# Patient Record
Sex: Female | Born: 1960
Health system: Southern US, Community
[De-identification: ages and names within clinical notes are randomized; demographics above are authoritative.]

## PROBLEM LIST (undated history)

## (undated) ENCOUNTER — Emergency Department (HOSPITAL_COMMUNITY): Source: Home / Self Care

## (undated) DIAGNOSIS — T7840XA Allergy, unspecified, initial encounter: Secondary | ICD-10-CM

## (undated) DIAGNOSIS — O2230 Deep phlebothrombosis in pregnancy, unspecified trimester: Secondary | ICD-10-CM

## (undated) DIAGNOSIS — F419 Anxiety disorder, unspecified: Secondary | ICD-10-CM

## (undated) DIAGNOSIS — G43909 Migraine, unspecified, not intractable, without status migrainosus: Secondary | ICD-10-CM

## (undated) DIAGNOSIS — R251 Tremor, unspecified: Secondary | ICD-10-CM

## (undated) DIAGNOSIS — F319 Bipolar disorder, unspecified: Secondary | ICD-10-CM

## (undated) DIAGNOSIS — I4891 Unspecified atrial fibrillation: Secondary | ICD-10-CM

## (undated) DIAGNOSIS — K219 Gastro-esophageal reflux disease without esophagitis: Secondary | ICD-10-CM

## (undated) DIAGNOSIS — R58 Hemorrhage, not elsewhere classified: Secondary | ICD-10-CM

## (undated) DIAGNOSIS — E785 Hyperlipidemia, unspecified: Secondary | ICD-10-CM

## (undated) DIAGNOSIS — I82409 Acute embolism and thrombosis of unspecified deep veins of unspecified lower extremity: Secondary | ICD-10-CM

## (undated) DIAGNOSIS — D571 Sickle-cell disease without crisis: Secondary | ICD-10-CM

## (undated) DIAGNOSIS — F32A Depression, unspecified: Secondary | ICD-10-CM

## (undated) HISTORY — DX: Unspecified atrial fibrillation: I48.91

## (undated) HISTORY — PX: OVARIAN CYST REMOVAL: SHX89

## (undated) HISTORY — DX: Bipolar disorder, unspecified: F31.9

## (undated) HISTORY — DX: Sickle-cell disease without crisis: D57.1

## (undated) HISTORY — DX: Hyperlipidemia, unspecified: E78.5

## (undated) HISTORY — DX: Tremor, unspecified: R25.1

## (undated) HISTORY — DX: Anxiety disorder, unspecified: F41.9

## (undated) HISTORY — PX: TONSILLECTOMY: SUR1361

## (undated) HISTORY — DX: Hemorrhage, not elsewhere classified: R58

## (undated) HISTORY — DX: Acute embolism and thrombosis of unspecified deep veins of unspecified lower extremity: I82.409

## (undated) HISTORY — DX: Gastro-esophageal reflux disease without esophagitis: K21.9

## (undated) HISTORY — DX: Allergy, unspecified, initial encounter: T78.40XA

## (undated) HISTORY — PX: CHOLECYSTECTOMY: SHX55

---

## 1999-03-13 DIAGNOSIS — I2699 Other pulmonary embolism without acute cor pulmonale: Secondary | ICD-10-CM

## 1999-03-13 HISTORY — DX: Other pulmonary embolism without acute cor pulmonale: I26.99

## 2003-12-14 DIAGNOSIS — M2240 Chondromalacia patellae, unspecified knee: Secondary | ICD-10-CM

## 2003-12-14 HISTORY — DX: Chondromalacia patellae, unspecified knee: M22.40

## 2006-05-01 DIAGNOSIS — D573 Sickle-cell trait: Secondary | ICD-10-CM | POA: Insufficient documentation

## 2008-02-03 DIAGNOSIS — Z7901 Long term (current) use of anticoagulants: Secondary | ICD-10-CM | POA: Insufficient documentation

## 2008-12-28 DIAGNOSIS — R45851 Suicidal ideations: Secondary | ICD-10-CM | POA: Insufficient documentation

## 2008-12-28 HISTORY — DX: Suicidal ideations: R45.851

## 2012-03-12 HISTORY — PX: CHOLECYSTECTOMY: SHX55

## 2012-09-11 DIAGNOSIS — Z9049 Acquired absence of other specified parts of digestive tract: Secondary | ICD-10-CM | POA: Insufficient documentation

## 2012-09-11 HISTORY — DX: Acquired absence of other specified parts of digestive tract: Z90.49

## 2013-03-12 LAB — HM HEPATITIS C SCREENING LAB: HM Hepatitis Screen: NEGATIVE

## 2014-08-18 DIAGNOSIS — G43719 Chronic migraine without aura, intractable, without status migrainosus: Secondary | ICD-10-CM | POA: Insufficient documentation

## 2016-06-06 DIAGNOSIS — K209 Esophagitis, unspecified without bleeding: Secondary | ICD-10-CM | POA: Insufficient documentation

## 2016-06-06 HISTORY — DX: Esophagitis, unspecified without bleeding: K20.90

## 2016-06-29 LAB — HM COLONOSCOPY

## 2019-01-13 ENCOUNTER — Other Ambulatory Visit: Payer: Self-pay | Admitting: Internal Medicine

## 2019-01-13 DIAGNOSIS — Z1231 Encounter for screening mammogram for malignant neoplasm of breast: Secondary | ICD-10-CM

## 2019-03-17 ENCOUNTER — Other Ambulatory Visit: Payer: Self-pay

## 2019-03-17 ENCOUNTER — Ambulatory Visit
Admission: RE | Admit: 2019-03-17 | Discharge: 2019-03-17 | Disposition: A | Discharge status: Home / Self Care | Payer: Medicare HMO | Source: Ambulatory Visit | Attending: Internal Medicine | Admitting: Internal Medicine

## 2019-03-17 DIAGNOSIS — Z1231 Encounter for screening mammogram for malignant neoplasm of breast: Secondary | ICD-10-CM

## 2019-03-18 DIAGNOSIS — G43919 Migraine, unspecified, intractable, without status migrainosus: Secondary | ICD-10-CM | POA: Diagnosis not present

## 2019-03-19 ENCOUNTER — Ambulatory Visit: Payer: Medicare HMO | Admitting: Neurology

## 2019-03-19 ENCOUNTER — Ambulatory Visit: Payer: Medicare PPO | Admitting: Neurology

## 2019-03-19 DIAGNOSIS — F4312 Post-traumatic stress disorder, chronic: Secondary | ICD-10-CM | POA: Diagnosis not present

## 2019-03-19 DIAGNOSIS — F3181 Bipolar II disorder: Secondary | ICD-10-CM | POA: Diagnosis not present

## 2019-04-06 DIAGNOSIS — L814 Other melanin hyperpigmentation: Secondary | ICD-10-CM | POA: Diagnosis not present

## 2019-04-06 DIAGNOSIS — L648 Other androgenic alopecia: Secondary | ICD-10-CM | POA: Diagnosis not present

## 2019-04-16 DIAGNOSIS — G43109 Migraine with aura, not intractable, without status migrainosus: Secondary | ICD-10-CM | POA: Diagnosis not present

## 2019-04-16 DIAGNOSIS — F3181 Bipolar II disorder: Secondary | ICD-10-CM | POA: Diagnosis not present

## 2019-04-16 DIAGNOSIS — F4312 Post-traumatic stress disorder, chronic: Secondary | ICD-10-CM | POA: Diagnosis not present

## 2019-04-27 ENCOUNTER — Ambulatory Visit: Payer: Medicare HMO | Admitting: Neurology

## 2019-05-07 DIAGNOSIS — F3181 Bipolar II disorder: Secondary | ICD-10-CM | POA: Diagnosis not present

## 2019-05-07 DIAGNOSIS — F4312 Post-traumatic stress disorder, chronic: Secondary | ICD-10-CM | POA: Diagnosis not present

## 2019-05-12 DIAGNOSIS — Z20828 Contact with and (suspected) exposure to other viral communicable diseases: Secondary | ICD-10-CM | POA: Diagnosis not present

## 2019-05-20 ENCOUNTER — Ambulatory Visit: Payer: Medicare HMO | Admitting: Neurology

## 2019-05-21 DIAGNOSIS — G43109 Migraine with aura, not intractable, without status migrainosus: Secondary | ICD-10-CM | POA: Diagnosis not present

## 2019-05-21 DIAGNOSIS — F3181 Bipolar II disorder: Secondary | ICD-10-CM | POA: Diagnosis not present

## 2019-05-21 DIAGNOSIS — F4312 Post-traumatic stress disorder, chronic: Secondary | ICD-10-CM | POA: Diagnosis not present

## 2019-06-18 DIAGNOSIS — Z86711 Personal history of pulmonary embolism: Secondary | ICD-10-CM | POA: Diagnosis not present

## 2019-06-18 DIAGNOSIS — D573 Sickle-cell trait: Secondary | ICD-10-CM | POA: Diagnosis not present

## 2019-06-18 DIAGNOSIS — H43399 Other vitreous opacities, unspecified eye: Secondary | ICD-10-CM | POA: Diagnosis not present

## 2019-06-18 DIAGNOSIS — Z1231 Encounter for screening mammogram for malignant neoplasm of breast: Secondary | ICD-10-CM | POA: Diagnosis not present

## 2019-06-18 DIAGNOSIS — I480 Paroxysmal atrial fibrillation: Secondary | ICD-10-CM | POA: Diagnosis not present

## 2019-06-18 DIAGNOSIS — Z1389 Encounter for screening for other disorder: Secondary | ICD-10-CM | POA: Diagnosis not present

## 2019-06-18 DIAGNOSIS — G43119 Migraine with aura, intractable, without status migrainosus: Secondary | ICD-10-CM | POA: Diagnosis not present

## 2019-06-18 DIAGNOSIS — Z7901 Long term (current) use of anticoagulants: Secondary | ICD-10-CM | POA: Diagnosis not present

## 2019-06-18 DIAGNOSIS — F319 Bipolar disorder, unspecified: Secondary | ICD-10-CM | POA: Diagnosis not present

## 2019-06-18 DIAGNOSIS — Z136 Encounter for screening for cardiovascular disorders: Secondary | ICD-10-CM | POA: Diagnosis not present

## 2019-06-18 DIAGNOSIS — Z0000 Encounter for general adult medical examination without abnormal findings: Secondary | ICD-10-CM | POA: Diagnosis not present

## 2019-06-22 ENCOUNTER — Ambulatory Visit: Payer: Medicare HMO | Admitting: Neurology

## 2019-06-22 ENCOUNTER — Encounter: Payer: Self-pay | Admitting: Neurology

## 2019-06-25 DIAGNOSIS — F3181 Bipolar II disorder: Secondary | ICD-10-CM | POA: Diagnosis not present

## 2019-06-25 DIAGNOSIS — F4312 Post-traumatic stress disorder, chronic: Secondary | ICD-10-CM | POA: Diagnosis not present

## 2019-07-24 DIAGNOSIS — B351 Tinea unguium: Secondary | ICD-10-CM | POA: Diagnosis not present

## 2019-08-07 ENCOUNTER — Ambulatory Visit: Payer: Medicare HMO | Admitting: Cardiology

## 2019-08-11 DIAGNOSIS — G43109 Migraine with aura, not intractable, without status migrainosus: Secondary | ICD-10-CM | POA: Diagnosis not present

## 2019-08-11 DIAGNOSIS — F4312 Post-traumatic stress disorder, chronic: Secondary | ICD-10-CM | POA: Diagnosis not present

## 2019-08-11 DIAGNOSIS — F3181 Bipolar II disorder: Secondary | ICD-10-CM | POA: Diagnosis not present

## 2019-08-20 DIAGNOSIS — Z86018 Personal history of other benign neoplasm: Secondary | ICD-10-CM | POA: Diagnosis not present

## 2019-08-20 DIAGNOSIS — Z01419 Encounter for gynecological examination (general) (routine) without abnormal findings: Secondary | ICD-10-CM | POA: Diagnosis not present

## 2019-09-02 ENCOUNTER — Ambulatory Visit: Payer: Medicare HMO | Admitting: Cardiology

## 2019-09-10 ENCOUNTER — Ambulatory Visit: Payer: Medicare HMO | Admitting: Cardiology

## 2019-09-10 ENCOUNTER — Other Ambulatory Visit: Payer: Self-pay

## 2019-09-10 ENCOUNTER — Encounter: Payer: Self-pay | Admitting: Cardiology

## 2019-09-10 VITALS — BP 125/77 | HR 48 | Ht 63.0 in | Wt 155.0 lb

## 2019-09-10 DIAGNOSIS — R0781 Pleurodynia: Secondary | ICD-10-CM | POA: Diagnosis not present

## 2019-09-10 DIAGNOSIS — I82409 Acute embolism and thrombosis of unspecified deep veins of unspecified lower extremity: Secondary | ICD-10-CM | POA: Insufficient documentation

## 2019-09-10 DIAGNOSIS — Z8249 Family history of ischemic heart disease and other diseases of the circulatory system: Secondary | ICD-10-CM

## 2019-09-10 DIAGNOSIS — I480 Paroxysmal atrial fibrillation: Secondary | ICD-10-CM | POA: Diagnosis not present

## 2019-09-10 DIAGNOSIS — R001 Bradycardia, unspecified: Secondary | ICD-10-CM

## 2019-09-10 DIAGNOSIS — Z7901 Long term (current) use of anticoagulants: Secondary | ICD-10-CM | POA: Diagnosis not present

## 2019-09-10 DIAGNOSIS — R5383 Other fatigue: Secondary | ICD-10-CM | POA: Diagnosis not present

## 2019-09-10 DIAGNOSIS — Z86711 Personal history of pulmonary embolism: Secondary | ICD-10-CM | POA: Diagnosis not present

## 2019-09-10 DIAGNOSIS — Z86718 Personal history of other venous thrombosis and embolism: Secondary | ICD-10-CM | POA: Diagnosis not present

## 2019-09-10 MED ORDER — METOPROLOL SUCCINATE ER 25 MG PO TB24: 25.0000 mg | ORAL_TABLET | Freq: Every day | ORAL | 0 refills | Status: DC

## 2019-09-10 NOTE — Patient Instructions (Signed)
Hold Beta-Blockers for atleast three days prior to the treadmill stress test.

## 2019-09-10 NOTE — Progress Notes (Signed)
Date:  09/10/2019   ID:  Virgie Dad, DOB 1960/07/19, MRN [id number]  PCP:  Leeroy Cha, MD  Cardiologist:  [name], DO, [facility] (established care 09/10/2019) Former Cardiology Providers: [facility]   REASON FOR CONSULT: Atrial fibrillation  REQUESTING PHYSICIAN:  Leeroy Cha, MD [address] [place],  [place] [address]  Chief Complaint  Patient presents with  . Atrial Fibrillation  . New Patient (Initial Visit)  . Coronary Artery Disease    HPI  Emily Rice is a 59 y.o. female who presents to the office with a chief complaint of " atrial fibrillation." She is referred to the office at the request of Leeroy Cha,*. Patient's past medical history and cardiovascular risk factors include: History of pulmonary embolism (2001), history of DVT x2 (2001-2002), sickle cell trait, bipolar disorder, depression, postmenopausal female.  Patient is referred to the office at the request of her primary care provider for evaluation and management of atrial fibrillation.  Patient states that she was diagnosed with atrial fibrillation back in 2014 while she was hospitalized at [facility] after undergoing cholecystectomy.  She used to follow-up with cardiology back up in [place] [facility] but does not recall the physician's name.  Patient presents today with a concern of low heart rate.  Patient states that she is currently on metoprolol and her heart rate usually runs between 45-50 bpm and at times she has had isolated readings in the 30 bpm range as well.  She feels tired, fatigued, decreased energy.  Review of systems were also positive for chest pain.  Patient states that the last episode was 1 week ago, the discomfort is located over the left breast, last for few seconds, intensity 5 out of 10, intermittent, sharp-like sensation pleuritic in nature, nonradiating and usually self-limited.  Premature coronary artery disease in the  family, per patient brother has had PCIs in his mid 30s.   No history of sudden cardiac death in the family.   Denies prior history of coronary artery disease, myocardial infarction, congestive heart failure, stroke, transient ischemic attack.  FUNCTIONAL STATUS: Walks about an hour for atleast 4-5x / week.    ALLERGIES: Allergies  Allergen Reactions  . Hydromorphone Other (See Comments) and Hives    Stroke like symptoms stroke symptoms. treated with Narcan.  tolerated Percocet in the past. stroke symptoms. treated with Narcan.  tolerated Percocet in the past. Stroke like symptoms   . Iodinated Diagnostic Agents   . Prochlorperazine Swelling    compazine-Tongue swelling compazine-Tongue swelling   . Prochlorperazine Edisylate Swelling    MEDICATION LIST PRIOR TO VISIT: Current Meds  Medication Sig  . ALPRAZolam (XANAX) 1 MG tablet Take 1 mg by mouth 3 (three) times daily as needed.  Marland Kitchen apixaban (ELIQUIS) 5 MG TABS tablet Take 1 tablet by mouth in the morning and at bedtime.  Marland Kitchen buPROPion (WELLBUTRIN XL) 300 MG 24 hr tablet Take 300 mg by mouth every morning.  . cetirizine (ZYRTEC) 10 MG tablet Take 10 mg by mouth daily.  . Cholecalciferol 75 MCG (3000 UT) TABS Take 1 tablet by mouth daily.  . clonazePAM (KLONOPIN) 0.5 MG tablet Take 1 tablet by mouth as needed.  . diphenhydrAMINE (BENADRYL) 25 mg capsule Take 25 mg by mouth every 6 (six) hours as needed.  . fluticasone (FLONASE) 50 MCG/ACT nasal spray Place 1 spray into both nostrils daily.  . INGREZZA 40 MG CAPS Take 2 capsules by mouth daily.  Marland Kitchen lamoTRIgine (LAMICTAL) 100 MG tablet  Take 100 mg by mouth in the morning, at noon, and at bedtime.  . Multiple Vitamin (MULTI-VITAMIN) tablet Take 1 tablet by mouth daily.  . naratriptan (AMERGE) 2.5 MG tablet Take 1 tablet by mouth as needed.  . ondansetron (ZOFRAN) 4 MG tablet Take 1 tablet by mouth every 8 (eight) hours as needed.  . traZODone (DESYREL) 100 MG tablet Take 100  mg by mouth as needed.  . [DISCONTINUED] metoprolol tartrate (LOPRESSOR) 25 MG tablet Take 25 mg by mouth in the morning and at bedtime.     PAST MEDICAL HISTORY: Past Medical History:  Diagnosis Date  . A-fib ([place])   . Anxiety   . Bipolar disorder ([place])   . DVT (deep venous thrombosis) ([place]) 2001 and 2002  . Pulmonary embolism ([place]) 2001    PAST SURGICAL HISTORY: Past Surgical History:  Procedure Laterality Date  . CHOLECYSTECTOMY  2014  . OVARIAN CYST REMOVAL    . TONSILLECTOMY      FAMILY HISTORY: The patient family history includes Atrial fibrillation in her father; CAD in her brother; Diabetes in her sister; Hypertension in her father, maternal uncle, and mother; Stroke in her father.  SOCIAL HISTORY:  The patient  reports that she has never smoked. She has never used smokeless tobacco. She reports previous alcohol use.  REVIEW OF SYSTEMS: Review of Systems  Constitutional: Positive for malaise/fatigue. Negative for chills and fever.  HENT: Negative for hoarse voice and nosebleeds.   Eyes: Negative for discharge, double vision and pain.  Cardiovascular: Positive for chest pain and dyspnea on exertion. Negative for claudication, leg swelling, near-syncope, orthopnea, palpitations, paroxysmal nocturnal dyspnea and syncope.  Respiratory: Negative for hemoptysis and shortness of breath.   Musculoskeletal: Negative for muscle cramps and myalgias.  Gastrointestinal: Negative for abdominal pain, constipation, diarrhea, hematemesis, hematochezia, melena, nausea and vomiting.  Neurological: Negative for dizziness and light-headedness.    PHYSICAL EXAM: Vitals with BMI 09/10/2019  Height 5\' 3"   Weight 155 lbs  BMI 65.03  Systolic 546  Diastolic 77  Pulse 48   CONSTITUTIONAL: Well-developed and well-nourished. No acute distress.  SKIN: Skin is warm and dry. No rash noted. No cyanosis. No pallor. No jaundice HEAD: Normocephalic and atraumatic.  EYES: No scleral  icterus MOUTH/THROAT: Moist oral membranes.  NECK: No JVD present. No thyromegaly noted. No carotid bruits  LYMPHATIC: No visible cervical adenopathy.  CHEST Normal respiratory effort. No intercostal retractions  LUNGS: Clear to auscultation bilaterally.  No stridor. No wheezes. No rales.  CARDIOVASCULAR: Regular, bradycardic, positive S1-S2, no murmurs rubs or gallops appreciated. ABDOMINAL: Nonobese, soft, nontender, nondistended, positive bowel sounds in all 4 quadrants, no apparent ascites.  EXTREMITIES: No peripheral edema  HEMATOLOGIC: No significant bruising NEUROLOGIC: Oriented to person, place, and time. Nonfocal. Normal muscle tone.  PSYCHIATRIC: Normal mood and affect. Normal behavior. Cooperative  CARDIAC DATABASE: EKG: 09/10/2019: Marked sinus  Bradycardia, 45bpm, normal axis, PRWP, TWI anteroseptal leads consider ischemia. No prior ECGs for comparison.   Echocardiogram: NA  Stress Testing: NA  Heart Catheterization: Per patient she had LHC at [facility] atleast 5 years ago.   LABORATORY DATA: No flowsheet data found.  No flowsheet data found.  Lipid Panel  No results found for: CHOL, TRIG, HDL, CHOLHDL, VLDL, LDLCALC, LDLDIRECT, LABVLDL  No components found for: NTPROBNP No results for input(s): PROBNP in the last 8760 hours. No results for input(s): TSH in the last 8760 hours.  BMP No results for input(s): NA, K, CL, CO2, GLUCOSE, BUN, CREATININE, CALCIUM,  GFRNONAA, GFRAA in the last 8760 hours.  HEMOGLOBIN A1C No results found for: HGBA1C, MPG  IMPRESSION:    ICD-10-CM   1. Paroxysmal atrial fibrillation (HCC)  I48.0 EKG 12-Lead    metoprolol succinate (TOPROL XL) 25 MG 24 hr tablet    PCV CARDIAC STRESS TEST    PCV ECHOCARDIOGRAM COMPLETE  2. Long term (current) use of anticoagulants  Z79.01   3. Pleuritic chest pain  R07.81   4. Feeling tired  R53.83   5. Bradycardia  R00.1   6. Family history of premature coronary artery disease  Z82.49    7. History of DVT (deep vein thrombosis)  Z86.718   8. History of pulmonary embolism  Z86.711      RECOMMENDATIONS: Emily Rice is a 59 y.o. female whose past medical history and cardiac risk factors include: History of pulmonary embolism (2001), history of DVT x2 (2001-2002), sickle cell trait, bipolar disorder, depression, postmenopausal female.  Paroxysmal atrial fibrillation: Per patient, diagnosed in 2014 at [facility].  Currently treated with rate control medications such as Lopressor.  Patient is bradycardic and appears to be symptomatic.  Will decrease beta-blockers to Toprol-XL 25 mg p.o. daily.  CHA2DS2-VASc SCORE is 1 which correlates to 1.3% risk of stroke per year.  She is already on oral anticoagulation given her history of PE and 2 isolated episodes of DVT. Echocardiogram will be ordered to evaluate for structural heart disease and left ventricular systolic function. Plan exercise stress test to evaluate for exercise-induced ischemia and chronotropic competence.  Patient is asked to hold beta-blocker therapy 3 days prior to her GXT.  Long-term oral anticoagulation: Indication history of DVT and PE.  Does not endorse any evidence of bleeding.  Precordial chest pain: Patient symptoms of chest discomfort appears to be pleuritic in nature.  However, given her risk factors and EKG findings recommended exercise treadmill stress test.  Echocardiogram will be ordered to evaluate for structural heart disease and left ventricular systolic function.  Bradycardia: Symptomatic. Most likely medication induced.  Decrease beta-blocker therapy to Toprol-XL 25 mg p.o. daily.  Currently no symptoms of near syncope or syncope.  Continue to monitor.  FINAL MEDICATION LIST END OF ENCOUNTER: Meds ordered this encounter  Medications  . metoprolol succinate (TOPROL XL) 25 MG 24 hr tablet    Sig: Take 1 tablet (25 mg total) by mouth daily. Hold if systolic blood pressure (top blood  pressure number) less than 100 mmHg or heart rate less than 60 bpm (pulse).    Dispense:  30 tablet    Refill:  0    Medications Discontinued During This Encounter  Medication Reason  . metoprolol tartrate (LOPRESSOR) 25 MG tablet Change in therapy     Current Outpatient Medications:  .  ALPRAZolam (XANAX) 1 MG tablet, Take 1 mg by mouth 3 (three) times daily as needed., Disp: , Rfl:  .  apixaban (ELIQUIS) 5 MG TABS tablet, Take 1 tablet by mouth in the morning and at bedtime., Disp: , Rfl:  .  buPROPion (WELLBUTRIN XL) 300 MG 24 hr tablet, Take 300 mg by mouth every morning., Disp: , Rfl:  .  cetirizine (ZYRTEC) 10 MG tablet, Take 10 mg by mouth daily., Disp: , Rfl:  .  Cholecalciferol 75 MCG (3000 UT) TABS, Take 1 tablet by mouth daily., Disp: , Rfl:  .  clonazePAM (KLONOPIN) 0.5 MG tablet, Take 1 tablet by mouth as needed., Disp: , Rfl:  .  diphenhydrAMINE (BENADRYL) 25 mg capsule, Take 25  mg by mouth every 6 (six) hours as needed., Disp: , Rfl:  .  fluticasone (FLONASE) 50 MCG/ACT nasal spray, Place 1 spray into both nostrils daily., Disp: , Rfl:  .  INGREZZA 40 MG CAPS, Take 2 capsules by mouth daily., Disp: , Rfl:  .  lamoTRIgine (LAMICTAL) 100 MG tablet, Take 100 mg by mouth in the morning, at noon, and at bedtime., Disp: , Rfl:  .  Multiple Vitamin (MULTI-VITAMIN) tablet, Take 1 tablet by mouth daily., Disp: , Rfl:  .  naratriptan (AMERGE) 2.5 MG tablet, Take 1 tablet by mouth as needed., Disp: , Rfl:  .  ondansetron (ZOFRAN) 4 MG tablet, Take 1 tablet by mouth every 8 (eight) hours as needed., Disp: , Rfl:  .  traZODone (DESYREL) 100 MG tablet, Take 100 mg by mouth as needed., Disp: , Rfl:  .  metoprolol succinate (TOPROL XL) 25 MG 24 hr tablet, Take 1 tablet (25 mg total) by mouth daily. Hold if systolic blood pressure (top blood pressure number) less than 100 mmHg or heart rate less than 60 bpm (pulse)., Disp: 30 tablet, Rfl: 0  Orders Placed This Encounter  Procedures  . PCV  CARDIAC STRESS TEST  . EKG 12-Lead  . PCV ECHOCARDIOGRAM COMPLETE    Patient Instructions  Hold Beta-Blockers for atleast three days prior to the treadmill stress test.    --Continue cardiac medications as reconciled in final medication list. --Return in about 6 weeks (around 10/22/2019) for afib follow up.. Or sooner if needed. --Continue follow-up with your primary care physician regarding the management of your other chronic comorbid conditions.  Patient's questions and concerns were addressed to her satisfaction. She voices understanding of the instructions provided during this encounter.   This note was created using a voice recognition software as a result there may be grammatical errors inadvertently enclosed that do not reflect the nature of this encounter. Every attempt is made to correct such errors.  [name], [place], [facility]  Pager: [phone] Office: [phone]

## 2019-09-28 ENCOUNTER — Ambulatory Visit: Payer: Medicare HMO

## 2019-09-28 ENCOUNTER — Other Ambulatory Visit: Payer: Self-pay

## 2019-09-29 ENCOUNTER — Other Ambulatory Visit: Payer: Medicare HMO

## 2019-10-02 ENCOUNTER — Other Ambulatory Visit: Payer: Self-pay | Admitting: Cardiology

## 2019-10-02 DIAGNOSIS — I480 Paroxysmal atrial fibrillation: Secondary | ICD-10-CM

## 2019-10-05 ENCOUNTER — Other Ambulatory Visit: Payer: Self-pay

## 2019-10-05 ENCOUNTER — Ambulatory Visit: Payer: Medicare HMO

## 2019-10-05 DIAGNOSIS — I480 Paroxysmal atrial fibrillation: Secondary | ICD-10-CM | POA: Diagnosis not present

## 2019-10-06 ENCOUNTER — Other Ambulatory Visit: Payer: Self-pay

## 2019-10-06 DIAGNOSIS — I480 Paroxysmal atrial fibrillation: Secondary | ICD-10-CM

## 2019-10-06 MED ORDER — METOPROLOL SUCCINATE ER 25 MG PO TB24: 25.0000 mg | ORAL_TABLET | Freq: Every day | ORAL | 3 refills | Status: DC

## 2019-10-22 ENCOUNTER — Ambulatory Visit: Payer: Medicare HMO | Admitting: Cardiology

## 2019-10-22 ENCOUNTER — Encounter: Payer: Self-pay | Admitting: Cardiology

## 2019-10-22 ENCOUNTER — Other Ambulatory Visit: Payer: Self-pay

## 2019-10-22 VITALS — BP 138/81 | HR 46 | Resp 17 | Ht 63.0 in | Wt 156.0 lb

## 2019-10-22 DIAGNOSIS — I480 Paroxysmal atrial fibrillation: Secondary | ICD-10-CM

## 2019-10-22 DIAGNOSIS — Z7901 Long term (current) use of anticoagulants: Secondary | ICD-10-CM

## 2019-10-22 DIAGNOSIS — Z712 Person consulting for explanation of examination or test findings: Secondary | ICD-10-CM

## 2019-10-22 DIAGNOSIS — Z86711 Personal history of pulmonary embolism: Secondary | ICD-10-CM

## 2019-10-22 DIAGNOSIS — Z86718 Personal history of other venous thrombosis and embolism: Secondary | ICD-10-CM

## 2019-10-22 DIAGNOSIS — Z8249 Family history of ischemic heart disease and other diseases of the circulatory system: Secondary | ICD-10-CM | POA: Diagnosis not present

## 2019-10-22 DIAGNOSIS — R001 Bradycardia, unspecified: Secondary | ICD-10-CM | POA: Diagnosis not present

## 2019-10-22 MED ORDER — METOPROLOL SUCCINATE ER 25 MG PO TB24: 12.5000 mg | ORAL_TABLET | Freq: Every morning | ORAL | 0 refills | Status: DC

## 2019-10-22 NOTE — Progress Notes (Signed)
Date:  10/22/2019   ID:  Emily Rice, DOB [birth year], MRN [id number]  PCP:  Leeroy Cha, MD  Cardiologist:  [name], DO, [facility] (established care 09/10/2019) Former Cardiology Providers: [facility] clinic   Date: 10/22/19 Last Office Visit: 09/10/2019  Chief Complaint  Patient presents with  . Atrial Fibrillation  . Follow-up    6 week  . Results    HPI  Emily Rice is a 59 y.o. female who presents to the office with a chief complaint of " atrial fibrillation on review test results."  Patient's past medical history and cardiovascular risk factors include: History of pulmonary embolism (2001), history of DVT x2 (2001-2002), sickle cell trait, bipolar disorder, depression, paroxysmal atrial fibrillation, postmenopausal female.  Patient is referred to the office at the request of her primary care provider for evaluation and management of atrial fibrillation.  Patient states that she was diagnosed with atrial fibrillation back in 2014 while she was hospitalized at [facility] after undergoing cholecystectomy.  She used to follow-up with cardiology back up in [place] clinic but does not recall the physician's name.    At the last office visit patient was concerned of a low heart rate. In the past she is noted her heart rate to be as low as 30 bpm but usually averages between 45-50 bpm. She was also feeling tired, fatigue, and decreased energy. As a result we decided to undergo exercise stress test to evaluate for underlying exercise-induced ischemia and chronotropic competence. With exercise patient did achieve 94% of maximum predicted heart rate and stress ECG was negative for ischemia. Her LVEF is also preserved.   At the last office visit we transitioned her from Lopressor to Toprol-XL. Patient states that her symptoms continue but her pulse has ranged between 45-55 bpm. She is no longer having readings in 30 bpm.   Premature coronary artery disease in the  family, per patient brother has had PCIs in his mid 2s.   No history of sudden cardiac death in the family.   Denies prior history of coronary artery disease, myocardial infarction, congestive heart failure, stroke, transient ischemic attack.  FUNCTIONAL STATUS: Walks about an hour for atleast 4-5x / week.    ALLERGIES: Allergies  Allergen Reactions  . Hydromorphone Other (See Comments) and Hives    Stroke like symptoms stroke symptoms. treated with Narcan.  tolerated Percocet in the past. stroke symptoms. treated with Narcan.  tolerated Percocet in the past. Stroke like symptoms   . Iodinated Diagnostic Agents   . Prochlorperazine Swelling    compazine-Tongue swelling compazine-Tongue swelling   . Prochlorperazine Edisylate Swelling    MEDICATION LIST PRIOR TO VISIT: Current Meds  Medication Sig  . ALPRAZolam (XANAX) 1 MG tablet Take 1 mg by mouth 3 (three) times daily as needed.  Marland Kitchen apixaban (ELIQUIS) 5 MG TABS tablet Take 1 tablet by mouth in the morning and at bedtime.  Marland Kitchen buPROPion (WELLBUTRIN XL) 300 MG 24 hr tablet Take 300 mg by mouth every morning.  . cetirizine (ZYRTEC) 10 MG tablet Take 10 mg by mouth daily.  . Cholecalciferol 75 MCG (3000 UT) TABS Take 1 tablet by mouth daily.  . diphenhydrAMINE (BENADRYL) 25 mg capsule Take 25 mg by mouth every 6 (six) hours as needed.  . fluticasone (FLONASE) 50 MCG/ACT nasal spray Place 1 spray into both nostrils daily.  . INGREZZA 40 MG CAPS Take 2 capsules by mouth daily.  Marland Kitchen lamoTRIgine (LAMICTAL) 100 MG tablet Take 100 mg by mouth in  the morning, at noon, and at bedtime.  . Multiple Vitamin (MULTI-VITAMIN) tablet Take 1 tablet by mouth daily.  . naratriptan (AMERGE) 2.5 MG tablet Take 1 tablet by mouth as needed.  . traZODone (DESYREL) 100 MG tablet Take 100 mg by mouth as needed.  . [DISCONTINUED] metoprolol succinate (TOPROL-XL) 25 MG 24 hr tablet Take 1 tablet (25 mg total) by mouth daily. Hold if systolic blood  pressure (top blood pressure number) less than 100 mmHg or heart rate less than 60 bpm (pulse).     PAST MEDICAL HISTORY: Past Medical History:  Diagnosis Date  . A-fib ([place])   . Anxiety   . Bipolar disorder ([place])   . DVT (deep venous thrombosis) ([place]) 2001 and 2002  . Pulmonary embolism ([place]) 2001    PAST SURGICAL HISTORY: Past Surgical History:  Procedure Laterality Date  . CHOLECYSTECTOMY  2014  . OVARIAN CYST REMOVAL    . TONSILLECTOMY      FAMILY HISTORY: The patient family history includes Atrial fibrillation in her father; CAD in her brother; Diabetes in her sister; Hypertension in her father, maternal uncle, and mother; Stroke in her father.  SOCIAL HISTORY:  The patient  reports that she has never smoked. She has never used smokeless tobacco. She reports previous alcohol use.  REVIEW OF SYSTEMS: Review of Systems  Constitutional: Positive for malaise/fatigue. Negative for chills and fever.  HENT: Negative for hoarse voice and nosebleeds.   Eyes: Negative for discharge, double vision and pain.  Cardiovascular: Negative for claudication, dyspnea on exertion, leg swelling, near-syncope, orthopnea, palpitations, paroxysmal nocturnal dyspnea and syncope.  Respiratory: Negative for hemoptysis and shortness of breath.   Musculoskeletal: Negative for muscle cramps and myalgias.  Gastrointestinal: Negative for abdominal pain, constipation, diarrhea, hematemesis, hematochezia, melena, nausea and vomiting.  Neurological: Negative for dizziness and light-headedness.    PHYSICAL EXAM: Vitals with BMI 10/22/2019 09/10/2019  Height 5\' 3"  5\' 3"   Weight 156 lbs 155 lbs  BMI 06.23 76.28  Systolic 315 176  Diastolic 81 77  Pulse 46 48   CONSTITUTIONAL: Well-developed and well-nourished. No acute distress.  SKIN: Skin is warm and dry. No rash noted. No cyanosis. No pallor. No jaundice HEAD: Normocephalic and atraumatic.  EYES: No scleral icterus MOUTH/THROAT: Moist oral  membranes.  NECK: No JVD present. No thyromegaly noted. No carotid bruits  LYMPHATIC: No visible cervical adenopathy.  CHEST Normal respiratory effort. No intercostal retractions  LUNGS: Clear to auscultation bilaterally.  No stridor. No wheezes. No rales.  CARDIOVASCULAR: Regular, bradycardic, positive S1-S2, no murmurs rubs or gallops appreciated. ABDOMINAL: Nonobese, soft, nontender, nondistended, positive bowel sounds in all 4 quadrants, no apparent ascites.  EXTREMITIES: No peripheral edema  HEMATOLOGIC: No significant bruising NEUROLOGIC: Oriented to person, place, and time. Nonfocal. Normal muscle tone.  PSYCHIATRIC: Normal mood and affect. Normal behavior. Cooperative  CARDIAC DATABASE: EKG: 09/10/2019: Marked sinus  Bradycardia, 45bpm, normal axis, PRWP, TWI anteroseptal leads consider ischemia. No prior ECGs for comparison.   Echocardiogram: 10/05/2019:  Normal LV systolic function with visual EF 60-65%. Left ventricle cavity is normal in size. Normal global wall motion. Unable to evaluate diastolic function. Calculated EF 66%.  Left atrial cavity is mildly dilated.  Mild (Grade I) mitral regurgitation.  Mild tricuspid regurgitation.  Mild pulmonic regurgitation.  IVC is dilated with a respiratory response of >50%.  No prior study for comparison.  Stress Testing: Exercise treadmill stress test 10/05/2019:  Exercise treadmill stress test performed using Bruce protocol. Patient reached 10.4 METS, and  94% of age predicted maximum heart rate. Exercise capacity was excellent. No chest pain reported. Normal heart rate and hemodynamic response. Stress EKG revealed no ischemic changes.  Low risk study.   Heart Catheterization: Per patient she had LHC at [facility] atleast 5 years ago.   LABORATORY DATA: No flowsheet data found.  No flowsheet data found.  Lipid Panel  No results found for: CHOL, TRIG, HDL, CHOLHDL, VLDL, LDLCALC, LDLDIRECT, LABVLDL  No components  found for: NTPROBNP No results for input(s): PROBNP in the last 8760 hours. No results for input(s): TSH in the last 8760 hours.  BMP No results for input(s): NA, K, CL, CO2, GLUCOSE, BUN, CREATININE, CALCIUM, GFRNONAA, GFRAA in the last 8760 hours.  HEMOGLOBIN A1C No results found for: HGBA1C, MPG  IMPRESSION:    ICD-10-CM   1. Bradycardia  R00.1 metoprolol succinate (TOPROL XL) 25 MG 24 hr tablet    LONG TERM MONITOR (3-14 DAYS)  2. Paroxysmal atrial fibrillation (HCC)  I48.0   3. Long term (current) use of anticoagulants  Z79.01   4. Family history of premature coronary artery disease  Z82.49   5. History of DVT (deep vein thrombosis)  Z86.718   6. History of pulmonary embolism  Z86.711   7. Encounter to discuss test results  Z71.2      RECOMMENDATIONS: Emily Rice is a 59 y.o. female whose past medical history and cardiac risk factors include: History of pulmonary embolism (2001), history of DVT x2 (2001-2002), sickle cell trait, bipolar disorder, depression, postmenopausal female.  Paroxysmal atrial fibrillation:  Per patient, diagnosed in 2014 at [facility].   At last office visit she was transitioned to Toprol-XL.  CHA2DS2-VASc SCORE is 1 which correlates to 1.3% risk of stroke per year.    She is already on oral anticoagulation given her history of PE and 2 isolated episodes of DVT.  Long-term oral anticoagulation:  Indication history of DVT and PE.  Does not endorse any evidence of bleeding.  Precordial chest pain:  Since last office visit her chest discomfort has resolved.  Echocardiogram notes preserved LVEF without any significant valvular heart disease.  Patient also underwent a GXT which did not show exercise-induced ischemia and had good chronotropic competence.  Patient was reassured of her test results.  Given her history of family premature CAD we did discuss undergoing coronary artery calcification score for further risk  ratification. Patient states that she would like to think about this and discuss it also with her PCP prior to having the screening test. We will discuss it at the next office visit.   Bradycardia: Symptomatic.  Most likely medication induced.    Will decrease beta-blocker therapy to Toprol-XL 12.5 mg p.o. daily.    Currently no symptoms of near syncope or syncope.    We will check a 7-day extended Holter monitor to evaluate for underlying pause and or blocks.  FINAL MEDICATION LIST END OF ENCOUNTER: Meds ordered this encounter  Medications  . metoprolol succinate (TOPROL XL) 25 MG 24 hr tablet    Sig: Take 0.5 tablets (12.5 mg total) by mouth in the morning.    Dispense:  45 tablet    Refill:  0      Current Outpatient Medications:  .  ALPRAZolam (XANAX) 1 MG tablet, Take 1 mg by mouth 3 (three) times daily as needed., Disp: , Rfl:  .  apixaban (ELIQUIS) 5 MG TABS tablet, Take 1 tablet by mouth in the morning and at bedtime., Disp: ,  Rfl:  .  buPROPion (WELLBUTRIN XL) 300 MG 24 hr tablet, Take 300 mg by mouth every morning., Disp: , Rfl:  .  cetirizine (ZYRTEC) 10 MG tablet, Take 10 mg by mouth daily., Disp: , Rfl:  .  Cholecalciferol 75 MCG (3000 UT) TABS, Take 1 tablet by mouth daily., Disp: , Rfl:  .  diphenhydrAMINE (BENADRYL) 25 mg capsule, Take 25 mg by mouth every 6 (six) hours as needed., Disp: , Rfl:  .  fluticasone (FLONASE) 50 MCG/ACT nasal spray, Place 1 spray into both nostrils daily., Disp: , Rfl:  .  INGREZZA 40 MG CAPS, Take 2 capsules by mouth daily., Disp: , Rfl:  .  lamoTRIgine (LAMICTAL) 100 MG tablet, Take 100 mg by mouth in the morning, at noon, and at bedtime., Disp: , Rfl:  .  Multiple Vitamin (MULTI-VITAMIN) tablet, Take 1 tablet by mouth daily., Disp: , Rfl:  .  naratriptan (AMERGE) 2.5 MG tablet, Take 1 tablet by mouth as needed., Disp: , Rfl:  .  traZODone (DESYREL) 100 MG tablet, Take 100 mg by mouth as needed., Disp: , Rfl:  .  metoprolol succinate  (TOPROL XL) 25 MG 24 hr tablet, Take 0.5 tablets (12.5 mg total) by mouth in the morning., Disp: 45 tablet, Rfl: 0  Orders Placed This Encounter  Procedures  . LONG TERM MONITOR (3-14 DAYS)    There are no Patient Instructions on file for this visit.  --Continue cardiac medications as reconciled in final medication list. --Return in about 3 months (around 01/11/2020) for re-evaluation of bradycardia and review holter results. . Or sooner if needed. --Continue follow-up with your primary care physician regarding the management of your other chronic comorbid conditions.  Patient's questions and concerns were addressed to her satisfaction. She voices understanding of the instructions provided during this encounter.   This note was created using a voice recognition software as a result there may be grammatical errors inadvertently enclosed that do not reflect the nature of this encounter. Every attempt is made to correct such errors.  [name], [place], [facility]  Pager: [phone] Office: [phone]

## 2019-10-27 ENCOUNTER — Ambulatory Visit: Payer: Medicare HMO

## 2019-10-27 ENCOUNTER — Other Ambulatory Visit: Payer: Self-pay

## 2019-10-27 DIAGNOSIS — R001 Bradycardia, unspecified: Secondary | ICD-10-CM | POA: Diagnosis not present

## 2019-11-13 DIAGNOSIS — R001 Bradycardia, unspecified: Secondary | ICD-10-CM | POA: Diagnosis not present

## 2019-11-17 DIAGNOSIS — R001 Bradycardia, unspecified: Secondary | ICD-10-CM | POA: Diagnosis not present

## 2019-11-19 DIAGNOSIS — F3181 Bipolar II disorder: Secondary | ICD-10-CM | POA: Diagnosis not present

## 2019-11-19 DIAGNOSIS — G2401 Drug induced subacute dyskinesia: Secondary | ICD-10-CM | POA: Diagnosis not present

## 2019-11-19 DIAGNOSIS — F4312 Post-traumatic stress disorder, chronic: Secondary | ICD-10-CM | POA: Diagnosis not present

## 2019-11-19 DIAGNOSIS — G43109 Migraine with aura, not intractable, without status migrainosus: Secondary | ICD-10-CM | POA: Diagnosis not present

## 2019-11-20 ENCOUNTER — Other Ambulatory Visit: Payer: Self-pay

## 2019-11-20 DIAGNOSIS — R001 Bradycardia, unspecified: Secondary | ICD-10-CM

## 2019-11-20 MED ORDER — METOPROLOL SUCCINATE ER 25 MG PO TB24: 12.5000 mg | ORAL_TABLET | Freq: Every morning | ORAL | 6 refills | Status: DC

## 2019-12-03 ENCOUNTER — Other Ambulatory Visit: Payer: Self-pay

## 2019-12-03 DIAGNOSIS — R001 Bradycardia, unspecified: Secondary | ICD-10-CM

## 2019-12-03 MED ORDER — METOPROLOL SUCCINATE ER 25 MG PO TB24: 12.5000 mg | ORAL_TABLET | Freq: Every morning | ORAL | 6 refills | Status: DC

## 2019-12-09 DIAGNOSIS — G43119 Migraine with aura, intractable, without status migrainosus: Secondary | ICD-10-CM | POA: Diagnosis not present

## 2019-12-09 DIAGNOSIS — I480 Paroxysmal atrial fibrillation: Secondary | ICD-10-CM | POA: Diagnosis not present

## 2019-12-09 DIAGNOSIS — Z86711 Personal history of pulmonary embolism: Secondary | ICD-10-CM | POA: Diagnosis not present

## 2019-12-09 DIAGNOSIS — G252 Other specified forms of tremor: Secondary | ICD-10-CM | POA: Diagnosis not present

## 2019-12-10 ENCOUNTER — Emergency Department (HOSPITAL_COMMUNITY): Payer: Medicare HMO

## 2019-12-10 ENCOUNTER — Other Ambulatory Visit: Payer: Self-pay

## 2019-12-10 ENCOUNTER — Encounter (HOSPITAL_COMMUNITY): Payer: Self-pay | Admitting: *Deleted

## 2019-12-10 ENCOUNTER — Emergency Department (HOSPITAL_COMMUNITY)
Admission: EM | Admit: 2019-12-10 | Discharge: 2019-12-10 | Disposition: A | Discharge status: Home / Self Care | Payer: Medicare HMO | Attending: Emergency Medicine | Admitting: Emergency Medicine

## 2019-12-10 DIAGNOSIS — H538 Other visual disturbances: Secondary | ICD-10-CM | POA: Diagnosis not present

## 2019-12-10 DIAGNOSIS — H532 Diplopia: Secondary | ICD-10-CM | POA: Diagnosis not present

## 2019-12-10 DIAGNOSIS — G259 Extrapyramidal and movement disorder, unspecified: Secondary | ICD-10-CM | POA: Diagnosis not present

## 2019-12-10 DIAGNOSIS — K148 Other diseases of tongue: Secondary | ICD-10-CM | POA: Insufficient documentation

## 2019-12-10 DIAGNOSIS — Z7901 Long term (current) use of anticoagulants: Secondary | ICD-10-CM | POA: Diagnosis not present

## 2019-12-10 DIAGNOSIS — G2401 Drug induced subacute dyskinesia: Secondary | ICD-10-CM | POA: Diagnosis not present

## 2019-12-10 DIAGNOSIS — I4891 Unspecified atrial fibrillation: Secondary | ICD-10-CM | POA: Diagnosis not present

## 2019-12-10 DIAGNOSIS — I10 Essential (primary) hypertension: Secondary | ICD-10-CM | POA: Diagnosis not present

## 2019-12-10 DIAGNOSIS — G2119 Other drug induced secondary parkinsonism: Secondary | ICD-10-CM

## 2019-12-10 DIAGNOSIS — I6782 Cerebral ischemia: Secondary | ICD-10-CM | POA: Diagnosis not present

## 2019-12-10 DIAGNOSIS — G252 Other specified forms of tremor: Secondary | ICD-10-CM | POA: Diagnosis not present

## 2019-12-10 DIAGNOSIS — I82409 Acute embolism and thrombosis of unspecified deep veins of unspecified lower extremity: Secondary | ICD-10-CM | POA: Insufficient documentation

## 2019-12-10 DIAGNOSIS — R1310 Dysphagia, unspecified: Secondary | ICD-10-CM | POA: Diagnosis present

## 2019-12-10 LAB — COMPREHENSIVE METABOLIC PANEL
ALT: 6 U/L (ref 0–44)
AST: 24 U/L (ref 15–41)
Albumin: 4.4 g/dL (ref 3.5–5.0)
Alkaline Phosphatase: 47 U/L (ref 38–126)
Anion gap: 10 (ref 5–15)
BUN: 9 mg/dL (ref 6–20)
CO2: 26 mmol/L (ref 22–32)
Calcium: 9.5 mg/dL (ref 8.9–10.3)
Chloride: 104 mmol/L (ref 98–111)
Creatinine, Ser: 0.98 mg/dL (ref 0.44–1.00)
GFR calc Af Amer: >60 mL/min (ref >60)
GFR calc non Af Amer: >60 mL/min (ref >60)
Glucose, Bld: 93 mg/dL (ref 70–99)
Potassium: 3.9 mmol/L (ref 3.5–5.1)
Sodium: 140 mmol/L (ref 135–145)
Total Bilirubin: 1 mg/dL (ref 0.3–1.2)
Total Protein: 7.2 g/dL (ref 6.5–8.1)

## 2019-12-10 LAB — CBC WITH DIFFERENTIAL/PLATELET
Abs Immature Granulocytes: 0.01 10*3/uL (ref 0.00–0.07)
Basophils Absolute: 0.1 10*3/uL (ref 0.0–0.1)
Basophils Relative: 1 %
Eosinophils Absolute: 0.1 10*3/uL (ref 0.0–0.5)
Eosinophils Relative: 2 %
HCT: 43.7 % (ref 36.0–46.0)
Hemoglobin: 14 g/dL (ref 12.0–15.0)
Immature Granulocytes: 0 %
Lymphocytes Relative: 34 %
Lymphs Abs: 2 10*3/uL (ref 0.7–4.0)
MCH: 26.1 pg (ref 26.0–34.0)
MCHC: 32 g/dL (ref 30.0–36.0)
MCV: 81.5 fL (ref 80.0–100.0)
Monocytes Absolute: 0.5 10*3/uL (ref 0.1–1.0)
Monocytes Relative: 8 %
Neutro Abs: 3.2 10*3/uL (ref 1.7–7.7)
Neutrophils Relative %: 55 %
Platelets: 166 10*3/uL (ref 150–400)
RBC: 5.36 MIL/uL — ABNORMAL HIGH (ref 3.87–5.11)
RDW: 14.4 % (ref 11.5–15.5)
WBC: 5.8 10*3/uL (ref 4.0–10.5)
nRBC: 0 % (ref 0.0–0.2)

## 2019-12-10 MED ORDER — GADOBUTROL 1 MMOL/ML IV SOLN
7.0000 mL | Freq: Once | INTRAVENOUS | Status: AC | PRN
  Administered 2019-12-10: 7 mL via INTRAVENOUS

## 2019-12-10 MED ORDER — DIPHENHYDRAMINE HCL 50 MG/ML IJ SOLN
50.0000 mg | Freq: Once | INTRAMUSCULAR | Status: AC
  Administered 2019-12-10: 50 mg via INTRAVENOUS

## 2019-12-10 MED ORDER — LORAZEPAM 2 MG/ML IJ SOLN
1.0000 mg | Freq: Once | INTRAMUSCULAR | Status: AC
  Administered 2019-12-10: 1 mg via INTRAVENOUS
  Filled 2019-12-10: qty 1

## 2019-12-10 MED ORDER — LORAZEPAM 2 MG/ML IJ SOLN
2.0000 mg | Freq: Once | INTRAMUSCULAR | Status: AC | PRN
  Administered 2019-12-10: 2 mg via INTRAVENOUS
  Filled 2019-12-10: qty 1

## 2019-12-10 MED ORDER — BENZTROPINE MESYLATE 1 MG/ML IJ SOLN
1.0000 mg | Freq: Once | INTRAMUSCULAR | Status: AC
  Administered 2019-12-10: 1 mg via INTRAVENOUS
  Filled 2019-12-10: qty 1

## 2019-12-10 MED ORDER — LORAZEPAM 2 MG/ML IJ SOLN
1.0000 mg | Freq: Once | INTRAMUSCULAR | Status: AC | PRN
  Administered 2019-12-10: 1 mg via INTRAVENOUS
  Filled 2019-12-10: qty 1

## 2019-12-10 MED ORDER — CARBIDOPA-LEVODOPA 25-100 MG PO TABS: 1.0000 | ORAL_TABLET | Freq: Three times a day (TID) | ORAL | 0 refills | Status: DC

## 2019-12-10 MED ORDER — DIPHENHYDRAMINE HCL 25 MG PO CAPS
50.0000 mg | ORAL_CAPSULE | Freq: Once | ORAL | Status: AC
  Administered 2019-12-10: 50 mg via ORAL
  Filled 2019-12-10: qty 2

## 2019-12-10 MED ORDER — CARBIDOPA-LEVODOPA 25-100 MG PO TABS
1.0000 | ORAL_TABLET | Freq: Once | ORAL | Status: AC
  Administered 2019-12-10: 1 via ORAL
  Filled 2019-12-10: qty 1

## 2019-12-10 NOTE — ED Notes (Signed)
Patient verbalizes understanding of discharge instructions. Opportunity for questioning and answers were provided. Armband removed by staff, pt discharged from ED ambulatory to home.  

## 2019-12-10 NOTE — ED Provider Notes (Signed)
[place] EMERGENCY DEPARTMENT Provider Note   CSN: [id number] Arrival date & time: 12/10/19  0547     History Chief Complaint  Patient presents with  . difficulty swallowing    Emily Rice is a 59 y.o. female.  Patient with history of tardive dyskinesia, thought due to a previous medication for bipolar disorder that has now been discontinued, treated with Ingrezza starting around June, amantadine as well in interim , h/o afib/DVT/PE on Eliquis -- presents with involuntary movements in her left arm and leg, intermittent episodes of blurred vision, teeth chattering, difficulty swallowing, sensation to tongue swelling.  The mouth movements and teeth chattering is not new.  Previous to 4 days ago, patient did have jerking motions in her arms and legs which would be intermittent.  4 days ago she began with constant rhythmic motions in her left arm and leg.  She also has intermittent blurry vision without weakness, slurred speech, difficulty walking.  She denies loss of vision or black spots in her vision.  Last episode of this was while in triage, currently vision is normal.  She endorses occasional double vision.  No recent symptoms of illness including fever, nausea, vomiting, diarrhea.  The onset of this condition was acute. The course is constant. Aggravating factors: none. Alleviating factors: none.          Past Medical History:  Diagnosis Date  . A-fib ([place])   . Anxiety   . Bipolar disorder ([place])   . DVT (deep venous thrombosis) ([place]) 2001 and 2002  . Pulmonary embolism [facility]) 2001    Patient Active Problem List   Diagnosis Date Noted  . DVT (deep venous thrombosis) ([place])     Past Surgical History:  Procedure Laterality Date  . CHOLECYSTECTOMY  2014  . OVARIAN CYST REMOVAL    . TONSILLECTOMY       OB History   No obstetric history on file.     Family History  Problem Relation Age of Onset  . Hypertension Mother   . Stroke Father   .  Hypertension Father   . Atrial fibrillation Father   . Diabetes Sister   . CAD Brother   . Hypertension Maternal Uncle     Social History   Tobacco Use  . Smoking status: Never Smoker  . Smokeless tobacco: Never Used  Vaping Use  . Vaping Use: Never used  Substance Use Topics  . Alcohol use: Not Currently  . Drug use: Not on file    Home Medications Prior to Admission medications   Medication Sig Start Date End Date Taking? Authorizing Provider  ALPRAZolam Duanne Moron) 1 MG tablet Take 1 mg by mouth 3 (three) times daily as needed. 08/11/19   [provider]  apixaban (ELIQUIS) 5 MG TABS tablet Take 1 tablet by mouth in the morning and at bedtime. 07/23/18   [provider]  buPROPion (WELLBUTRIN XL) 300 MG 24 hr tablet Take 300 mg by mouth every morning. 07/08/19   [provider]  cetirizine (ZYRTEC) 10 MG tablet Take 10 mg by mouth daily.    [provider]  Cholecalciferol 75 MCG (3000 UT) TABS Take 1 tablet by mouth daily.    [provider]  diphenhydrAMINE (BENADRYL) 25 mg capsule Take 25 mg by mouth every 6 (six) hours as needed.    [provider]  fluticasone (FLONASE) 50 MCG/ACT nasal spray Place 1 spray into both nostrils daily.    [provider]  INGREZZA 40 MG  CAPS Take 2 capsules by mouth daily. 08/12/19   [provider]  lamoTRIgine (LAMICTAL) 100 MG tablet Take 100 mg by mouth in the morning, at noon, and at bedtime. 07/23/18   [provider]  metoprolol succinate (TOPROL XL) 25 MG 24 hr tablet Take 0.5 tablets (12.5 mg total) by mouth in the morning. 12/03/19   Tolia, Sunit, DO  Multiple Vitamin (MULTI-VITAMIN) tablet Take 1 tablet by mouth daily.    [provider]  naratriptan (AMERGE) 2.5 MG tablet Take 1 tablet by mouth as needed. 12/10/18   [provider]  traZODone (DESYREL) 100 MG tablet Take 100 mg by mouth as needed. 06/03/18   [provider]    Allergies     Hydromorphone, Iodinated diagnostic agents, Prochlorperazine, and Prochlorperazine edisylate  Review of Systems   Review of Systems  Constitutional: Negative for fever.  HENT: Positive for trouble swallowing. Negative for rhinorrhea and sore throat.   Eyes: Positive for visual disturbance. Negative for redness.  Respiratory: Negative for cough.   Cardiovascular: Negative for chest pain.  Gastrointestinal: Negative for abdominal pain, diarrhea, nausea and vomiting.  Genitourinary: Negative for dysuria, frequency, hematuria and urgency.  Musculoskeletal: Negative for myalgias.  Skin: Negative for rash.  Neurological: Positive for tremors. Negative for numbness and headaches.    Physical Exam Updated Vital Signs BP 137/79 (BP Location: Right Arm)   Pulse 60   Temp 98.3 F (36.8 C)   Resp 16   Ht 5\' 3"  (1.6 m)   Wt 70.8 kg   SpO2 99%   BMI 27.65 kg/m   Physical Exam Vitals and nursing note reviewed.  Constitutional:      General: She is not in acute distress.    Appearance: She is well-developed.  HENT:     Head: Normocephalic and atraumatic.     Right Ear: External ear normal.     Left Ear: External ear normal.     Nose: Nose normal.  Eyes:     Conjunctiva/sclera: Conjunctivae normal.  Cardiovascular:     Rate and Rhythm: Normal rate and regular rhythm.     Heart sounds: No murmur heard.   Pulmonary:     Effort: No respiratory distress.     Breath sounds: No wheezing, rhonchi or rales.  Abdominal:     Palpations: Abdomen is soft.     Tenderness: There is no abdominal tenderness. There is no guarding or rebound.  Musculoskeletal:     Cervical back: Normal range of motion and neck supple.     Right lower leg: No edema.     Left lower leg: No edema.  Skin:    General: Skin is warm and dry.     Findings: No rash.  Neurological:     General: No focal deficit present.     Mental Status: She is alert. Mental status is at baseline.     Cranial Nerves: No facial  asymmetry.     Motor: Tremor present. No weakness.     Comments: Rhythmic shaking on left arm and left leg. She can control it with volition, otherwise constant. Also rhythmic motion of face and lips bilaterally.   Psychiatric:        Mood and Affect: Mood normal.     ED Results / Procedures / Treatments   Labs (all labs ordered are listed, but only abnormal results are displayed) Labs Reviewed  CBC WITH DIFFERENTIAL/PLATELET  COMPREHENSIVE METABOLIC PANEL    EKG None  Radiology No results  found.  Procedures Procedures (including critical care time)  Medications Ordered in ED Medications  LORazepam (ATIVAN) injection 2 mg (has no administration in time range)  diphenhydrAMINE (BENADRYL) capsule 50 mg (50 mg Oral Given by Other 12/10/19 0943)  benztropine mesylate (COGENTIN) injection 1 mg (1 mg Intravenous Given 12/10/19 1151)  LORazepam (ATIVAN) injection 1 mg (1 mg Intravenous Given 12/10/19 1243)  LORazepam (ATIVAN) injection 1 mg (1 mg Intravenous Given 12/10/19 1416)  carbidopa-levodopa (SINEMET IR) 25-100 MG per tablet immediate release 1 tablet (1 tablet Oral Given 12/10/19 1450)    ED Course  I have reviewed the triage vital signs and the nursing notes.  Pertinent labs & imaging results that were available during my care of the patient were reviewed by me and considered in my medical decision making (see chart for details).  Patient seen and examined. Work-up initiated. Medications ordered.   Vital signs reviewed and are as follows: BP 137/79 (BP Location: Right Arm)   Pulse 60   Temp 98.3 F (36.8 C)   Resp 16   Ht 5\' 3"  (1.6 m)   Wt 70.8 kg   SpO2 99%   BMI 27.65 kg/m   I discussed the patient with Dr. Theda Sers of neurology who will see patient.  Dr. Theda Sers has consulted on patient.  Patient be started on Sinemet.  Given reported weakness episode and movement problem, will attempt to obtain MRI in the ED prior to discharge.  They request MRI of the brain,  MRA of the head and neck.  After benadryl -- tremor is improved.   3:40 PM patient has been given a total of 2 mg of Ativan for anxiety prior to MRI.  Unfortunately she still feels very anxious and does not feel like she can tolerate the MRI at this time.  I again discussed the case with Dr. Theda Sers.  Patient would not necessarily need admitted for MRI.  This could be arranged as an outpatient if needed.  I went back and discussed with the patient.  Will make 1 more attempt at getting MRI by giving 2 mg of Ativan IV.  Patient discussed with and seen by Dr. Roslynn Amble.  I discussed plan with Dr. Billy Fischer who will follow up on MRI results.  I have sent in prescription for Sinemet and placed an ambulatory referral to [facility] neurology.    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  Patient improved with Benadryl.  She will continue this.  Prescribed Sinemet per neurology recommendations.  Outpatient referral made.  Hopefully we will be able to get an MRI today to complete work-up.  If not, patient to follow-up closely with neuro as outpatient.    Final Clinical Impression(s) / ED Diagnoses Final diagnoses:  Extrapyramidal reaction    Rx / DC Orders ED Discharge Orders         Ordered    carbidopa-levodopa (SINEMET) 25-100 MG tablet  3 times daily        12/10/19 1526    Ambulatory referral to Neurology       Comments: An appointment is requested in approximately: 1 week for movement disorder   12/10/19 1526           Carlisle Cater, [place] 12/10/19 1544    Lucrezia Starch, MD 12/14/19 [phone]

## 2019-12-10 NOTE — ED Notes (Signed)
Per EDP pt was already given Benadryl, so another dose was not given by this RN.

## 2019-12-10 NOTE — Progress Notes (Signed)
Brought patient to MRI. Patient immediately started complaining that she "wasn't feeling anything from the meds" and "the nurse did not give her enough medicine to help." I called the RN and told her what patient was saying and requesting. RN stated that the "patient had to try as she has already had all the medicine that she can have." I relayed this message to the patient. The patient then refused scan without being completely sedated. She stated "I will not go in there without being completely and totally asleep and out of it." Patient asked me if we could go ahead and make her a general anesthesia case. I told her that she would have to discuss that with her doctor. Patient send back to the ED.

## 2019-12-10 NOTE — ED Triage Notes (Signed)
The pt is c/o a shaking in her lt arm since yesterday    Difficulty swallowing since last night during her sleep period  No previous history

## 2019-12-10 NOTE — ED Triage Notes (Signed)
Emergency Medicine Provider Triage Evaluation Note  Emily Rice , a 59 y.o. female with a history of A. fib on Eliquis, bipolar disorder, and anxiety. She was evaluated in triage by myself and Dr. Dayna Barker, attending physician.  Pt complains of tongue swelling.  The patient reports that 5 days ago she developed shaking in her bilateral arms and legs that began suddenly.  She has also been having shaking and tremors in her lower face and mouth.  Symptoms have persisted since onset.  However, last night she started to feel as if she was having difficulty swallowing because her tongue was swelling.  She states that she is not sure that it is actually swelling but due to the tremors her tongue keeps hitting the top of her mouth and it makes her feel as if she cannot swallow.  She also feels as if her vision is blurred.  She denies shortness of breath, chest pain, drooling, choking, numbness, weakness, slurred speech, facial droop, headache, vomiting, cough.  Reports she was started on Ingrezza in June.  Several weeks ago, she developed tremors and was started on amantadine to try and help with her symptoms.  No other recent medication changes.  She is on Xanax 3 times per day as needed.  Reports that she typically will take 2 doses daily, but has not had a dose since yesterday.  She attempted to take 25 mg of Benadryl last night for her symptoms without improvement.   Review of Systems  Positive: Tongue swelling, tremors, blurred vision, dysphagia Negative: Shortness of breath, chest pain, weakness, numbness, facial droop, slurred speech, fever, chills  Physical Exam  BP (!) 150/80   Pulse 77   Temp 98.3 F (36.8 C)   Resp 20   Ht 5\' 3"  (1.6 m)   Wt 70.8 kg   SpO2 100%   BMI 27.65 kg/m  Gen:   Awake, no distress, anxious appearing HEENT:  Atraumatic, rhythmic jerking noted to the jaw, lower face, and tongue.  Tongue is not edematous.  Uvula is midline.  Posterior oropharynx is patent.  No  drooling or tripoding. Resp:  Normal effort.  Speaks in complete, fluent sentences without increased work of breathing.  Lungs are clear to auscultation bilaterally. Cardiac:  Normal rate  Abd:   Nondistended, nontender  MSK:   Moves extremities without difficulty Neuro:  Speech clear.  Rhythmic jerking noted to the bilateral arms and legs.  More prevalent in the left arm and right leg initially.  However, there may be a volitional component as prevalence appears to wax and wane and dissipate with changes in extremity movement.  Good strength against resistance of the bilateral upper and lower extremities.  No hyperreflexia.  Medical Decision Making  Medically screening exam initiated at 7:14 AM.  Appropriate orders placed.  Emily Rice was informed that the remainder of the evaluation will be completed by another provider, this initial triage assessment does not replace that evaluation, and the importance of remaining in the ED until their evaluation is complete.  Clinical Impression   59 year old female with history of A. fib on Eliquis, anxiety, and bipolar disorder presenting with rhythmic jerking in the bilateral upper and lower extremities, more prevalent in the left arm and right leg for the last 5 days.  She is also having some rhythmic jerking in the mouth.  There are components of her exam that appear volitional.  However, given that she is on multiple antipsychotics, including Ingrezza, there is also concern for tardive  dyskinesia.  We will give 50 mg of Benadryl.   Her primary concern for presenting today is regarding her airway as she feels as if her tongue is swelling and there is difficulty swallowing.  No edema of the tongue.  She is tolerating secretions without difficulty.  She is not hypoxic.  Airway is patent.  The patient was also seen and independently evaluated by Dr. Dayna Barker, attending physician.  At this time, patient is safe to return to waiting room until the remainder of  her work-up and evaluation can be completed.     Emily Rice A, PA-C 12/10/19 [phone]

## 2019-12-10 NOTE — ED Notes (Signed)
Pt transported to MRI 

## 2019-12-10 NOTE — Discharge Instructions (Addendum)
-   Trial of carbidopa/levodopa 25/100mg  three times daily. If mild/no improvement in motor symptoms, may increase gradually as tolerated to 75/300mg  three times daily. - Continue apixaban. - Stop tetrabenazine for now. - Follow up outpatient neurology.   Please read and follow all provided instructions.  Your diagnoses today include:  1. Extrapyramidal reaction     Tests performed today include: Vital signs. See below for your results today.   Medications prescribed:  Sinemet - medication to help with tremors  Take any prescribed medications only as directed.  Home care instructions:  Follow any educational materials contained in this packet.  You may take Benadryl over-the-counter, 25-50mg  every 6 hours.   Follow-up instructions: Please follow-up with your neurologist when able. I have placed an Emergency Department referral for this.   Return instructions:  Please return to the Emergency Department if you experience worsening symptoms.  Return if you have weakness in your arms or legs, slurred speech, trouble walking or talking, confusion, or trouble with your balance.  Please return if you have any other emergent concerns.  Additional Information:  Your vital signs today were: BP 130/82   Pulse (!) 56   Temp 98.3 F (36.8 C)   Resp 16   Ht 5\' 3"  (1.6 m)   Wt 70.8 kg   SpO2 99%   BMI 27.65 kg/m  If your blood pressure (BP) was elevated above 135/85 this visit, please have this repeated by your doctor within one month. --------------

## 2019-12-10 NOTE — ED Notes (Signed)
Pt returning from MRI upset states " I am not able to do this procedure without medication." pt oriented that she got medication prior to go to MRI pt states "that didn't work and I need you to talk to the consulting doctor to get the medication that I need."

## 2019-12-10 NOTE — Consult Note (Signed)
Consult H&P  CC: 4 days ago developed constant rhythmic movements of extremities L>R  History is obtained from: paitent  HPI: Emily Rice is a 59 y.o. female bipolar disorder (lamictal) and chronic tardive dyskinesia (previously trihexyphenidyl now amantadine) was started on tetrabenazine and per her account slowly developed a mild tremor however it was tolerable. But 4 days ago (Sunday) she noticed constant rhythmic movements in her arms (L>R) and legs (L>R) constant rhythmic motions in her left arm and leg. She had associated intermittent episodes of blurred vision and a sensation of tongue swelling causing difficulty swallowing which prompted her to seek care.  She suspected it was caused by tetrabenazine and she did not take her dose last night.  She also c/o sharp headache with right arm weakness with tearing and runny nose which lasted 2 days. prompting her ophthalmologist to recommend MRI brain.   She is on apixaban for afib/DVT/PE   No recent fevers/chills/nausea/vomiting/diarrhea.    ROS: A complete ROS was performed and is negative except as noted in the HPI.   Past Medical History:  Diagnosis Date  . A-fib ([place])   . Anxiety   . Bipolar disorder ([place])   . DVT (deep venous thrombosis) ([place]) 2001 and 2002  . Pulmonary embolism ([place]) 2001   Family History  Problem Relation Age of Onset  . Hypertension Mother   . Stroke Father   . Hypertension Father   . Atrial fibrillation Father   . Diabetes Sister   . CAD Brother   . Hypertension Maternal Uncle    Social History:  reports that she has never smoked. She has never used smokeless tobacco. She reports previous alcohol use. No history on file for drug use.   Prior to Admission medications   Medication Sig Start Date End Date Taking? Authorizing Provider  ALPRAZolam Duanne Moron) 1 MG tablet Take 1 mg by mouth 3 (three) times daily as needed for anxiety.  08/11/19  Yes [provider]  amantadine (SYMMETREL) 100 MG  capsule Take 100 mg by mouth at bedtime. 11/20/19  Yes [provider]  apixaban (ELIQUIS) 5 MG TABS tablet Take 5 mg by mouth 2 (two) times daily.  07/23/18  Yes [provider]  cetirizine (ZYRTEC) 10 MG tablet Take 10 mg by mouth daily as needed for allergies.    Yes [provider]  Cholecalciferol 75 MCG (3000 UT) TABS Take 3,000 Units by mouth daily.    Yes [provider]  diphenhydrAMINE (BENADRYL) 25 mg capsule Take 25 mg by mouth every 6 (six) hours as needed for itching or allergies.    Yes [provider]  fluticasone (FLONASE) 50 MCG/ACT nasal spray Place 1 spray into both nostrils daily as needed for allergies.    Yes [provider]  lamoTRIgine (LAMICTAL) 100 MG tablet Take 100-200 mg by mouth See admin instructions. Take 1 tablet in the morning and take 2 tablets at night 07/23/18  Yes [provider]  metoprolol succinate (TOPROL XL) 25 MG 24 hr tablet Take 0.5 tablets (12.5 mg total) by mouth in the morning. Patient taking differently: Take 12.5 mg by mouth daily.  12/03/19  Yes Tolia, Sunit, DO  Multiple Vitamin (MULTI-VITAMIN) tablet Take 1 tablet by mouth daily.   Yes [provider]  naratriptan (AMERGE) 2.5 MG tablet Take 2.5 mg by mouth as needed for migraine.  12/10/18  Yes [provider]  TRINTELLIX 10 MG TABS tablet Take 10 mg by mouth daily. 11/19/19  Yes  [provider]  Valbenazine Tosylate (INGREZZA) 80 MG CAPS Take 80 mg by mouth at bedtime.   Yes [provider]   Exam: Current vital signs: BP 137/79 (BP Location: Right Arm)   Pulse 64   Temp 98.3 F (36.8 C)   Resp 16   Ht 5\' 3"  (1.6 m)   Wt 70.8 kg   SpO2 99%   BMI 27.65 kg/m   Physical Exam  Constitutional: Appears well-developed and well-nourished.  Psych: Affect appropriate to situation Eyes: No scleral injection HENT: No OP obstrucion Head: Normocephalic.  Cardiovascular: Normal rate and regular rhythm.   Respiratory: Effort normal and breath sounds normal to anterior ascultation GI: Soft.  No distension. There is no tenderness.  Skin: WDI  Neuro: Mental Status: Patient is awake, alert, oriented to person, place, month, year, and situation. Patient is able to give a clear and coherent history. No signs of aphasia or neglect Cranial Nerves: II: Visual Fields are full. Pupils are equal, round, and reactive to light.   III,IV, VI: EOMI without ptosis or diploplia.  V: Facial sensation is symmetric to temperature VII: Facial movement is symmetric.  VIII: hearing is intact to voice X: Uvula elevates symmetrically XI: Shoulder shrug is symmetric. XII: tongue is midline without atrophy or fasciculations.  Motor: Tone is normal. Bulk is normal. 5/5 strength was present in all four extremities.  Left more affected than right. Low frequency, low amplitude resting tremor which is suppressible. No cogwheel rigidity. Movements are not painful but are extremely bothersome. Sensory: Sensation is symmetric to light touch and temperature in the arms and legs. Deep Tendon Reflexes: 2+ and symmetric in the biceps and patellae.  Plantars: Toes are downgoing bilaterally.  Cerebellar: FNF and HKS are intact bilaterally.  Impression: 59 year old woman bipolar, tardive dyskinesia (amantadiine), atrial fibrillation (apixaban) with subacute onset of low frequency, low amplitude resting tremor which is suppressible. Movements are not painful but are extremely bothersome. Recent headache with right arm and leg weakness lasting 2 days.  The patient has underlying tardive dyskinesia which is a confounder, however the bilateral presentation of parkinsonian movement in setting of tetrabenazine which alterats dopamine vesicular storage/DA depletion suggests drug induced parkinsonism. However, the patient has a-fib and with recent right sided weakness she would benefit from MRI brain and MRA head and  neck.  Plan: - MRI brain MRA head and neck. - She felt the diphenhydramine provided some benefit and consider redosing - Trial of carbidopa/levodopa 25/100mg  three times daily. If mild/no improvement in motor symptoms, may increase gradually as tolerated to 75/300mg  three times daily. - Continue apixaban. - Stop tetrabenazine for now. - Follow up outpatient neurology.   Electronically signed by: Dr. Lynnae Sandhoff Pager: [phone] 12/10/2019, 11:05 AM

## 2019-12-11 ENCOUNTER — Telehealth: Payer: Self-pay | Admitting: *Deleted

## 2019-12-11 NOTE — Telephone Encounter (Signed)
Pt called regarding referral placed to [facility] Neurology. Pt states upon contacting [facility], they informed her that they "do not treat patients with her condition" and suggested her ask ED to send referral to [facility] faxed ED visit to [facility] Neurologist as requested.

## 2020-01-08 ENCOUNTER — Ambulatory Visit: Payer: Medicare HMO | Admitting: Neurology

## 2020-01-11 ENCOUNTER — Ambulatory Visit: Payer: Medicare HMO | Admitting: Cardiology

## 2020-01-12 ENCOUNTER — Ambulatory Visit: Payer: Medicare HMO | Admitting: Neurology

## 2020-01-12 ENCOUNTER — Telehealth: Payer: Self-pay | Admitting: *Deleted

## 2020-01-12 ENCOUNTER — Encounter: Payer: Self-pay | Admitting: Neurology

## 2020-01-12 ENCOUNTER — Telehealth: Payer: Self-pay | Admitting: Neurology

## 2020-01-12 ENCOUNTER — Other Ambulatory Visit: Payer: Self-pay

## 2020-01-12 VITALS — BP 130/76 | HR 50 | Ht 63.0 in | Wt 159.0 lb

## 2020-01-12 DIAGNOSIS — G43709 Chronic migraine without aura, not intractable, without status migrainosus: Secondary | ICD-10-CM

## 2020-01-12 DIAGNOSIS — F3160 Bipolar disorder, current episode mixed, unspecified: Secondary | ICD-10-CM | POA: Insufficient documentation

## 2020-01-12 DIAGNOSIS — R259 Unspecified abnormal involuntary movements: Secondary | ICD-10-CM | POA: Diagnosis not present

## 2020-01-12 MED ORDER — ZOLMITRIPTAN 5 MG PO TBDP: 5.0000 mg | ORAL_TABLET | ORAL | 6 refills | Status: DC | PRN

## 2020-01-12 MED ORDER — AIMOVIG 70 MG/ML ~~LOC~~ SOAJ: 70.0000 mg | SUBCUTANEOUS | 11 refills | Status: DC

## 2020-01-12 MED ORDER — CARBIDOPA-LEVODOPA 25-100 MG PO TABS
1.0000 | ORAL_TABLET | Freq: Three times a day (TID) | ORAL | 6 refills | Status: DC
Start: 2020-01-12 — End: 2020-04-05

## 2020-01-12 MED ORDER — SUMATRIPTAN SUCCINATE 100 MG PO TABS: 100.0000 mg | ORAL_TABLET | Freq: Once | ORAL | 11 refills | Status: DC | PRN

## 2020-01-12 MED ORDER — ZOLMITRIPTAN 5 MG PO TABS: ORAL_TABLET | ORAL | 11 refills | Status: DC

## 2020-01-12 NOTE — Progress Notes (Signed)
Chief Complaint  Patient presents with  . New Patient (Initial Visit)    Referred for evaluation of drug induced parkinsonism. Reports having worsening body tremors over the last few months. Both Ingrezza and Trintellix has been stopped now. She has been placed on Sinemet 25-100mg , one tablet four times daily, which has helped.   Marland Kitchen Psychiatric Provider    Noemi Chapel, NP - referred  . PCP    Glenis Smoker, MD    HISTORICAL  Natania Finigan is a 59 year old female seen in request by her psychiatrist nurse practitioner Noemi Chapel and her primary care physician Dr. Glenis Smoker, for evaluation of abnormal movement, initial evaluation was on January 12, 2020  I reviewed and summarized the referring note. PMHx. A fib on chronic anticoagulation Bipolar disorder, Lamotrigine 200mg  bid. HTN DVT,  Bilateral lower extremity. Father had history of DVT PE.  She moved from [place] to [place] area in early 2020, has been under the care of current psychiatrist since then, she complains of suboptimal control of her bipolar disorder, depression anxiety, over the past few visit, there was frequent medication changes, reviewing the list, since June 2021, she was given the trial of Klonopin, trazodone, Wellbutrin, BuSpar, Vraylar, lamotrigine, Ingrezza, Trintellix,  Around March 2021, she began to develop abnormal movement involving her face, intermittent twitching movement of her arms and legs, she was given the diagnosis of tardive dyskinesia, initially treated with Cogentin, did not help, then started on Ingrezza 40 mg in June 2021 at bedtime, did not help either.  Amantadine 100 mg every night did not help,  For abnormal movement gradually getting worse, in early September 2021, she also tried Benadryl at home up to 50 mg every night without helping her symptoms,  Eventually presented to emergency room December 10, 2019, was seen by neuro hospitalist Dr. Ernestine Mcmurray, described  constant rhythmic movement in her arms, and legs, with associated intermittent episode of blurry vision, sense of tongue swelling, cause difficulty swallowing  Personally reviewed MRI of the brain on December 10, 2019, no acute intracranial abnormality  MRA of the brain and neck showed no acute abnormality  She was diagnosed with possible tardive dyskinesia, also drug-induced parkinsonism, was started on Sinemet 25/100 mg 3 times a day, she self titrated up the dosage to 4 times a day, which does help her abnormal movement  There was also a major medication change for her suboptimal control of depression anxiety, she is now on higher dose of lamotrigine 200 mg twice a day, Xanax 1 mg 3 times a day  She has a long history of chronic migraine, previously was referred to our clinic,  She was treated with Aimovig as preventive medication at [facility] clinic, which was helpful, she now complains couple times severe prolonged migraine headaches, previously tried abortive treatment failed Imitrex, Maxalt, nazatriptan   REVIEW OF SYSTEMS: Full 14 system review of systems performed and notable only for as above All other review of systems were negative.  ALLERGIES: Allergies  Allergen Reactions  . Hydromorphone Other (See Comments) and Hives    Stroke like symptoms stroke symptoms. treated with Narcan.  tolerated Percocet in the past. stroke symptoms. treated with Narcan.  tolerated Percocet in the past. Stroke like symptoms   . Iodinated Diagnostic Agents   . Prochlorperazine Swelling    compazine-Tongue swelling compazine-Tongue swelling   . Prochlorperazine Edisylate Swelling    HOME MEDICATIONS: Current Outpatient Medications  Medication Sig Dispense Refill  . ALPRAZolam Duanne Moron) 1  MG tablet Take 1 mg by mouth 3 (three) times daily as needed for anxiety.     Marland Kitchen amantadine (SYMMETREL) 100 MG capsule Take 100 mg by mouth at bedtime.    Marland Kitchen apixaban (ELIQUIS) 5 MG TABS tablet Take 5  mg by mouth 2 (two) times daily.     . carbidopa-levodopa (SINEMET) 25-100 MG tablet Take 1 tablet by mouth 3 (three) times daily. (Patient taking differently: Take 1 tablet by mouth 4 (four) times daily. ) 90 tablet 0  . cetirizine (ZYRTEC) 10 MG tablet Take 10 mg by mouth daily as needed for allergies.     . Cholecalciferol 75 MCG (3000 UT) TABS Take 3,000 Units by mouth daily.     . diphenhydrAMINE (BENADRYL) 25 mg capsule Take 25 mg by mouth every 6 (six) hours as needed for itching or allergies.     . fluticasone (FLONASE) 50 MCG/ACT nasal spray Place 1 spray into both nostrils daily as needed for allergies.     Marland Kitchen lamoTRIgine (LAMICTAL) 100 MG tablet Take 100-200 mg by mouth See admin instructions. Take 1 tablet in the morning and take 2 tablets at night    . metoprolol succinate (TOPROL XL) 25 MG 24 hr tablet Take 12.5 mg by mouth daily.    . Multiple Vitamin (MULTI-VITAMIN) tablet Take 1 tablet by mouth daily.     No current facility-administered medications for this visit.    PAST MEDICAL HISTORY: Past Medical History:  Diagnosis Date  . A-fib ([place])   . Anxiety   . Bipolar disorder ([place])   . DVT (deep venous thrombosis) ([place]) 2001 and 2002  . Pulmonary embolism ([place]) 2001  . Tremor     PAST SURGICAL HISTORY: Past Surgical History:  Procedure Laterality Date  . CHOLECYSTECTOMY  2014  . OVARIAN CYST REMOVAL    . TONSILLECTOMY      FAMILY HISTORY: Family History  Problem Relation Age of Onset  . Hypertension Mother   . Stroke Father   . Hypertension Father   . Atrial fibrillation Father   . Diabetes Sister   . CAD Brother   . Hypertension Maternal Uncle     SOCIAL HISTORY: Social History   Socioeconomic History  . Marital status: Married    Spouse name: Not on file  . Number of children: 2  . Years of education: college  . Highest education level: Not on file  Occupational History  . 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 Smoking status: Never  Smoker  . Smokeless tobacco: Never Used  Vaping Use  . Vaping Use: Never used  Substance and Sexual Activity  . Alcohol use: Not Currently  . Drug use: Never  . Sexual activity: Not on file  Other Topics Concern  . Not on file  Social History Narrative   Lives with husband.   Right-handed.   No daily use of caffeine.      Social Determinants of Health   Financial Resource Strain:   . Difficulty of Paying Living Expenses: Not on file  Food Insecurity:   . Worried About Charity fundraiser in the Last Year: Not on file  . Ran Out of Food in the Last Year: Not on file  Transportation Needs:   . Lack of Transportation (Medical): Not on file  . Lack of Transportation (Non-Medical): Not on file  Physical Activity:   . Days of Exercise per Week: Not on file  . Minutes of Exercise per  Session: Not on file  Stress:   . Feeling of Stress : Not on file  Social Connections:   . Frequency of Communication with Friends and Family: Not on file  . Frequency of Social Gatherings with Friends and Family: Not on file  . Attends Religious Services: Not on file  . Active Member of Clubs or Organizations: Not on file  . Attends Archivist Meetings: Not on file  . Marital Status: Not on file  Intimate Partner Violence:   . Fear of Current or Ex-Partner: Not on file  . Emotionally Abused: Not on file  . Physically Abused: Not on file  . Sexually Abused: Not on file     PHYSICAL EXAM   Vitals:   01/12/20 1022  BP: 130/76  Pulse: (!) 50  Weight: 159 lb (72.1 kg)  Height: 5\' 3"  (1.6 m)   Not recorded     Body mass index is 28.17 kg/m.  PHYSICAL EXAMNIATION:  Gen: NAD, conversant, well nourised, well groomed                     Cardiovascular: Regular rate rhythm, no peripheral edema, warm, nontender. Eyes: Conjunctivae clear without exudates or hemorrhage Neck: Supple, no carotid bruits. Pulmonary: Clear to auscultation bilaterally   NEUROLOGICAL EXAM:  MENTAL  STATUS: Speech:    Speech is normal; fluent and spontaneous with normal comprehension.  Cognition:     Orientation to time, place and person     Normal recent and remote memory     Normal Attention span and concentration     Normal Language, naming, repeating,spontaneous speech     Fund of knowledge   CRANIAL NERVES: CN II: Visual fields are full to confrontation. Pupils are round equal and briskly reactive to light. CN III, IV, VI: extraocular movement are normal. No ptosis. CN V: Facial sensation is intact to light touch CN VII: Face is symmetric with normal eye closure  CN VIII: Hearing is normal to causal conversation. CN IX, X: Phonation is normal. CN XI: Head turning and shoulder shrug are intact  MOTOR: There is no pronator drift of out-stretched arms. Muscle bulk and tone are normal. Muscle strength is normal.  REFLEXES: Reflexes are 2+ and symmetric at the biceps, triceps, knees, and ankles. Plantar responses are flexor.  SENSORY: Intact to light touch, pinprick and vibratory sensation are intact in fingers and toes.  COORDINATION: There is no trunk or limb dysmetria noted.  GAIT/STANCE: Posture is normal. Gait is steady with normal steps, base, arm swing, and turning. Heel and toe walking are normal. Tandem gait is normal.  Romberg is absent.   DIAGNOSTIC DATA (LABS, IMAGING, TESTING) - I reviewed patient records, labs, notes, testing and imaging myself where available.   ASSESSMENT AND PLAN  Mohini Mertz is a 59 y.o. female   Abnormal movement  Large amplitude symmetric bilateral upper and lower extremity involvement,  No parkinsonian features on today's examination,  Described abnormal movement happened in the setting of suboptimal control of her depression anxiety, mood disorder likely play a role,  She reported significant improvement with Sinemet, will continue at 25/100 mg 3 times daily  If she continued to do well with better control of her mood  disorder, may consider trial of tapering off levodopa Chronic migraine headaches  Restart Aimovig 70 mg every month as preventive medication  Zomig as needed for abortive treatment   Marcial Pacas, M.D. Ph.D.  [facility] [address]  [place], [place] [address] Ph: [phone] Fax: [phone]  CC:  Noemi Chapel, [address] [place],  [place] [address]

## 2020-01-12 NOTE — Telephone Encounter (Signed)
Meds ordered this encounter  Medications  . zolmitriptan (ZOMIG ZMT) 5 MG disintegrating tablet    Sig: Take 1 tablet (5 mg total) by mouth as needed for migraine. Do not refill in less than 30 days    Dispense:  12 tablet    Refill:  6    I have called in zomig 5mg  prn

## 2020-01-12 NOTE — Telephone Encounter (Addendum)
PA started on covermymeds for Aimovig 70mg  (key: [id number]). Pt has pharmacy coverage through [facility] [phone]). Decision pending.  She realized after leaving her appt today that she has failed sumatriptan, rizatriptan and naratriptan. She would like a new prescription sent to the pharmacy for a rescue medication.

## 2020-01-12 NOTE — Addendum Note (Signed)
Addended by: Marcial Pacas on: 01/12/2020 03:00 PM   Modules accepted: Orders

## 2020-01-12 NOTE — Addendum Note (Signed)
Addended by: Noberto Retort C on: 01/12/2020 03:10 PM   Modules accepted: Orders

## 2020-01-12 NOTE — Telephone Encounter (Signed)
At check out for 11/02 appointment, pt stated she did not wish to make 6 month f/u at this time and would call back to schedule.

## 2020-01-12 NOTE — Telephone Encounter (Signed)
I returned the call to the patient. She is agreeable to try zolmitriptan. She is also aware that a prior authorization is pending for Aimovig.  I called [facility] and left a message asking them to void any other triptan prescriptions on file.

## 2020-01-13 NOTE — Telephone Encounter (Signed)
PA for Aimovig approved by [facility]. Valid through 04/11/2020. Call [phone] for any questions.

## 2020-01-25 ENCOUNTER — Ambulatory Visit: Payer: Medicare HMO | Admitting: Cardiology

## 2020-01-28 DIAGNOSIS — R350 Frequency of micturition: Secondary | ICD-10-CM | POA: Diagnosis not present

## 2020-01-28 DIAGNOSIS — R102 Pelvic and perineal pain: Secondary | ICD-10-CM | POA: Diagnosis not present

## 2020-01-28 DIAGNOSIS — N898 Other specified noninflammatory disorders of vagina: Secondary | ICD-10-CM | POA: Diagnosis not present

## 2020-01-29 ENCOUNTER — Emergency Department (HOSPITAL_COMMUNITY): Payer: Medicare HMO

## 2020-01-29 ENCOUNTER — Emergency Department (HOSPITAL_COMMUNITY)
Admission: EM | Admit: 2020-01-29 | Discharge: 2020-01-29 | Disposition: A | Discharge status: Home / Self Care | Payer: Medicare HMO | Attending: Emergency Medicine | Admitting: Emergency Medicine

## 2020-01-29 ENCOUNTER — Other Ambulatory Visit: Payer: Self-pay

## 2020-01-29 DIAGNOSIS — R1030 Lower abdominal pain, unspecified: Secondary | ICD-10-CM | POA: Insufficient documentation

## 2020-01-29 DIAGNOSIS — R1084 Generalized abdominal pain: Secondary | ICD-10-CM | POA: Diagnosis not present

## 2020-01-29 DIAGNOSIS — R102 Pelvic and perineal pain: Secondary | ICD-10-CM | POA: Insufficient documentation

## 2020-01-29 DIAGNOSIS — R109 Unspecified abdominal pain: Secondary | ICD-10-CM

## 2020-01-29 DIAGNOSIS — D259 Leiomyoma of uterus, unspecified: Secondary | ICD-10-CM | POA: Diagnosis not present

## 2020-01-29 DIAGNOSIS — R52 Pain, unspecified: Secondary | ICD-10-CM

## 2020-01-29 LAB — URINALYSIS, ROUTINE W REFLEX MICROSCOPIC
Bilirubin Urine: NEGATIVE
Glucose, UA: NEGATIVE mg/dL
Hgb urine dipstick: NEGATIVE
Ketones, ur: NEGATIVE mg/dL
Leukocytes,Ua: NEGATIVE
Nitrite: NEGATIVE
Protein, ur: NEGATIVE mg/dL
Specific Gravity, Urine: 1.006 (ref 1.005–1.030)
pH: 6 (ref 5.0–8.0)

## 2020-01-29 LAB — COMPREHENSIVE METABOLIC PANEL
ALT: 6 U/L (ref 0–44)
AST: 27 U/L (ref 15–41)
Albumin: 4.1 g/dL (ref 3.5–5.0)
Alkaline Phosphatase: 51 U/L (ref 38–126)
Anion gap: 11 (ref 5–15)
BUN: 15 mg/dL (ref 6–20)
CO2: 23 mmol/L (ref 22–32)
Calcium: 9.1 mg/dL (ref 8.9–10.3)
Chloride: 108 mmol/L (ref 98–111)
Creatinine, Ser: 1.05 mg/dL — ABNORMAL HIGH (ref 0.44–1.00)
GFR, Estimated: >60 mL/min (ref >60)
Glucose, Bld: 84 mg/dL (ref 70–99)
Potassium: 3.9 mmol/L (ref 3.5–5.1)
Sodium: 142 mmol/L (ref 135–145)
Total Bilirubin: 0.7 mg/dL (ref 0.3–1.2)
Total Protein: 7.1 g/dL (ref 6.5–8.1)

## 2020-01-29 LAB — LIPASE, BLOOD: Lipase: 33 U/L (ref 11–51)

## 2020-01-29 LAB — CBC
HCT: 39.7 % (ref 36.0–46.0)
Hemoglobin: 12.7 g/dL (ref 12.0–15.0)
MCH: 27 pg (ref 26.0–34.0)
MCHC: 32 g/dL (ref 30.0–36.0)
MCV: 84.3 fL (ref 80.0–100.0)
Platelets: 171 10*3/uL (ref 150–400)
RBC: 4.71 MIL/uL (ref 3.87–5.11)
RDW: 14.5 % (ref 11.5–15.5)
WBC: 6.4 10*3/uL (ref 4.0–10.5)
nRBC: 0 % (ref 0.0–0.2)

## 2020-01-29 MED ORDER — MORPHINE SULFATE (PF) 4 MG/ML IV SOLN
4.0000 mg | Freq: Once | INTRAVENOUS | Status: AC
  Administered 2020-01-29: 4 mg via INTRAVENOUS
  Filled 2020-01-29: qty 1

## 2020-01-29 MED ORDER — HYDROCODONE-ACETAMINOPHEN 5-325 MG PO TABS
1.0000 | ORAL_TABLET | Freq: Four times a day (QID) | ORAL | 0 refills | Status: AC | PRN
Start: 2020-01-29 — End: 2020-02-01

## 2020-01-29 MED ORDER — SODIUM CHLORIDE 0.9 % IV BOLUS
500.0000 mL | Freq: Once | INTRAVENOUS | Status: AC
  Administered 2020-01-29: 500 mL via INTRAVENOUS

## 2020-01-29 NOTE — ED Notes (Addendum)
Pt to ED today for pelvic pain that radiates to lower back and down both legs.   Pt reports her OBGYN recommended hysterectomy for fibroids.   Noted bradycardia on monitor-- pt asymptomatic takes metoprolol.

## 2020-01-29 NOTE — ED Provider Notes (Signed)
7:04 PM I discussed the patient's ultrasound results with her.  She has a scheduled follow-up with her gynecologist in 3 days.  She requests medication to last the weekend.   Carmin Muskrat, MD 01/29/20 1904

## 2020-01-29 NOTE — ED Provider Notes (Signed)
[facility] EMERGENCY DEPARTMENT Provider Note   CSN: [id number] Arrival date & time: 01/29/20  1144     History Chief Complaint  Patient presents with  . Abdominal Pain  . Pelvic Pain  . Back Pain    Emily Rice is a 59 y.o. female.  59 year old female with prior medical history detailed below presents for evaluation of low abdominal and pelvic pain.  Patient reports onset of pain over the last week.  Patient was seen by her GYN yesterday for same complaint.  Her GYN performed a pelvic exam and schedule her for an ultrasound on Monday of this next week.  Patient reports significant prior history of fibroids.  She has been told that she should probably get a hysterectomy.  She did not opt in for this procedure.  Patient reports prior history of fibroid related pain.  Patient's reported pain today is worse than her normal pain related to fibroids.  She denies associated fever.  She denies nausea or vomiting.  The history is provided by the patient and medical records.  Abdominal Pain Pain location:  Suprapubic Pain quality: aching and cramping   Pain radiates to:  Does not radiate Pain severity:  Moderate Onset quality:  Gradual Duration:  5 days Timing:  Constant Progression:  Worsening Chronicity:  New Relieved by:  Nothing Worsened by:  Nothing Pelvic Pain Associated symptoms include abdominal pain.  Back Pain Associated symptoms: abdominal pain and pelvic pain        Past Medical History:  Diagnosis Date  . A-fib ([place])   . Anxiety   . Bipolar disorder ([place])   . DVT (deep venous thrombosis) ([place]) 2001 and 2002  . Pulmonary embolism ([place]) 2001  . Tremor     Patient Active Problem List   Diagnosis Date Noted  . Chronic migraine w/o aura w/o status migrainosus, not intractable 01/12/2020  . Bipolar affective disorder, current episode mixed ([place]) 01/12/2020  . Abnormal movement 01/12/2020  . DVT (deep venous thrombosis) ([place])     Past  Surgical History:  Procedure Laterality Date  . CHOLECYSTECTOMY  2014  . OVARIAN CYST REMOVAL    . TONSILLECTOMY       OB History   No obstetric history on file.     Family History  Problem Relation Age of Onset  . Hypertension Mother   . Stroke Father   . Hypertension Father   . Atrial fibrillation Father   . Diabetes Sister   . CAD Brother   . Hypertension Maternal Uncle     Social History   Tobacco Use  . Smoking status: Never Smoker  . Smokeless tobacco: Never Used  Vaping Use  . Vaping Use: Never used  Substance Use Topics  . Alcohol use: Not Currently  . Drug use: Never    Home Medications Prior to Admission medications   Medication Sig Start Date End Date Taking? Authorizing Provider  ALPRAZolam Duanne Moron) 1 MG tablet Take 1 mg by mouth 3 (three) times daily as needed for anxiety.  08/11/19  Yes [provider]  amantadine (SYMMETREL) 100 MG capsule Take 100 mg by mouth at bedtime. 11/20/19  Yes [provider]  apixaban (ELIQUIS) 5 MG TABS tablet Take 5 mg by mouth 2 (two) times daily.  07/23/18  Yes [provider]  carbidopa-levodopa (SINEMET) 25-100 MG tablet Take 1 tablet by mouth 3 (three) times daily. 01/12/20  Yes Marcial Pacas, MD  Fexofenadine HCl (ALLEGRA PO) Take 1 tablet by mouth  daily as needed (Allergies).   Yes [provider]  Cholecalciferol 75 MCG (3000 UT) TABS Take 3,000 Units by mouth daily.     [provider]  diphenhydrAMINE (BENADRYL) 25 mg capsule Take 25 mg by mouth every 6 (six) hours as needed for itching or allergies.     [provider]  Erenumab-aooe (AIMOVIG) 70 MG/ML SOAJ Inject 70 mg into the skin every 30 (thirty) days. 01/12/20   Marcial Pacas, MD  fluticasone (FLONASE) 50 MCG/ACT nasal spray Place 1 spray into both nostrils daily as needed for allergies.     [provider]  lamoTRIgine (LAMICTAL) 100 MG tablet Take 100-200 mg by mouth See admin instructions. Take 1 tablet in  the morning and take 2 tablets at night 07/23/18   [provider]  metoprolol succinate (TOPROL XL) 25 MG 24 hr tablet Take 12.5 mg by mouth daily.    [provider]  Multiple Vitamin (MULTI-VITAMIN) tablet Take 1 tablet by mouth daily.    [provider]  zolmitriptan (ZOMIG) 5 MG tablet Take 1 tab at onset of migraine.  May repeat in 2 hrs, if needed.  Max dose: 2 tabs/day. This is a 30 day prescription. 01/12/20   Marcial Pacas, MD    Allergies    Hydromorphone, Iodinated diagnostic agents, Prochlorperazine, and Prochlorperazine edisylate  Review of Systems   Review of Systems  Gastrointestinal: Positive for abdominal pain.  Genitourinary: Positive for pelvic pain.  Musculoskeletal: Positive for back pain.  All other systems reviewed and are negative.   Physical Exam Updated Vital Signs BP 131/74   Pulse (!) 44   Temp 98.9 F (37.2 C) (Oral)   Resp 17   Ht 5\' 3"  (1.6 m)   Wt 71.7 kg   SpO2 100%   BMI 27.99 kg/m   Physical Exam Vitals and nursing note reviewed.  Constitutional:      General: She is not in acute distress.    Appearance: She is well-developed.  HENT:     Head: Normocephalic and atraumatic.  Eyes:     Conjunctiva/sclera: Conjunctivae normal.     Pupils: Pupils are equal, round, and reactive to light.  Cardiovascular:     Rate and Rhythm: Normal rate and regular rhythm.     Heart sounds: Normal heart sounds.  Pulmonary:     Effort: Pulmonary effort is normal. No respiratory distress.     Breath sounds: Normal breath sounds.  Abdominal:     General: Abdomen is flat. There is no distension.     Palpations: Abdomen is soft.     Tenderness: There is no abdominal tenderness.  Genitourinary:    Comments: Declines repeat pelvic exam  Musculoskeletal:        General: No deformity. Normal range of motion.     Cervical back: Normal range of motion and neck supple.  Skin:    General: Skin is warm and dry.  Neurological:     Mental  Status: She is alert and oriented to person, place, and time.     ED Results / Procedures / Treatments   Labs (all labs ordered are listed, but only abnormal results are displayed) Labs Reviewed  COMPREHENSIVE METABOLIC PANEL - Abnormal; Notable for the following components:      Result Value   Creatinine, Ser 1.05 (*)    All other components within normal limits  URINALYSIS, ROUTINE W REFLEX MICROSCOPIC - Abnormal; Notable for the following components:   Color, Urine STRAW (*)  All other components within normal limits  LIPASE, BLOOD  CBC    EKG None  Radiology CT Abdomen Pelvis Wo Contrast  Result Date: 01/29/2020 CLINICAL DATA:  Abdominal pain in the lower abdomen radiating to back and legs. EXAM: CT ABDOMEN AND PELVIS WITHOUT CONTRAST TECHNIQUE: Multidetector CT imaging of the abdomen and pelvis was performed following the standard protocol without IV contrast. COMPARISON:  None. FINDINGS: Lower chest: Lung bases are clear. Hepatobiliary: Liver, gallbladder and biliary tree are normal. Pancreas: Normal. Spleen: Normal. Adrenals/Urinary Tract: Adrenal glands are normal. Kidneys normal in size without hydronephrosis or nephrolithiasis. Ureters and bladder are normal. Stomach/Bowel: Stomach and small bowel are normal. Appendix is normal. Colon is normal. Vascular/Lymphatic: Abdominal aorta is normal caliber. No adenopathy. Reproductive: Mild nodular contour to the uterus suggesting fibroids. Adnexal regions unremarkable. Other: No free fluid or focal inflammatory change. Musculoskeletal: No focal abnormality. IMPRESSION: 1. No acute findings in the abdomen/pelvis. 2. Mild nodular contour to the uterus suggesting fibroids. Electronically Signed   By: Marin Olp M.D.   On: 01/29/2020 14:41    Procedures Procedures (including critical care time)  Medications Ordered in ED Medications  morphine 4 MG/ML injection 4 mg (4 mg Intravenous Given 01/29/20 1418)  sodium chloride 0.9 %  bolus 500 mL (500 mLs Intravenous New Bag/Given 01/29/20 1418)    ED Course  I have reviewed the triage vital signs and the nursing notes.  Pertinent labs & imaging results that were available during my care of the patient were reviewed by me and considered in my medical decision making (see chart for details).    MDM Rules/Calculators/A&P                          MDM  Screen complete  Rainn Zupko was evaluated in Emergency Department on 01/29/2020 for the symptoms described in the history of present illness. She was evaluated in the context of the global COVID-19 pandemic, which necessitated consideration that the patient might be at risk for infection with the SARS-CoV-2 virus that causes COVID-19. Institutional protocols and algorithms that pertain to the evaluation of patients at risk for COVID-19 are in a state of rapid change based on information released by regulatory bodies including the CDC and federal and state organizations. These policies and algorithms were followed during the patient's care in the ED.  Patient presents with complaint of low abdominal pain.   Patient with history of fibroids. Pain today is consistent with pain likely related to fibroids.   Labs are without significant abnormality. CT AP does not show acute pathology.   Patient requests Pelvic US.   Dr. Vanita Panda aware of pending [place] and disposition.     Final Clinical Impression(s) / ED Diagnoses Final diagnoses:  None    Rx / DC Orders ED Discharge Orders    None       Valarie Merino, MD 01/29/20 [phone]

## 2020-01-29 NOTE — ED Notes (Addendum)
Pt unhooked self from monitor and got dressed. Pt states she hit the call light 45 minutes ago stating she was ready to leave.

## 2020-01-29 NOTE — Discharge Instructions (Addendum)
Please return for any problem.  °

## 2020-01-29 NOTE — ED Triage Notes (Signed)
C/o low abd pain radiating to back and down legs, saw private MD yesterday- had pelvic exam and U/A. Feels bloated, LBM 2 days ago, looser than normal -- normal in color

## 2020-02-08 ENCOUNTER — Ambulatory Visit: Payer: Medicare HMO | Admitting: Cardiology

## 2020-02-11 ENCOUNTER — Encounter (HOSPITAL_COMMUNITY): Payer: Self-pay | Admitting: Emergency Medicine

## 2020-02-11 ENCOUNTER — Emergency Department (HOSPITAL_COMMUNITY): Payer: Medicare HMO

## 2020-02-11 ENCOUNTER — Emergency Department (HOSPITAL_COMMUNITY)
Admission: EM | Admit: 2020-02-11 | Discharge: 2020-02-11 | Discharge status: Against Medical Advice | Payer: Medicare HMO | Attending: Emergency Medicine | Admitting: Emergency Medicine

## 2020-02-11 DIAGNOSIS — R42 Dizziness and giddiness: Secondary | ICD-10-CM | POA: Diagnosis not present

## 2020-02-11 DIAGNOSIS — R1032 Left lower quadrant pain: Secondary | ICD-10-CM | POA: Insufficient documentation

## 2020-02-11 DIAGNOSIS — R519 Headache, unspecified: Secondary | ICD-10-CM | POA: Diagnosis not present

## 2020-02-11 DIAGNOSIS — R112 Nausea with vomiting, unspecified: Secondary | ICD-10-CM | POA: Diagnosis not present

## 2020-02-11 DIAGNOSIS — Z9049 Acquired absence of other specified parts of digestive tract: Secondary | ICD-10-CM | POA: Diagnosis not present

## 2020-02-11 DIAGNOSIS — D72829 Elevated white blood cell count, unspecified: Secondary | ICD-10-CM | POA: Diagnosis not present

## 2020-02-11 DIAGNOSIS — Z7901 Long term (current) use of anticoagulants: Secondary | ICD-10-CM | POA: Insufficient documentation

## 2020-02-11 DIAGNOSIS — R531 Weakness: Secondary | ICD-10-CM | POA: Diagnosis not present

## 2020-02-11 DIAGNOSIS — R109 Unspecified abdominal pain: Secondary | ICD-10-CM | POA: Diagnosis not present

## 2020-02-11 DIAGNOSIS — R102 Pelvic and perineal pain: Secondary | ICD-10-CM | POA: Diagnosis not present

## 2020-02-11 DIAGNOSIS — R197 Diarrhea, unspecified: Secondary | ICD-10-CM | POA: Diagnosis not present

## 2020-02-11 LAB — URINALYSIS, ROUTINE W REFLEX MICROSCOPIC
Bilirubin Urine: NEGATIVE
Glucose, UA: NEGATIVE mg/dL
Hgb urine dipstick: NEGATIVE
Ketones, ur: 20 mg/dL — AB
Leukocytes,Ua: NEGATIVE
Nitrite: NEGATIVE
Protein, ur: NEGATIVE mg/dL
Specific Gravity, Urine: 1.015 (ref 1.005–1.030)
pH: 6 (ref 5.0–8.0)

## 2020-02-11 LAB — COMPREHENSIVE METABOLIC PANEL
ALT: 11 U/L (ref 0–44)
AST: 32 U/L (ref 15–41)
Albumin: 4.2 g/dL (ref 3.5–5.0)
Alkaline Phosphatase: 52 U/L (ref 38–126)
Anion gap: 15 (ref 5–15)
BUN: 12 mg/dL (ref 6–20)
CO2: 24 mmol/L (ref 22–32)
Calcium: 9.4 mg/dL (ref 8.9–10.3)
Chloride: 103 mmol/L (ref 98–111)
Creatinine, Ser: 1.11 mg/dL — ABNORMAL HIGH (ref 0.44–1.00)
GFR, Estimated: 58 mL/min — ABNORMAL LOW (ref >60)
Glucose, Bld: 148 mg/dL — ABNORMAL HIGH (ref 70–99)
Potassium: 3.5 mmol/L (ref 3.5–5.1)
Sodium: 142 mmol/L (ref 135–145)
Total Bilirubin: 0.8 mg/dL (ref 0.3–1.2)
Total Protein: 7.5 g/dL (ref 6.5–8.1)

## 2020-02-11 LAB — LIPASE, BLOOD: Lipase: 24 U/L (ref 11–51)

## 2020-02-11 LAB — CBC
HCT: 45 % (ref 36.0–46.0)
Hemoglobin: 14.3 g/dL (ref 12.0–15.0)
MCH: 26.4 pg (ref 26.0–34.0)
MCHC: 31.8 g/dL (ref 30.0–36.0)
MCV: 83.2 fL (ref 80.0–100.0)
Platelets: 202 10*3/uL (ref 150–400)
RBC: 5.41 MIL/uL — ABNORMAL HIGH (ref 3.87–5.11)
RDW: 14.4 % (ref 11.5–15.5)
WBC: 11.8 10*3/uL — ABNORMAL HIGH (ref 4.0–10.5)
nRBC: 0 % (ref 0.0–0.2)

## 2020-02-11 LAB — I-STAT BETA HCG BLOOD, ED (MC, WL, AP ONLY): I-stat hCG, quantitative: <5 m[IU]/mL (ref <5)

## 2020-02-11 MED ORDER — ONDANSETRON HCL 4 MG/2ML IJ SOLN
4.0000 mg | Freq: Once | INTRAMUSCULAR | Status: AC
  Administered 2020-02-11: 4 mg via INTRAVENOUS
  Filled 2020-02-11: qty 2

## 2020-02-11 MED ORDER — METOCLOPRAMIDE HCL 5 MG/ML IJ SOLN
10.0000 mg | Freq: Once | INTRAMUSCULAR | Status: AC
  Administered 2020-02-11: 10 mg via INTRAVENOUS
  Filled 2020-02-11: qty 2

## 2020-02-11 MED ORDER — SODIUM CHLORIDE 0.9 % IV BOLUS
1000.0000 mL | Freq: Once | INTRAVENOUS | Status: AC
  Administered 2020-02-11: 1000 mL via INTRAVENOUS

## 2020-02-11 MED ORDER — MECLIZINE HCL 25 MG PO TABS
25.0000 mg | ORAL_TABLET | Freq: Once | ORAL | Status: AC
  Administered 2020-02-11: 25 mg via ORAL
  Filled 2020-02-11: qty 1

## 2020-02-11 NOTE — ED Notes (Signed)
Patient requesting to walk. Patient reports no relief with medicine but is instant on ambulating to bathroom. 2 staff members needed to assist patient out of bed and just a few steps. Patient assisted to w/c and wheeled to bathroom where 2 staff members needs to assist her to toilet and back to room via w/c. Patient continues to refuse MRI. When this rn questioned patient regarding plan of care, patient stating "I just want to go home." Discussed this with PA and patient can either sign out ama or have mri. Patient now stating she would like mri. Call placed to MRI regarding patient. Patient then stating "im just going to leave ama." Risks and benefits explained to patient and family regarding such. Patient alert and oriented. IV lock removed.

## 2020-02-11 NOTE — ED Notes (Signed)
Patient wheeled to the waiting room as husband said he would be stepping out for a minute but is aware of patient's decision to leave AMA. Husband not in waiting. Patient alert and oriented.

## 2020-02-11 NOTE — ED Provider Notes (Cosign Needed)
[facility] EMERGENCY DEPARTMENT Provider Note   CSN: [id number] Arrival date & time: 02/11/20  0759     History Chief Complaint  Patient presents with  . Emesis    Emily Rice is a 59 y.o. female.  59 year old female with past medical history of A. fib, on Eliquis, DVT, PE, presents with complaint of abdominal pain with nausea, vomiting, dizziness and headache.  Patient states symptoms started with vomiting yesterday, has developed generalized abdominal pain and feels dizzy when she stands.  Denies fevers, chills, sick contacts, changes in bowel or bladder habits.  No other complaints or concerns although patient participates minimally in her care/history.        Past Medical History:  Diagnosis Date  . A-fib ([place])   . Anxiety   . Bipolar disorder ([place])   . DVT (deep venous thrombosis) ([place]) 2001 and 2002  . Pulmonary embolism ([place]) 2001  . Tremor     Patient Active Problem List   Diagnosis Date Noted  . Chronic migraine w/o aura w/o status migrainosus, not intractable 01/12/2020  . Bipolar affective disorder, current episode mixed ([place]) 01/12/2020  . Abnormal movement 01/12/2020  . DVT (deep venous thrombosis) ([place])     Past Surgical History:  Procedure Laterality Date  . CHOLECYSTECTOMY  2014  . OVARIAN CYST REMOVAL    . TONSILLECTOMY       OB History   No obstetric history on file.     Family History  Problem Relation Age of Onset  . Hypertension Mother   . Stroke Father   . Hypertension Father   . Atrial fibrillation Father   . Diabetes Sister   . CAD Brother   . Hypertension Maternal Uncle     Social History   Tobacco Use  . Smoking status: Never Smoker  . Smokeless tobacco: Never Used  Vaping Use  . Vaping Use: Never used  Substance Use Topics  . Alcohol use: Not Currently  . Drug use: Never    Home Medications Prior to Admission medications   Medication Sig Start Date End Date Taking? Authorizing Provider   ALPRAZolam Duanne Moron) 1 MG tablet Take 1 mg by mouth 3 (three) times daily as needed for anxiety.  08/11/19   [provider]  amantadine (SYMMETREL) 100 MG capsule Take 100 mg by mouth at bedtime. 11/20/19   [provider]  apixaban (ELIQUIS) 5 MG TABS tablet Take 5 mg by mouth 2 (two) times daily.  07/23/18   [provider]  carbidopa-levodopa (SINEMET) 25-100 MG tablet Take 1 tablet by mouth 3 (three) times daily. 01/12/20   Marcial Pacas, MD  Cholecalciferol 75 MCG (3000 UT) TABS Take 3,000 Units by mouth daily.     [provider]  diphenhydrAMINE (BENADRYL) 25 mg capsule Take 25 mg by mouth every 6 (six) hours as needed for itching or allergies.     [provider]  Erenumab-aooe (AIMOVIG) 70 MG/ML SOAJ Inject 70 mg into the skin every 30 (thirty) days. 01/12/20   Marcial Pacas, MD  Fexofenadine HCl (ALLEGRA PO) Take 1 tablet by mouth daily as needed (Allergies).    [provider]  fluticasone (FLONASE) 50 MCG/ACT nasal spray Place 1 spray into both nostrils daily as needed for allergies.     [provider]  lamoTRIgine (LAMICTAL) 100 MG tablet Take 100-200 mg by mouth See admin instructions. Take 1 tablet in the morning and take 2 tablets at night 07/23/18   [provider]  metoprolol succinate (TOPROL XL) 25 MG 24 hr tablet Take 12.5 mg by mouth daily.    [provider]  Multiple Vitamin (MULTI-VITAMIN) tablet Take 1 tablet by mouth daily.    [provider]  zolmitriptan (ZOMIG) 5 MG tablet Take 1 tab at onset of migraine.  May repeat in 2 hrs, if needed.  Max dose: 2 tabs/day. This is a 30 day prescription. 01/12/20   Marcial Pacas, MD    Allergies    Hydromorphone, Iodinated diagnostic agents, Prochlorperazine, and Prochlorperazine edisylate  Review of Systems   Review of Systems  Constitutional: Negative for chills and fever.  Eyes: Negative for visual disturbance.  Respiratory: Negative for shortness of  breath.   Cardiovascular: Negative for chest pain.  Gastrointestinal: Positive for abdominal pain, nausea and vomiting. Negative for constipation and diarrhea.  Genitourinary: Negative for dysuria and frequency.  Musculoskeletal: Negative for arthralgias and myalgias.  Skin: Negative for rash and wound.  Allergic/Immunologic: Negative for immunocompromised state.  Neurological: Positive for dizziness and headaches. Negative for speech difficulty and weakness.  All other systems reviewed and are negative.   Physical Exam Updated Vital Signs BP (!) 117/52 (BP Location: Right Arm)   Pulse 71   Temp 97.9 F (36.6 C) (Oral)   Resp (!) 24   Ht 5\' 3"  (1.6 m)   Wt 72.1 kg   SpO2 99%   BMI 28.17 kg/m   Physical Exam Vitals and nursing note reviewed.  Constitutional:      General: She is not in acute distress.    Appearance: She is well-developed. She is not diaphoretic.  HENT:     Head: Normocephalic and atraumatic.  Cardiovascular:     Rate and Rhythm: Normal rate and regular rhythm.     Pulses: Normal pulses.     Heart sounds: Normal heart sounds.  Pulmonary:     Effort: Pulmonary effort is normal.     Breath sounds: Normal breath sounds.  Abdominal:     Palpations: Abdomen is soft.     Tenderness: There is abdominal tenderness in the left lower quadrant.     Comments: Mild generalized abdominal pain, worse in LLQ.  Neurological:     Mental Status: She is alert and oriented to person, place, and time.  Psychiatric:        Behavior: Behavior is uncooperative.     ED Results / Procedures / Treatments   Labs (all labs ordered are listed, but only abnormal results are displayed) Labs Reviewed  COMPREHENSIVE METABOLIC PANEL - Abnormal; Notable for the following components:      Result Value   Glucose, Bld 148 (*)    Creatinine, Ser 1.11 (*)    GFR, Estimated 58 (*)    All other components within normal limits  CBC - Abnormal; Notable for the following components:   WBC  11.8 (*)    RBC 5.41 (*)    All other components within normal limits  LIPASE, BLOOD  URINALYSIS, ROUTINE W REFLEX MICROSCOPIC  I-STAT BETA HCG BLOOD, ED ([facility], [facility], [facility] ONLY)    EKG None  Radiology No results found.  Procedures Procedures (including critical care time)  Medications Ordered in ED Medications  sodium chloride 0.9 % bolus 1,000 mL (0 mLs Intravenous Stopped 02/11/20 1336)  ondansetron (ZOFRAN) injection 4 mg (4 mg Intravenous Given 02/11/20 1210)    ED Course  I have reviewed the triage vital signs and the nursing notes.  Pertinent labs & imaging results that were available  during my care of the patient were reviewed by me and considered in my medical decision making (see chart for details).  Clinical Course as of Feb 10 1500  Thu Dec 02, 23  3093 59 year old female with complaint of nausea, vomiting, dizziness and headache. History and physical complicated by patient intermittently cooperative, prefers to keep her eyes closed, won't open eyes and mumbles however has clear speech when yelling at staff demanding to be placed in a room.    [LM]  0102 On exam, found to have tenderness in the LLQ. Plan is for CT abdomen and pelvis to evaluate for diverticulitis or other source of her symptoms. Due to headache and dizziness on Eliquis, CT head added after discussion with Dr. Billy Fischer, ER attending.  Labs with mild leukocytosis at 11.8, CMP without significant changes, hcg negative. UA pending. Vitals unremarkable.    [LM]  1501 Care signed out pending imaging and reassessment.    [LM]    Clinical Course User Index [LM] Roque Lias   MDM Rules/Calculators/A&P                          Final Clinical Impression(s) / ED Diagnoses Final diagnoses:  None    Rx / DC Orders ED Discharge Orders    None       Tacy Learn, PA-C 02/11/20 1502

## 2020-02-11 NOTE — ED Triage Notes (Signed)
Pt arrives with c/o vomiting since yesterday, she also continues to state that her "eyes are crossed" while in triage. Pt spitting into her mask despite staff providing emesis bag, poor historian.

## 2020-02-11 NOTE — ED Provider Notes (Signed)
Care assumed from Emily Broad, PA-C, at shift change, please see their notes for full documentation of patient's complaint/HPI. Briefly, Emily Rice here with complaint of nausea, NBNB emesis, dizziness, and headache. Results so far show negative CT scan, mild leukocytosis of 11.8. Awaiting CT A/P given some LLQ abdominal TTP. Plan is to reevaluate after CT scan; Emily Rice needs to ambulate for safety prior to discharge.   Physical Exam  BP (!) 117/52 (BP Location: Right Arm)   Pulse 71   Temp 97.9 F (36.6 C) (Oral)   Resp (!) 24   Ht 5\' 3"  (1.6 m)   Wt 72.1 kg   SpO2 99%   BMI 28.17 kg/m   Physical Exam Vitals and nursing note reviewed.  Constitutional:      Appearance: She is not ill-appearing.     Comments: Uncooperative for exam  HENT:     Head: Normocephalic and atraumatic.  Eyes:     Conjunctiva/sclera: Conjunctivae normal.  Cardiovascular:     Rate and Rhythm: Normal rate and regular rhythm.  Pulmonary:     Effort: Pulmonary effort is normal.     Breath sounds: Normal breath sounds.  Skin:    General: Skin is warm and dry.     Coloration: Skin is not jaundiced.  Neurological:     Mental Status: She is alert.     ED Course/Procedures   Clinical Course as of Feb 10 1646  Thu Feb 11, 7867  [id number] 59 year old female with complaint of nausea, vomiting, dizziness and headache. History and physical complicated by patient intermittently cooperative, prefers to keep her eyes closed, won't open eyes and mumbles however has clear speech when yelling at staff demanding to be placed in a room.    [LM]  4268 On exam, found to have tenderness in the LLQ. Plan is for CT abdomen and pelvis to evaluate for diverticulitis or other source of her symptoms. Due to headache and dizziness on Eliquis, CT head added after discussion with Dr. Billy Fischer, ER attending.  Labs with mild leukocytosis at 11.8, CMP without significant changes, hcg negative. UA pending. Vitals unremarkable.    [LM]  1501 Care  signed out pending imaging and reassessment.    [LM]    Clinical Course User Index [LM] Tacy Learn, PA-C    Procedures  Results for orders placed or performed during the hospital encounter of 02/11/20  Lipase, blood  Result Value Ref Range   Lipase 24 11 - 51 U/L  Comprehensive metabolic panel  Result Value Ref Range   Sodium 142 135 - 145 mmol/L   Potassium 3.5 3.5 - 5.1 mmol/L   Chloride 103 98 - 111 mmol/L   CO2 24 22 - 32 mmol/L   Glucose, Bld 148 (H) 70 - 99 mg/dL   BUN 12 6 - 20 mg/dL   Creatinine, Ser 1.11 (H) 0.44 - 1.00 mg/dL   Calcium 9.4 8.9 - 10.3 mg/dL   Total Protein 7.5 6.5 - 8.1 g/dL   Albumin 4.2 3.5 - 5.0 g/dL   AST 32 15 - 41 U/L   ALT 11 0 - 44 U/L   Alkaline Phosphatase 52 38 - 126 U/L   Total Bilirubin 0.8 0.3 - 1.2 mg/dL   GFR, Estimated 58 (L) >60 mL/min   Anion gap 15 5 - 15  CBC  Result Value Ref Range   WBC 11.8 (H) 4.0 - 10.5 K/uL   RBC 5.41 (H) 3.87 - 5.11 MIL/uL   Hemoglobin 14.3 12.0 -  15.0 g/dL   HCT 45.0 36 - 46 %   MCV 83.2 80.0 - 100.0 fL   MCH 26.4 26.0 - 34.0 pg   MCHC 31.8 30.0 - 36.0 g/dL   RDW 14.4 11.5 - 15.5 %   Platelets 202 150 - 400 K/uL   nRBC 0.0 0.0 - 0.2 %  Urinalysis, Routine w reflex microscopic Urine, Clean Catch  Result Value Ref Range   Color, Urine YELLOW YELLOW   APPearance HAZY (A) CLEAR   Specific Gravity, Urine 1.015 1.005 - 1.030   pH 6.0 5.0 - 8.0   Glucose, UA NEGATIVE NEGATIVE mg/dL   Hgb urine dipstick NEGATIVE NEGATIVE   Bilirubin Urine NEGATIVE NEGATIVE   Ketones, ur 20 (A) NEGATIVE mg/dL   Protein, ur NEGATIVE NEGATIVE mg/dL   Nitrite NEGATIVE NEGATIVE   Leukocytes,Ua NEGATIVE NEGATIVE  I-Stat beta hCG blood, ED  Result Value Ref Range   I-stat hCG, quantitative <5.0 <5 mIU/mL   Comment 3           CT Abdomen Pelvis Wo Contrast  Result Date: 02/11/2020 CLINICAL DATA:  Abdominal and pelvic pain radiating into the low back and down both legs. EXAM: CT ABDOMEN AND PELVIS WITHOUT  CONTRAST TECHNIQUE: Multidetector CT imaging of the abdomen and pelvis was performed following the standard protocol without IV contrast. COMPARISON:  None. FINDINGS: Lower chest: Mild dependent atelectasis. No pleural or pericardial effusion. Hepatobiliary: No focal liver abnormality is seen. Status post cholecystectomy. No biliary dilatation. Pancreas: Unremarkable. No pancreatic ductal dilatation or surrounding inflammatory changes. Spleen: Normal in size without focal abnormality. Adrenals/Urinary Tract: Adrenal glands are unremarkable. Kidneys are normal, without renal calculi, focal lesion, or hydronephrosis. Bladder is unremarkable. The urinary bladder is moderately distended but otherwise unremarkable. Stomach/Bowel: Stomach is within normal limits. Appendix appears normal. No evidence of bowel wall thickening, distention, or inflammatory changes. Vascular/Lymphatic: No significant vascular findings are present. No enlarged abdominal or pelvic lymph nodes. Reproductive: Uterus and bilateral adnexa are unremarkable. Other: None. Musculoskeletal: No acute or focal abnormality. IMPRESSION: Moderate distention of the urinary bladder is likely incidental. The examination is otherwise unremarkable. No finding to explain the patient's symptoms. Electronically Signed   By: Inge Rise M.D.   On: 02/11/2020 15:00   CT Head Wo Contrast  Result Date: 02/11/2020 CLINICAL DATA:  Headache and vomiting EXAM: CT HEAD WITHOUT CONTRAST TECHNIQUE: Contiguous axial images were obtained from the base of the skull through the vertex without intravenous contrast. COMPARISON:  MRI head 12/10/2019 FINDINGS: Brain: No evidence of acute infarction, hemorrhage, hydrocephalus, extra-axial collection or mass lesion/mass effect. Vascular: Negative for hyperdense vessel Skull: Negative Sinuses/Orbits: Mild mucosal edema paranasal sinuses.  Normal orbit Other: None IMPRESSION: Negative CT head Electronically Signed   By: Franchot Gallo M.D.   On: 02/11/2020 14:56    MDM  CT A/P without acute findings besides moderate distention of the urinary bladder; U/A without infection.   Nursing staff informed that Emily Rice requesting something more for nausea however has not vomited. Attempted to fluid challenge patient however she is unwilling to drink more than 1 sip of water despite multiple attempts to motivate patient. We also attempted to ambulate patient however she is again unwilling to try as she states "I can't walk because I am so nauseated." Will provide reglan at this time as Emily Rice is allergic to phenergan and will MRI brain to assess for posterior stroke.   When patient was called from MRI she reported that she now wanted to  try to ambulate and if she could then she did not want the MRI. Nursing staff and myself attempted to ambulate patient however Emily Rice very unsteady on her feet. She reported feeling like the room was spinning. Will provide meclizine at this time with plan for MRI.   Emily Rice proceeded to continue changing her mind about wanting the MRI vs going home. She was ultimately told that if she wanted to leave she would need to sign out [place]. She understood the risks involved of not getting the imaging and the potential for worsening symptoms if she did have a stroke and decided to leave AMA anyways.     Eustaquio Maize, PA-C 02/12/20 0006    Davonna Belling, MD 02/12/20 [phone]

## 2020-02-19 DIAGNOSIS — G43109 Migraine with aura, not intractable, without status migrainosus: Secondary | ICD-10-CM | POA: Diagnosis not present

## 2020-02-19 DIAGNOSIS — G2401 Drug induced subacute dyskinesia: Secondary | ICD-10-CM | POA: Diagnosis not present

## 2020-02-19 DIAGNOSIS — F4312 Post-traumatic stress disorder, chronic: Secondary | ICD-10-CM | POA: Diagnosis not present

## 2020-02-19 DIAGNOSIS — F3181 Bipolar II disorder: Secondary | ICD-10-CM | POA: Diagnosis not present

## 2020-03-02 ENCOUNTER — Emergency Department (HOSPITAL_COMMUNITY): Payer: Medicare HMO

## 2020-03-02 ENCOUNTER — Inpatient Hospital Stay (HOSPITAL_COMMUNITY)
Admission: EM | Admit: 2020-03-02 | Discharge: 2020-03-08 | DRG: 958 | Disposition: A | Discharge status: Rehabilitation Facility | Payer: Medicare HMO | Attending: Surgery | Admitting: Surgery

## 2020-03-02 ENCOUNTER — Encounter (HOSPITAL_COMMUNITY): Payer: Self-pay

## 2020-03-02 DIAGNOSIS — M21331 Wrist drop, right wrist: Secondary | ICD-10-CM | POA: Diagnosis present

## 2020-03-02 DIAGNOSIS — S066X1A Traumatic subarachnoid hemorrhage with loss of consciousness of 30 minutes or less, initial encounter: Secondary | ICD-10-CM | POA: Diagnosis not present

## 2020-03-02 DIAGNOSIS — I493 Ventricular premature depolarization: Secondary | ICD-10-CM | POA: Diagnosis not present

## 2020-03-02 DIAGNOSIS — M791 Myalgia, unspecified site: Secondary | ICD-10-CM | POA: Diagnosis not present

## 2020-03-02 DIAGNOSIS — Z86718 Personal history of other venous thrombosis and embolism: Secondary | ICD-10-CM | POA: Diagnosis not present

## 2020-03-02 DIAGNOSIS — R008 Other abnormalities of heart beat: Secondary | ICD-10-CM | POA: Diagnosis not present

## 2020-03-02 DIAGNOSIS — T1490XA Injury, unspecified, initial encounter: Secondary | ICD-10-CM

## 2020-03-02 DIAGNOSIS — R404 Transient alteration of awareness: Secondary | ICD-10-CM | POA: Diagnosis not present

## 2020-03-02 DIAGNOSIS — F319 Bipolar disorder, unspecified: Secondary | ICD-10-CM | POA: Diagnosis present

## 2020-03-02 DIAGNOSIS — S42201D Unspecified fracture of upper end of right humerus, subsequent encounter for fracture with routine healing: Secondary | ICD-10-CM | POA: Diagnosis not present

## 2020-03-02 DIAGNOSIS — Z7901 Long term (current) use of anticoagulants: Secondary | ICD-10-CM

## 2020-03-02 DIAGNOSIS — F419 Anxiety disorder, unspecified: Secondary | ICD-10-CM | POA: Diagnosis not present

## 2020-03-02 DIAGNOSIS — S199XXA Unspecified injury of neck, initial encounter: Secondary | ICD-10-CM | POA: Diagnosis not present

## 2020-03-02 DIAGNOSIS — S42351A Displaced comminuted fracture of shaft of humerus, right arm, initial encounter for closed fracture: Secondary | ICD-10-CM | POA: Diagnosis not present

## 2020-03-02 DIAGNOSIS — Z79899 Other long term (current) drug therapy: Secondary | ICD-10-CM | POA: Diagnosis not present

## 2020-03-02 DIAGNOSIS — S069X0D Unspecified intracranial injury without loss of consciousness, subsequent encounter: Secondary | ICD-10-CM | POA: Diagnosis not present

## 2020-03-02 DIAGNOSIS — J939 Pneumothorax, unspecified: Secondary | ICD-10-CM

## 2020-03-02 DIAGNOSIS — S0081XA Abrasion of other part of head, initial encounter: Secondary | ICD-10-CM | POA: Diagnosis present

## 2020-03-02 DIAGNOSIS — S81011A Laceration without foreign body, right knee, initial encounter: Secondary | ICD-10-CM | POA: Diagnosis not present

## 2020-03-02 DIAGNOSIS — S42301A Unspecified fracture of shaft of humerus, right arm, initial encounter for closed fracture: Secondary | ICD-10-CM | POA: Diagnosis not present

## 2020-03-02 DIAGNOSIS — Z20822 Contact with and (suspected) exposure to covid-19: Secondary | ICD-10-CM | POA: Diagnosis not present

## 2020-03-02 DIAGNOSIS — R109 Unspecified abdominal pain: Secondary | ICD-10-CM | POA: Diagnosis not present

## 2020-03-02 DIAGNOSIS — I609 Nontraumatic subarachnoid hemorrhage, unspecified: Secondary | ICD-10-CM

## 2020-03-02 DIAGNOSIS — D62 Acute posthemorrhagic anemia: Secondary | ICD-10-CM | POA: Diagnosis not present

## 2020-03-02 DIAGNOSIS — S299XXA Unspecified injury of thorax, initial encounter: Secondary | ICD-10-CM | POA: Diagnosis not present

## 2020-03-02 DIAGNOSIS — S2241XA Multiple fractures of ribs, right side, initial encounter for closed fracture: Secondary | ICD-10-CM | POA: Diagnosis not present

## 2020-03-02 DIAGNOSIS — S42309A Unspecified fracture of shaft of humerus, unspecified arm, initial encounter for closed fracture: Secondary | ICD-10-CM

## 2020-03-02 DIAGNOSIS — Z86711 Personal history of pulmonary embolism: Secondary | ICD-10-CM

## 2020-03-02 DIAGNOSIS — Y92410 Unspecified street and highway as the place of occurrence of the external cause: Secondary | ICD-10-CM | POA: Diagnosis not present

## 2020-03-02 DIAGNOSIS — Z888 Allergy status to other drugs, medicaments and biological substances status: Secondary | ICD-10-CM | POA: Diagnosis not present

## 2020-03-02 DIAGNOSIS — G43709 Chronic migraine without aura, not intractable, without status migrainosus: Secondary | ICD-10-CM | POA: Diagnosis not present

## 2020-03-02 DIAGNOSIS — S022XXA Fracture of nasal bones, initial encounter for closed fracture: Secondary | ICD-10-CM | POA: Diagnosis not present

## 2020-03-02 DIAGNOSIS — R000 Tachycardia, unspecified: Secondary | ICD-10-CM | POA: Diagnosis not present

## 2020-03-02 DIAGNOSIS — I4891 Unspecified atrial fibrillation: Secondary | ICD-10-CM | POA: Diagnosis not present

## 2020-03-02 DIAGNOSIS — S42361A Displaced segmental fracture of shaft of humerus, right arm, initial encounter for closed fracture: Secondary | ICD-10-CM | POA: Diagnosis not present

## 2020-03-02 DIAGNOSIS — S066X9A Traumatic subarachnoid hemorrhage with loss of consciousness of unspecified duration, initial encounter: Secondary | ICD-10-CM | POA: Diagnosis not present

## 2020-03-02 DIAGNOSIS — F4312 Post-traumatic stress disorder, chronic: Secondary | ICD-10-CM | POA: Diagnosis not present

## 2020-03-02 DIAGNOSIS — S42291A Other displaced fracture of upper end of right humerus, initial encounter for closed fracture: Secondary | ICD-10-CM | POA: Diagnosis not present

## 2020-03-02 DIAGNOSIS — S022XXD Fracture of nasal bones, subsequent encounter for fracture with routine healing: Secondary | ICD-10-CM | POA: Diagnosis not present

## 2020-03-02 DIAGNOSIS — Z9889 Other specified postprocedural states: Secondary | ICD-10-CM | POA: Diagnosis not present

## 2020-03-02 DIAGNOSIS — R7401 Elevation of levels of liver transaminase levels: Secondary | ICD-10-CM | POA: Diagnosis not present

## 2020-03-02 DIAGNOSIS — R410 Disorientation, unspecified: Secondary | ICD-10-CM | POA: Diagnosis not present

## 2020-03-02 DIAGNOSIS — I480 Paroxysmal atrial fibrillation: Secondary | ICD-10-CM | POA: Diagnosis not present

## 2020-03-02 DIAGNOSIS — S066X9D Traumatic subarachnoid hemorrhage with loss of consciousness of unspecified duration, subsequent encounter: Secondary | ICD-10-CM | POA: Diagnosis not present

## 2020-03-02 DIAGNOSIS — S270XXA Traumatic pneumothorax, initial encounter: Secondary | ICD-10-CM | POA: Diagnosis present

## 2020-03-02 DIAGNOSIS — R456 Violent behavior: Secondary | ICD-10-CM | POA: Diagnosis not present

## 2020-03-02 DIAGNOSIS — S6421XA Injury of radial nerve at wrist and hand level of right arm, initial encounter: Secondary | ICD-10-CM | POA: Diagnosis present

## 2020-03-02 DIAGNOSIS — S42201A Unspecified fracture of upper end of right humerus, initial encounter for closed fracture: Secondary | ICD-10-CM | POA: Diagnosis present

## 2020-03-02 DIAGNOSIS — Z23 Encounter for immunization: Secondary | ICD-10-CM

## 2020-03-02 DIAGNOSIS — Z781 Physical restraint status: Secondary | ICD-10-CM | POA: Diagnosis not present

## 2020-03-02 DIAGNOSIS — Y9301 Activity, walking, marching and hiking: Secondary | ICD-10-CM | POA: Diagnosis present

## 2020-03-02 DIAGNOSIS — R0689 Other abnormalities of breathing: Secondary | ICD-10-CM | POA: Diagnosis not present

## 2020-03-02 DIAGNOSIS — Z885 Allergy status to narcotic agent status: Secondary | ICD-10-CM | POA: Diagnosis not present

## 2020-03-02 DIAGNOSIS — G2401 Drug induced subacute dyskinesia: Secondary | ICD-10-CM | POA: Diagnosis not present

## 2020-03-02 DIAGNOSIS — S8991XA Unspecified injury of right lower leg, initial encounter: Secondary | ICD-10-CM | POA: Diagnosis not present

## 2020-03-02 DIAGNOSIS — S42334S Nondisplaced oblique fracture of shaft of humerus, right arm, sequela: Secondary | ICD-10-CM | POA: Diagnosis not present

## 2020-03-02 DIAGNOSIS — G6289 Other specified polyneuropathies: Secondary | ICD-10-CM | POA: Diagnosis present

## 2020-03-02 DIAGNOSIS — I959 Hypotension, unspecified: Secondary | ICD-10-CM | POA: Diagnosis not present

## 2020-03-02 DIAGNOSIS — S066X0A Traumatic subarachnoid hemorrhage without loss of consciousness, initial encounter: Secondary | ICD-10-CM | POA: Diagnosis not present

## 2020-03-02 DIAGNOSIS — S42294S Other nondisplaced fracture of upper end of right humerus, sequela: Secondary | ICD-10-CM | POA: Diagnosis not present

## 2020-03-02 DIAGNOSIS — S42301D Unspecified fracture of shaft of humerus, right arm, subsequent encounter for fracture with routine healing: Secondary | ICD-10-CM | POA: Diagnosis not present

## 2020-03-02 DIAGNOSIS — F3181 Bipolar II disorder: Secondary | ICD-10-CM | POA: Diagnosis not present

## 2020-03-02 DIAGNOSIS — S3993XA Unspecified injury of pelvis, initial encounter: Secondary | ICD-10-CM | POA: Diagnosis not present

## 2020-03-02 DIAGNOSIS — S81011D Laceration without foreign body, right knee, subsequent encounter: Secondary | ICD-10-CM | POA: Diagnosis not present

## 2020-03-02 DIAGNOSIS — R52 Pain, unspecified: Secondary | ICD-10-CM | POA: Diagnosis not present

## 2020-03-02 DIAGNOSIS — F3160 Bipolar disorder, current episode mixed, unspecified: Secondary | ICD-10-CM | POA: Diagnosis not present

## 2020-03-02 HISTORY — DX: Migraine, unspecified, not intractable, without status migrainosus: G43.909

## 2020-03-02 HISTORY — DX: Depression, unspecified: F32.A

## 2020-03-02 LAB — I-STAT CHEM 8, ED
BUN: 10 mg/dL (ref 6–20)
Calcium, Ion: 1.09 mmol/L — ABNORMAL LOW (ref 1.15–1.40)
Chloride: 101 mmol/L (ref 98–111)
Creatinine, Ser: 1.1 mg/dL — ABNORMAL HIGH (ref 0.44–1.00)
Glucose, Bld: 144 mg/dL — ABNORMAL HIGH (ref 70–99)
HCT: 43 % (ref 36.0–46.0)
Hemoglobin: 14.6 g/dL (ref 12.0–15.0)
Potassium: 4 mmol/L (ref 3.5–5.1)
Sodium: 140 mmol/L (ref 135–145)
TCO2: 25 mmol/L (ref 22–32)

## 2020-03-02 LAB — CBC
HCT: 41.9 % (ref 36.0–46.0)
Hemoglobin: 13.3 g/dL (ref 12.0–15.0)
MCH: 26.4 pg (ref 26.0–34.0)
MCHC: 31.7 g/dL (ref 30.0–36.0)
MCV: 83.1 fL (ref 80.0–100.0)
Platelets: 199 10*3/uL (ref 150–400)
RBC: 5.04 MIL/uL (ref 3.87–5.11)
RDW: 14.5 % (ref 11.5–15.5)
WBC: 9.6 10*3/uL (ref 4.0–10.5)
nRBC: 0 % (ref 0.0–0.2)

## 2020-03-02 LAB — COMPREHENSIVE METABOLIC PANEL
ALT: 16 U/L (ref 0–44)
AST: 113 U/L — ABNORMAL HIGH (ref 15–41)
Albumin: 3.6 g/dL (ref 3.5–5.0)
Alkaline Phosphatase: 52 U/L (ref 38–126)
Anion gap: 13 (ref 5–15)
BUN: 10 mg/dL (ref 6–20)
CO2: 24 mmol/L (ref 22–32)
Calcium: 8.6 mg/dL — ABNORMAL LOW (ref 8.9–10.3)
Chloride: 101 mmol/L (ref 98–111)
Creatinine, Ser: 1.23 mg/dL — ABNORMAL HIGH (ref 0.44–1.00)
GFR, Estimated: 51 mL/min — ABNORMAL LOW (ref >60)
Glucose, Bld: 150 mg/dL — ABNORMAL HIGH (ref 70–99)
Potassium: 4 mmol/L (ref 3.5–5.1)
Sodium: 138 mmol/L (ref 135–145)
Total Bilirubin: 0.7 mg/dL (ref 0.3–1.2)
Total Protein: 6.5 g/dL (ref 6.5–8.1)

## 2020-03-02 LAB — SAMPLE TO BLOOD BANK

## 2020-03-02 LAB — ETHANOL: Alcohol, Ethyl (B): <10 mg/dL (ref <10)

## 2020-03-02 LAB — RESP PANEL BY RT-PCR (FLU A&B, COVID) ARPGX2
Influenza A by PCR: NEGATIVE
Influenza B by PCR: NEGATIVE
SARS Coronavirus 2 by RT PCR: NEGATIVE

## 2020-03-02 LAB — PROTIME-INR
INR: 1.1 (ref 0.8–1.2)
Prothrombin Time: 13.8 seconds (ref 11.4–15.2)

## 2020-03-02 LAB — I-STAT BETA HCG BLOOD, ED (MC, WL, AP ONLY): I-stat hCG, quantitative: <5 m[IU]/mL (ref <5)

## 2020-03-02 LAB — LACTIC ACID, PLASMA: Lactic Acid, Venous: 3.8 mmol/L (ref 0.5–1.9)

## 2020-03-02 MED ORDER — HYDROMORPHONE HCL 1 MG/ML IJ SOLN
1.0000 mg | Freq: Once | INTRAMUSCULAR | Status: DC
  Filled 2020-03-02: qty 1

## 2020-03-02 MED ORDER — PANTOPRAZOLE SODIUM 40 MG PO TBEC: 40.0000 mg | DELAYED_RELEASE_TABLET | Freq: Every day | ORAL | Status: DC

## 2020-03-02 MED ORDER — ONDANSETRON HCL 4 MG/2ML IJ SOLN
4.0000 mg | Freq: Four times a day (QID) | INTRAMUSCULAR | Status: DC | PRN
  Administered 2020-03-04 (×2): 4 mg via INTRAVENOUS
  Filled 2020-03-02 (×4): qty 2

## 2020-03-02 MED ORDER — ONDANSETRON 4 MG PO TBDP: 4.0000 mg | ORAL_TABLET | Freq: Four times a day (QID) | ORAL | Status: DC | PRN

## 2020-03-02 MED ORDER — FENTANYL CITRATE (PF) 100 MCG/2ML IJ SOLN
INTRAMUSCULAR | Status: AC | PRN
  Administered 2020-03-02: 100 ug via INTRAVENOUS

## 2020-03-02 MED ORDER — FENTANYL CITRATE (PF) 100 MCG/2ML IJ SOLN
100.0000 ug | Freq: Once | INTRAMUSCULAR | Status: AC
  Administered 2020-03-02: 100 ug via INTRAVENOUS
  Filled 2020-03-02: qty 2

## 2020-03-02 MED ORDER — DEXTROSE-NACL 5-0.9 % IV SOLN: INTRAVENOUS | Status: DC

## 2020-03-02 MED ORDER — FENTANYL CITRATE (PF) 100 MCG/2ML IJ SOLN: 75.0000 ug | Freq: Once | INTRAMUSCULAR | Status: DC

## 2020-03-02 MED ORDER — DEXAMETHASONE SODIUM PHOSPHATE 10 MG/ML IJ SOLN
10.0000 mg | Freq: Once | INTRAMUSCULAR | Status: AC
  Administered 2020-03-02: 10 mg via INTRAVENOUS
  Filled 2020-03-02: qty 1

## 2020-03-02 MED ORDER — TETANUS-DIPHTH-ACELL PERTUSSIS 5-2.5-18.5 LF-MCG/0.5 IM SUSY
0.5000 mL | PREFILLED_SYRINGE | Freq: Once | INTRAMUSCULAR | Status: AC
  Administered 2020-03-02: 0.5 mL via INTRAMUSCULAR
  Filled 2020-03-02: qty 0.5

## 2020-03-02 MED ORDER — IOHEXOL 300 MG/ML  SOLN
100.0000 mL | Freq: Once | INTRAMUSCULAR | Status: AC | PRN
  Administered 2020-03-02: 100 mL via INTRAVENOUS

## 2020-03-02 MED ORDER — ONDANSETRON HCL 4 MG/2ML IJ SOLN
INTRAMUSCULAR | Status: AC
  Filled 2020-03-02: qty 2

## 2020-03-02 MED ORDER — MORPHINE SULFATE (PF) 2 MG/ML IV SOLN
2.0000 mg | INTRAVENOUS | Status: DC | PRN
Start: 2020-03-02 — End: 2020-03-02

## 2020-03-02 MED ORDER — LEVETIRACETAM IN NACL 1000 MG/100ML IV SOLN: 1000.0000 mg | Freq: Once | INTRAVENOUS | Status: DC

## 2020-03-02 MED ORDER — PANTOPRAZOLE SODIUM 40 MG IV SOLR
40.0000 mg | Freq: Every day | INTRAVENOUS | Status: DC
  Administered 2020-03-02 – 2020-03-03 (×2): 40 mg via INTRAVENOUS
  Filled 2020-03-02 (×2): qty 40

## 2020-03-02 NOTE — Progress Notes (Signed)
Ortho Trauma Note  Received consult from Dr. Rosendo Gros. Patient with segmental humerus fracture will tentatively plan ORIF tomorrow pending neurosurgical clearance. NPO after midnight.  Shona Needles, MD [facility] [phone] (office) [email or web address]
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
## 2020-03-02 NOTE — Progress Notes (Signed)
   03/02/20 1818  Clinical Encounter Type  Visit Type Trauma  Referral From  (Page)  Consult/Referral To Chaplain  Chaplain responded to Level 1, Pt was stable and Med Team working diligently on the patient. No family was present and Chaplain was not needed at this time.   Chaplain Vincella Morgan-Simpson [phone]
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
## 2020-03-02 NOTE — Progress Notes (Signed)
Orthopedic Tech Progress Note Patient Details:  Emily Rice 1960-03-29 [id number]  Ortho Devices Type of Ortho Device: Coapt Ortho Device/Splint Location: Right Arm Ortho Device/Splint Interventions: Application,Adjustment   Post Interventions Patient Tolerated: Well   Linus Salmons Magdelyn Roebuck 03/02/2020, 8:36 PM

## 2020-03-03 ENCOUNTER — Inpatient Hospital Stay (HOSPITAL_COMMUNITY): Payer: Medicare HMO

## 2020-03-03 ENCOUNTER — Inpatient Hospital Stay (HOSPITAL_COMMUNITY): Payer: Medicare HMO | Admitting: Certified Registered"

## 2020-03-03 ENCOUNTER — Encounter (HOSPITAL_COMMUNITY): Admission: EM | Disposition: A | Discharge status: Rehabilitation Facility | Payer: Self-pay | Source: Home / Self Care

## 2020-03-03 ENCOUNTER — Encounter (HOSPITAL_COMMUNITY): Payer: Self-pay

## 2020-03-03 DIAGNOSIS — S42201A Unspecified fracture of upper end of right humerus, initial encounter for closed fracture: Secondary | ICD-10-CM

## 2020-03-03 DIAGNOSIS — S42301A Unspecified fracture of shaft of humerus, right arm, initial encounter for closed fracture: Secondary | ICD-10-CM

## 2020-03-03 DIAGNOSIS — S81011A Laceration without foreign body, right knee, initial encounter: Secondary | ICD-10-CM

## 2020-03-03 DIAGNOSIS — I609 Nontraumatic subarachnoid hemorrhage, unspecified: Secondary | ICD-10-CM

## 2020-03-03 HISTORY — PX: HUMERUS IM NAIL: SHX1769

## 2020-03-03 LAB — URINALYSIS, ROUTINE W REFLEX MICROSCOPIC
Bacteria, UA: NONE SEEN
Bilirubin Urine: NEGATIVE
Glucose, UA: NEGATIVE mg/dL
Ketones, ur: NEGATIVE mg/dL
Leukocytes,Ua: NEGATIVE
Nitrite: NEGATIVE
Protein, ur: 30 mg/dL — AB
Specific Gravity, Urine: 1.042 — ABNORMAL HIGH (ref 1.005–1.030)
pH: 6 (ref 5.0–8.0)

## 2020-03-03 LAB — CBC
HCT: 36.6 % (ref 36.0–46.0)
Hemoglobin: 11.9 g/dL — ABNORMAL LOW (ref 12.0–15.0)
MCH: 26.9 pg (ref 26.0–34.0)
MCHC: 32.5 g/dL (ref 30.0–36.0)
MCV: 82.8 fL (ref 80.0–100.0)
Platelets: 170 10*3/uL (ref 150–400)
RBC: 4.42 MIL/uL (ref 3.87–5.11)
RDW: 14.5 % (ref 11.5–15.5)
WBC: 15.6 10*3/uL — ABNORMAL HIGH (ref 4.0–10.5)
nRBC: 0 % (ref 0.0–0.2)

## 2020-03-03 LAB — BASIC METABOLIC PANEL
Anion gap: 14 (ref 5–15)
BUN: 12 mg/dL (ref 6–20)
CO2: 22 mmol/L (ref 22–32)
Calcium: 8.5 mg/dL — ABNORMAL LOW (ref 8.9–10.3)
Chloride: 106 mmol/L (ref 98–111)
Creatinine, Ser: 1.14 mg/dL — ABNORMAL HIGH (ref 0.44–1.00)
GFR, Estimated: 56 mL/min — ABNORMAL LOW (ref >60)
Glucose, Bld: 153 mg/dL — ABNORMAL HIGH (ref 70–99)
Potassium: 4.1 mmol/L (ref 3.5–5.1)
Sodium: 142 mmol/L (ref 135–145)

## 2020-03-03 LAB — HIV ANTIBODY (ROUTINE TESTING W REFLEX): HIV Screen 4th Generation wRfx: NONREACTIVE

## 2020-03-03 LAB — MRSA PCR SCREENING: MRSA by PCR: NEGATIVE

## 2020-03-03 SURGERY — INSERTION, INTRAMEDULLARY ROD, HUMERUS
Anesthesia: General | Site: Arm Upper | Laterality: Right

## 2020-03-03 MED ORDER — TRAMADOL HCL 50 MG PO TABS: 50.0000 mg | ORAL_TABLET | Freq: Four times a day (QID) | ORAL | Status: DC | PRN

## 2020-03-03 MED ORDER — DEXAMETHASONE SODIUM PHOSPHATE 10 MG/ML IJ SOLN
INTRAMUSCULAR | Status: DC | PRN
  Administered 2020-03-03: 10 mg via INTRAVENOUS

## 2020-03-03 MED ORDER — FENTANYL CITRATE (PF) 250 MCG/5ML IJ SOLN
INTRAMUSCULAR | Status: AC
  Filled 2020-03-03: qty 5

## 2020-03-03 MED ORDER — CEFAZOLIN SODIUM-DEXTROSE 2-3 GM-%(50ML) IV SOLR
INTRAVENOUS | Status: DC | PRN
  Administered 2020-03-03: 2 g via INTRAVENOUS

## 2020-03-03 MED ORDER — CHLORHEXIDINE GLUCONATE CLOTH 2 % EX PADS
6.0000 | MEDICATED_PAD | Freq: Every day | CUTANEOUS | Status: DC
  Administered 2020-03-03 – 2020-03-08 (×5): 6 via TOPICAL

## 2020-03-03 MED ORDER — LIDOCAINE 2% (20 MG/ML) 5 ML SYRINGE
INTRAMUSCULAR | Status: DC | PRN
  Administered 2020-03-03: 60 mg via INTRAVENOUS

## 2020-03-03 MED ORDER — ALBUMIN HUMAN 5 % IV SOLN: INTRAVENOUS | Status: DC | PRN

## 2020-03-03 MED ORDER — ROCURONIUM BROMIDE 10 MG/ML (PF) SYRINGE
PREFILLED_SYRINGE | INTRAVENOUS | Status: DC | PRN
  Administered 2020-03-03: 70 mg via INTRAVENOUS

## 2020-03-03 MED ORDER — ONDANSETRON HCL 4 MG/2ML IJ SOLN
INTRAMUSCULAR | Status: DC | PRN
  Administered 2020-03-03: 4 mg via INTRAVENOUS

## 2020-03-03 MED ORDER — METHOCARBAMOL 500 MG PO TABS: 500.0000 mg | ORAL_TABLET | Freq: Four times a day (QID) | ORAL | Status: DC | PRN

## 2020-03-03 MED ORDER — PROPOFOL 10 MG/ML IV BOLUS
INTRAVENOUS | Status: DC | PRN
  Administered 2020-03-03: 100 mg via INTRAVENOUS

## 2020-03-03 MED ORDER — EPHEDRINE SULFATE-NACL 50-0.9 MG/10ML-% IV SOSY
PREFILLED_SYRINGE | INTRAVENOUS | Status: DC | PRN
  Administered 2020-03-03: 10 mg via INTRAVENOUS

## 2020-03-03 MED ORDER — ACETAMINOPHEN 500 MG PO TABS
1000.0000 mg | ORAL_TABLET | Freq: Three times a day (TID) | ORAL | Status: DC
  Administered 2020-03-03 – 2020-03-04 (×2): 1000 mg via ORAL
  Filled 2020-03-03 (×2): qty 2

## 2020-03-03 MED ORDER — BACITRACIN ZINC 500 UNIT/GM EX OINT
TOPICAL_OINTMENT | Freq: Two times a day (BID) | CUTANEOUS | Status: DC
  Administered 2020-03-03 – 2020-03-06 (×3): 1 via TOPICAL
  Filled 2020-03-03 (×2): qty 28.4

## 2020-03-03 MED ORDER — ORAL CARE MOUTH RINSE: 15.0000 mL | Freq: Once | OROMUCOSAL | Status: DC

## 2020-03-03 MED ORDER — LACTATED RINGERS IV SOLN: INTRAVENOUS | Status: DC

## 2020-03-03 MED ORDER — OXYCODONE HCL 5 MG PO TABS
5.0000 mg | ORAL_TABLET | ORAL | Status: DC | PRN
  Administered 2020-03-03: 5 mg via ORAL
  Filled 2020-03-03: qty 1

## 2020-03-03 MED ORDER — MORPHINE SULFATE (PF) 2 MG/ML IV SOLN
2.0000 mg | INTRAVENOUS | Status: DC | PRN
  Administered 2020-03-03 (×3): 4 mg via INTRAVENOUS
  Administered 2020-03-03: 2 mg via INTRAVENOUS
  Filled 2020-03-03 (×3): qty 2
  Filled 2020-03-03: qty 1

## 2020-03-03 MED ORDER — METOPROLOL TARTRATE 5 MG/5ML IV SOLN
INTRAVENOUS | Status: DC | PRN
  Administered 2020-03-03: 1 mg via INTRAVENOUS

## 2020-03-03 MED ORDER — PROPOFOL 10 MG/ML IV BOLUS
INTRAVENOUS | Status: AC
  Filled 2020-03-03: qty 20

## 2020-03-03 MED ORDER — 0.9 % SODIUM CHLORIDE (POUR BTL) OPTIME
TOPICAL | Status: DC | PRN
  Administered 2020-03-03: 1000 mL

## 2020-03-03 MED ORDER — FENTANYL CITRATE (PF) 250 MCG/5ML IJ SOLN
INTRAMUSCULAR | Status: DC | PRN
  Administered 2020-03-03 (×3): 50 ug via INTRAVENOUS
  Administered 2020-03-03: 100 ug via INTRAVENOUS

## 2020-03-03 MED ORDER — KETOROLAC TROMETHAMINE 15 MG/ML IJ SOLN
15.0000 mg | Freq: Four times a day (QID) | INTRAMUSCULAR | Status: DC
  Administered 2020-03-03 – 2020-03-04 (×3): 15 mg via INTRAVENOUS
  Filled 2020-03-03 (×3): qty 1

## 2020-03-03 MED ORDER — PHENYLEPHRINE 40 MCG/ML (10ML) SYRINGE FOR IV PUSH (FOR BLOOD PRESSURE SUPPORT)
PREFILLED_SYRINGE | INTRAVENOUS | Status: DC | PRN
  Administered 2020-03-03 (×2): 120 ug via INTRAVENOUS
  Administered 2020-03-03 (×2): 80 ug via INTRAVENOUS
  Administered 2020-03-03 (×3): 120 ug via INTRAVENOUS

## 2020-03-03 MED ORDER — SUGAMMADEX SODIUM 200 MG/2ML IV SOLN
INTRAVENOUS | Status: DC | PRN
  Administered 2020-03-03: 200 mg via INTRAVENOUS

## 2020-03-03 MED ORDER — MORPHINE SULFATE (PF) 2 MG/ML IV SOLN: 1.0000 mg | INTRAVENOUS | Status: DC | PRN

## 2020-03-03 MED ORDER — METOPROLOL TARTRATE 5 MG/5ML IV SOLN
INTRAVENOUS | Status: AC
  Filled 2020-03-03: qty 5

## 2020-03-03 MED ORDER — CHLORHEXIDINE GLUCONATE 0.12 % MT SOLN: 15.0000 mL | Freq: Once | OROMUCOSAL | Status: DC

## 2020-03-03 MED ORDER — CEFAZOLIN SODIUM-DEXTROSE 2-4 GM/100ML-% IV SOLN
2.0000 g | Freq: Three times a day (TID) | INTRAVENOUS | Status: AC
  Administered 2020-03-03 – 2020-03-04 (×3): 2 g via INTRAVENOUS
  Filled 2020-03-03 (×4): qty 100

## 2020-03-03 MED ORDER — ESMOLOL HCL 100 MG/10ML IV SOLN
INTRAVENOUS | Status: DC | PRN
  Administered 2020-03-03: 30 mg via INTRAVENOUS

## 2020-03-03 SURGICAL SUPPLY — 68 items
APL PRP STRL LF DISP 70% ISPRP (MISCELLANEOUS) ×1
BIT DRILL 10 HOLLOW (BIT) ×1 IMPLANT
BIT DRILL 3.8X270 (BIT) ×1 IMPLANT
BIT DRILL 6.5 CONICAL (BIT) ×1 IMPLANT
BIT DRILL SHORT 3.2MM (DRILL) IMPLANT
BNDG ELASTIC 3X5.8 VLCR STR LF (GAUZE/BANDAGES/DRESSINGS) ×1 IMPLANT
BNDG ELASTIC 4X5.8 VLCR STR LF (GAUZE/BANDAGES/DRESSINGS) ×1 IMPLANT
BRUSH SCRUB EZ PLAIN DRY (MISCELLANEOUS) ×4 IMPLANT
CHLORAPREP W/TINT 26 (MISCELLANEOUS) ×2 IMPLANT
COVER SURGICAL LIGHT HANDLE (MISCELLANEOUS) ×3 IMPLANT
COVER WAND RF STERILE (DRAPES) ×2 IMPLANT
DRAPE C-ARM 42X72 X-RAY (DRAPES) ×2 IMPLANT
DRAPE C-ARMOR (DRAPES) ×2 IMPLANT
DRAPE IMP U-DRAPE 54X76 (DRAPES) ×2 IMPLANT
DRAPE INCISE IOBAN 66X45 STRL (DRAPES) ×2 IMPLANT
DRAPE U-SHAPE 47X51 STRL (DRAPES) ×2 IMPLANT
DRESSING MEPILEX FLEX 4X4 (GAUZE/BANDAGES/DRESSINGS) IMPLANT
DRILL SHORT 3.2MM (DRILL) ×4
DRSG ADAPTIC 3X8 NADH LF (GAUZE/BANDAGES/DRESSINGS) ×2 IMPLANT
DRSG EMULSION OIL 3X3 NADH (GAUZE/BANDAGES/DRESSINGS) ×1 IMPLANT
DRSG MEPILEX BORDER 4X4 (GAUZE/BANDAGES/DRESSINGS) ×1 IMPLANT
DRSG MEPILEX FLEX 4X4 (GAUZE/BANDAGES/DRESSINGS) ×2
DRSG PAD ABDOMINAL 8X10 ST (GAUZE/BANDAGES/DRESSINGS) ×6 IMPLANT
ELECT REM PT RETURN 9FT ADLT (ELECTROSURGICAL)
ELECTRODE REM PT RTRN 9FT ADLT (ELECTROSURGICAL) IMPLANT
GAUZE SPONGE 4X4 12PLY STRL (GAUZE/BANDAGES/DRESSINGS) ×2 IMPLANT
GLOVE BIO SURGEON STRL SZ 6.5 (GLOVE) ×6 IMPLANT
GLOVE BIO SURGEON STRL SZ7.5 (GLOVE) ×8 IMPLANT
GLOVE BIOGEL PI IND STRL 6.5 (GLOVE) ×1 IMPLANT
GLOVE BIOGEL PI IND STRL 7.5 (GLOVE) ×1 IMPLANT
GLOVE BIOGEL PI INDICATOR 6.5 (GLOVE) ×1
GLOVE BIOGEL PI INDICATOR 7.5 (GLOVE) ×1
GOWN STRL REUS W/ TWL LRG LVL3 (GOWN DISPOSABLE) ×2 IMPLANT
GOWN STRL REUS W/ TWL XL LVL3 (GOWN DISPOSABLE) ×1 IMPLANT
GOWN STRL REUS W/TWL LRG LVL3 (GOWN DISPOSABLE) ×4
GOWN STRL REUS W/TWL XL LVL3 (GOWN DISPOSABLE) ×2
GUIDEROD SS 2.5MMX280MM (ROD) ×1 IMPLANT
KIT BASIN OR (CUSTOM PROCEDURE TRAY) ×2 IMPLANT
KIT TURNOVER KIT B (KITS) ×2 IMPLANT
MANIFOLD NEPTUNE II (INSTRUMENTS) ×2 IMPLANT
NAIL CANN HUM IM 7X255 (Nail) ×1 IMPLANT
NS IRRIG 1000ML POUR BTL (IV SOLUTION) ×2 IMPLANT
PACK TOTAL JOINT (CUSTOM PROCEDURE TRAY) ×2 IMPLANT
PAD ARMBOARD 7.5X6 YLW CONV (MISCELLANEOUS) ×4 IMPLANT
PAD CAST 3X4 CTTN HI CHSV (CAST SUPPLIES) IMPLANT
PAD CAST 4YDX4 CTTN HI CHSV (CAST SUPPLIES) IMPLANT
PADDING CAST COTTON 3X4 STRL (CAST SUPPLIES) ×2
PADDING CAST COTTON 4X4 STRL (CAST SUPPLIES) ×2
ROD REAMING 2.5 (INSTRUMENTS) ×1 IMPLANT
SCREW LOCK 4.5X38 TI FT (Screw) ×2 IMPLANT
SCREW LOCK 4X22 TI (Screw) ×1 IMPLANT
SCREW LOCK 4X26 TI (Screw) ×1 IMPLANT
SCREW LOCK THRD TI ML 4.5X34 (Screw) ×1 IMPLANT
STAPLER VISISTAT 35W (STAPLE) ×2 IMPLANT
SUCTION FRAZIER HANDLE 10FR (MISCELLANEOUS) ×1
SUCTION TUBE FRAZIER 10FR DISP (MISCELLANEOUS) ×1 IMPLANT
SUT FIBERWIRE 2-0 18 17.9 3/8 (SUTURE)
SUT MNCRL AB 3-0 PS2 18 (SUTURE) ×2 IMPLANT
SUT MON AB 2-0 CT1 36 (SUTURE) ×2 IMPLANT
SUT VIC AB 0 CT1 27 (SUTURE) ×6
SUT VIC AB 0 CT1 27XBRD ANBCTR (SUTURE) ×2 IMPLANT
SUT VIC AB 2-0 CT1 27 (SUTURE) ×8
SUT VIC AB 2-0 CT1 27XBRD (SUTURE) ×2 IMPLANT
SUT VIC AB 2-0 CT1 TAPERPNT 27 (SUTURE) IMPLANT
SUT VIC AB 2-0 CT3 27 (SUTURE) IMPLANT
SUTURE FIBERWR 2-0 18 17.9 3/8 (SUTURE) IMPLANT
WATER STERILE IRR 1000ML POUR (IV SOLUTION) ×2 IMPLANT
YANKAUER SUCT BULB TIP NO VENT (SUCTIONS) ×2 IMPLANT
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
## 2020-03-03 NOTE — Progress Notes (Signed)
Pt managed to get splint off rt arm. Was going to call orth tech to re-apply, but now OR ready for pt. Report given to [employer]
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
## 2020-03-03 NOTE — Progress Notes (Signed)
Received call from pt's husband Christyana Corwin at [phone]. He states that pt needs a psychiatric evaluation.

## 2020-03-03 NOTE — Progress Notes (Signed)
Orthopedic Tech Progress Note Patient Details:  Emily Rice [birth year] [id number] Reapplication of coap splint. Sling placed in order to help keep the splint on.  Ortho Devices Type of Ortho Device: Arm sling Ortho Device/Splint Location: Right Arm Ortho Device/Splint Interventions: Application   Post Interventions Patient Tolerated: Well   Emily Rice 03/03/2020, 12:36 AM
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
## 2020-03-03 NOTE — Anesthesia Procedure Notes (Signed)
Procedure Name: Intubation Date/Time: 03/03/2020 4:00 PM Performed by: Griffin Dakin, CRNA Pre-anesthesia Checklist: Patient identified, Emergency Drugs available, Suction available and Patient being monitored Patient Re-evaluated:Patient Re-evaluated prior to induction Oxygen Delivery Method: Circle system utilized Preoxygenation: Pre-oxygenation with 100% oxygen Induction Type: IV induction Ventilation: Mask ventilation without difficulty Laryngoscope Size: Glidescope and 3 Grade View: Grade I Tube type: Oral Tube size: 7.0 mm Number of attempts: 1 Airway Equipment and Method: Stylet and Oral airway Placement Confirmation: ETT inserted through vocal cords under direct vision,  positive ETCO2 and breath sounds checked- equal and bilateral Secured at: 24 cm Tube secured with: Tape Dental Injury: Teeth and Oropharynx as per pre-operative assessment
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
## 2020-03-03 NOTE — OR Nursing (Signed)
Pt is alert and oriented to baseline- aware of name and date.Reviewed admission and on going care with receiving RN. Pt is in NAD at this time and is ready to be transferred to floor. Will con't to monitor until pt is transferred. Pt on Monitor NSR

## 2020-03-03 NOTE — Progress Notes (Signed)
Per Dr Zada Finders, since pt is alert and yelling she does not need a f/u CT head today.

## 2020-03-03 NOTE — Interval H&P Note (Signed)
History and Physical Interval Note:  03/03/2020 3:29 PM  Emily Rice  has presented today for surgery, with the diagnosis of Right segmental humerus fracture.  The various methods of treatment have been discussed with the patient and family. After consideration of risks, benefits and other options for treatment, the patient has consented to  Procedure(s): INTRAMEDULLARY (IM) NAIL HUMERAL (Right) as a surgical intervention.  The patient's history has been reviewed, patient examined, no change in status, stable for surgery.  I have reviewed the patient's chart and labs.  Questions were answered to the patient's satisfaction.     Lennette Bihari P Cartina Brousseau

## 2020-03-03 NOTE — Progress Notes (Signed)
Pt taken to Pre Op area room 36. Placed on their monitor, staff notified. Pt's husband notified.

## 2020-03-03 NOTE — Progress Notes (Signed)
I received call from [facility] at [facility]. She stated that Dr Noemi Chapel wanted to speak with the trauma MD. I obtained permission from the pt's husband, Shayana Hornstein to speak to [facility] and/or Dr Noemi Chapel. I updated Kayla and she asked for our MD to please contact Dr Dorethea Clan at [phone]. Dr Barry Dienes gave me a number where she was currently available, and that number was given to Dr Dorethea Clan as well as a breif HPI of pt.

## 2020-03-03 NOTE — Progress Notes (Signed)
Pt yelling out "get me up, I need to use the bathroom", wiggling mitten, leads, sat probe off. Has not voided yet today.Pt for OR later today. Foley placed per order. Pt resting quietly now.

## 2020-03-03 NOTE — Anesthesia Preprocedure Evaluation (Addendum)
Anesthesia Evaluation  Patient identified by MRN, date of birth, ID band Patient awake    Reviewed: Allergy & Precautions, NPO status , Patient's Chart, lab work & pertinent test results  History of Anesthesia Complications Negative for: history of anesthetic complications  Airway Mallampati: III  TM Distance: >3 FB Neck ROM: Full    Dental   Pulmonary neg pulmonary ROS,    Pulmonary exam normal        Cardiovascular + DVT  + dysrhythmias Atrial Fibrillation  Rhythm:Irregular Rate:Tachycardia     Neuro/Psych  Headaches, Anxiety Depression    GI/Hepatic negative GI ROS, Neg liver ROS,   Endo/Other  negative endocrine ROS  Renal/GU negative Renal ROS  negative genitourinary   Musculoskeletal negative musculoskeletal ROS (+)   Abdominal   Peds  Hematology negative hematology ROS (+)   Anesthesia Other Findings   Reproductive/Obstetrics                             Anesthesia Physical Anesthesia Plan  ASA: III and emergent  Anesthesia Plan: General   Post-op Pain Management:    Induction: Intravenous  PONV Risk Score and Plan: 3 and Ondansetron, Dexamethasone, Treatment may vary due to age or medical condition and Midazolam  Airway Management Planned: Oral ETT  Additional Equipment: None  Intra-op Plan:   Post-operative Plan: Extubation in OR  Informed Consent: I have reviewed the patients History and Physical, chart, labs and discussed the procedure including the risks, benefits and alternatives for the proposed anesthesia with the patient or authorized representative who has indicated his/her understanding and acceptance.     Dental advisory given  Plan Discussed with:   Anesthesia Plan Comments:         Anesthesia Quick Evaluation

## 2020-03-03 NOTE — Progress Notes (Signed)
Pt returned from OR. Restraint have been removed since she left, and she currently does not need them . She is drowsy, but rousable, oriented to self and F/C. See flowsheet for VS.

## 2020-03-03 NOTE — Transfer of Care (Signed)
Immediate Anesthesia Transfer of Care Note  Patient: Landa Mullinax  Procedure(s) Performed: INTRAMEDULLARY (IM) NAIL HUMERAL (Right Arm Upper)  Patient Location: PACU  Anesthesia Type:General  Level of Consciousness: awake and alert   Airway & Oxygen Therapy: Patient Spontanous Breathing and Patient connected to face mask oxygen  Post-op Assessment: Report given to RN and Post -op Vital signs reviewed and stable  Post vital signs: Reviewed and stable  Last Vitals:  Vitals Value Taken Time  BP    Temp    Pulse    Resp    SpO2      Last Pain:  Vitals:   03/03/20 1452  TempSrc:   PainSc: 0-No pain      Patients Stated Pain Goal: 3 (74/73/40 3709)  Complications: No complications documented.
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
## 2020-03-04 ENCOUNTER — Inpatient Hospital Stay (HOSPITAL_COMMUNITY): Payer: Medicare HMO

## 2020-03-04 LAB — CBC
HCT: 24.3 % — ABNORMAL LOW (ref 36.0–46.0)
Hemoglobin: 7.9 g/dL — ABNORMAL LOW (ref 12.0–15.0)
MCH: 27 pg (ref 26.0–34.0)
MCHC: 32.5 g/dL (ref 30.0–36.0)
MCV: 82.9 fL (ref 80.0–100.0)
Platelets: UNDETERMINED 10*3/uL (ref 150–400)
RBC: 2.93 MIL/uL — ABNORMAL LOW (ref 3.87–5.11)
RDW: 14.5 % (ref 11.5–15.5)
WBC: 10.1 10*3/uL (ref 4.0–10.5)
nRBC: 0 % (ref 0.0–0.2)

## 2020-03-04 LAB — ABO/RH: ABO/RH(D): A POS

## 2020-03-04 LAB — PREPARE RBC (CROSSMATCH)

## 2020-03-04 LAB — VITAMIN D 25 HYDROXY (VIT D DEFICIENCY, FRACTURES): Vit D, 25-Hydroxy: 30.38 ng/mL (ref 30–100)

## 2020-03-04 MED ORDER — METHOCARBAMOL 500 MG PO TABS: 750.0000 mg | ORAL_TABLET | Freq: Four times a day (QID) | ORAL | Status: DC

## 2020-03-04 MED ORDER — ERENUMAB-AOOE 70 MG/ML ~~LOC~~ SOAJ: 70.0000 mg | Freq: Every day | SUBCUTANEOUS | Status: DC | PRN

## 2020-03-04 MED ORDER — SODIUM CHLORIDE 0.9 % IV BOLUS
500.0000 mL | Freq: Once | INTRAVENOUS | Status: AC
  Administered 2020-03-04: 500 mL via INTRAVENOUS

## 2020-03-04 MED ORDER — BUPROPION HCL ER (XL) 150 MG PO TB24
300.0000 mg | ORAL_TABLET | Freq: Every day | ORAL | Status: DC
  Administered 2020-03-05 – 2020-03-08 (×4): 300 mg via ORAL
  Filled 2020-03-04: qty 1
  Filled 2020-03-04 (×5): qty 2

## 2020-03-04 MED ORDER — METOPROLOL TARTRATE 5 MG/5ML IV SOLN
5.0000 mg | Freq: Once | INTRAVENOUS | Status: AC
  Administered 2020-03-04: 5 mg via INTRAVENOUS
  Filled 2020-03-04: qty 5

## 2020-03-04 MED ORDER — VALBENAZINE TOSYLATE 40 MG PO CAPS: 40.0000 mg | ORAL_CAPSULE | Freq: Every day | ORAL | Status: DC

## 2020-03-04 MED ORDER — MORPHINE SULFATE (PF) 2 MG/ML IV SOLN: 2.0000 mg | INTRAVENOUS | Status: DC | PRN

## 2020-03-04 MED ORDER — ALPRAZOLAM 0.5 MG PO TABS
1.0000 mg | ORAL_TABLET | Freq: Three times a day (TID) | ORAL | Status: DC | PRN
  Administered 2020-03-05 – 2020-03-06 (×2): 1 mg via ORAL
  Filled 2020-03-04 (×2): qty 2

## 2020-03-04 MED ORDER — LEVETIRACETAM IN NACL 500 MG/100ML IV SOLN
500.0000 mg | Freq: Two times a day (BID) | INTRAVENOUS | Status: DC
  Administered 2020-03-04 – 2020-03-05 (×3): 500 mg via INTRAVENOUS
  Filled 2020-03-04 (×4): qty 100

## 2020-03-04 MED ORDER — CARBIDOPA-LEVODOPA 25-100 MG PO TABS
1.0000 | ORAL_TABLET | Freq: Three times a day (TID) | ORAL | Status: DC
  Administered 2020-03-04 – 2020-03-08 (×12): 1 via ORAL
  Filled 2020-03-04 (×13): qty 1

## 2020-03-04 MED ORDER — METOPROLOL SUCCINATE ER 25 MG PO TB24
12.5000 mg | ORAL_TABLET | Freq: Every day | ORAL | Status: DC
  Administered 2020-03-04 – 2020-03-06 (×3): 12.5 mg via ORAL
  Filled 2020-03-04 (×3): qty 1

## 2020-03-04 MED ORDER — ONDANSETRON 4 MG PO TBDP: 2.0000 mg | ORAL_TABLET | Freq: Four times a day (QID) | ORAL | Status: DC | PRN

## 2020-03-04 MED ORDER — AMANTADINE HCL 100 MG PO CAPS
100.0000 mg | ORAL_CAPSULE | Freq: Every day | ORAL | Status: DC
  Administered 2020-03-04 – 2020-03-07 (×4): 100 mg via ORAL
  Filled 2020-03-04 (×7): qty 1

## 2020-03-04 MED ORDER — ENSURE ENLIVE PO LIQD
237.0000 mL | Freq: Two times a day (BID) | ORAL | Status: DC
  Administered 2020-03-04 – 2020-03-08 (×6): 237 mL via ORAL

## 2020-03-04 MED ORDER — ACETAMINOPHEN 500 MG PO TABS
1000.0000 mg | ORAL_TABLET | Freq: Four times a day (QID) | ORAL | Status: DC
  Administered 2020-03-04 – 2020-03-08 (×11): 1000 mg via ORAL
  Filled 2020-03-04 (×14): qty 2

## 2020-03-04 MED ORDER — ENOXAPARIN SODIUM 30 MG/0.3ML ~~LOC~~ SOLN
30.0000 mg | Freq: Two times a day (BID) | SUBCUTANEOUS | Status: DC
  Administered 2020-03-05 – 2020-03-08 (×7): 30 mg via SUBCUTANEOUS
  Filled 2020-03-04 (×8): qty 0.3

## 2020-03-04 MED ORDER — LAMOTRIGINE 100 MG PO TABS
200.0000 mg | ORAL_TABLET | Freq: Two times a day (BID) | ORAL | Status: DC
  Administered 2020-03-04 – 2020-03-08 (×9): 200 mg via ORAL
  Filled 2020-03-04 (×9): qty 2

## 2020-03-04 MED ORDER — ADULT MULTIVITAMIN W/MINERALS CH
1.0000 | ORAL_TABLET | Freq: Every day | ORAL | Status: DC
  Administered 2020-03-04 – 2020-03-08 (×5): 1 via ORAL
  Filled 2020-03-04 (×5): qty 1

## 2020-03-04 MED ORDER — OXYCODONE HCL 5 MG PO TABS
5.0000 mg | ORAL_TABLET | ORAL | Status: DC | PRN
  Administered 2020-03-04 – 2020-03-08 (×6): 5 mg via ORAL
  Filled 2020-03-04 (×8): qty 1

## 2020-03-04 MED ORDER — QUETIAPINE FUMARATE 100 MG PO TABS
300.0000 mg | ORAL_TABLET | Freq: Every day | ORAL | Status: DC
  Administered 2020-03-04 – 2020-03-07 (×4): 300 mg via ORAL
  Filled 2020-03-04 (×4): qty 3

## 2020-03-04 MED ORDER — TRAZODONE HCL 50 MG PO TABS: 50.0000 mg | ORAL_TABLET | Freq: Every evening | ORAL | Status: DC | PRN

## 2020-03-04 MED ORDER — SUMATRIPTAN SUCCINATE 50 MG PO TABS
50.0000 mg | ORAL_TABLET | ORAL | Status: DC | PRN
  Filled 2020-03-04: qty 1

## 2020-03-04 NOTE — Progress Notes (Signed)
Pt transported to 5N, vitals stable. 1U PRBCs transfusing. No belongings at bedside. Husband notified of transfer.

## 2020-03-04 NOTE — Progress Notes (Signed)
Pt HR running 67 NSR
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
## 2020-03-04 NOTE — Progress Notes (Signed)
Patient heart rhythm converted into afib, previously running NSR in the 50s.  Called pt's husband who reports pt does have a history of an arhythmia and he believes it is afib.   MD on call notified of change in pts HR
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
## 2020-03-04 NOTE — Progress Notes (Signed)
Patient Heart rhythm converted into afib HR in 130s-140s  Notified MD trama oncall [place] and received verbal order for 5mg  of IV lopressor.   Will continue to monitor
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
## 2020-03-04 NOTE — Progress Notes (Signed)
Rehab Admissions Coordinator Note:  Patient was screened by Cleatrice Burke for appropriateness for an Inpatient Acute Rehab Consult per therapy recs. .  At this time, we are recommending Inpatient Rehab consult. Please place order if you would like her considered for admit. Please advise.  Cleatrice Burke RN MSN 03/04/2020, 12:27 PM  I can be reached at [phone].

## 2020-03-04 NOTE — Progress Notes (Signed)
Orthopedic Tech Progress Note Patient Details:  Emily Rice 08/16/1960 [id number] 4N RN called requesting an Acie Fredrickson for patient. Dropped off to 5N patient was comfortable at the time told the sitter what I was doing and what the sling was for, Ortho Devices Type of Ortho Device: Arm sling Ortho Device/Splint Location: RUE Ortho Device/Splint Interventions: Other (comment)   Post Interventions Patient Tolerated: Well Instructions Provided: Care of device   Janit Pagan 03/04/2020, 1:02 PM
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
## 2020-03-05 DIAGNOSIS — R251 Tremor, unspecified: Secondary | ICD-10-CM | POA: Insufficient documentation

## 2020-03-05 LAB — CBC
HCT: 24.4 % — ABNORMAL LOW (ref 36.0–46.0)
HCT: 27.9 % — ABNORMAL LOW (ref 36.0–46.0)
Hemoglobin: 8.1 g/dL — ABNORMAL LOW (ref 12.0–15.0)
Hemoglobin: 9.2 g/dL — ABNORMAL LOW (ref 12.0–15.0)
MCH: 27.5 pg (ref 26.0–34.0)
MCH: 28.2 pg (ref 26.0–34.0)
MCHC: 33.2 g/dL (ref 30.0–36.0)
MCHC: 33 g/dL (ref 30.0–36.0)
MCV: 82.7 fL (ref 80.0–100.0)
MCV: 85.6 fL (ref 80.0–100.0)
Platelets: UNDETERMINED 10*3/uL (ref 150–400)
Platelets: 102 10*3/uL — ABNORMAL LOW (ref 150–400)
RBC: 2.95 MIL/uL — ABNORMAL LOW (ref 3.87–5.11)
RBC: 3.26 MIL/uL — ABNORMAL LOW (ref 3.87–5.11)
RDW: 14.6 % (ref 11.5–15.5)
RDW: 14.8 % (ref 11.5–15.5)
WBC: 6.7 10*3/uL (ref 4.0–10.5)
WBC: 6.4 10*3/uL (ref 4.0–10.5)
nRBC: 0 % (ref 0.0–0.2)
nRBC: 0 % (ref 0.0–0.2)

## 2020-03-05 LAB — TYPE AND SCREEN
ABO/RH(D): A POS
Antibody Screen: NEGATIVE
Unit division: 0

## 2020-03-05 LAB — BPAM RBC
Blood Product Expiration Date: 202201162359
ISSUE DATE / TIME: 202112241059
Unit Type and Rh: 6200

## 2020-03-05 LAB — PHOSPHORUS: Phosphorus: 2.6 mg/dL (ref 2.5–4.6)

## 2020-03-05 LAB — MAGNESIUM: Magnesium: 2.1 mg/dL (ref 1.7–2.4)

## 2020-03-05 MED ORDER — LEVETIRACETAM 500 MG PO TABS
500.0000 mg | ORAL_TABLET | Freq: Two times a day (BID) | ORAL | Status: DC
  Administered 2020-03-05 – 2020-03-08 (×6): 500 mg via ORAL
  Filled 2020-03-05 (×6): qty 1

## 2020-03-05 MED ORDER — SODIUM CHLORIDE 0.9 % IV BOLUS
500.0000 mL | Freq: Once | INTRAVENOUS | Status: AC
  Administered 2020-03-05: 500 mL via INTRAVENOUS

## 2020-03-05 MED ORDER — LORAZEPAM 2 MG/ML IJ SOLN
1.0000 mg | Freq: Once | INTRAMUSCULAR | Status: AC
  Administered 2020-03-05: 1 mg via INTRAVENOUS
  Filled 2020-03-05: qty 1

## 2020-03-05 MED ORDER — POLYETHYLENE GLYCOL 3350 17 G PO PACK
17.0000 g | PACK | Freq: Every day | ORAL | Status: DC
  Administered 2020-03-05 – 2020-03-08 (×3): 17 g via ORAL
  Filled 2020-03-05 (×3): qty 1

## 2020-03-05 MED ORDER — DOCUSATE SODIUM 100 MG PO CAPS
100.0000 mg | ORAL_CAPSULE | Freq: Two times a day (BID) | ORAL | Status: DC
  Administered 2020-03-05 – 2020-03-08 (×6): 100 mg via ORAL
  Filled 2020-03-05 (×6): qty 1
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
## 2020-03-05 NOTE — Progress Notes (Signed)
Orthopedic Tech Progress Note Patient Details:  Emily Rice 11/09/1960 [id number] Called in order for wrist splint to [facility]. Patient ID: Nike Southers, female   DOB: 13-Apr-1960, 59 y.o.   MRN: [id number]   Chip Boer 03/05/2020, 11:57 AM

## 2020-03-05 NOTE — Plan of Care (Signed)
  Problem: Education: Goal: Knowledge of General Education information will improve Description: Including pain rating scale, medication(s)/side effects and non-pharmacologic comfort measures Outcome: Progressing   Problem: Health Behavior/Discharge Planning: Goal: Ability to manage health-related needs will improve Outcome: Progressing   Problem: Safety: Goal: Ability to remain free from injury will improve Outcome: Progressing   
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
## 2020-03-06 LAB — CBC
HCT: 26.5 % — ABNORMAL LOW (ref 36.0–46.0)
Hemoglobin: 8.8 g/dL — ABNORMAL LOW (ref 12.0–15.0)
MCH: 27.4 pg (ref 26.0–34.0)
MCHC: 33.2 g/dL (ref 30.0–36.0)
MCV: 82.6 fL (ref 80.0–100.0)
Platelets: 125 10*3/uL — ABNORMAL LOW (ref 150–400)
RBC: 3.21 MIL/uL — ABNORMAL LOW (ref 3.87–5.11)
RDW: 14.6 % (ref 11.5–15.5)
WBC: 5.4 10*3/uL (ref 4.0–10.5)
nRBC: 1.1 % — ABNORMAL HIGH (ref 0.0–0.2)

## 2020-03-06 LAB — BASIC METABOLIC PANEL
Anion gap: 9 (ref 5–15)
BUN: 10 mg/dL (ref 6–20)
CO2: 26 mmol/L (ref 22–32)
Calcium: 8.5 mg/dL — ABNORMAL LOW (ref 8.9–10.3)
Chloride: 107 mmol/L (ref 98–111)
Creatinine, Ser: 0.81 mg/dL (ref 0.44–1.00)
GFR, Estimated: >60 mL/min (ref >60)
Glucose, Bld: 98 mg/dL (ref 70–99)
Potassium: 3.6 mmol/L (ref 3.5–5.1)
Sodium: 142 mmol/L (ref 135–145)

## 2020-03-06 MED ORDER — METOPROLOL TARTRATE 5 MG/5ML IV SOLN
5.0000 mg | Freq: Once | INTRAVENOUS | Status: AC
  Administered 2020-03-06: 5 mg via INTRAVENOUS
  Filled 2020-03-06: qty 5

## 2020-03-06 MED ORDER — METOPROLOL TARTRATE 25 MG PO TABS
25.0000 mg | ORAL_TABLET | Freq: Three times a day (TID) | ORAL | Status: DC
  Administered 2020-03-06 – 2020-03-08 (×5): 25 mg via ORAL
  Filled 2020-03-06 (×6): qty 1

## 2020-03-06 MED ORDER — LORAZEPAM 2 MG/ML IJ SOLN
1.0000 mg | INTRAMUSCULAR | Status: DC | PRN
  Administered 2020-03-06: 2 mg via INTRAVENOUS

## 2020-03-06 MED ORDER — LORAZEPAM 2 MG/ML IJ SOLN
INTRAMUSCULAR | Status: AC
  Filled 2020-03-06: qty 1

## 2020-03-06 MED ORDER — LORAZEPAM 2 MG/ML IJ SOLN
1.0000 mg | Freq: Once | INTRAMUSCULAR | Status: AC
  Administered 2020-03-06: 1 mg via INTRAVENOUS
  Filled 2020-03-06: qty 1

## 2020-03-06 NOTE — Progress Notes (Addendum)
12/26 Pt agitated and confused. Rodman Key Tsuei,MD notified. Orders given. RN will continue to monitor patient.  12/26 0220 Pt is calm and sleeping. RN will continue to monitor pt.
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
## 2020-03-06 NOTE — Plan of Care (Signed)
  Problem: Education: Goal: Knowledge of General Education information will improve Description: Including pain rating scale, medication(s)/side effects and non-pharmacologic comfort measures Outcome: Progressing   Problem: Clinical Measurements: Goal: Will remain free from infection Outcome: Progressing Goal: Diagnostic test results will improve Outcome: Progressing   Problem: Elimination: Goal: Will not experience complications related to bowel motility Outcome: Progressing
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
## 2020-03-06 NOTE — Progress Notes (Signed)
Subjective: NAEs o/n  Objective: Vital signs in last 24 hours: Temp:  [98.4 F (36.9 C)-98.9 F (37.2 C)] 98.9 F (37.2 C) (12/26 0913) Pulse Rate:  [61-80] 80 (12/26 0913) Resp:  [15-18] 18 (12/26 0913) BP: (101-150)/(66-90) 119/72 (12/26 1050) SpO2:  [98 %-100 %] 100 % (12/26 0913)  Intake/Output from previous day: 12/25 0701 - 12/26 0700 In: 60 [P.O.:60] Out: 1750 [Urine:1750] Intake/Output this shift: No intake/output data recorded.  Alert, Oriented FC x 4  Lab Results: Recent Labs    03/05/20 0149 03/05/20 0654  WBC 6.7 6.4  HGB 8.1* 9.2*  HCT 24.4* 27.9*  PLT PLATELET CLUMPS NOTED ON SMEAR, UNABLE TO ESTIMATE 102*   BMET No results for input(s): NA, K, CL, CO2, GLUCOSE, BUN, CREATININE, CALCIUM in the last 72 hours.  Studies/Results: No results found.  Assessment/Plan: Small tSAH - no neurosurgical intervention indicated  Vallarie Mare 03/06/2020, 11:59 AM
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
## 2020-03-07 DIAGNOSIS — F319 Bipolar disorder, unspecified: Secondary | ICD-10-CM | POA: Diagnosis present

## 2020-03-07 DIAGNOSIS — I480 Paroxysmal atrial fibrillation: Secondary | ICD-10-CM

## 2020-03-07 DIAGNOSIS — I609 Nontraumatic subarachnoid hemorrhage, unspecified: Secondary | ICD-10-CM

## 2020-03-07 LAB — CBC
HCT: 25.3 % — ABNORMAL LOW (ref 36.0–46.0)
Hemoglobin: 8.4 g/dL — ABNORMAL LOW (ref 12.0–15.0)
MCH: 27.3 pg (ref 26.0–34.0)
MCHC: 33.2 g/dL (ref 30.0–36.0)
MCV: 82.1 fL (ref 80.0–100.0)
Platelets: 137 10*3/uL — ABNORMAL LOW (ref 150–400)
RBC: 3.08 MIL/uL — ABNORMAL LOW (ref 3.87–5.11)
RDW: 14.6 % (ref 11.5–15.5)
WBC: 5.5 10*3/uL (ref 4.0–10.5)
nRBC: 1.6 % — ABNORMAL HIGH (ref 0.0–0.2)

## 2020-03-07 MED ORDER — ALPRAZOLAM 0.5 MG PO TABS
1.0000 mg | ORAL_TABLET | Freq: Three times a day (TID) | ORAL | Status: DC
  Administered 2020-03-07 – 2020-03-08 (×2): 1 mg via ORAL
  Filled 2020-03-07 (×2): qty 2

## 2020-03-07 NOTE — TOC CAGE-AID Note (Signed)
Transition of Care [facility]) - CAGE-AID Screening   Patient Details  Name: Emily Rice MRN: [id number] Date of Birth: March 25, 1960  [facility]) CM/SW Contact:    Glennon Mac, RN Phone Number: 03/07/2020, 4:28 PM   Clinical Narrative: Pt admits to occasional glass of wine; denies need for ETOH resources.    CAGE-AID Screening: Substance Abuse Screening unable to be completed due to: : Patient unable to participate  Have You Ever Felt You Ought to Cut Down on Your Drinking or Drug Use?: No Have People Annoyed You By Critizing Your Drinking Or Drug Use?: No Have You Felt Bad Or Guilty About Your Drinking Or Drug Use?: No Have You Ever Had a Drink or Used Drugs First Thing In The Morning to Steady Your Nerves or to Get Rid of a Hangover?: No CAGE-AID Score: 0  Substance Abuse Education Offered: No     Quintella Baton, RN, BSN  Trauma/Neuro ICU Case Manager [phone]

## 2020-03-07 NOTE — Progress Notes (Signed)
Inpatient Rehabilitation-Admissions Coordinator   CIR consult received. Met with pt bedside as follow up from PM&R consult. Please see formal consult for details. Briefly discussed recommended program as pt was confused and wanting to eat her food. Received permission to call her husband to confirm interest and DC support. Await callback. Will update once there is more information.   Raechel Ache, OTR/L  Rehab Admissions Coordinator  [phone] 03/07/2020 11:55 AM
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
## 2020-03-07 NOTE — Progress Notes (Signed)
12/26 2100 Pt stable. Pt calm and cooperative. RN observed to be comfortable. RN will continue monitor patient.  12/27 0600 Pt calm and cooperative. Pt slept through the night. Pt took all her scheduled medication. Pt complained of no pain. Pt observed to be comfortable.
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
## 2020-03-07 NOTE — Plan of Care (Signed)
  Problem: Clinical Measurements: Goal: Will remain free from infection Outcome: Progressing   Problem: Safety: Goal: Ability to remain free from injury will improve Outcome: Progressing   Problem: Skin Integrity: Goal: Risk for impaired skin integrity will decrease Outcome: Progressing   
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
## 2020-03-07 NOTE — Anesthesia Postprocedure Evaluation (Signed)
Anesthesia Post Note  Patient: Emily Rice  Procedure(s) Performed: INTRAMEDULLARY (IM) NAIL HUMERAL (Right Arm Upper)     Patient location during evaluation: PACU Level of consciousness: awake and alert Pain management: pain level controlled Vital Signs Assessment: post-procedure vital signs reviewed and stable Respiratory status: spontaneous breathing, nonlabored ventilation and respiratory function stable Cardiovascular status: blood pressure returned to baseline and stable Postop Assessment: no apparent nausea or vomiting Anesthetic complications: no   No complications documented.  Last Vitals:  Vitals:   03/07/20 0413 03/07/20 0838  BP: (!) 90/46 102/63  Pulse: 67 70  Resp: 18   Temp: 36.6 C 36.6 C  SpO2: (!) 80% 95%    Last Pain:  Vitals:   03/07/20 0838  TempSrc: Oral  PainSc:                  Lucretia Kern

## 2020-03-07 NOTE — Progress Notes (Signed)
Inpatient Rehabilitation-Admissions Coordinator   Confirmed DC support from pt's husband. He would like to pursue admit for his wife.   AC will begin insurance auth process for possible admit.   Cheri Rous, OTR/L  Rehab Admissions Coordinator  [phone] 03/07/2020 1:18 PM
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
## 2020-03-08 ENCOUNTER — Telehealth: Payer: Self-pay

## 2020-03-08 ENCOUNTER — Encounter (HOSPITAL_COMMUNITY): Payer: Self-pay

## 2020-03-08 ENCOUNTER — Encounter (HOSPITAL_COMMUNITY): Payer: Self-pay | Admitting: Emergency Medicine

## 2020-03-08 ENCOUNTER — Inpatient Hospital Stay (HOSPITAL_COMMUNITY): Payer: Medicare HMO

## 2020-03-08 ENCOUNTER — Inpatient Hospital Stay (HOSPITAL_COMMUNITY)
Admission: RE | Admit: 2020-03-08 | Discharge: 2020-04-05 | DRG: 945 | Disposition: A | Discharge status: Home / Self Care | Payer: Medicare HMO | Source: Intra-hospital | Attending: Physical Medicine & Rehabilitation | Admitting: Physical Medicine & Rehabilitation

## 2020-03-08 ENCOUNTER — Encounter (HOSPITAL_COMMUNITY): Payer: Self-pay | Admitting: Physical Medicine & Rehabilitation

## 2020-03-08 ENCOUNTER — Other Ambulatory Visit: Payer: Self-pay

## 2020-03-08 DIAGNOSIS — M25551 Pain in right hip: Secondary | ICD-10-CM | POA: Diagnosis not present

## 2020-03-08 DIAGNOSIS — S42301D Unspecified fracture of shaft of humerus, right arm, subsequent encounter for fracture with routine healing: Secondary | ICD-10-CM

## 2020-03-08 DIAGNOSIS — S81011A Laceration without foreign body, right knee, initial encounter: Secondary | ICD-10-CM | POA: Diagnosis present

## 2020-03-08 DIAGNOSIS — I82409 Acute embolism and thrombosis of unspecified deep veins of unspecified lower extremity: Secondary | ICD-10-CM | POA: Diagnosis present

## 2020-03-08 DIAGNOSIS — R7401 Elevation of levels of liver transaminase levels: Secondary | ICD-10-CM | POA: Diagnosis not present

## 2020-03-08 DIAGNOSIS — G5631 Lesion of radial nerve, right upper limb: Secondary | ICD-10-CM | POA: Diagnosis present

## 2020-03-08 DIAGNOSIS — Z86711 Personal history of pulmonary embolism: Secondary | ICD-10-CM | POA: Diagnosis not present

## 2020-03-08 DIAGNOSIS — Z833 Family history of diabetes mellitus: Secondary | ICD-10-CM

## 2020-03-08 DIAGNOSIS — K5900 Constipation, unspecified: Secondary | ICD-10-CM | POA: Diagnosis present

## 2020-03-08 DIAGNOSIS — R251 Tremor, unspecified: Secondary | ICD-10-CM | POA: Diagnosis present

## 2020-03-08 DIAGNOSIS — R52 Pain, unspecified: Secondary | ICD-10-CM

## 2020-03-08 DIAGNOSIS — Z8249 Family history of ischemic heart disease and other diseases of the circulatory system: Secondary | ICD-10-CM | POA: Diagnosis not present

## 2020-03-08 DIAGNOSIS — S069XAA Unspecified intracranial injury with loss of consciousness status unknown, initial encounter: Secondary | ICD-10-CM | POA: Diagnosis present

## 2020-03-08 DIAGNOSIS — R4182 Altered mental status, unspecified: Secondary | ICD-10-CM | POA: Diagnosis present

## 2020-03-08 DIAGNOSIS — I609 Nontraumatic subarachnoid hemorrhage, unspecified: Secondary | ICD-10-CM | POA: Diagnosis not present

## 2020-03-08 DIAGNOSIS — M79609 Pain in unspecified limb: Secondary | ICD-10-CM | POA: Diagnosis not present

## 2020-03-08 DIAGNOSIS — S42301A Unspecified fracture of shaft of humerus, right arm, initial encounter for closed fracture: Secondary | ICD-10-CM | POA: Diagnosis present

## 2020-03-08 DIAGNOSIS — F3181 Bipolar II disorder: Secondary | ICD-10-CM | POA: Diagnosis not present

## 2020-03-08 DIAGNOSIS — I480 Paroxysmal atrial fibrillation: Secondary | ICD-10-CM | POA: Diagnosis present

## 2020-03-08 DIAGNOSIS — D62 Acute posthemorrhagic anemia: Secondary | ICD-10-CM | POA: Diagnosis present

## 2020-03-08 DIAGNOSIS — I959 Hypotension, unspecified: Secondary | ICD-10-CM | POA: Diagnosis not present

## 2020-03-08 DIAGNOSIS — S022XXD Fracture of nasal bones, subsequent encounter for fracture with routine healing: Secondary | ICD-10-CM

## 2020-03-08 DIAGNOSIS — M791 Myalgia, unspecified site: Secondary | ICD-10-CM

## 2020-03-08 DIAGNOSIS — R001 Bradycardia, unspecified: Secondary | ICD-10-CM | POA: Diagnosis not present

## 2020-03-08 DIAGNOSIS — Z79899 Other long term (current) drug therapy: Secondary | ICD-10-CM

## 2020-03-08 DIAGNOSIS — G43909 Migraine, unspecified, not intractable, without status migrainosus: Secondary | ICD-10-CM | POA: Diagnosis present

## 2020-03-08 DIAGNOSIS — F418 Other specified anxiety disorders: Secondary | ICD-10-CM | POA: Diagnosis present

## 2020-03-08 DIAGNOSIS — S42294S Other nondisplaced fracture of upper end of right humerus, sequela: Secondary | ICD-10-CM | POA: Diagnosis not present

## 2020-03-08 DIAGNOSIS — S069X9A Unspecified intracranial injury with loss of consciousness of unspecified duration, initial encounter: Secondary | ICD-10-CM | POA: Diagnosis present

## 2020-03-08 DIAGNOSIS — M545 Low back pain, unspecified: Secondary | ICD-10-CM | POA: Diagnosis present

## 2020-03-08 DIAGNOSIS — S42201D Unspecified fracture of upper end of right humerus, subsequent encounter for fracture with routine healing: Secondary | ICD-10-CM | POA: Diagnosis not present

## 2020-03-08 DIAGNOSIS — M79605 Pain in left leg: Secondary | ICD-10-CM | POA: Diagnosis not present

## 2020-03-08 DIAGNOSIS — S069X0D Unspecified intracranial injury without loss of consciousness, subsequent encounter: Secondary | ICD-10-CM | POA: Diagnosis not present

## 2020-03-08 DIAGNOSIS — S066X9D Traumatic subarachnoid hemorrhage with loss of consciousness of unspecified duration, subsequent encounter: Principal | ICD-10-CM

## 2020-03-08 DIAGNOSIS — M7989 Other specified soft tissue disorders: Secondary | ICD-10-CM | POA: Diagnosis not present

## 2020-03-08 DIAGNOSIS — Z823 Family history of stroke: Secondary | ICD-10-CM

## 2020-03-08 DIAGNOSIS — G43709 Chronic migraine without aura, not intractable, without status migrainosus: Secondary | ICD-10-CM | POA: Diagnosis present

## 2020-03-08 DIAGNOSIS — S066X1A Traumatic subarachnoid hemorrhage with loss of consciousness of 30 minutes or less, initial encounter: Secondary | ICD-10-CM | POA: Diagnosis not present

## 2020-03-08 DIAGNOSIS — R4189 Other symptoms and signs involving cognitive functions and awareness: Secondary | ICD-10-CM | POA: Diagnosis present

## 2020-03-08 DIAGNOSIS — T148XXA Other injury of unspecified body region, initial encounter: Secondary | ICD-10-CM

## 2020-03-08 DIAGNOSIS — Z7901 Long term (current) use of anticoagulants: Secondary | ICD-10-CM

## 2020-03-08 DIAGNOSIS — S42334S Nondisplaced oblique fracture of shaft of humerus, right arm, sequela: Secondary | ICD-10-CM | POA: Diagnosis not present

## 2020-03-08 DIAGNOSIS — S066X1S Traumatic subarachnoid hemorrhage with loss of consciousness of 30 minutes or less, sequela: Secondary | ICD-10-CM

## 2020-03-08 DIAGNOSIS — I4891 Unspecified atrial fibrillation: Secondary | ICD-10-CM

## 2020-03-08 DIAGNOSIS — R259 Unspecified abnormal involuntary movements: Secondary | ICD-10-CM | POA: Diagnosis present

## 2020-03-08 DIAGNOSIS — S42201A Unspecified fracture of upper end of right humerus, initial encounter for closed fracture: Secondary | ICD-10-CM | POA: Diagnosis present

## 2020-03-08 DIAGNOSIS — Z86718 Personal history of other venous thrombosis and embolism: Secondary | ICD-10-CM

## 2020-03-08 DIAGNOSIS — S81011D Laceration without foreign body, right knee, subsequent encounter: Secondary | ICD-10-CM

## 2020-03-08 DIAGNOSIS — F3160 Bipolar disorder, current episode mixed, unspecified: Secondary | ICD-10-CM | POA: Diagnosis present

## 2020-03-08 DIAGNOSIS — S069X0A Unspecified intracranial injury without loss of consciousness, initial encounter: Secondary | ICD-10-CM

## 2020-03-08 LAB — CREATININE, SERUM
Creatinine, Ser: 0.84 mg/dL (ref 0.44–1.00)
GFR, Estimated: >60 mL/min (ref >60)

## 2020-03-08 LAB — ECHOCARDIOGRAM COMPLETE
Area-P 1/2: 2.34 cm2
Height: 63 in
S' Lateral: 2.7 cm
Weight: 2544 oz

## 2020-03-08 LAB — CBC
HCT: 29.3 % — ABNORMAL LOW (ref 36.0–46.0)
Hemoglobin: 9.7 g/dL — ABNORMAL LOW (ref 12.0–15.0)
MCH: 27.9 pg (ref 26.0–34.0)
MCHC: 33.1 g/dL (ref 30.0–36.0)
MCV: 84.2 fL (ref 80.0–100.0)
Platelets: 169 10*3/uL (ref 150–400)
RBC: 3.48 MIL/uL — ABNORMAL LOW (ref 3.87–5.11)
RDW: 15.5 % (ref 11.5–15.5)
WBC: 7.6 10*3/uL (ref 4.0–10.5)
nRBC: 2.1 % — ABNORMAL HIGH (ref 0.0–0.2)

## 2020-03-08 MED ORDER — CARBIDOPA-LEVODOPA 25-100 MG PO TABS
1.0000 | ORAL_TABLET | Freq: Three times a day (TID) | ORAL | Status: DC
  Administered 2020-03-08 – 2020-04-05 (×84): 1 via ORAL
  Filled 2020-03-08 (×84): qty 1

## 2020-03-08 MED ORDER — ALPRAZOLAM 0.5 MG PO TABS
1.0000 mg | ORAL_TABLET | Freq: Three times a day (TID) | ORAL | Status: DC
  Administered 2020-03-08: 0.5 mg via ORAL
  Administered 2020-03-09 – 2020-04-05 (×81): 1 mg via ORAL
  Filled 2020-03-08 (×2): qty 4
  Filled 2020-03-08 (×2): qty 2
  Filled 2020-03-08 (×2): qty 4
  Filled 2020-03-08: qty 2
  Filled 2020-03-08 (×2): qty 4
  Filled 2020-03-08 (×2): qty 2
  Filled 2020-03-08 (×5): qty 4
  Filled 2020-03-08 (×2): qty 2
  Filled 2020-03-08 (×3): qty 4
  Filled 2020-03-08 (×4): qty 2
  Filled 2020-03-08 (×3): qty 4
  Filled 2020-03-08: qty 2
  Filled 2020-03-08: qty 4
  Filled 2020-03-08 (×3): qty 2
  Filled 2020-03-08 (×2): qty 4
  Filled 2020-03-08: qty 2
  Filled 2020-03-08: qty 4
  Filled 2020-03-08: qty 2
  Filled 2020-03-08 (×2): qty 4
  Filled 2020-03-08 (×3): qty 2
  Filled 2020-03-08 (×6): qty 4
  Filled 2020-03-08: qty 2
  Filled 2020-03-08 (×4): qty 4
  Filled 2020-03-08: qty 2
  Filled 2020-03-08 (×2): qty 4
  Filled 2020-03-08 (×5): qty 2
  Filled 2020-03-08 (×2): qty 4
  Filled 2020-03-08 (×2): qty 2
  Filled 2020-03-08: qty 4
  Filled 2020-03-08: qty 2
  Filled 2020-03-08 (×3): qty 4
  Filled 2020-03-08 (×2): qty 2
  Filled 2020-03-08: qty 4
  Filled 2020-03-08 (×2): qty 2
  Filled 2020-03-08: qty 4
  Filled 2020-03-08 (×6): qty 2

## 2020-03-08 MED ORDER — ONDANSETRON HCL 4 MG/2ML IJ SOLN: 4.0000 mg | Freq: Four times a day (QID) | INTRAMUSCULAR | Status: DC | PRN

## 2020-03-08 MED ORDER — SUMATRIPTAN SUCCINATE 50 MG PO TABS
50.0000 mg | ORAL_TABLET | ORAL | Status: DC | PRN
  Administered 2020-03-26 – 2020-03-28 (×2): 50 mg via ORAL
  Filled 2020-03-08 (×4): qty 1

## 2020-03-08 MED ORDER — BACITRACIN ZINC 500 UNIT/GM EX OINT
TOPICAL_OINTMENT | Freq: Two times a day (BID) | CUTANEOUS | Status: DC
  Administered 2020-03-09 – 2020-03-23 (×6): 1 via TOPICAL
  Filled 2020-03-08 (×5): qty 28.35

## 2020-03-08 MED ORDER — POLYETHYLENE GLYCOL 3350 17 G PO PACK: 17.0000 g | PACK | Freq: Two times a day (BID) | ORAL | Status: DC

## 2020-03-08 MED ORDER — POLYETHYLENE GLYCOL 3350 17 G PO PACK
17.0000 g | PACK | Freq: Two times a day (BID) | ORAL | Status: DC
  Administered 2020-03-08 – 2020-04-05 (×48): 17 g via ORAL
  Filled 2020-03-08 (×56): qty 1

## 2020-03-08 MED ORDER — OXYCODONE HCL 5 MG PO TABS
5.0000 mg | ORAL_TABLET | ORAL | Status: DC | PRN
  Administered 2020-03-09: 5 mg via ORAL
  Administered 2020-03-09 – 2020-03-10 (×3): 10 mg via ORAL
  Administered 2020-03-10: 5 mg via ORAL
  Administered 2020-03-10 – 2020-03-13 (×10): 10 mg via ORAL
  Administered 2020-03-14: 5 mg via ORAL
  Administered 2020-03-15 – 2020-03-17 (×6): 10 mg via ORAL
  Administered 2020-03-17 – 2020-03-18 (×2): 5 mg via ORAL
  Administered 2020-03-18 – 2020-03-24 (×11): 10 mg via ORAL
  Administered 2020-03-25: 5 mg via ORAL
  Administered 2020-03-25 – 2020-03-29 (×5): 10 mg via ORAL
  Filled 2020-03-08 (×26): qty 2
  Filled 2020-03-08: qty 1
  Filled 2020-03-08 (×7): qty 2
  Filled 2020-03-08: qty 1
  Filled 2020-03-08: qty 2
  Filled 2020-03-08: qty 1
  Filled 2020-03-08 (×5): qty 2

## 2020-03-08 MED ORDER — ENOXAPARIN SODIUM 30 MG/0.3ML ~~LOC~~ SOLN
30.0000 mg | Freq: Two times a day (BID) | SUBCUTANEOUS | Status: DC
  Administered 2020-03-08 – 2020-03-29 (×42): 30 mg via SUBCUTANEOUS
  Filled 2020-03-08 (×42): qty 0.3

## 2020-03-08 MED ORDER — LAMOTRIGINE 100 MG PO TABS
200.0000 mg | ORAL_TABLET | Freq: Two times a day (BID) | ORAL | Status: DC
  Administered 2020-03-08 – 2020-04-05 (×56): 200 mg via ORAL
  Filled 2020-03-08 (×57): qty 2

## 2020-03-08 MED ORDER — QUETIAPINE FUMARATE 50 MG PO TABS
300.0000 mg | ORAL_TABLET | Freq: Every day | ORAL | Status: DC
  Administered 2020-03-08 – 2020-03-28 (×21): 300 mg via ORAL
  Filled 2020-03-08 (×6): qty 2
  Filled 2020-03-08 (×3): qty 1
  Filled 2020-03-08 (×2): qty 2
  Filled 2020-03-08: qty 1
  Filled 2020-03-08 (×3): qty 2
  Filled 2020-03-08 (×3): qty 1
  Filled 2020-03-08 (×4): qty 2

## 2020-03-08 MED ORDER — ENSURE ENLIVE PO LIQD
237.0000 mL | Freq: Two times a day (BID) | ORAL | Status: DC
  Administered 2020-03-09 – 2020-03-15 (×11): 237 mL via ORAL
  Filled 2020-03-08: qty 237

## 2020-03-08 MED ORDER — AMANTADINE HCL 100 MG PO CAPS
100.0000 mg | ORAL_CAPSULE | Freq: Every day | ORAL | Status: DC
  Filled 2020-03-08: qty 1

## 2020-03-08 MED ORDER — METOPROLOL TARTRATE 25 MG PO TABS
25.0000 mg | ORAL_TABLET | Freq: Three times a day (TID) | ORAL | Status: DC
  Administered 2020-03-08 – 2020-03-09 (×4): 25 mg via ORAL
  Filled 2020-03-08 (×5): qty 1

## 2020-03-08 MED ORDER — TRAZODONE HCL 50 MG PO TABS
50.0000 mg | ORAL_TABLET | Freq: Every evening | ORAL | Status: DC | PRN
  Administered 2020-03-09 – 2020-04-04 (×9): 50 mg via ORAL
  Filled 2020-03-08 (×10): qty 1

## 2020-03-08 MED ORDER — ENOXAPARIN SODIUM 30 MG/0.3ML ~~LOC~~ SOLN
30.0000 mg | Freq: Two times a day (BID) | SUBCUTANEOUS | Status: DC
Start: 2020-03-08 — End: 2020-03-08

## 2020-03-08 MED ORDER — ACETAMINOPHEN 325 MG PO TABS: 325.0000 mg | ORAL_TABLET | ORAL | Status: DC | PRN

## 2020-03-08 MED ORDER — AMANTADINE HCL 100 MG PO CAPS
100.0000 mg | ORAL_CAPSULE | Freq: Every day | ORAL | Status: DC
  Administered 2020-03-08 – 2020-03-16 (×9): 100 mg via ORAL
  Filled 2020-03-08 (×9): qty 1

## 2020-03-08 MED ORDER — ONDANSETRON 4 MG PO TBDP: 4.0000 mg | ORAL_TABLET | Freq: Four times a day (QID) | ORAL | Status: DC | PRN

## 2020-03-08 MED ORDER — DOCUSATE SODIUM 100 MG PO CAPS
100.0000 mg | ORAL_CAPSULE | Freq: Two times a day (BID) | ORAL | Status: DC
  Administered 2020-03-08 – 2020-03-18 (×20): 100 mg via ORAL
  Filled 2020-03-08 (×20): qty 1

## 2020-03-08 MED ORDER — ADULT MULTIVITAMIN W/MINERALS CH
1.0000 | ORAL_TABLET | Freq: Every day | ORAL | Status: DC
  Administered 2020-03-09 – 2020-04-05 (×28): 1 via ORAL
  Filled 2020-03-08 (×28): qty 1
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
## 2020-03-08 NOTE — Plan of Care (Signed)
  Problem: Education: Goal: Knowledge of General Education information will improve Description: Including pain rating scale, medication(s)/side effects and non-pharmacologic comfort measures Outcome: Progressing   Problem: Health Behavior/Discharge Planning: Goal: Ability to manage health-related needs will improve Outcome: Progressing   Problem: Clinical Measurements: Goal: Will remain free from infection Outcome: Progressing   Problem: Nutrition: Goal: Adequate nutrition will be maintained Outcome: Progressing   Problem: Coping: Goal: Level of anxiety will decrease Outcome: Progressing   Problem: Elimination: Goal: Will not experience complications related to bowel motility Outcome: Progressing   Problem: Pain Managment: Goal: General experience of comfort will improve Outcome: Progressing   Problem: Safety: Goal: Ability to remain free from injury will improve Outcome: Progressing   
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
## 2020-03-08 NOTE — Progress Notes (Signed)
  Echocardiogram 2D Echocardiogram has been performed.  Emily Rice 03/08/2020, 1:59 PM

## 2020-03-08 NOTE — Progress Notes (Signed)
Pt arrived to 4W04 per bed with family at bedside. Plan of care, medications and histories reviewed. Oriented to rehab. No further questions. Vitals obtained and assessment complete.  Mylo Red, LPN
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
## 2020-03-08 NOTE — Plan of Care (Signed)
  Problem: Clinical Measurements: Goal: Will remain free from infection Outcome: Progressing   Problem: Health Behavior/Discharge Planning: Goal: Ability to manage health-related needs will improve Outcome: Not Progressing   Problem: Nutrition: Goal: Adequate nutrition will be maintained Outcome: Not Progressing

## 2020-03-08 NOTE — Progress Notes (Signed)
Pt seen in room during shift change. Pt alert/oriented in no apparent distress. Bilateral soft restraints discontinued.  No signs of pulling iv lines, Pt remains calm/cooperative at this time.
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
## 2020-03-08 NOTE — Telephone Encounter (Signed)
Patient will need TOC appt with ST or JG for a fib in 6 weeks. She is still at [facility], anticipating discharge tomorrow.    I called patient for TOC, NA, No VMbox to leave a message. We can try again tomorrow.
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
## 2020-03-08 NOTE — Progress Notes (Signed)
Inpatient Rehabilitation Medication Review by a Pharmacist  A complete drug regimen review was completed for this patient to identify any potential clinically significant medication issues.  Clinically significant medication issues were identified:  yes   Type of Medication Issue Identified Description of Issue Urgent (address now) Non-Urgent (address on AM team rounds) Plan   Drug Interaction(s) (clinically significant)       Duplicate Therapy  At home, patient was prescribe alprazolam (chronic med, last reported PTA dose 12/1) and lorazepam (new in December, last reported PTA dose 12/22). Only alprazolam resumed here Non-urgent Do not prescribe more benzodiazepine at discharge as patient has 30 day supply of both at home   Allergy       No Medication Administration End Date       Incorrect Dose       Additional Drug Therapy Needed       Other  Held home [place], apixaba, bupropion last filled July for 90d supply), vortioxetine (last filled Oct for 30 day supply), Valbenazine (last filled Sept for 30 d supply), divalproex ER 500mg  daily, Vitamin D supplement Non-urgent Apixaban on hold per cardiolog, f/u recommendations  Consider restarting home medications for bipolar disorder as able or needed  Vitamin D level wnl, ok to hold      For non-urgent medication issues to be resolved on team rounds tomorrow morning      Time spent performing this drug regimen review (minutes):  15   , PharmD, Elsie, [facility] Clinical Pharmacist  Please check AMION for all [facility] phone numbers After 10:00 PM, call Main Pharmacy [phone]
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
## 2020-03-08 NOTE — Progress Notes (Signed)
Inpatient Rehabilitation-Admissions Coordinator   Received insurance approval for admit to CIR. Received medical clearance from attending service. Notified pt and her husband of bed offer and they accepted. Reviewed insurance benefits letter and consent forms with her husband. All questions answered. RN and [facility] team updated on plan for today.   Cheri Rous, OTR/L  Rehab Admissions Coordinator  [phone] 03/08/2020 11:46 AM

## 2020-03-08 NOTE — Care Management Important Message (Signed)
Important Message  Patient Details  Name: Emily Rice MRN: [id number] Date of Birth: 1960/07/04   Medicare Important Message Given:  Yes     Dorena Bodo 03/08/2020, 4:04 PM
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
## 2020-03-08 NOTE — Progress Notes (Signed)
Pt requested xanax. RN was getting ready to give xanax Pt stated, " I just want one xanax pill because I did not know I was getting seroquel." RN educated pt on xanax and administration. Pt still refused. RN gave 1 xanax pill and notified pharmacy. RN waste medication with charge nurse Shanda Bumps. RN will notify PA on call.

## 2020-03-08 NOTE — Progress Notes (Signed)
Pt transferred to 4W04 as ordered. Report was already given to Ashley,LPN at 4W. Pt remains alert/oriented in no acute distress.

## 2020-03-09 ENCOUNTER — Inpatient Hospital Stay (HOSPITAL_COMMUNITY): Payer: Medicare HMO

## 2020-03-09 ENCOUNTER — Encounter (HOSPITAL_COMMUNITY): Payer: Self-pay | Admitting: Student

## 2020-03-09 ENCOUNTER — Inpatient Hospital Stay (HOSPITAL_COMMUNITY): Payer: Medicare HMO | Admitting: Speech Pathology

## 2020-03-09 LAB — CBC WITH DIFFERENTIAL/PLATELET
Abs Immature Granulocytes: 0.47 10*3/uL — ABNORMAL HIGH (ref 0.00–0.07)
Basophils Absolute: 0 10*3/uL (ref 0.0–0.1)
Basophils Relative: 1 %
Eosinophils Absolute: 0.4 10*3/uL (ref 0.0–0.5)
Eosinophils Relative: 4 %
HCT: 31.2 % — ABNORMAL LOW (ref 36.0–46.0)
Hemoglobin: 10.2 g/dL — ABNORMAL LOW (ref 12.0–15.0)
Immature Granulocytes: 5 %
Lymphocytes Relative: 24 %
Lymphs Abs: 2.1 10*3/uL (ref 0.7–4.0)
MCH: 27.4 pg (ref 26.0–34.0)
MCHC: 32.7 g/dL (ref 30.0–36.0)
MCV: 83.9 fL (ref 80.0–100.0)
Monocytes Absolute: 0.8 10*3/uL (ref 0.1–1.0)
Monocytes Relative: 9 %
Neutro Abs: 5.1 10*3/uL (ref 1.7–7.7)
Neutrophils Relative %: 57 %
Platelets: 178 10*3/uL (ref 150–400)
RBC: 3.72 MIL/uL — ABNORMAL LOW (ref 3.87–5.11)
RDW: 15.6 % — ABNORMAL HIGH (ref 11.5–15.5)
WBC: 8.8 10*3/uL (ref 4.0–10.5)
nRBC: 1.6 % — ABNORMAL HIGH (ref 0.0–0.2)

## 2020-03-09 LAB — COMPREHENSIVE METABOLIC PANEL
ALT: 15 U/L (ref 0–44)
AST: 99 U/L — ABNORMAL HIGH (ref 15–41)
Albumin: 3.1 g/dL — ABNORMAL LOW (ref 3.5–5.0)
Alkaline Phosphatase: 74 U/L (ref 38–126)
Anion gap: 10 (ref 5–15)
BUN: 8 mg/dL (ref 6–20)
CO2: 25 mmol/L (ref 22–32)
Calcium: 8.9 mg/dL (ref 8.9–10.3)
Chloride: 104 mmol/L (ref 98–111)
Creatinine, Ser: 0.79 mg/dL (ref 0.44–1.00)
GFR, Estimated: >60 mL/min (ref >60)
Glucose, Bld: 100 mg/dL — ABNORMAL HIGH (ref 70–99)
Potassium: 4.1 mmol/L (ref 3.5–5.1)
Sodium: 139 mmol/L (ref 135–145)
Total Bilirubin: 0.8 mg/dL (ref 0.3–1.2)
Total Protein: 6 g/dL — ABNORMAL LOW (ref 6.5–8.1)
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
## 2020-03-09 NOTE — Progress Notes (Signed)
Inpatient Rehabilitation  Patient information reviewed and entered into eRehab system by Cicero Noy M. Beaulah Romanek, M.A., CCC/SLP, PPS Coordinator.  Information including medical coding, functional ability and quality indicators will be reviewed and updated through discharge.    

## 2020-03-09 NOTE — Plan of Care (Signed)
  Problem: Health Behavior/Discharge Planning: Goal: Ability to manage health-related needs will improve Outcome: Progressing   Problem: Pain Managment: Goal: General experience of comfort will improve Outcome: Progressing   Problem: Safety: Goal: Ability to remain free from injury will improve Outcome: Progressing   Problem: Skin Integrity: Goal: Risk for impaired skin integrity will decrease Outcome: Progressing
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
## 2020-03-09 NOTE — Progress Notes (Signed)
Inpatient Rehabilitation Center Individual Statement of Services  Patient Name:  Emily Rice  Date:  03/09/2020  Welcome to the Inpatient Rehabilitation Center.  Our goal is to provide you with an individualized program based on your diagnosis and situation, designed to meet your specific needs.  With this comprehensive rehabilitation program, you will be expected to participate in at least 3 hours of rehabilitation therapies Monday-Friday, with modified therapy programming on the weekends.  Your rehabilitation program will include the following services:  Physical Therapy (PT), Occupational Therapy (OT), Speech Therapy (ST), 24 hour per day rehabilitation nursing, Neuropsychology, Care Coordinator, Rehabilitation Medicine, Nutrition Services and Pharmacy Services  Weekly team conferences will be held on Tuesday to discuss your progress.  Your Inpatient Rehabilitation Care Coordinator will talk with you frequently to get your input and to update you on team discussions.  Team conferences with you and your family in attendance may also be held.  Expected length of stay: 3-4 weeks  Overall anticipated outcome: supervision-CGA with cueing  Depending on your progress and recovery, your program may change. Your Inpatient Rehabilitation Care Coordinator will coordinate services and will keep you informed of any changes. Your Inpatient Rehabilitation Care Coordinator's name and contact numbers are listed  below.  The following services may also be recommended but are not provided by the Inpatient Rehabilitation Center:   Driving Evaluations  Home Health Rehabiltiation Services  Outpatient Rehabilitation Services    Arrangements will be made to provide these services after discharge if needed.  Arrangements include referral to agencies that provide these services.  Your insurance has been verified to be:  [employer] Your primary doctor is:    Pertinent information will be shared with  your doctor and your insurance company.  Inpatient Rehabilitation Care Coordinator:  Susie Cassette [phone] or ([phone]  Information discussed with and copy given to patient by: Lucy Chris, 03/09/2020, 2:49 PM
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
## 2020-03-10 ENCOUNTER — Inpatient Hospital Stay (HOSPITAL_COMMUNITY): Payer: Medicare HMO

## 2020-03-10 ENCOUNTER — Inpatient Hospital Stay (HOSPITAL_COMMUNITY): Payer: Medicare HMO | Admitting: Speech Pathology

## 2020-03-10 ENCOUNTER — Inpatient Hospital Stay (HOSPITAL_COMMUNITY): Payer: Medicare HMO | Admitting: Physical Therapy

## 2020-03-10 MED ORDER — METOPROLOL TARTRATE 25 MG PO TABS
25.0000 mg | ORAL_TABLET | Freq: Two times a day (BID) | ORAL | Status: DC
  Administered 2020-03-10 – 2020-03-11 (×2): 25 mg via ORAL
  Filled 2020-03-10 (×2): qty 1

## 2020-03-10 MED ORDER — GABAPENTIN 100 MG PO CAPS
100.0000 mg | ORAL_CAPSULE | Freq: Three times a day (TID) | ORAL | Status: DC
  Administered 2020-03-10 – 2020-03-11 (×3): 100 mg via ORAL
  Filled 2020-03-10 (×3): qty 1

## 2020-03-10 NOTE — Progress Notes (Signed)
Occupational Therapy Session Note  Patient Details  Name: Emily Rice MRN: [id number] Date of Birth: 12-Mar-1961  Today's Date: 03/10/2020 OT Individual Time: 1420-1515 OT Individual Time Calculation (min): 55 min    Short Term Goals: Week 1:  OT Short Term Goal 1 (Week 1): Pt will maintain arousal to task with no more than min A OT Short Term Goal 2 (Week 1): Pt will complete UB bathing with compensatory strategies with min A OT Short Term Goal 3 (Week 1): Pt will don pants with mod A  Skilled Therapeutic Interventions/Progress Updates:    1:1. Pt received in bed with husband present. Husband very supportive throughout session. Pt eventually agreeable to sit EOB with MAX A for mobility and S-MOD A for sitting balance. Pt initially describing room as "spinning" but this subsides with visual fixation. Pt completes LB dressing with MAX A +2 for clothing mangment at sit to stand level with +2 A only for Advancing pants past hips. Pt requires MAX A for UB dressing recalling threading RUE first. Pt completes 1 more stand and side steps to [facility] with MAX A for power up and MOD A for stepping R with R leg. OT positions pt on side and pt reporting feeling relief of pain (unrated) in bottom. Gentle PROM provided in small ranges of RUE and pt provided with stress bal to squeeze. Edu re positioning every 2 hours for wound healing and sign placed at [facility]. Exited session with pt seated in bed, exit alarm on and call light in reach   Therapy Documentation Precautions:  Precautions Precautions: Fall Required Braces or Orthoses: Sling Restrictions Weight Bearing Restrictions: Yes RUE Weight Bearing: Non weight bearing Other Position/Activity Restrictions: Sling with activity for comfort, no ROM restrictions General:   Vital Signs:  Pain: Pain Assessment Pain Scale: 0-10 Pain Score: 8  Pain Intervention(s): Medication (See eMAR);Repositioned ADL: ADL Eating: Set up Where Assessed-Eating: Bed  level Grooming: Unable to assess Upper Body Bathing: Unable to assess Lower Body Bathing: Unable to assess Upper Body Dressing: Unable to assess Lower Body Dressing: Maximal assistance Where Assessed-Lower Body Dressing: Edge of bed Toileting: Maximal assistance Where Assessed-Toileting: Bed level Toilet Transfer: Unable to assess Tub/Shower Transfer Method: Unable to assess Vision   Perception    Praxis   Exercises:   Other Treatments:     Therapy/Group: Individual Therapy  Shon Hale 03/10/2020, 12:28 PM

## 2020-03-10 NOTE — Plan of Care (Addendum)
Behavioral Plan   [place] Level: VII  Behavior to decrease/ eliminate:  Pain Lethargy Increase ability to participate effectively in treatment sessions  Changes to environment:    Interventions:  Schedule pain medicine  Sleep Wake Schedule  Recommendations for interactions with patient:  Speak softly   Attendees:   Casimiro Needle, PT Feliberto Gottron, SLP Blanch Media, OT Serina Cowper, PT 1/11

## 2020-03-10 NOTE — Progress Notes (Signed)
Physical Therapy Session Note  Patient Details  Name: Emily Rice MRN: [id number] Date of Birth: 1960-12-25  Today's Date: 03/10/2020 PT Individual Time: 0807-0904 PT Individual Time Calculation (min): 57 min   Short Term Goals: Week 1:  PT Short Term Goal 1 (Week 1): Patient will perform bed mobility with mod A consistently. PT Short Term Goal 2 (Week 1): Patient will perform basic transfer with mod A of 1 person. PT Short Term Goal 3 (Week 1): Patient will initiate gait training.  Skilled Therapeutic Interventions/Progress Updates:    Pt received asleep, supine in bed and initially upon awakening requests for therapist to return at a later time due to fatigue, but with education on therapy scheduling pt agreeable to session at this time stating "I really want to work with you." Assessed vitals in supine: BP 115/68 (MAP 83), HR 58bpm. Donned thigh high TED hose - during this, pt reports sensitivity along L posterior lower leg near Achille's tendon area. Reinforced education on R UE NWBing precaution prior to initiating mobility. Supine>sitting L EOB, HOB flat with bedrails available, with max assist primarily due to onset of significant bilateral LE pain located near ischial tuberosities causing strong posterior lean pain response - therapist used pad to assist with scooting hips towards EOB and repositioning for pain management. Reassessed vitals in sitting: BP 102/62 (MAP 76), HR 63bpm with pt reporting "lightheadedness"/"dizzy" and reassessed a few minutes later: BP 100/68 (MAP 79), HR 61bpm. Tolerated sitting EOB ~10 minutes during clothing management with supervision for sitting balance - threaded pants onto LEs max assist, donned shoes max assist, and donned clean shirt with max assist and cuing for sequencing due to R UE restrictions. Donned sling on R UE but pt reports it causing neck pain and declined wearing. Attempted sit>stand EOB>UE support on therapist 2x with max assist; however, pt  experiencing increased pain near ischial tuberosities and is unable to clear hips from EOB. Pt apologetic that she is unable to do more and requests to return to supine due to increased pain from prolonged sitting. Sit>supine max assist for R UE management and B LE management onto bed. MD in/out for morning assessment and made aware of pt's pain locations and vital signs. In supine, doffed pants and shoes max assist. Scoot towards [facility] mod assist with therapist facilitating LE positioning into hooklying to increase pt participation. Pt remained L semi-sidelying in bed with HOB elevated, snack in place (as pt did not eat breakfast), bed alarm on, and needs in reach.  Therapy Documentation Precautions:  Precautions Precautions: Fall Required Braces or Orthoses: Sling Restrictions Weight Bearing Restrictions: Yes RUE Weight Bearing: Non weight bearing Other Position/Activity Restrictions: Sling with activity for comfort, no ROM restrictions  Pain:   Details above but reports R UE pain, B LE ischial tuberosity pain, and L lower leg pain - MD made aware - RN notified for medication administration - therapist provided repositioning, rest, distraction, and emotional support for pain management.   Therapy/Group: Individual Therapy  Ginny Forth , PT, DPT, CSRS  03/10/2020, 7:44 AM
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
## 2020-03-10 NOTE — Progress Notes (Signed)
Speech Language Pathology Daily Session Note  Patient Details  Name: Emily Rice MRN: [id number] Date of Birth: December 02, 1960  Today's Date: 03/10/2020 SLP Individual Time: 1000-1030 SLP Individual Time Calculation (min): 30 min and Today's Date: 03/10/2020 SLP Missed Time: 30 Minutes Missed Time Reason: Pain  Short Term Goals: Week 1: SLP Short Term Goal 1 (Week 1): Patient will demonstrate orientation to place, time and situation with Min verbal cues. SLP Short Term Goal 2 (Week 1): Patient will demonstrate sustained attention to functional tasks for 15 minutes with Mod verbal cues SLP Short Term Goal 3 (Week 1): Patient will demonstrate functional problem solving for basic and familiar tasks with Mod verbal cues. SLP Short Term Goal 4 (Week 1): Patient will demonstrate intellectual awareness of deficits with Mod A verbal cues.  Skilled Therapeutic Interventions: Skilled treatment session focused on cognitive goals. Upon arrival, patient was extremely restless due to reports of extreme pain. Patient requested a fruit cup and SLP had to dependently feed the patient due to poor postioning and inability to sit upright. Patient could only consume ~4 bites before declining more due to restlessness and pain. RN aware but patent was premedicated. Patient reported, "I'm in distress," SLP provided emotional support and attempted to reposition patient to maximize comfort. Patient eventually appeared to settle down and appeared mildly comfortable, therefore, patient missed remaining 30 minutes of session for patient to rest. Patient left supine in bed with alarm on and all needs within reach. Continue with current plan of care.      Pain: Pain Assessment Pain Scale: 0-10 Pain Score: 8  Pain Intervention(s): pre-Medicated (See eMAR);Repositioned   Therapy/Group: Individual Therapy  Darletta Noblett 03/10/2020, 3:19 PM

## 2020-03-11 ENCOUNTER — Inpatient Hospital Stay (HOSPITAL_COMMUNITY): Payer: Medicare HMO

## 2020-03-11 ENCOUNTER — Inpatient Hospital Stay (HOSPITAL_COMMUNITY): Payer: Medicare HMO | Admitting: Physical Therapy

## 2020-03-11 ENCOUNTER — Inpatient Hospital Stay (HOSPITAL_COMMUNITY): Payer: Medicare HMO | Admitting: Speech Pathology

## 2020-03-11 DIAGNOSIS — M7989 Other specified soft tissue disorders: Secondary | ICD-10-CM | POA: Diagnosis not present

## 2020-03-11 DIAGNOSIS — M79609 Pain in unspecified limb: Secondary | ICD-10-CM

## 2020-03-11 DIAGNOSIS — M79605 Pain in left leg: Secondary | ICD-10-CM

## 2020-03-11 MED ORDER — OXYCODONE HCL 5 MG PO TABS
5.0000 mg | ORAL_TABLET | Freq: Every day | ORAL | Status: DC
  Administered 2020-03-11: 5 mg via ORAL

## 2020-03-11 MED ORDER — TRAMADOL HCL 50 MG PO TABS
50.0000 mg | ORAL_TABLET | Freq: Four times a day (QID) | ORAL | Status: DC | PRN
  Administered 2020-03-11 – 2020-03-27 (×4): 50 mg via ORAL
  Filled 2020-03-11 (×4): qty 1

## 2020-03-11 MED ORDER — METOPROLOL TARTRATE 12.5 MG HALF TABLET
12.5000 mg | ORAL_TABLET | Freq: Two times a day (BID) | ORAL | Status: DC
  Administered 2020-03-11 – 2020-03-16 (×7): 12.5 mg via ORAL
  Filled 2020-03-11 (×10): qty 1

## 2020-03-11 MED ORDER — GABAPENTIN 100 MG PO CAPS
100.0000 mg | ORAL_CAPSULE | Freq: Every day | ORAL | Status: DC
  Administered 2020-03-12 – 2020-03-23 (×12): 100 mg via ORAL
  Filled 2020-03-11 (×12): qty 1
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
## 2020-03-11 NOTE — Progress Notes (Signed)
Physical Therapy Session Note  Patient Details  Name: Emily Rice MRN: [id number] Date of Birth: [birth year]  Today's Date: 03/11/2020 PT Individual Time: 1102-1147 PT Individual Time Calculation (min): 45 min   and  Today's Date: 03/11/2020 PT Missed Time: 15 Minutes Missed Time Reason: Patient fatigue;Pain  Short Term Goals: Week 1:  PT Short Term Goal 1 (Week 1): Patient will perform bed mobility with mod A consistently. PT Short Term Goal 2 (Week 1): Patient will perform basic transfer with mod A of 1 person. PT Short Term Goal 3 (Week 1): Patient will initiate gait training.  Skilled Therapeutic Interventions/Progress Updates:    Pt received asleep, supine in bed with lights off but blinds open and upon awakening pt agreeable to therapy session though stating she is "not so good right now" speaking soft and mumbled - reports "my head hurts", premedicated. Pt stating she feels that she is having a full day of activities doing things with her husband but then once she tells her husband at the end of the night what all they did he corrects her - pt partially aware that this is not actually happening but doesn't understand why she is experiencing this - therapist educated her on TBI recovery. Pt remains lethargic during session keeping eyes closed. Assessed supine vitals: BP 105/63 (MAP 76), HR 56bpm with B LE thigh high TEDs already donned. Reiterated R UE NWBing precaution with pt requiring increased cuing to maintain this during mobility today. Initiated supine>sitting L EOB, HOB slightly elevated, but once moving LEs over to EOB pt reports onset of B LE pain near ischial tuberosities (appears to have more pain in L LE compared to R today). Therapist performed B LE PROM hip/knee flexion/extension knee to chest movements x12 reps each LE for pain management. Reinitiated supine>sitting L EOB as above with pt requiring 2 attempts due to continued pain though decreased from before with max  assist for trunk upright and for pivoting hips to EOB. Able to maintain sitting balance EOB with supervision and pt continuing to keep eyes closed although awake. Assessed sitting vitals: BP 85/62 (MAP 70), HR 61bpm with pt reporting feeling "dizzy" reassessed ~3 minutes after: BP 93/61 (MAP 71), HR 64bpm with pt still symptomatic but improving. After sitting EOB ~27minutes pt reports increasing pain on buttocks and is agreeable to attempt standing. Sit>stand elevated EOB>L UE support on bedrail with max assist for lifting then min/mod assist to maintain static standing balance ~20 seconds with therapist facilitating increased hip/knee extension for upright posture.  Pt requests to return to sitting and while lower during descent had significant increase pain around bilateral ischial tuberosities. Reassessed vitals after standing: BP 123/65 (MAP 78), HR 66bpm  Supine>sitting with max assist for R UE management and B LE management onto bed keeping LEs in flexed position to achieve hooklying for pain management. Pt reports feeling some nausea - states at baseline she would have nausea some days. Pt declines additional attempts of OOB mobility at this time. Supine scoot towards HOB with bed in trendelenburg using L UE support on bedrail and therapist facilitating use of B LEs in hooklying position. Attempted to reposition bed into chair position but resulted in too flexed of a posture therefore adjusted to still promote trunk upright with pt opening eyes and keeping them open with increasing alertness. Pt left in bed with needs in reach, snack and drink set-up with encouragement for increased fluid intake, and bed alarm on. Missed 15 minutes of skilled physical therapy.  Therapy Documentation Precautions:  Precautions Precautions: Fall Required Braces or Orthoses: Sling Restrictions Weight Bearing Restrictions: Yes RUE Weight Bearing: Non weight bearing Other Position/Activity Restrictions: Sling with  activity for comfort, no ROM restrictions  Pain:   Reports headache pain, R UE pain, and B LE pain near ischial tuberosities - premedicated, provided support for R UE, and ROM for B LE for pain management as well as rest breaks, emotional support, distraction, and repositioning.   Therapy/Group: Individual Therapy  Ginny Forth , PT, DPT, CSRS  03/11/2020, 8:00 AM
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
## 2020-03-11 NOTE — Progress Notes (Signed)
Speech Language Pathology Daily Session Note  Patient Details  Name: Emily Rice MRN: [id number] Date of Birth: 07/03/1960  Today's Date: 03/11/2020 SLP Individual Time: 1300-1355 SLP Individual Time Calculation (min): 55 min  Short Term Goals: Week 1: SLP Short Term Goal 1 (Week 1): Patient will demonstrate orientation to place, time and situation with Min verbal cues. SLP Short Term Goal 2 (Week 1): Patient will demonstrate sustained attention to functional tasks for 15 minutes with Mod verbal cues SLP Short Term Goal 3 (Week 1): Patient will demonstrate functional problem solving for basic and familiar tasks with Mod verbal cues. SLP Short Term Goal 4 (Week 1): Patient will demonstrate intellectual awareness of deficits with Mod A verbal cues.  Skilled Therapeutic Interventions: Skilled treatment session focused on cognitive goals. Upon arrival, patient appeared lethargic but in less pain compared to previous sessions. SLP administered the [facility] Mental Status Examination (SLUMS) and patient scored 10/24 points with a score of 21 or above considered normal. Patient demonstrated deficits in attention, immediate and short-term recall and problem solving. Patient's husband present throughout session and both the patient and her husband were provided education regarding brain injury recovery and patient's current cognitive functioning. Both verbalized understanding. Patient left upright in bed with alarm on and husband present. Continue with current plan of care.       Pain No reports of pain  Therapy/Group: Individual Therapy  Damani Rando 03/11/2020, 3:05 PM

## 2020-03-11 NOTE — Progress Notes (Signed)
Left lower extremity venous study completed.     Please see CV Proc for preliminary results.   Vonzell Schlatter, RVT

## 2020-03-11 NOTE — Progress Notes (Signed)
Occupational Therapy Session Note  Patient Details  Name: Emily Rice MRN: [id number] Date of Birth: January 19, 1961  Today's Date: 03/11/2020 OT Individual Time: 8469-6295 OT Individual Time Calculation (min): 60 min    Short Term Goals: Week 1:  OT Short Term Goal 1 (Week 1): Pt will maintain arousal to task with no more than min A OT Short Term Goal 2 (Week 1): Pt will complete UB bathing with compensatory strategies with min A OT Short Term Goal 3 (Week 1): Pt will don pants with mod A  Skilled Therapeutic Interventions/Progress Updates:    1:1. Pt received in bed with unrated but high pain in hips. MD present reports Xray is normal. Discussed scheduled meddicaiton for pain in the morning and MD places order. Pt requires significant time to arouse. Pt provided with RoHO cushion for pressure relief in w/c. teds and pants donned total A with partial bridge for clothing maangment. Upon sitting EOB with MAX A, pt requires MAX encouragement to particiapte in grooming at EOB. Pt reports dizziness and requires laying down rest break. Pt reporting need to toilet. 2 helpers for clothing management required and MOD-MAX A for stand pivot transfer to [facility]. Pt with no dizziness reported on BSC. Positioned pt in sidelying on L for comfort at end of session.Exited session with pt seated in bed, exit alarm on and call light in reach   Therapy Documentation Precautions:  Precautions Precautions: Fall Required Braces or Orthoses: Sling Restrictions Weight Bearing Restrictions: Yes RUE Weight Bearing: Non weight bearing Other Position/Activity Restrictions: Sling with activity for comfort, no ROM restrictions General:   Vital Signs: Therapy Vitals Temp: 98.2 F (36.8 C) Pulse Rate: 61 BP: (!) 86/71 Patient Position (if appropriate): Lying Oxygen Therapy SpO2: 100 % O2 Device: Room Air Pain:   ADL: ADL Eating: Set up Where Assessed-Eating: Bed level Grooming: Unable to assess Upper Body  Bathing: Unable to assess Lower Body Bathing: Unable to assess Upper Body Dressing: Unable to assess Lower Body Dressing: Maximal assistance Where Assessed-Lower Body Dressing: Edge of bed Toileting: Maximal assistance Where Assessed-Toileting: Bed level Toilet Transfer: Unable to assess Tub/Shower Transfer Method: Unable to assess Vision   Perception    Praxis   Exercises:   Other Treatments:     Therapy/Group: Individual Therapy  Shon Hale 03/11/2020, 6:48 AM

## 2020-03-12 ENCOUNTER — Inpatient Hospital Stay (HOSPITAL_COMMUNITY): Payer: Medicare HMO | Admitting: Occupational Therapy

## 2020-03-12 ENCOUNTER — Inpatient Hospital Stay (HOSPITAL_COMMUNITY): Payer: Medicare HMO | Admitting: Physical Therapy

## 2020-03-12 ENCOUNTER — Inpatient Hospital Stay (HOSPITAL_COMMUNITY): Payer: Medicare HMO

## 2020-03-12 DIAGNOSIS — S069X0D Unspecified intracranial injury without loss of consciousness, subsequent encounter: Secondary | ICD-10-CM

## 2020-03-12 DIAGNOSIS — R7401 Elevation of levels of liver transaminase levels: Secondary | ICD-10-CM

## 2020-03-12 DIAGNOSIS — I480 Paroxysmal atrial fibrillation: Secondary | ICD-10-CM

## 2020-03-12 DIAGNOSIS — S42201D Unspecified fracture of upper end of right humerus, subsequent encounter for fracture with routine healing: Secondary | ICD-10-CM

## 2020-03-12 MED ORDER — TOPIRAMATE 25 MG PO TABS
25.0000 mg | ORAL_TABLET | Freq: Every day | ORAL | Status: DC
Start: 2020-03-12 — End: 2020-03-26
  Administered 2020-03-12 – 2020-03-25 (×14): 25 mg via ORAL
  Filled 2020-03-12 (×15): qty 1

## 2020-03-12 MED ORDER — DICLOFENAC SODIUM 1 % EX GEL
2.0000 g | Freq: Four times a day (QID) | CUTANEOUS | Status: DC
  Administered 2020-03-12 – 2020-03-30 (×56): 2 g via TOPICAL
  Filled 2020-03-12: qty 100

## 2020-03-12 NOTE — Progress Notes (Signed)
Occupational Therapy Session Note  Patient Details  Name: Emily Rice MRN: [id number] Date of Birth: [birth year]  Today's Date: 03/12/2020 OT Individual Time: 0850-1000 OT Individual Time Calculation (min): 70 min    Short Term Goals: Week 1:  OT Short Term Goal 1 (Week 1): Pt will maintain arousal to task with no more than min A OT Short Term Goal 2 (Week 1): Pt will complete UB bathing with compensatory strategies with min A OT Short Term Goal 3 (Week 1): Pt will don pants with mod A  Skilled Therapeutic Interventions/Progress Updates:    Treatment session with focus on self-care retraining and functional mobility.  Pt received supine in bed reporting pain but agreeable to therapy session.  Pt expressing desire to get "cleaned up".  Pt had been incontinent of urine, therefore therapist gathered clean clothing and linen.  Upon return, pt stated "I know why you're here, I just can't make my body move".  Pt completed bed mobility with mod assist for rolling, pt able to clean perineal area but required assistance to wash buttocks.  Max assist to come to sitting at EOB and intermittent CGA to Supervision for sitting balance.  Pt engaged in UB bathing with encouragement.  Pt declined donning clothes this session due to not feeling well.  Pt tolerated sitting EOB ~30 mins while engaging in bathing, dressing, and having bed linen changed.  Pt with no active use of RUE during bathing or dressing tasks, requiring assistance to thread hospital gown over RUE.  Pt returned to supine max assist and engaged in rolling with mod assist to position in to sidelying on Rt side with pillows for improved positioning and support.    Therapy Documentation Precautions:  Precautions Precautions: Fall Required Braces or Orthoses: Sling Restrictions Weight Bearing Restrictions: Yes RUE Weight Bearing: Non weight bearing Other Position/Activity Restrictions: Sling with activity for comfort, no ROM  restrictions General:   Vital Signs: Therapy Vitals Pulse Rate: (!) 56 BP: 114/68 Pain:  Pt with c/o pain 5/10 in B hips, premedicated.   Therapy/Group: Individual Therapy  Rosalio Loud 03/12/2020, 12:34 PM
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
## 2020-03-12 NOTE — Progress Notes (Signed)
Pt lying in bed, in pain frequently through out the day no relief from oxy 10mg  given PRN from [facility]. States she feels like it is a shooting pain, down around the back of her hamstrings.   [facility]

## 2020-03-12 NOTE — Progress Notes (Signed)
Orthopedic Tech Progress Note Patient Details:  Emily Rice [birth year] [id number] Ordered brace Patient ID: Emily Rice, female   DOB: 12-23-1960, 60 y.o.   MRN: [id number]   Emily Rice 03/12/2020, 12:39 PM

## 2020-03-12 NOTE — Progress Notes (Signed)
Physical Therapy Session Note  Patient Details  Name: Emily Rice MRN: [id number] Date of Birth: [birth year]  Today's Date: 03/12/2020 PT Individual Time: 1300-1400 PT Individual Time Calculation (min): 60 min   Short Term Goals: Week 1:  PT Short Term Goal 1 (Week 1): Patient will perform bed mobility with mod A consistently. PT Short Term Goal 2 (Week 1): Patient will perform basic transfer with mod A of 1 person. PT Short Term Goal 3 (Week 1): Patient will initiate gait training.  Skilled Therapeutic Interventions/Progress Updates:   Pt received supine in bed and agreeable to PT. PT assisted pt to don thigh high ted hose at bed level. Supine>sit transfer with total A on the L side of bed to limit use of RUE with transfer into sitting. Pt still required max cues and facilitation to limit use of the RUE. Once EOB pt reporting extreme ischial tuberosity pain and requesting to return to supine. PT attempted to assist pt to perform sit<>stand to off load gluteal surface but pt actively resisting transfer to standing. Sit>supine with total A. +2 assist to scoot to [facility] once in supine with assist from PT and husband.   PT obtained TIS WC and stedy to improve success of transfer to [facility]. Supine>sit with mod assist through log roll on the R. Sitting tolerance x 2 minutes with supervision assist-CGA from PT with only mild discomfort on pelvis. RUE stabilized in envelope sling to limit use in transfer. Sit>stand in stedy with min assist and LUE to pull into sitting. Pt transported to TIS WC in stedy with min assist. Pt positioned in TIS WC with max assist due to pelvis/ischial tubersity pain initially, but improved with adequate lumbar support. (question Sciatic nerve irritation causing pain.)  Pt left sitting in WC with call bell in reach with all needs met and husband present.      Therapy Documentation Precautions:  Precautions Precautions: Fall Required Braces or Orthoses:  Sling Restrictions Weight Bearing Restrictions: Yes RUE Weight Bearing: Non weight bearing Other Position/Activity Restrictions: Sling with activity for comfort, no ROM restrictions Pain: Pain Assessment Pain Scale: 0-10 Pain Score: 5  Pain Location: Back    Therapy/Group: Individual Therapy  Lorie Phenix 03/12/2020, 5:46 PM

## 2020-03-12 NOTE — Progress Notes (Signed)
Speech Language Pathology Daily Session Note  Patient Details  Name: Trenice Mesa MRN: [id number] Date of Birth: May 03, 1960  Today's Date: 03/12/2020 SLP Individual Time: 1430-1530 SLP Individual Time Calculation (min): 60 min  Short Term Goals: Week 1: SLP Short Term Goal 1 (Week 1): Patient will demonstrate orientation to place, time and situation with Min verbal cues. SLP Short Term Goal 2 (Week 1): Patient will demonstrate sustained attention to functional tasks for 15 minutes with Mod verbal cues SLP Short Term Goal 3 (Week 1): Patient will demonstrate functional problem solving for basic and familiar tasks with Mod verbal cues. SLP Short Term Goal 4 (Week 1): Patient will demonstrate intellectual awareness of deficits with Mod A verbal cues.  Skilled Therapeutic Interventions: Pt participating in skilled ST with focus on cognitive goals, pt up in wheelchair and agreeable to participate despite pain. Pt husband present throughout tx session. Pt participating in mildly complex card sorting game with 90% acc independent, verbal cues increased to 100%. At this point, pt's pain become too much in [facility] and nursing assisted SLP with transfer to bed via the Steady. SLP facilitating sustained attention task by providing min A verbal cues. Copies of exercise left with husband for continued practice. Pt left in bed with alarm set and all needs mets. Cont ST POC.  Pain Pain Assessment Pain Scale: 0-10 Pain Score: 5  Pain Location: Back  Therapy/Group: Individual Therapy  Tacey Ruiz 03/12/2020, 3:30 PM

## 2020-03-13 DIAGNOSIS — M791 Myalgia, unspecified site: Secondary | ICD-10-CM

## 2020-03-13 MED ORDER — METHOCARBAMOL 500 MG PO TABS
500.0000 mg | ORAL_TABLET | Freq: Three times a day (TID) | ORAL | Status: DC
  Administered 2020-03-13 – 2020-03-29 (×48): 500 mg via ORAL
  Filled 2020-03-13 (×48): qty 1

## 2020-03-13 NOTE — Progress Notes (Signed)
patient reports less pain in hips and legs after topamax and gabapentin dose
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
## 2020-03-14 ENCOUNTER — Inpatient Hospital Stay (HOSPITAL_COMMUNITY): Payer: Medicare HMO | Admitting: Speech Pathology

## 2020-03-14 ENCOUNTER — Inpatient Hospital Stay (HOSPITAL_COMMUNITY): Payer: Medicare HMO

## 2020-03-14 DIAGNOSIS — S42294S Other nondisplaced fracture of upper end of right humerus, sequela: Secondary | ICD-10-CM

## 2020-03-14 DIAGNOSIS — G43709 Chronic migraine without aura, not intractable, without status migrainosus: Secondary | ICD-10-CM

## 2020-03-14 LAB — COMPREHENSIVE METABOLIC PANEL
ALT: 23 U/L (ref 0–44)
AST: 49 U/L — ABNORMAL HIGH (ref 15–41)
Albumin: 3.3 g/dL — ABNORMAL LOW (ref 3.5–5.0)
Alkaline Phosphatase: 126 U/L (ref 38–126)
Anion gap: 13 (ref 5–15)
BUN: 11 mg/dL (ref 6–20)
CO2: 26 mmol/L (ref 22–32)
Calcium: 9.3 mg/dL (ref 8.9–10.3)
Chloride: 100 mmol/L (ref 98–111)
Creatinine, Ser: 0.92 mg/dL (ref 0.44–1.00)
GFR, Estimated: >60 mL/min (ref >60)
Glucose, Bld: 96 mg/dL (ref 70–99)
Potassium: 3.9 mmol/L (ref 3.5–5.1)
Sodium: 139 mmol/L (ref 135–145)
Total Bilirubin: 1.1 mg/dL (ref 0.3–1.2)
Total Protein: 6.6 g/dL (ref 6.5–8.1)

## 2020-03-14 LAB — CBC
HCT: 31.7 % — ABNORMAL LOW (ref 36.0–46.0)
Hemoglobin: 10.6 g/dL — ABNORMAL LOW (ref 12.0–15.0)
MCH: 28.5 pg (ref 26.0–34.0)
MCHC: 33.4 g/dL (ref 30.0–36.0)
MCV: 85.2 fL (ref 80.0–100.0)
Platelets: 245 10*3/uL (ref 150–400)
RBC: 3.72 MIL/uL — ABNORMAL LOW (ref 3.87–5.11)
RDW: 17.9 % — ABNORMAL HIGH (ref 11.5–15.5)
WBC: 8.6 10*3/uL (ref 4.0–10.5)
nRBC: 0.9 % — ABNORMAL HIGH (ref 0.0–0.2)

## 2020-03-14 NOTE — Progress Notes (Signed)
Occupational Therapy Session Note  Patient Details  Name: Emily Rice MRN: [id number] Date of Birth: 1960/12/29  Today's Date: 03/14/2020 OT Individual Time: 6759-1638 OT Individual Time Calculation (min): 54 min  and Today's Date: 03/14/2020 OT Missed Time: 20 Minutes Missed Time Reason: Patient fatigue   Short Term Goals: Week 1:  OT Short Term Goal 1 (Week 1): Pt will maintain arousal to task with no more than min A OT Short Term Goal 2 (Week 1): Pt will complete UB bathing with compensatory strategies with min A OT Short Term Goal 3 (Week 1): Pt will don pants with mod A  Skilled Therapeutic Interventions/Progress Updates:    Pt received supine, eyes closed and c/o high fatigue. Pt requested to wait a bit for session to begin 2/2 fatigue. Initial 20 min missed. Upon re-entering room pt was agreeable to attempt ADLs. She completed bed mobility with mod A and cueing for technique. Throughout session pt required heavy cueing for adherence to RUE NWB. Sling was most effective in preventing weightbearing. Pt completed stand pivot transfer to the TIS w/c with mod A from EOB. At the sink she completed oral care with set up assist and min cueing for sequencing. Visual deficits obvious as pt had difficulty telling where water was to rinse her toothbrush. UB bathing completed with min A to reach under LUE. Mod A to don shirt 2/2 shirt being tight, as well as cueing for hemi technique. Pt requested to use the bathroom and completed stand pivot transfer with mod A. Max A for clothing management. She voided urine and completed hygiene while seated with min A. She returned to the TIS w/c and then transferred back to bed 2/2 progressing fatigue. Gentle PROM completed to her RUE, as well as 1x20 gentle AROM- gross grasp, wrist, bicep. Pt with improvements in radial nerve distribution, especially in finger extension. Pt was left supine with all needs met, bed alarm set.   Therapy Documentation Precautions:   Precautions Precautions: Fall Required Braces or Orthoses: Sling Restrictions Weight Bearing Restrictions: Yes RUE Weight Bearing: Non weight bearing Other Position/Activity Restrictions: Sling with activity for comfort, no ROM restrictions General: General OT Amount of Missed Time: 20 Minutes  Therapy/Group: Individual Therapy  Curtis Sites 03/14/2020, 9:50 AM
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
## 2020-03-14 NOTE — Progress Notes (Signed)
Speech Language Pathology Daily Session Note  Patient Details  Name: Emily Rice MRN: [id number] Date of Birth: 12/31/1960  Today's Date: 03/14/2020 SLP Individual Time: 1400-1455 SLP Individual Time Calculation (min): 55 min  Short Term Goals: Week 1: SLP Short Term Goal 1 (Week 1): Patient will demonstrate orientation to place, time and situation with Min verbal cues. SLP Short Term Goal 2 (Week 1): Patient will demonstrate sustained attention to functional tasks for 15 minutes with Mod verbal cues SLP Short Term Goal 3 (Week 1): Patient will demonstrate functional problem solving for basic and familiar tasks with Mod verbal cues. SLP Short Term Goal 4 (Week 1): Patient will demonstrate intellectual awareness of deficits with Mod A verbal cues.  Skilled Therapeutic Interventions: Skilled treatment session focused on cognitive goals. SLP facilitated session by providing Min A verbal and visual cues with extra time for functional problem solving during a basic money management task. SLP also initiated the use of a memory notebook to maximize recall and carryover of daily information. Patient requested to use the bathroom and was transferred to the commode with +2 assist via the [place]. Patient was continent of bladder. Patient left upright in bed with alarm on and all needs within reach. Continue with current plan of care.      Pain No/Denies Pain   Therapy/Group: Individual Therapy  Rachid Parham 03/14/2020, 3:09 PM

## 2020-03-14 NOTE — Progress Notes (Signed)
Physical Therapy Session Note  Patient Details  Name: Emily Rice MRN: [id number] Date of Birth: 03/05/1961  Today's Date: 03/14/2020 PT Individual Time: 5361-4431 PT Individual Time Calculation (min): 41 min   Short Term Goals: Week 1:  PT Short Term Goal 1 (Week 1): Patient will perform bed mobility with mod A consistently. PT Short Term Goal 2 (Week 1): Patient will perform basic transfer with mod A of 1 person. PT Short Term Goal 3 (Week 1): Patient will initiate gait training.  Skilled Therapeutic Interventions/Progress Updates:   Received pt supine in bed, pt agreeable to therapy, and reported 7/10 pain in buttocks/low back (premedicated). Repositioning, rest brakes, and distraction done to reduce pain levels. Session with emphasis on functional mobility/transfers, generalized strengthening, dynamic standing balance/coordination, and improved activity tolerance. Pt transferred supine<>sitting EOB with min/mod A with cues to maintain RUE NWB precautions. Pt reported mild dizziness; symptoms resolved within 2 minutes. Pt required 2 attempts to stand in [place] with mod A and increased time due to increased pain. Pt required cues for anterior weight shifting and upright posture. Pt transported dependently to TIS WC via [place]. Increased time spent adjusting WC legrests to get them to comfortable height for pt. Dependent transport to dayroom and pt performed seated BLE strengthening on Kinetron at 20 cm/sec for 1 minute x 3 trials with emphasis on glute/quad strengthening. Of note, due to size of TIS WC, pt unable to get feet completely on footplate resulting in pt primarily using forefoot during activity. Pt transported back to room in TIS [facility] total A and agreed to sit upright for a little while. Concluded session with pt semi-reclined in TIS WC, needs within reach, and seatbelt alarm on. Husband present at bedside.    Therapy Documentation Precautions:  Precautions Precautions: Fall Required  Braces or Orthoses: Sling Restrictions Weight Bearing Restrictions: Yes RUE Weight Bearing: Non weight bearing Other Position/Activity Restrictions: Sling with activity for comfort, no ROM restrictions  Therapy/Group: Individual Therapy Martin Majestic PT, DPT   03/14/2020, 7:30 AM

## 2020-03-15 ENCOUNTER — Inpatient Hospital Stay (HOSPITAL_COMMUNITY): Payer: Medicare HMO

## 2020-03-15 ENCOUNTER — Inpatient Hospital Stay (HOSPITAL_COMMUNITY): Payer: Medicare HMO | Admitting: Speech Pathology

## 2020-03-15 ENCOUNTER — Encounter (HOSPITAL_COMMUNITY): Payer: Medicare HMO | Admitting: Psychology

## 2020-03-15 DIAGNOSIS — F3181 Bipolar II disorder: Secondary | ICD-10-CM

## 2020-03-15 DIAGNOSIS — S066X1S Traumatic subarachnoid hemorrhage with loss of consciousness of 30 minutes or less, sequela: Secondary | ICD-10-CM

## 2020-03-15 DIAGNOSIS — R52 Pain, unspecified: Secondary | ICD-10-CM

## 2020-03-15 DIAGNOSIS — S066X1A Traumatic subarachnoid hemorrhage with loss of consciousness of 30 minutes or less, initial encounter: Secondary | ICD-10-CM

## 2020-03-15 MED ORDER — BOOST / RESOURCE BREEZE PO LIQD CUSTOM
1.0000 | ORAL | Status: DC
  Administered 2020-03-15 – 2020-03-21 (×5): 1 via ORAL

## 2020-03-15 MED ORDER — ENSURE ENLIVE PO LIQD
237.0000 mL | ORAL | Status: DC
  Administered 2020-03-16 – 2020-03-27 (×10): 237 mL via ORAL

## 2020-03-15 MED ORDER — ESCITALOPRAM OXALATE 10 MG PO TABS
5.0000 mg | ORAL_TABLET | Freq: Every day | ORAL | Status: DC
  Administered 2020-03-15 – 2020-04-04 (×21): 5 mg via ORAL
  Filled 2020-03-15 (×21): qty 1
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
## 2020-03-15 NOTE — Progress Notes (Signed)
Speech Language Pathology Daily Session Note  Patient Details  Name: Marrah Vanevery MRN: [id number] Date of Birth: 1960/08/01  Today's Date: 03/15/2020 SLP Individual Time: 0830-0910 SLP Individual Time Calculation (min): 40 min  Short Term Goals: Week 1: SLP Short Term Goal 1 (Week 1): Patient will demonstrate orientation to place, time and situation with Min verbal cues. SLP Short Term Goal 2 (Week 1): Patient will demonstrate sustained attention to functional tasks for 15 minutes with Mod verbal cues SLP Short Term Goal 3 (Week 1): Patient will demonstrate functional problem solving for basic and familiar tasks with Mod verbal cues. SLP Short Term Goal 4 (Week 1): Patient will demonstrate intellectual awareness of deficits with Mod A verbal cues.  Skilled Therapeutic Interventions: Skilled treatment session focused on cognitive goals. Upon arrival, patient was awake in bed and reported feeling "down" today after spending last night "thinking about everything that has been going on." SLP provided emotional support. SLP also facilitated session by initiating a medication management task. Patient recalled the functions of 90% of her medications and required cues for recall of her new medications since admission. Patient sustained attention to task for ~20 minutes with Min verbal cues for redirection. Patient left upright in bed with alarm on and all needs within reach. Continue with current plan of care.      Pain Mild discomfort in bed   Therapy/Group: Individual Therapy  Taura Lamarre 03/15/2020, 10:04 AM
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
## 2020-03-15 NOTE — Progress Notes (Signed)
Occupational Therapy Session Note  Patient Details  Name: Emily Rice MRN: [id number] Date of Birth: [birth year]  Today's Date: 03/15/2020 OT Individual Time: 4235-3614 OT Individual Time Calculation (min): 42 min   Session 2: OT Individual Time: 1130-1200 OT Individual Time Calculation (min): 30 min    Short Term Goals: Week 1:  OT Short Term Goal 1 (Week 1): Pt will maintain arousal to task with no more than min A OT Short Term Goal 2 (Week 1): Pt will complete UB bathing with compensatory strategies with min A OT Short Term Goal 3 (Week 1): Pt will don pants with mod A  Skilled Therapeutic Interventions/Progress Updates:    Session 1: Pt received supine, more alert. Pt completed bed mobility to EOB with mod A for lifting trunk. Pt reporting high levels of pain in her R hip and was crying out, offloading pain in her R hip and having L lateral lean, and achieving some relief. After several minutes pt was able to come back to sitting equally on B hips, and reported pain was manageable. Called RN and asked for any pain medication, per pt request. Mod cueing for RUE NWB status. Pt stood with hemi walker from EOB with mod A. She was able to stand briefly before needing to return to supine. Pt completed bed mobility to supine with min A. Pt was able to bridge her hips and remove pants with mod A. She completed peri hygiene with set up assist while supine. She donned new pants with min A. Pt was left supine with all needs met, bed alarm set.    Session 2: Pt received supine with improvement in pain, unrated, pt agreeable to OT session. Pt completed bed mobility to EOB with mod A. She required cueing for body mechanics EOB to promote neutral spinal and pelvic alignment. Pt doffed shirt with mod A and min cueing. Pt completed UB bathing with min HOH for the RUE. She donned new shirt with mod A, good carryover of hemi technique. Mod cueing for RUE NWB status. Pt completed sit > stand with mod A from  EOB with use of hemi walker for her LUE. Pt took 3 steps forward with mod A and then stated "I think i'm going to faint". Pt was able to take steps back to EOB to be quickly seated. BP was obtained once pt took a seated rest break- 125/95. Pt was assisted back to supine with all needs met, bed alarm set.   Therapy Documentation Precautions:  Precautions Precautions: Fall Required Braces or Orthoses: Sling Restrictions Weight Bearing Restrictions: Yes RUE Weight Bearing: Non weight bearing Other Position/Activity Restrictions: Sling with activity for comfort, no ROM restrictions  Therapy/Group: Individual Therapy  Curtis Sites 03/15/2020, 6:56 AM

## 2020-03-15 NOTE — Progress Notes (Signed)
Physical Therapy Session Note  Patient Details  Name: Emily Rice MRN: [id number] Date of Birth: 06-29-1960  Today's Date: 03/15/2020 PT Individual Time: 1415-1530 PT Individual Time Calculation (min): 75 min   Short Term Goals: Week 1:  PT Short Term Goal 1 (Week 1): Patient will perform bed mobility with mod A consistently. PT Short Term Goal 2 (Week 1): Patient will perform basic transfer with mod A of 1 person. PT Short Term Goal 3 (Week 1): Patient will initiate gait training.  Skilled Therapeutic Interventions/Progress Updates:    Patient received supine in bed, agreeable to PT. She appears very lethargic and reports 6/10 pain in bottom at rest, premedicated. PT providing repositioning, rest breaks and distractions to assist with pain management. PT lowering wc height to accommodate for addition of ROHO cushion for B ischial tub and coccyx pain. She was able to come to sit edge of bed with CGA and verbal cues to maintain NWB to R UE. Once sitting, patient without complaint of dizziness. She was able to transfer to wc via stand pivot with MinA. PT propelling patient in wc to therapy gym for time management and energy conservation. Patient requesting to see xrays. PT able to show patient xrays, orient to anatomy and location of fxs. Patient without further questions at this time. She was able to come to standing with hemiwalker and MinA, able to remain standing with CGA to complete dynamic reaching task. Patient with onset of dizziness after standing for ~19mins. Prolonged rest breaks needed for pain management. Patient returning to room in wc, transferring back to bed via stand pivot with MinA. Bed alarm on, call light within reach.   Therapy Documentation Precautions:  Precautions Precautions: Fall Required Braces or Orthoses: Sling Restrictions Weight Bearing Restrictions: Yes RUE Weight Bearing: Non weight bearing Other Position/Activity Restrictions: Sling with activity for comfort,  no ROM restrictions    Therapy/Group: Individual Therapy  Elizebeth Koller, PT, DPT, CBIS  03/15/2020, 7:46 AM
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
## 2020-03-15 NOTE — Progress Notes (Addendum)
Patient ID: Emily Rice, female   DOB: 03/29/1960, 60 y.o.   MRN: [id number]  Spoke with husband via telephone and met with pt to discuss team conference goals of supervision and target discharge 1/25. Both pleased she is making progress, husband had questions regarding insurance coverage and that is is factually 24/7 when discharged. Discussed it is 24/7 for safety and disorientation at night time. Will know more closer to time and have family do hands on care prior to discharge home. Aware if moe questions come up to contact me and when Harlan Stains returns she will take over the case

## 2020-03-16 ENCOUNTER — Inpatient Hospital Stay (HOSPITAL_COMMUNITY): Payer: Medicare HMO

## 2020-03-16 ENCOUNTER — Inpatient Hospital Stay (HOSPITAL_COMMUNITY): Payer: Medicare HMO | Admitting: Speech Pathology

## 2020-03-16 MED ORDER — METOPROLOL TARTRATE 12.5 MG HALF TABLET
12.5000 mg | ORAL_TABLET | Freq: Every day | ORAL | Status: DC
  Administered 2020-03-17 – 2020-04-04 (×13): 12.5 mg via ORAL
  Filled 2020-03-16 (×18): qty 1
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
## 2020-03-16 NOTE — Progress Notes (Signed)
Speech Language Pathology Weekly Progress and Session Note  Patient Details  Name: Emily Rice MRN: [id number] Date of Birth: [birth year]  Beginning of progress report period: March 08, 2020 End of progress report period: March 16, 2020  Today's Date: 03/16/2020 SLP Individual Time: 1400-1450 SLP Individual Time Calculation (min): 50 min  Missed Time: 10 mins, BP issues  Short Term Goals: Week 1: SLP Short Term Goal 1 (Week 1): Patient will demonstrate orientation to place, time and situation with Min verbal cues. SLP Short Term Goal 1 - Progress (Week 1): Met SLP Short Term Goal 2 (Week 1): Patient will demonstrate sustained attention to functional tasks for 15 minutes with Mod verbal cues SLP Short Term Goal 2 - Progress (Week 1): Met SLP Short Term Goal 3 (Week 1): Patient will demonstrate functional problem solving for basic and familiar tasks with Mod verbal cues. SLP Short Term Goal 3 - Progress (Week 1): Met SLP Short Term Goal 4 (Week 1): Patient will demonstrate intellectual awareness of deficits with Mod A verbal cues. SLP Short Term Goal 4 - Progress (Week 1): Met    New Short Term Goals: Week 2: SLP Short Term Goal 1 (Week 2): Patient will demonstrate functional problem solving for mildly complex tasks with Min verbal cues. SLP Short Term Goal 2 (Week 2): Patient will recall new, daily information with use of memory compensatory strategies with Min verbal cues. SLP Short Term Goal 3 (Week 2): Patient will demonstrate sustained attention to functional tasks for 45 minutes with Min verbal cues for redirection. SLP Short Term Goal 4 (Week 2): Patient will self-monitor and correct errors during functional tasks with Min verbal cus for redirection.  Weekly Progress Updates: Patient has made excellent gains and has met 4 of 4 STGs this reporting period. Currently, patient demonstrates behaviors consistent with a Rancho Level VI-emerging VII and requires overall Mod A verbal  cues to complete functional and familiar tasks safely in regards to problem solving, recall, attention and awareness. Patient and family education ongoing. Patient would benefit from continued skilled SLP intervention to maximize her cognitive functioning and overall functional independence prior to discharge.      Intensity: Minumum of 1-2 x/day, 30 to 90 minutes Frequency: 3 to 5 out of 7 days Duration/Length of Stay: 04/05/20 Treatment/Interventions: Cognitive remediation/compensation;Internal/external aids;Speech/Language facilitation;Environmental Environmental consultant;Therapeutic Activities;Functional tasks;Patient/family education   Daily Session  Skilled Therapeutic Interventions: Skilled treatment session focused on cognitive goals. Upon arrival, patient was lethargic but agreeable to participate in treatment session. Patient requested to do therapy outside of the room today and initiated sitting EOB. BP taken: 116/81. Patient transferred to the wheelchair via the [place]. SLP initiated session by providing overall Min-Mod A verbal cues for problem solving with starting to organize a QD pill box. Patient reported dizziness and lightheadedness, BP taken: 104/61. Patient transferred back to bed via the [facility] and left in supine with alarm on and all needs within reach. Continue with current plan of care.     Pain No/Denies Pain   Therapy/Group: Individual Therapy  Nakul Avino 03/16/2020, 6:21 AM

## 2020-03-16 NOTE — Progress Notes (Signed)
Occupational Therapy Session Note  Patient Details  Name: Emily Rice MRN: [id number] Date of Birth: [birth year]  Today's Date: 03/16/2020 OT Individual Time: 8569-4370 OT Individual Time Calculation (min): 75 min    Short Term Goals: Week 1:  OT Short Term Goal 1 (Week 1): Pt will maintain arousal to task with no more than min A OT Short Term Goal 2 (Week 1): Pt will complete UB bathing with compensatory strategies with min A OT Short Term Goal 3 (Week 1): Pt will don pants with mod A  Skilled Therapeutic Interventions/Progress Updates:    Pt received supine with eyes closed, no c/o pain at rest. Pt was willing to come OOB for ADLs at the sink. She completed bed mobility with mod A. Pt completed stand pivot transfer with the use of a hemi walker with mod A overall. Min cueing for RUE NWB. Oral care and grooming tasks with set up assist. Pt completed UB bathing and dressing with min A for reaching under the LUE and mod A to don shirt. Pt used hemi walker to complete 7 ft of functional mobility into the bathroom with mod A. Pt completed toileting tasks with mod A overall. Pt completed peri hygiene with min A in standing. Pt donned LB clothing with mod A. Pt opted to complete stand pivot to w/c instead of functional mobility back to EOB. Min A for functional mobility. Pt completed another stand pivot back to bed. From supine she was able to bridge for k-pad placement. Pt's RUE was passively ranged to ensure maintenance of muscle elasticity. Pt was left supine with all needs met, bed alarm set.   BP EOB: 116/81 BP in chair: 95/66 BP after sitting for 5 minutes: 112/70  Therapy Documentation Precautions:  Precautions Precautions: Fall Required Braces or Orthoses: Sling Restrictions Weight Bearing Restrictions: Yes RUE Weight Bearing: Non weight bearing Other Position/Activity Restrictions: Sling with activity for comfort, no ROM restrictions  Therapy/Group: Individual Therapy  Curtis Sites 03/16/2020, 12:54 PM
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
## 2020-03-16 NOTE — Progress Notes (Signed)
Physical Therapy Weekly Progress Note  Patient Details  Name: Emily Rice MRN: [id number] Date of Birth: Apr 23, 1960  Beginning of progress report period: March 09, 2020 End of progress report period: March 16, 2020  Patient has met 3 of 3 short term goals.  Patient is making slow, but steady progress toward her goals. She remains limited by pelvic pain and fluctuating BP with activity. She requires ModA for transfers with hemi walker and consistent verbal cues to maintain NWB status to RUE throughout all functional mobility. She has progressed to ambulating short distances with hemiwalker and MinA/ModA. Patient does become symptomatic of low BP with this, however.   Patient continues to demonstrate the following deficits muscle weakness, decreased cardiorespiratoy endurance, decreased motor planning, decreased initiation, decreased attention, decreased awareness, decreased problem solving, decreased safety awareness, decreased memory and delayed processing and decreased sitting balance, decreased standing balance, decreased postural control, decreased balance strategies and difficulty maintaining precautions and therefore will continue to benefit from skilled PT intervention to increase functional independence with mobility.  Patient progressing toward long term goals..  Continue plan of care.  PT Short Term Goals Week 1:  PT Short Term Goal 1 (Week 1): Patient will perform bed mobility with mod A consistently. PT Short Term Goal 1 - Progress (Week 1): Met PT Short Term Goal 2 (Week 1): Patient will perform basic transfer with mod A of 1 person. PT Short Term Goal 2 - Progress (Week 1): Met PT Short Term Goal 3 (Week 1): Patient will initiate gait training. PT Short Term Goal 3 - Progress (Week 1): Met Week 2:  PT Short Term Goal 1 (Week 2): Patient will complete basic transfers with MinA consistently PT Short Term Goal 2 (Week 2): Patient will ambulate >25f with LRAD and MinA PT Short  Term Goal 3 (Week 2): Patient will tolerate sitting up in wc >1 hour outside of therapy    Therapy Documentation Precautions:  Precautions Precautions: Fall Required Braces or Orthoses: Sling Restrictions Weight Bearing Restrictions: Yes RUE Weight Bearing: Non weight bearing Other Position/Activity Restrictions: Sling with activity for comfort, no ROM restrictions   JDebbora Dus1/07/2020, 7:39 AM

## 2020-03-16 NOTE — Progress Notes (Signed)
Physical Therapy Session Note  Patient Details  Name: Zenaya Ulatowski MRN: [id number] Date of Birth: July 01, 1960  Today's Date: 03/16/2020 PT Individual Time: 1015-1050 PT Individual Time Calculation (min): 35 min Missed 10 mins due to low low BP/ fatigue.   Short Term Goals: Week 1:  PT Short Term Goal 1 (Week 1): Patient will perform bed mobility with mod A consistently. PT Short Term Goal 2 (Week 1): Patient will perform basic transfer with mod A of 1 person. PT Short Term Goal 3 (Week 1): Patient will initiate gait training.  Skilled Therapeutic Interventions/Progress Updates:    Patient received supine in bed, agreeable to PT, but very lethargic- difficulty keeping her eyes open. She reports "a lot" of pain in bottom, but did not offer numerical rating. She was able to roll B with CGA and extra time due to pain. Supervision to come to sit edge of bed with extra time due to pain. BP supine: 101/64 (77), HR 55BPM. BP in sitting: 99/ 66 (76), HR 57BPM. Patient able to come to stand with MinA and hemiwalker, TED hose on. BP in standing: 74/48 (58), HR 60BPM. Patient with reports of feeling lightheaded and increasing R lateral lean with eyes closed. Patient returning supine to allow for safe adjustment to BP. PT unable to find RN, NT alerted about patients response to position change. Bed alarm on, bed in lowest position, call light within reach.   Therapy Documentation Precautions:  Precautions Precautions: Fall Required Braces or Orthoses: Sling Restrictions Weight Bearing Restrictions: Yes RUE Weight Bearing: Non weight bearing Other Position/Activity Restrictions: Sling with activity for comfort, no ROM restrictions    Therapy/Group: Individual Therapy  Elizebeth Koller, PT, DPT, CBIS  03/16/2020, 7:38 AM

## 2020-03-17 ENCOUNTER — Inpatient Hospital Stay (HOSPITAL_COMMUNITY): Payer: Medicare HMO | Admitting: Speech Pathology

## 2020-03-17 ENCOUNTER — Inpatient Hospital Stay (HOSPITAL_COMMUNITY): Payer: Medicare HMO

## 2020-03-17 MED ORDER — AMANTADINE HCL 100 MG PO CAPS
100.0000 mg | ORAL_CAPSULE | Freq: Every day | ORAL | Status: DC
  Administered 2020-03-18 – 2020-03-29 (×12): 100 mg via ORAL
  Filled 2020-03-17 (×12): qty 1

## 2020-03-17 NOTE — Progress Notes (Signed)
Speech Language Pathology Daily Session Note  Patient Details  Name: Emily Rice MRN: [id number] Date of Birth: 10-25-1960  Today's Date: 03/17/2020 SLP Individual Time: 1300-1355 SLP Individual Time Calculation (min): 55 min  Short Term Goals: Week 2: SLP Short Term Goal 1 (Week 2): Patient will demonstrate functional problem solving for mildly complex tasks with Min verbal cues. SLP Short Term Goal 2 (Week 2): Patient will recall new, daily information with use of memory compensatory strategies with Min verbal cues. SLP Short Term Goal 3 (Week 2): Patient will demonstrate sustained attention to functional tasks for 45 minutes with Min verbal cues for redirection. SLP Short Term Goal 4 (Week 2): Patient will self-monitor and correct errors during functional tasks with Min verbal cus for redirection.  Skilled Therapeutic Interventions: Skilled treatment session focused on cognitive goals. Upon arrival, patient was semi-reclined in bed and appeared lethargic. Patient had completed her lunch and appeared to have spilled water and chocolate pudding all over herself and into the bed with minimal awareness. SLP facilitated session by providing extra time and Min A for patient to sit EOB. Patient removed her jacket and her shirt with Max verbal and tactile cues needed for problem solving and sequencing with task. Patient also required extra time and Mod verbal cue to remove pants and donn new pants in regards to problem solving. Patient was transferred to the wheelchair via the [facility] with Mod verbal cues needed for problem solving in order to change her linens. Patient's eyes remained closed throughout the majority of the session with decreased speech intelligibility at times due to lethargy. Patient also reported she had gotten her sleeping medicine early this morning which is why she is so lethargic, SLP reassured patient she was not administered sleeping medicine this morning. Patient transferred back  to bed at end of session. Patient left supine in bed with alarm on and all needs within reach. Continue with current plan of care.      Pain Intermittent pain in RUE with movement, patient repositioned   Therapy/Group: Individual Therapy  Daronte Shostak 03/17/2020, 3:19 PM

## 2020-03-17 NOTE — Progress Notes (Signed)
Physical Therapy Session Note  Patient Details  Name: Emily Rice MRN: [id number] Date of Birth: Aug 02, 1960  Today's Date: 03/17/2020 PT Individual Time: 1093-2355; 7322-0254 PT Individual Time Calculation (min): 35 min  and 40 minsToday's Date: 03/17/2020 PT Missed Time: 40 Minutes Missed Time Reason: Xray  Short Term Goals: Week 2:  PT Short Term Goal 1 (Week 2): Patient will complete basic transfers with MinA consistently PT Short Term Goal 2 (Week 2): Patient will ambulate >61ft with LRAD and MinA PT Short Term Goal 3 (Week 2): Patient will tolerate sitting up in wc >1 hour outside of therapy  Skilled Therapeutic Interventions/Progress Updates:    Session 1: Patient unavailable at beginning of session, out at xray. Upon return, patient received supine in bed, agreeable to PT. She reports 6/10 pain in R UE, premedicated. PT providing repositioning and distractions to assist with pain management. PT donning TED hose and socks TotalA. She was able to come to sit edge of bed with CGA and verbal cues to maintain NWB to RUE. Patient donning clean shirt with MaxA. She was able to transfer to wc via stand pivot with MinA, HHA. At the sink, patient able to complete morning ADLs including brushing teeth and washing face. Verbal cues to engage R UE to her pain tolerance. Patient agreeable to remaining up in wc, seatbelt alarm on, call light within reach, telesitter in place.   Session 2: Patient agreeable to make up session from AM. She was received supine in bed, asleep, but easy to wake. She reports 5/10 pain in R UE, premedicated. PT providing rest breaks, distractions and repositioning to assist with pain management. She was able to come to sit edge of bed with supervision and verbal cuing to maintain NWB to RUE. Patient donning jacket with MaxA. She was able to transfer to wc via stand pivot with MinA, HHA. PT propelling patient in wc to therapy gym for time management and energy conservation. She  was able to ambulate 7ftx2, 21ft x2, 33ftx2 with seated rest breaks between bouts. BP in sitting: 114/67 (81), HR 53BPM. Patient did require consistent verbal cues for sequencing with hemiwalker and required MinA for postural support. Patient with minimal carryover of gait sequence using hemiwalker from trial to trial. She reports decreased pelvic pain today, but continued to display very short step length B. Patient returning to room in wc, transferring to bed via stand pivot with MinA. Bed alarm on, bed in lowest position, call light within reach.   Therapy Documentation Precautions:  Precautions Precautions: Fall Required Braces or Orthoses: Sling Restrictions Weight Bearing Restrictions: Yes RUE Weight Bearing: Non weight bearing Other Position/Activity Restrictions: Sling with activity for comfort, no ROM restrictions    Therapy/Group: Individual Therapy  Elizebeth Koller, PT, DPT, CBIS  03/17/2020, 7:52 AM

## 2020-03-17 NOTE — Progress Notes (Signed)
Occupational Therapy Weekly Progress Note  Patient Details  Name: Emily Rice MRN: [id number] Date of Birth: February 16, 1961  Beginning of progress report period: March 09, 2020 End of progress report period: March 17, 2020  Today's Date: 03/17/2020 OT Individual Time: 1100-1158 OT Individual Time Calculation (min): 58 min    Patient has met 3 of 3 short term goals.  Pt has made steady progress this reporting period improving functional mobility to MOD A with hemi walker, ADLs to MOD A level for UB/LB BADL and toileting to MAX A overall for clothing management. Arousal has also improved but pt continues to be limited by orthostatic hypotension impacting OOB tolernace.  Patient continues to demonstrate the following deficits: muscle weakness, decreased cardiorespiratoy endurance, impaired timing and sequencing, unbalanced muscle activation and decreased coordination, decreased motor planning, decreased initiation, decreased attention, decreased awareness, decreased problem solving, decreased safety awareness, decreased memory and delayed processing and decreased sitting balance, decreased standing balance, decreased postural control, decreased balance strategies and difficulty maintaining precautions and therefore will continue to benefit from skilled OT intervention to enhance overall performance with BADL and iADL.  Patient progressing toward long term goals..  Continue plan of care.  OT Short Term Goals Week 1:  OT Short Term Goal 1 (Week 1): Pt will maintain arousal to task with no more than min A OT Short Term Goal 1 - Progress (Week 1): Met OT Short Term Goal 2 (Week 1): Pt will complete UB bathing with compensatory strategies with min A OT Short Term Goal 2 - Progress (Week 1): Met OT Short Term Goal 3 (Week 1): Pt will don pants with mod A OT Short Term Goal 3 - Progress (Week 1): Met Week 2:  OT Short Term Goal 1 (Week 2): Pt will consistently transfer to BSC/toilet with MIN A and  LRAD OT Short Term Goal 2 (Week 2): Pt will don shirt with MIN A OT Short Term Goal 3 (Week 2): Pt will recall hemi techniques wiht MIN VC OT Short Term Goal 4 (Week 2): Pt will tolerate being OOB consistently for 75% of sessions  Skilled Therapeutic Interventions/Progress Updates:    1:1. Pt received in bed with some" pain in RUE but agreeable to OT. Pt agreeable to gettingin chair. Vitals assessed as written below. Jacket donned with MAX A to thread RUE and wrap around back. MOD A power up and min A pivot steps using hemi walker EOB<>w/c<>BSC over toilet. Pt competes 2 standing trials at high low table 1-1.5 min each with dizziness increasing over time. With seated rest dizziness subsides and rebounds wht MIN A to power up and CGA for steadying. RN alerted that pt requesting pain medication at end of session. Exited session with pt seated in bed, exit alarm on and call light in reach   Supine: 87/53 Seated EOB with Knee high teds: 85/61  EOB therex performed: alt marches, lateral taps, heel/toe raises, and LAQ Seated EOB after therex: 101/65 Seated in w/c 99/66  3 min after 1st standing trial 104/66  Therapy Documentation Precautions:  Precautions Precautions: Fall Required Braces or Orthoses: Sling Restrictions Weight Bearing Restrictions: Yes RUE Weight Bearing: Non weight bearing Other Position/Activity Restrictions: Sling with activity for comfort, no ROM restrictions General:   Vital Signs: Therapy Vitals Temp: 98.5 F (36.9 C) Pulse Rate: 66 Resp: 16 BP: 97/60 Patient Position (if appropriate): Lying Oxygen Therapy SpO2: 100 % O2 Device: Room Air Pain:   ADL: ADL Eating: Set up Where Assessed-Eating: Bed level Grooming:  Unable to assess Upper Body Bathing: Unable to assess Lower Body Bathing: Unable to assess Upper Body Dressing: Unable to assess Lower Body Dressing: Maximal assistance Where Assessed-Lower Body Dressing: Edge of bed Toileting: Maximal  assistance Where Assessed-Toileting: Bed level Toilet Transfer: Unable to assess Tub/Shower Transfer Method: Unable to assess Vision   Perception    Praxis   Exercises:   Other Treatments:     Therapy/Group: Individual Therapy  Tonny Branch 03/17/2020, 6:38 AM
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
## 2020-03-18 ENCOUNTER — Inpatient Hospital Stay (HOSPITAL_COMMUNITY): Payer: Medicare HMO

## 2020-03-18 ENCOUNTER — Inpatient Hospital Stay (HOSPITAL_COMMUNITY): Payer: Medicare HMO | Admitting: Speech Pathology

## 2020-03-18 MED ORDER — SENNOSIDES-DOCUSATE SODIUM 8.6-50 MG PO TABS
2.0000 | ORAL_TABLET | Freq: Every day | ORAL | Status: DC
  Administered 2020-03-18 – 2020-04-04 (×17): 2 via ORAL
  Filled 2020-03-18 (×18): qty 2

## 2020-03-18 NOTE — Progress Notes (Signed)
Physical Therapy Session Note  Patient Details  Name: Emily Rice MRN: [id number] Date of Birth: [birth year]  Today's Date: 03/18/2020 PT Individual Time: 9024-0973; 5329-9242  PT Individual Time Calculation (min): 40 min and 30 mins  Short Term Goals: Week 2:  PT Short Term Goal 1 (Week 2): Patient will complete basic transfers with MinA consistently PT Short Term Goal 2 (Week 2): Patient will ambulate >61ft with LRAD and MinA PT Short Term Goal 3 (Week 2): Patient will tolerate sitting up in wc >1 hour outside of therapy  Skilled Therapeutic Interventions/Progress Updates:    Session 1: Patient received supine in bed, very lethargic, but agreeable to PT. She reports "mild" pain in R UE, premedicated. Denies pelvic pain. Patient with knee-high TED hose on. BP supine: 100/63 (74) HR 55BPM. She was able to come sit edge of bed with MinA and verbal cues to maintain NWB to RUE. BP in sitting: 98/64 (76), HR 60BPM. Patient reporting feeling light headed in this position. She was able to come to stand with ModA and remain standing with ModA with strong posterior bias and no attempts at weight shifting anteriorly. BP in standing: 87/57 (66), HR 64BPM. Patient symptomatic of orthostatic hypotension with difficulty maintaining her eyes open. Patient returned to supine with mild resolution of symptoms. PT donning thigh-high TED hose with MaxA. BP in sitting: 107/66 (78), HR 57BPM. ModA to come to stand and to remain standing, again with strong posterior bias. BP in standing: 89/54 (66) HR 62BPM. Patient again very symptomatic of orthostatic hypotension- returning supine. Patient remaining supine with bed in lowest position, bed alarm on, call light within reach.  Session 2: Patient received supine in bed, lethargic, but agreeable to PT. She reports "some pain" in R UE, premedicated. She required MinA to come sit edge of bed and was again symptomatic of drop in BP, despite thigh-high TED hose donned. She may  benefit from addition of abdominal binder to further assist with BP management. Patient agreeable to gentle PROM of R shoulder, elbow, wrist and fingers. She reports greatest pain in shoulder flexion and was able to tolerate up to ~30* of shoulder flexion. Patient returning supine, bed in lowest position, bed alarm on, call light within reach.    Therapy Documentation Precautions:  Precautions Precautions: Fall Required Braces or Orthoses: Sling Restrictions Weight Bearing Restrictions: Yes RUE Weight Bearing: Non weight bearing Other Position/Activity Restrictions: Sling with activity for comfort, no ROM restrictions    Therapy/Group: Individual Therapy  Karoline Caldwell, PT, DPT, CBIS  03/18/2020, 7:39 AM

## 2020-03-18 NOTE — Progress Notes (Signed)
Occupational Therapy Session Note  Patient Details  Name: Emily Rice MRN: [id number] Date of Birth: 04-28-1960  Today's Date: 03/18/2020 OT Individual Time: 8295-6213 OT Individual Time Calculation (min): 58 min    Short Term Goals: Week 1:  OT Short Term Goal 1 (Week 1): Pt will maintain arousal to task with no more than min A OT Short Term Goal 1 - Progress (Week 1): Met OT Short Term Goal 2 (Week 1): Pt will complete UB bathing with compensatory strategies with min A OT Short Term Goal 2 - Progress (Week 1): Met OT Short Term Goal 3 (Week 1): Pt will don pants with mod A OT Short Term Goal 3 - Progress (Week 1): Met  Skilled Therapeutic Interventions/Progress Updates:    1:1. Pt received in bed reporting L ankle pain and she "twisted it when I took myself to the bathroom-crawling." no note in chart about taking herself to bathroom and telesitter/bed alarm in place. Pt educated on not getting up without help and WB precautions. ?if pt dreamt incident and is confused. RN alerted. Pt completes stand pivot transfer with MIN A to power up with VC for hand placement on bed then transitioning to hemi walker. Pt doffs jacket at EOB with min A, increased time and VC for hemi strategies. Grooming at sink with setup. Pt doffs pull over shirt with MIN A to pull up back and dons shirt with MIN A to thread RUE. Pt ambulates 7' into bathroom and 10' to EOB out of bathroom with hemi walker with VC for sequence of walker and imrpoved weight shift to R. Pt completes toileting with MOD A overall for clothing amangement and S for hygiene. Exited session with pt seated in bed, exit alarm on and call light in reach    Supine: 102/66 EOB with teds donned 90/60 Sitting EOB 3 min 106/66  Therapy Documentation Precautions:  Precautions Precautions: Fall Required Braces or Orthoses: Sling Restrictions Weight Bearing Restrictions: Yes RUE Weight Bearing: Non weight bearing Other Position/Activity  Restrictions: Sling with activity for comfort, no ROM restrictions General:   Vital Signs: Therapy Vitals Temp: 97.8 F (36.6 C) Pulse Rate: (!) 58 Resp: 16 BP: 117/64 Patient Position (if appropriate): Lying Oxygen Therapy SpO2: 98 % O2 Device: Room Air Pain: Pain Assessment Pain Score: Asleep ADL: ADL Eating: Set up Where Assessed-Eating: Bed level Grooming: Unable to assess Upper Body Bathing: Unable to assess Lower Body Bathing: Unable to assess Upper Body Dressing: Unable to assess Lower Body Dressing: Maximal assistance Where Assessed-Lower Body Dressing: Edge of bed Toileting: Maximal assistance Where Assessed-Toileting: Bed level Toilet Transfer: Unable to assess Tub/Shower Transfer Method: Unable to assess Vision   Perception    Praxis   Exercises:   Other Treatments:     Therapy/Group: Individual Therapy  Tonny Branch 03/18/2020, 6:50 AM

## 2020-03-18 NOTE — Progress Notes (Signed)
Speech Language Pathology Daily Session Note  Patient Details  Name: Semaja Lymon MRN: [id number] Date of Birth: 1960/10/16  Today's Date: 03/18/2020 SLP Individual Time: 3382-5053 SLP Individual Time Calculation (min): 55 min  Short Term Goals: Week 2: SLP Short Term Goal 1 (Week 2): Patient will demonstrate functional problem solving for mildly complex tasks with Min verbal cues. SLP Short Term Goal 2 (Week 2): Patient will recall new, daily information with use of memory compensatory strategies with Min verbal cues. SLP Short Term Goal 3 (Week 2): Patient will demonstrate sustained attention to functional tasks for 45 minutes with Min verbal cues for redirection. SLP Short Term Goal 4 (Week 2): Patient will self-monitor and correct errors during functional tasks with Min verbal cus for redirection.  Skilled Therapeutic Interventions: Skilled treatment session focused on cognitive goals. Upon arrival, patient appeared lethargic while supine in bed. SLP facilitated session by providing Mod A verbal cues for sustained attention throughout tasks. Patient with intermittent language of confusion and questionable confabulation which was easily redirected. SLP provided extra time and overall Mod A verbal cues for short-term recall and functional problem solving during a basic medication management task that focused on identifying specific items on a medication label. Patient left upright in bed with alarm on and all needs within reach. Continue with current plan of care.      Pain No/Denies Pain   Therapy/Group: Individual Therapy  Idella Lamontagne 03/18/2020, 3:13 PM
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
## 2020-03-19 NOTE — Progress Notes (Signed)
Walked into room to find patient in stedy with NT, assisting patient back to bed after toileting and patient very slow to respond and body positioning was slumped. NT stated that patient had complained of dizziness after toileting and while at the sink, she started to close her eyes, stopped talking or responding and became limp. Assisted NT to place patient in bed. Took BP and pulse. Pulse was 49. VS recorded. Pt became more responsive, stated that she could "hear me now". Nurse assigned to patient came into room and resumed care.

## 2020-03-20 ENCOUNTER — Inpatient Hospital Stay (HOSPITAL_COMMUNITY): Payer: Medicare HMO

## 2020-03-20 ENCOUNTER — Inpatient Hospital Stay (HOSPITAL_COMMUNITY): Payer: Medicare HMO | Admitting: Speech Pathology

## 2020-03-20 NOTE — Progress Notes (Signed)
Speech Language Pathology Daily Session Note  Patient Details  Name: Emily Rice MRN: [id number] Date of Birth: 1961-01-07  Today's Date: 03/20/2020 SLP Individual Time: 7342-8768 SLP Individual Time Calculation (min): 56 min  Short Term Goals: Week 1: SLP Short Term Goal 1 (Week 1): Patient will demonstrate orientation to place, time and situation with Min verbal cues. SLP Short Term Goal 1 - Progress (Week 1): Met SLP Short Term Goal 2 (Week 1): Patient will demonstrate sustained attention to functional tasks for 15 minutes with Mod verbal cues SLP Short Term Goal 2 - Progress (Week 1): Met SLP Short Term Goal 3 (Week 1): Patient will demonstrate functional problem solving for basic and familiar tasks with Mod verbal cues. SLP Short Term Goal 3 - Progress (Week 1): Met SLP Short Term Goal 4 (Week 1): Patient will demonstrate intellectual awareness of deficits with Mod A verbal cues. SLP Short Term Goal 4 - Progress (Week 1): Met  Skilled Therapeutic Interventions: Pt was seen for skilled ST targeting cognitive goals. Pt was unable to recall any ST goals or activities with prompting, however did endorse recall of starting a medication management task once referenced by SLP. Pt required use of her medication list as an external aid to recall 95% of medication names and function. She required Min A verbal cues for verbal problem solving how to arrange 3X daily pills inside a QID pill box, however mostly Mod I with organization with organization of 1X and 2X daily pills within the pill box. She was also very accurate and independently used a "double checking" strategy to ensure accuracy. Pt required short rest breaks, a cold drink, and Min A verbal cues for redirection to task in a quiet environment. Pt did not finish medication management task in full due to time constraints and the extra time required for problem solving and upper extremity limitations. Pt left laying in bed with alarm set and  needs within reach. Continue per current plan of care.     Pain Pain Assessment Pain Scale: Faces Faces Pain Scale: Hurts a little bit Pain Type: Acute pain Pain Location: Arm Pain Orientation: Right Pain Descriptors / Indicators: Discomfort Pain Onset: On-going Pain Intervention(s): Repositioned;Distraction;Food Multiple Pain Sites: No  Therapy/Group: Individual Therapy  Arbutus Leas 03/20/2020, 7:25 AM

## 2020-03-21 ENCOUNTER — Inpatient Hospital Stay (HOSPITAL_COMMUNITY): Payer: Medicare HMO

## 2020-03-21 ENCOUNTER — Inpatient Hospital Stay (HOSPITAL_COMMUNITY): Payer: Medicare HMO | Admitting: Speech Pathology

## 2020-03-21 ENCOUNTER — Inpatient Hospital Stay (HOSPITAL_COMMUNITY): Payer: Medicare HMO | Admitting: Occupational Therapy

## 2020-03-21 NOTE — Progress Notes (Signed)
Speech Language Pathology Daily Session Note  Patient Details  Name: [place] MRN: [id number] Date of Birth: Sep 21, 1960  Today's Date: 03/21/2020 SLP Individual Time: 0730-0825 SLP Individual Time Calculation (min): 55 min  Short Term Goals: Week 2: SLP Short Term Goal 1 (Week 2): Patient will demonstrate functional problem solving for mildly complex tasks with Min verbal cues. SLP Short Term Goal 2 (Week 2): Patient will recall new, daily information with use of memory compensatory strategies with Min verbal cues. SLP Short Term Goal 3 (Week 2): Patient will demonstrate sustained attention to functional tasks for 45 minutes with Min verbal cues for redirection. SLP Short Term Goal 4 (Week 2): Patient will self-monitor and correct errors during functional tasks with Min verbal cus for redirection.  Skilled Therapeutic Interventions: Skilled treatment session focused on cognitive goals. Upon arrival, patient was awake in bed while eating breakfast. Patient appeared more awake and alert than she has been in previous SLP sessions. SLP facilitated session by providing extra time and overall Min A verbal cues for complex organization and problem solving during a scheduling task. Min verbal cues were also required for recall throughout task. Patient left upright in bed with alarm on and all needs within reach. Continue with current plan of care.      Pain Pain Assessment Pain Scale: 0-10 Pain Score: 3  Pain Location: Back Pain Orientation: Mid;Lower Pain Radiating Towards: low back butocks Patients Stated Pain Goal: 3 Pain Intervention(s): Repositioned;Medication (See eMAR)  Therapy/Group: Individual Therapy  Omkar Stratmann 03/21/2020, 12:25 PM

## 2020-03-21 NOTE — Progress Notes (Signed)
Physical Therapy Session Note  Patient Details  Name: Emily Rice MRN: [id number] Date of Birth: [birth year]  Today's Date: 03/21/2020 PT Individual Time: 1443-1540 PT Individual Time Calculation (min): 45 min   Short Term Goals: Week 2:  PT Short Term Goal 1 (Week 2): Patient will complete basic transfers with MinA consistently PT Short Term Goal 2 (Week 2): Patient will ambulate >30ft with LRAD and MinA PT Short Term Goal 3 (Week 2): Patient will tolerate sitting up in wc >1 hour outside of therapy  Skilled Therapeutic Interventions/Progress Updates:     Patient in recliner in the room upon PT arrival. Patient alert and agreeable to PT session. Patient reported 5/10 R upper extremity pain during session, RN made aware. PT provided repositioning, rest breaks, and distraction as pain interventions throughout session. Donned R upper extremity sling for mobility during session. B thigh high TEDs donned prior to and throughout session. No abdominal binder in the room, RN made aware.   Orthostatic Vitals: Sitting: BP 111/67, HR 50 (asymptomatic) Standing: BP 93/61, HR 58 (mild symptoms) Standing x3 min: unable to attain reading due to device error (no increase in symptoms)  Therapeutic Activity: Transfers: Patient performed stand pivot recliner to TIS w/c using hemi-walker with mod-min A after 1 failed attempt to stand due to decreased forward weight shift. Patient reported mild "wooziness" with transfer. Patient performed sit to/from stand x5 progressing from min A-supervision using hemi-walker 3/5 trials. Provided verbal cues for scooting forward, forward weight shift, and hand placement to push up with L hand. Patient with "wooziness" x3 transfers, then denied symptoms for remaining transfers. Patient continent of bladder during toileting. Performed peri-care and lower body clothing management with supervision. Patient performed hand hygiene without cues with supervision for balance at the  sink following toileting.   Gait Training:  Patient ambulated 8-20 feet within the room x3 using hemi-walker on first trial then HHA, then no AD with CGA-min A throughout. Ambulated with decreased R foot clearance, increased R knee flexion in stance, forward flexed posture, decreased gait speed, step length and height, decreased R weight shift, and intermittent step-to gait pattern leading with R. Provided verbal cues for erect posture, increased hamstring activation in pre-swing for improved R foot clearance, increased knee extension and lateral hip stability in R stance, and increased step length.  Wheelchair Mobility:  Patient was transported in the [place] w/c with total A to/from the main therapy gym during session for energy conservation and time management. Patient requesting to use the bathroom in the gym and returned to her room.   Patient in recliner with R upper extremity elevated on pillows and mats under he feet for improved positioning at end of session with breaks locked, seat belt alarm set, and all needs within reach. RN asking about need for Telesitter, per discussion, patient has not exhibited impulsive behaviors and per RN was consistent with calling for assistance.    Therapy Documentation Precautions:  Precautions Precautions: Fall Required Braces or Orthoses: Sling Restrictions Weight Bearing Restrictions: Yes RUE Weight Bearing: Non weight bearing Other Position/Activity Restrictions: Sling with activity for comfort, no ROM restrictions   Therapy/Group: Individual Therapy  Giacomo Valone L Orpah Hausner PT, DPT  03/21/2020, 4:52 PM

## 2020-03-21 NOTE — Progress Notes (Signed)
Occupational Therapy Session Note  Patient Details  Name: Emily Rice MRN: [id number] Date of Birth: May 29, 1960  Today's Date: 03/21/2020 OT Individual Time: 1130-1205 OT Individual Time Calculation (min): 35 min    Short Term Goals: Week 1:  OT Short Term Goal 1 (Week 1): Pt will maintain arousal to task with no more than min A OT Short Term Goal 1 - Progress (Week 1): Met OT Short Term Goal 2 (Week 1): Pt will complete UB bathing with compensatory strategies with min A OT Short Term Goal 2 - Progress (Week 1): Met OT Short Term Goal 3 (Week 1): Pt will don pants with mod A OT Short Term Goal 3 - Progress (Week 1): Met Week 2:  OT Short Term Goal 1 (Week 2): Pt will consistently transfer to BSC/toilet with MIN A and LRAD OT Short Term Goal 2 (Week 2): Pt will don shirt with MIN A OT Short Term Goal 3 (Week 2): Pt will recall hemi techniques wiht MIN VC OT Short Term Goal 4 (Week 2): Pt will tolerate being OOB consistently for 75% of sessions  Skilled Therapeutic Interventions/Progress Updates:     Patient in bed, alert and agrees to participate.  She is cooperative t/o session with appropriate questions and behavior.  Supine to sitting edge of bed on left side with min A.  SPT with hemi walker min A and increased time.  Max A to [employer].  Completed right hand/wrist and forearm AAROM - reviewed right humeral injury and precautions with good carryover.  Short distance ambulation and transfer w/c to recliner with min A.  Seat belt alarm set and call bell in hand, telesitter in place at close of session.    Therapy Documentation Precautions:  Precautions Precautions: Fall Required Braces or Orthoses: Sling Restrictions Weight Bearing Restrictions: Yes RUE Weight Bearing: Non weight bearing Other Position/Activity Restrictions: Sling with activity for comfort, no ROM restrictions   Therapy/Group: Individual Therapy  Carlos Levering 03/21/2020, 7:42 AM
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
## 2020-03-21 NOTE — Progress Notes (Signed)
Pt continues to adhere to safety precautions. Pt is calm and resting with call light in reach. No complications noted.  Sheela Stack, LPN

## 2020-03-21 NOTE — Progress Notes (Signed)
Occupational Therapy Session Note  Patient Details  Name: Emily Rice MRN: [id number] Date of Birth: 1960-07-17  Today's Date: 03/21/2020 OT Individual Time: 8937-3428 OT Individual Time Calculation (min): 70 min    Short Term Goals: Week 2:  OT Short Term Goal 1 (Week 2): Pt will consistently transfer to BSC/toilet with MIN A and LRAD OT Short Term Goal 2 (Week 2): Pt will don shirt with MIN A OT Short Term Goal 3 (Week 2): Pt will recall hemi techniques wiht MIN VC OT Short Term Goal 4 (Week 2): Pt will tolerate being OOB consistently for 75% of sessions  Skilled Therapeutic Interventions/Progress Updates:    Pt received supine with 5/10 pain in her L hip but agreeable to OT session. Pt completed bed mobility with close supervision to EOB. BP EOB: 105/73 with thigh high teds on. Pt stood with hemi walker for LUE support with min A. She completed ambulatory transfer into the bathroom with hemi walker and min A overall. Min cueing for hemi walker management/positioning. Pt voided urine and completed toileting tasks with min A overall. BP assessed again as pt was requesting to take a shower- 110/70. Pt agreeable to shower with teds still on d/t BP concerns. Pt transferred into shower with min A, using shower chair with back. She completed UB bathing with min HOH A for reaching under her LUE. Pt stood to wash peri areas with min A for standing balance support. Pt dryed off and completed stand pivot transfer to the w/c with min A. She completed oral care at the sink with set up assist. Pt donned shirt with min A. Pants donned with mod A, 2/2 from time constraints. New thigh high teds donned total A. Pt was left sitting up in the w/c with chair alarm belt set, all needs met.   Therapy Documentation Precautions:  Precautions Precautions: Fall Required Braces or Orthoses: Sling Restrictions Weight Bearing Restrictions: Yes RUE Weight Bearing: Non weight bearing Other Position/Activity  Restrictions: Sling with activity for comfort, no ROM restrictions  Therapy/Group: Individual Therapy  Curtis Sites 03/21/2020, 6:23 AM

## 2020-03-21 NOTE — Progress Notes (Signed)
Telesitter D/C and removed from pt room. Pt is not impulsive, calls for help and adheres to safety precautions.  Emily Stack, LPN

## 2020-03-22 ENCOUNTER — Inpatient Hospital Stay (HOSPITAL_COMMUNITY): Payer: Medicare HMO

## 2020-03-22 ENCOUNTER — Inpatient Hospital Stay (HOSPITAL_COMMUNITY): Payer: Medicare HMO | Admitting: Occupational Therapy

## 2020-03-22 MED ORDER — BOOST / RESOURCE BREEZE PO LIQD CUSTOM
1.0000 | Freq: Two times a day (BID) | ORAL | Status: DC
  Administered 2020-03-22 – 2020-03-28 (×13): 1 via ORAL
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
## 2020-03-22 NOTE — Progress Notes (Signed)
No staples noted to right forearm at this time. Audie Clear, LPN

## 2020-03-22 NOTE — Progress Notes (Signed)
Occupational Therapy Session Note  Patient Details  Name: Emily Rice MRN: [id number] Date of Birth: [birth year]  Today's Date: 03/22/2020 OT Individual Time: 1300-1415 OT Individual Time Calculation (min): 75 min    Short Term Goals: Week 2:  OT Short Term Goal 1 (Week 2): Pt will consistently transfer to BSC/toilet with MIN A and LRAD OT Short Term Goal 2 (Week 2): Pt will don shirt with MIN A OT Short Term Goal 3 (Week 2): Pt will recall hemi techniques wiht MIN VC OT Short Term Goal 4 (Week 2): Pt will tolerate being OOB consistently for 75% of sessions  Skilled Therapeutic Interventions/Progress Updates:    Patient in bed, alert and ready for therapy - she notes improved pain.   Reviewed hamstring stretch bed and w/c level.  Supine to sitting edge of bed with CGA - min cues for NWB right UE.  Sit to stand and ambulation without AD to/from bed, w/c with CGA.  She is able to donn c-style jacket with mod A.  Max A to donn and tie sneakers.  Completed scapular mobility activities, right shoulder mobility activities - attempted pendulums but unable to tolerate due to strain on hamstring - she was able to do towel slides and utilized arm "ranger" to [employer] and increase AAROM.  Completed elbow ROM in stance, various activities for wrist and hand seated and standing positions - continues with wrist extension impairment and digit extension to neutral.  She is pleasant, cooperative and demonstrates appropriate behavior t/o session.   Ambulation to recliner at close of session, seat belt alarm set and call bell in hand.    Therapy Documentation Precautions:  Precautions Precautions: Fall Required Braces or Orthoses: Sling Restrictions Weight Bearing Restrictions: Yes RUE Weight Bearing: Non weight bearing Other Position/Activity Restrictions: Sling with activity for comfort, no ROM restrictions   Therapy/Group: Individual Therapy  Carlos Levering 03/22/2020, 7:43 AM

## 2020-03-22 NOTE — Progress Notes (Signed)
Physical Therapy Session Note  Patient Details  Name: Emily Rice MRN: [id number] Date of Birth: 25-Sep-1960  Today's Date: 03/22/2020 PT Individual Time: 0910-1005 PT Individual Time Calculation (min): 55 min   Short Term Goals: Week 2:  PT Short Term Goal 1 (Week 2): Patient will complete basic transfers with MinA consistently PT Short Term Goal 2 (Week 2): Patient will ambulate >10ft with LRAD and MinA PT Short Term Goal 3 (Week 2): Patient will tolerate sitting up in wc >1 hour outside of therapy  Skilled Therapeutic Interventions/Progress Updates:     Patient in recliner with MD in the room upon PT arrival. Patient alert and agreeable to PT session. Patient reported 5-6/10 L posterior thigh pain during session, RN provided pain medicine and muscle relaxer during session and MD aware. PT provided repositioning, rest breaks, and distraction as pain interventions throughout session.   Lower extremity assessment: Pain to palpation: ischial tuberosity 7/10, belly of lateral and medial hamstring muscle 4-5/10; denied pain to bony landmarks of the pelvis and femur, also to palpation of quadriceps, gluteals, and adductors SLR for hamstring flexibility assessment: R 78 deg with knee straight with firm end feel indicative of muscle stretch, L 55 deg with knee straight with empty end feel limited by pain Muscle strength testing: Grossly 5/5 on R throughout in sitting, grossly 5/5 on L throughout, except hip flexion limited by posterior thigh pain and knee flexion limited by posterior thigh pain  Applied Kinesiotape over L posterior thigh using mechanical support technique with the aim of reducing pain and stretch with mobility. Applied Y shape tape with hamstring in stretch in side-lying, applied distal (over medial and lateral hamstring tendons just above the knee) to proximal (over ischial tuberosity with ~25% stretch between anchor points. Prior to application, patient denied any history of skin  irritation or allergy to adhesive. Educated patient on purpose of kinesiotape placement and signs symptoms of allergic reaction or irritation. Cleaned patient's skin and applied test strip at beginning of session and removed at end of session without sings of skin irritation. Instructed patient that the tape can be left on up to 3 days and can be worn in the shower. Instructed to removed the tape if peeling off, signs of skin irritation arise, or it has been on for >3 days. Informed patient that the tape is best removed in the shower or with a wet wash cloth. Patient stated understanding. Rehab team informed about tape placement to assist with monitoring patient's response due to patient having cognitive deficits secondary to TBI.  Therapeutic Activity: Bed Mobility: Patient performed supine to/from sit with supervision with cues for maintain NWB on R upper extremity x1. She performed rolling R with increased time for R upper extremity managment and attempted rolling to prone, unable to tolerate to due increased pressure on R upper extremity and patient returned to side-lying immediately. Doffed/donned pants bed level with total A for time management for tape application, see above. Performed bridging with cues x2 for lower body clothing management at bed level.  Transfers: Patient performed stand pivot x1 and sit to/from stand x1 with CGA-close supervision without AD. Provided verbal cues for forward weight shift and maintaining NWB on R upper extremity x2.  Gait Training:  Patient ambulated ~8 feet around the bed without an AD with CGA for safety balance and increased time. Ambulated with decreased R foot clearance, increased R knee flexion in stance, significantly decreased gait speed, and step-to progressing to step-to gait pattern with cues.  Provided verbal cues for erect posture, increased step length to promote step-through gait pattern, and increased L weight shift for improved R foot clearance.  Patient reported improved L hamstring pain with ambulation after kinesio tape applied, compared to stand pivot performed prior to tape application.   Patient in bed with K-pad placed under her L thigh at end of session with breaks locked, bed alarm set, and all needs within reach.   *Followed up with patient later during OT session, patient denied symptoms of irritation and skin with no sings of adverse reaction to kinesio tape. Patient reports reduced pain since tape was applied. Will continue to monitor patient response.    Therapy Documentation Precautions:  Precautions Precautions: Fall Required Braces or Orthoses: Sling Restrictions Weight Bearing Restrictions: Yes RUE Weight Bearing: Non weight bearing Other Position/Activity Restrictions: Sling with activity for comfort, no ROM restrictions   Therapy/Group: Individual Therapy  Maryfer Tauzin L Aviv Rota PT, DPT  03/22/2020, 12:49 PM

## 2020-03-22 NOTE — Progress Notes (Signed)
Patient ID: Alease Fait, female   DOB: 02/14/1961, 60 y.o.   MRN: [id number]   SW met with pt in room and called pt husband Hinda Kehr [phone]) to provide updates from team conference with goals of supervision level of care needed at d/c. Fam edu scheduled for 1:30pm-3:30pm on the following days: Sunday 1/16, Wednesday 1/19,a dn Sunday 1/23. Sw to continue to provide updates.   Loralee Pacas, MSW, [place] Office: [phone] Cell: [phone] Fax: [phone]

## 2020-03-22 NOTE — Progress Notes (Signed)
Ordered abdominal binder via Kellie Simmering. Awaiting binder.

## 2020-03-22 NOTE — Progress Notes (Signed)
Occupational Therapy Session Note  Patient Details  Name: Emily Rice MRN: [id number] Date of Birth: [birth year]  Today's Date: 03/22/2020 OT Individual Time: 2035-5974 OT Individual Time Calculation (min): 68 min    Short Term Goals: Week 2:  OT Short Term Goal 1 (Week 2): Pt will consistently transfer to BSC/toilet with MIN A and LRAD OT Short Term Goal 2 (Week 2): Pt will don shirt with MIN A OT Short Term Goal 3 (Week 2): Pt will recall hemi techniques wiht MIN VC OT Short Term Goal 4 (Week 2): Pt will tolerate being OOB consistently for 75% of sessions  Skilled Therapeutic Interventions/Progress Updates:    Pt received supine, 3/10 pain in her L hip, shower to be used as pain intervention. Pt agreeable to session. Discussed yesterdays session, comfort with sitting up, and equipment planning for home. Shower chair is recommended for use 2/2 balance and weightbearing restrictions. Pt completed bed mobility with supervision to EOB. She used hemi walker to complete ambulatory transfer into the bathroom with min A. Pt did have one LOB initially that required mod A for stabilization. Dizziness present during mobility. BP obtained on toilet- 113/75 without any support garments on! Pt voided urine and completed toileting tasks with min A overall. She required mod A to doff LB clothing. Pt transferred into shower and completed UB bathing with min cueing for compensatory methods. Min A for standing balance during LB bathing. Pt transferred back to w/c and donned shirt with mod A. Underwear and pants donned with min A. Oral care with set up assist. Thigh high teds donned total A with pt seated for BP support. Pt completed stand pivot transfer to the recliner with min A. She was left sitting up with all needs met, chair alarm set.   Therapy Documentation Precautions:  Precautions Precautions: Fall Required Braces or Orthoses: Sling Restrictions Weight Bearing Restrictions: Yes RUE Weight Bearing:  Non weight bearing Other Position/Activity Restrictions: Sling with activity for comfort, no ROM restrictions  Therapy/Group: Individual Therapy  Curtis Sites 03/22/2020, 6:39 AM
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
## 2020-03-23 ENCOUNTER — Inpatient Hospital Stay (HOSPITAL_COMMUNITY): Payer: Medicare HMO

## 2020-03-23 ENCOUNTER — Inpatient Hospital Stay (HOSPITAL_COMMUNITY): Payer: Medicare HMO | Admitting: Occupational Therapy

## 2020-03-23 ENCOUNTER — Inpatient Hospital Stay (HOSPITAL_COMMUNITY): Payer: Medicare HMO | Admitting: Speech Pathology

## 2020-03-23 NOTE — Progress Notes (Signed)
Physical Therapy Weekly Progress Note  Patient Details  Name: Emily Rice MRN: [id number] Date of Birth: 1960-11-29  Beginning of progress report period: March 16, 2020 End of progress report period: March 23, 2020  Today's Date: 03/23/2020 PT Individual Time: 1420-1530 PT Individual Time Calculation (min): 70 min   Patient has met 3 of 3 short term goals.  Patient with steady progress this week, limited by orthostasis and L hamstring pain. She currently requires supervision with bed mobility, CGA for transfers, CGA-min A gait >650 feet without AD.   Patient continues to demonstrate the following deficits muscle weakness, decreased cardiorespiratoy endurance, decreased coordination and decreased motor planning, decreased attention, decreased awareness, decreased problem solving and decreased memory and decreased sitting balance, decreased standing balance, decreased postural control, decreased balance strategies and difficulty maintaining precautions and therefore will continue to benefit from skilled PT intervention to increase functional independence with mobility.  Patient progressing toward long term goals..  Continue plan of care.  PT Short Term Goals Week 2:  PT Short Term Goal 1 (Week 2): Patient will complete basic transfers with MinA consistently PT Short Term Goal 1 - Progress (Week 2): Met PT Short Term Goal 2 (Week 2): Patient will ambulate >8f with LRAD and MinA PT Short Term Goal 2 - Progress (Week 2): Met PT Short Term Goal 3 (Week 2): Patient will tolerate sitting up in wc >1 hour outside of therapy PT Short Term Goal 3 - Progress (Week 2): Met Week 3:  PT Short Term Goal 1 (Week 3): Patient will perform bed mobility with supervision with consistent adhearance to R UE NWB without cues. PT Short Term Goal 2 (Week 3): Paient will perform transfers with CGA consistently. PT Short Term Goal 3 (Week 3): Patient will ambulate >200 feet with CGA without increased assist for  LOB and demonstraing good safety awareness. PT Short Term Goal 4 (Week 3): Patient will ambulated up/down 4 steps using 1 rail with min A.  Skilled Therapeutic Interventions/Progress Updates:     Patient in bed with B TED hose donned upon PT arrival. Patient alert and agreeable to PT session. Patient reported 5-6/10 L hamstring pain during session, RN made aware. PT provided repositioning, rest breaks, and distraction as pain interventions throughout session.   Therapeutic Activity: Bed Mobility: Patient performed supine to sit with min A and increased time coming to R EOB, assist due to NWB in R arm. Provided verbal cues for maintaining weight bearing precautions and technique. Discussed switching sides of the bed at home for increased use of L upper extremity with bed mobility and reduced assist, patient stated this could be done at d/c. Donned R upper extremity sling and tennis shoes with total A with patient EOB for energy/time management. Transfers: Patient performed sit to/from stand x6 with min A on first trial and CGA-close supervision on all other trials. Provided verbal cues for scooting forward and forward weight shift to initiate stand.  Gait Training:  Patient ambulated multiple trials focused on increased endurance, improved balance with gait, and improved gait mechanics. Provided verbal cues and demonstration for increased gait speed, step length and height, increased L arm swing and trunk rotation, erect posture and visual scanning of environment with significant improvement of gait speed and balance each trial.  Trial 1: 75 feet in 2 min 30 sec limited by fatigue using HHA with CGA-min A for balance. Vitals before: BP 106/69, HR 61, after: BP 100/67, HR 70 Trial 2: 200 feet without AD with  CGA and min A x3 for R LOB Tiral 3: 200 feet without AD with CGA with 1 LOB without additional assist Trial 4: 656 ft in 6 min, 4/10 modified RPE after, HR 86 Patient denied symptoms of  orthostasis throughout gait training this session.   Patient very pleased with her improved mobility during session. Did spontaneously stand x2 during session and ambulate without assistance to grab her jacket and attempt to find her nurse. Required redirection on safety concerns with patient getting up on her own and asking for assistance using the call light. Patient stated understanding and expressed that she "has been having trouble with her thinking." Reports increased challenge during speech therapy earlier today. Provided brain injury education, patient with appropriate questions about TBI and R UE recovery. All questions were answered to the best of therapist's ability.   Patient in recliner seated on roho cushion for reduced hamstring pain at end of session with breaks locked, seat belt alarm set, and all needs within reach.    Therapy Documentation Precautions:  Precautions Precautions: Fall Required Braces or Orthoses: Sling Restrictions Weight Bearing Restrictions: Yes RUE Weight Bearing: Non weight bearing Other Position/Activity Restrictions: Sling with activity for comfort, no ROM restrictions  Therapy/Group: Individual Therapy  Aloura Matsuoka L Emmilynn Marut PT, DPT  03/23/2020, 5:22 PM

## 2020-03-23 NOTE — Progress Notes (Signed)
Occupational Therapy Session Note  Patient Details  Name: Emily Rice MRN: [id number] Date of Birth: Oct 09, 1960  Today's Date: 03/23/2020 OT Individual Time: 4961-1643 OT Individual Time Calculation (min): 67 min    Short Term Goals: Week 1:  OT Short Term Goal 1 (Week 1): Pt will maintain arousal to task with no more than min A OT Short Term Goal 1 - Progress (Week 1): Met OT Short Term Goal 2 (Week 1): Pt will complete UB bathing with compensatory strategies with min A OT Short Term Goal 2 - Progress (Week 1): Met OT Short Term Goal 3 (Week 1): Pt will don pants with mod A OT Short Term Goal 3 - Progress (Week 1): Met Week 2:  OT Short Term Goal 1 (Week 2): Pt will consistently transfer to BSC/toilet with MIN A and LRAD OT Short Term Goal 2 (Week 2): Pt will don shirt with MIN A OT Short Term Goal 3 (Week 2): Pt will recall hemi techniques wiht MIN VC OT Short Term Goal 4 (Week 2): Pt will tolerate being OOB consistently for 75% of sessions   Skilled Therapeutic Interventions/Progress Updates:    Pt received in bed and agreeable to therapy and taking a shower. Pt needed min A to sit to EOB to avoid pushing through R arm.  She was then able to rise to stand and ambulate to the bathroom with min A using hemiwalker (mod A to step up over threshold for balance to advance R leg).  Sat on toilet to undress with CGA and then stepped into shower with min A.  Bathed in sitting with 1 stand with CGA.  Transferred to wc to dress - set up with shirt, min LB except for donning TEDS.  Assisted pt with donning R wrist splint,  She sat in wc to complete grooming.  Transferred to recliner and supported her arm on pillows vs using the sling.    She participated very well and only c/o mild dizziness which she attributed to her pain medication.   Pt in recliner with belt alarm on and all needs met.    Therapy Documentation Precautions:  Precautions Precautions: Fall Required Braces or Orthoses:  Sling Restrictions Weight Bearing Restrictions: Yes RUE Weight Bearing: Non weight bearing Other Position/Activity Restrictions: Sling with activity for comfort, no ROM restrictions   Pain: ankle pain ,applied voltaren gel   ADL: ADL Eating: Set up Where Assessed-Eating: Bed level Grooming: Unable to assess Upper Body Bathing: Unable to assess Lower Body Bathing: Unable to assess Upper Body Dressing: Unable to assess Lower Body Dressing: Maximal assistance Where Assessed-Lower Body Dressing: Edge of bed Toileting: Maximal assistance Where Assessed-Toileting: Bed level Toilet Transfer: Unable to assess Tub/Shower Transfer Method: Unable to assess   Therapy/Group: Individual Therapy  Bennett 03/23/2020, 10:05 AM

## 2020-03-23 NOTE — Progress Notes (Signed)
Speech Language Pathology Weekly Progress and Session Note  Patient Details  Name: Emily Rice MRN: [id number] Date of Birth: 1960-11-12  Beginning of progress report period: March 16, 2020 End of progress report period: March 23, 2020  Today's Date: 03/23/2020 SLP Individual Time: 0725-0820 SLP Individual Time Calculation (min): 55 min  Short Term Goals: Week 2: SLP Short Term Goal 1 (Week 2): Patient will demonstrate functional problem solving for mildly complex tasks with Min verbal cues. SLP Short Term Goal 1 - Progress (Week 2): Met SLP Short Term Goal 2 (Week 2): Patient will recall new, daily information with use of memory compensatory strategies with Min verbal cues. SLP Short Term Goal 2 - Progress (Week 2): Met SLP Short Term Goal 3 (Week 2): Patient will demonstrate sustained attention to functional tasks for 45 minutes with Min verbal cues for redirection. SLP Short Term Goal 3 - Progress (Week 2): Met SLP Short Term Goal 4 (Week 2): Patient will self-monitor and correct errors during functional tasks with Min verbal cus for redirection. SLP Short Term Goal 4 - Progress (Week 2): Met    New Short Term Goals: Week 3: SLP Short Term Goal 1 (Week 3): Patient will demonstrate functional problem solving for mildly complex tasks with Supervision verbal cues. SLP Short Term Goal 2 (Week 3): Patient will recall new, daily information with use of memory compensatory strategies with Supervision verbal cues. SLP Short Term Goal 3 (Week 3): Patient will demonstrate selective attention to functional tasks for 30 minutes in a mildly distracting enviornment with Min verbal cues for redirection. SLP Short Term Goal 4 (Week 3): Patient will self-monitor and correct errors during functional tasks with Supervision verbal cus for redirection.  Weekly Progress Updates: Patient has made excellent gains and has met 4 of 4 STGs this reporting period. Currently, patient demonstrates behaviors  consistent with a Rancho Level VII and requires overall Min-Mod A verbal and visual cues to complete functional and familiar tasks safely in regards to problem solving, attention, awareness and recall. Patient and family education ongoing. Patient would benefit from continued skilled SLP intervention to maximize her cognitive functioning and overall functional independence prior to discharge.      Intensity: Minumum of 1-2 x/day, 30 to 90 minutes Frequency: 3 to 5 out of 7 days Duration/Length of Stay: 04/05/20 Treatment/Interventions: Cognitive remediation/compensation;Internal/external aids;Speech/Language facilitation;Environmental Environmental consultant;Therapeutic Activities;Functional tasks;Patient/family education   Daily Session  Skilled Therapeutic Interventions: Skilled treatment session focused on cognitive goals. Upon arrival, patient was awake and agreeable to transfer to the recliner. TED hoes applied prior to transfer. SLP facilitated session by providing extra time and overall Mod A verbal and visual cues for recall and functional problem solving during a mildly complex scheduling and money management task. At end of session, patient reporting hamstring pain in recliner. SLP asked patient if she would like to transfer back to the bed X 2 and she declined. However, when asked if patient needed anything prior to SLP leaving she reported she would like to get back into bed. Patient was transferred with Min A and when sitting EOB requested to get back to the recliner as SLP reported she could not leave her sitting on the edge. With encouragement, patient agreeable to stay in bed and left with blinds on and head in an upright position to encourage alertness and arousal. RN aware of pain. Patient left with alarm on and all needs within reach. Continue with current plan of care.  Pain Pain in left hamstring-patient repositioned, RN aware   Therapy/Group: Individual Therapy  Terese Heier,  Harmonee Tozer 03/23/2020, 6:43 AM

## 2020-03-24 ENCOUNTER — Inpatient Hospital Stay (HOSPITAL_COMMUNITY): Payer: Medicare HMO | Admitting: Speech Pathology

## 2020-03-24 ENCOUNTER — Inpatient Hospital Stay (HOSPITAL_COMMUNITY): Payer: Medicare HMO

## 2020-03-24 ENCOUNTER — Inpatient Hospital Stay (HOSPITAL_COMMUNITY): Payer: Medicare HMO | Admitting: Occupational Therapy

## 2020-03-24 ENCOUNTER — Inpatient Hospital Stay (HOSPITAL_COMMUNITY): Payer: Medicare HMO | Admitting: Physical Therapy
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
## 2020-03-24 NOTE — Progress Notes (Signed)
Physical Therapy Session Note  Patient Details  Name: Emily Rice MRN: [id number] Date of Birth: November 19, 1960  Today's Date: 03/24/2020 PT Individual Time: 8101-7510 PT Individual Time Calculation (min): 45 min   Short Term Goals: Week 3:  PT Short Term Goal 1 (Week 3): Patient will perform bed mobility with supervision with consistent adhearance to R UE NWB without cues. PT Short Term Goal 2 (Week 3): Paient will perform transfers with CGA consistently. PT Short Term Goal 3 (Week 3): Patient will ambulate >200 feet with CGA without increased assist for LOB and demonstraing good safety awareness. PT Short Term Goal 4 (Week 3): Patient will ambulated up/down 4 steps using 1 rail with min A.  Skilled Therapeutic Interventions/Progress Updates:   Patient received sitting up in bed, agreeable to PT. She denies pain at this time. PT donning thigh-high TED hose with MaxA at bedlevel. She was able to come sit edge of bed with CGA and verbal cues to maintain NWB to RUE. She was able to don new short with MinA and pants with ModA completed in standing. She required MInA to come to standing. She was able to transfer to wc via stand pivot with MinA. PT propelling patient to mock apartment for time management. She was able to ambulate through kitchen with CGA to complete scavenger hunt finding ingredients to complete cookie recipe. Patient with onset of "dizziness" when ambulating back to wc requiring up to MinA to safely return to chair. Vitals cart not immediately available to assess BP. Patient reporting feeling light headed, but was able to continue conversing with PT until it passed ~2 mins later. Patient returning to room in wc, transferring to recliner via stand pivot with CGA. Seatbelt alarm on, call light within reach.    Therapy Documentation Precautions:  Precautions Precautions: Fall Required Braces or Orthoses: Sling Restrictions Weight Bearing Restrictions: Yes RUE Weight Bearing: Non  weight bearing Other Position/Activity Restrictions: Sling with activity for comfort, no ROM restrictions    Therapy/Group: Individual Therapy  Karoline Caldwell, PT, DPT, CBIS  03/24/2020, 7:36 AM

## 2020-03-24 NOTE — Progress Notes (Signed)
Occupational Therapy Weekly Progress Note  Patient Details  Name: Emily Rice MRN: [id number] Date of Birth: 08-13-1960  Beginning of progress report period: March 16, 2020 End of progress report period: March 24, 2020  Patient has met 3 of 4 short term goals.  Pt is making good progress this reporting period improving OOB tolerance, tolerance to upright and functional moiblity this weel. Pt use accessing bathroom at ambulatory level with MIN A and demo improved tolerance to standing with support garments. Pt reqires min-mod A for dressing, MIN A for bathing and requires VC for management of RUE. Pt would benefit from continued skilled OT to improve independence and safety with BADLs and decrease BOC.  Patient continues to demonstrate the following deficits: muscle weakness, decreased cardiorespiratoy endurance, impaired timing and sequencing, unbalanced muscle activation, motor apraxia and decreased coordination, decreased initiation, decreased attention, decreased awareness, decreased problem solving, decreased safety awareness and decreased memory and decreased sitting balance, decreased standing balance, decreased postural control, decreased balance strategies and difficulty maintaining precautions and therefore will continue to benefit from skilled OT intervention to enhance overall performance with BADL, iADL and Reduce care partner burden.  Patient progressing toward long term goals..  Continue plan of care.  OT Short Term Goals Week 2:  OT Short Term Goal 1 (Week 2): Pt will consistently transfer to BSC/toilet with MIN A and LRAD OT Short Term Goal 1 - Progress (Week 2): Met OT Short Term Goal 2 (Week 2): Pt will don shirt with MIN A OT Short Term Goal 2 - Progress (Week 2): Met OT Short Term Goal 3 (Week 2): Pt will recall hemi techniques wiht MIN VC OT Short Term Goal 3 - Progress (Week 2): Progressing toward goal OT Short Term Goal 4 (Week 2): Pt will tolerate being OOB  consistently for 75% of sessions OT Short Term Goal 4 - Progress (Week 2): Met Week 3:  OT Short Term Goal 1 (Week 3): Pt will recall hemi dressing strategies with MIN VC OT Short Term Goal 2 (Week 3): Pt will transfer into shower wiht MIN A and LRAD consistently OT Short Term Goal 3 (Week 3): Pt will don footwear with MIN A and AE PRN    Tonny Branch 03/24/2020, 6:35 AM

## 2020-03-24 NOTE — Progress Notes (Signed)
Speech Language Pathology Daily Session Note  Patient Details  Name: Emily Rice MRN: [id number] Date of Birth: 1960/09/02  Today's Date: 03/24/2020 SLP Individual Time: 2585-2778 SLP Individual Time Calculation (min): 38 min  Short Term Goals: Week 3: SLP Short Term Goal 1 (Week 3): Patient will demonstrate functional problem solving for mildly complex tasks with Supervision verbal cues. SLP Short Term Goal 2 (Week 3): Patient will recall new, daily information with use of memory compensatory strategies with Supervision verbal cues. SLP Short Term Goal 3 (Week 3): Patient will demonstrate selective attention to functional tasks for 30 minutes in a mildly distracting enviornment with Min verbal cues for redirection. SLP Short Term Goal 4 (Week 3): Patient will self-monitor and correct errors during functional tasks with Supervision verbal cus for redirection.  Skilled Therapeutic Interventions: Skilled treatment session focused on cognitive goals. Upon arrival, patient appeared lethargic while upright in the recliner. Patient's call light was on and she reported she wanted to get back into the bed, however, agreeable to staying up for session. SLP facilitated session by providing extra time and overall Min A verbal cues for complex problem solving during a functional reasoning and money management task. Patient demonstrated selective attention to task for ~25 minutes with supervision level verbal cues for redirection. Patient transferred back to bed at end of session and left with alarm on and all needs within reach. Patient asked if she could get up and walk around the room by herself, Mod verbal cues were needed for emergent awareness. Continue with current plan of care.      Pain No/Denies Pain   Therapy/Group: Individual Therapy  Izic Stfort, [place] 03/24/2020, 3:14 PM

## 2020-03-24 NOTE — Progress Notes (Signed)
Physical Therapy Session Note  Patient Details  Name: Emily Rice MRN: [id number] Date of Birth: Nov 19, 1960  Today's Date: 03/24/2020 PT Individual Time: 1451-1529 PT Individual Time Calculation (min): 38 min   Short Term Goals: Week 3:  PT Short Term Goal 1 (Week 3): Patient will perform bed mobility with supervision with consistent adhearance to R UE NWB without cues. PT Short Term Goal 2 (Week 3): Paient will perform transfers with CGA consistently. PT Short Term Goal 3 (Week 3): Patient will ambulate >200 feet with CGA without increased assist for LOB and demonstraing good safety awareness. PT Short Term Goal 4 (Week 3): Patient will ambulated up/down 4 steps using 1 rail with min A.  Skilled Therapeutic Interventions/Progress Updates:    Pt received supine in bed and agreeable to therapy session. Pt continues to demonstrate delayed processing with impaired memory and impaired word finding requiring increased time to verbally communicate her thoughts. Pt wearing B LE thigh high TED hose but no abdominal binder found. Pt wearing cock-up splint on R wrist during session and therapist reinforced NWBing precautions. Supine>sitting L EOB, HOB flat and not using bedrails, with min assist for trunk upright. Pt denies symptoms of orthostatic hypotension. Donned shoes max assist. Reports need to use bathroom. Sit<>stands, no AD, with CGA for steadying throughout session. Gait training ~39ft in bathroom, no AD, with CGA/min assist for steadying/balance with pt frequently reaching for items in environment for support with L UE. Standing with CGA performed LB clothing management without assist - continent of bladder and performed seated peri-care without assist. Sit<>stands to/from toilet with pt using L UE support on grab bar for increased stability and to assist with raising/lowering - CGA for steadying. Gait ~35ft to sink, no AD, with CGA/min assist for steadying to complete hand hygiene. Assessed  vitals: BP 114/68 (MAP 83), HR 52bpm. Pt able to recall that she had a "concusion" but unable to recall that she had a TBI stating "the doctor said I had a....." but unable to recall the words/name. Pt reports she is still experiencing times where she will think her husband is with her and she will start talking to him but pt states she is now able to look around and orient herself to the fact that he is not present - therapist educated pt on TBI recovery and that this is an improvement from last time this therapist saw pt because before she would rely on calling her husband to have him tell her they didn't have those conversations but now she is able to in the moment look around and orient herself. Gait training ~218ft x2 to/from main therapy gym, no AD, with CGA/min assist for balance - demos slow gait speed, narrow BOS, decreased B LE step lengths, and min increased postural sway, and decreased trunk rotation - during gait performed dual-task and recall/memory challenges to: remember her room number (no cues needed for this), locate the [place] [facility] of the [facility] (required cuing to look in all directions to locate hospital signs) and then navigate back to room while carrying on a conversation regarding where she used to live and work (able to pause conversation briefly to ensure navigating in correct direction and then restart conversation with min cuing to reorient to conversation details). Assessed vitals after 1st walk: BP 110/68 (MAP 81), HR 55bpm. Pt agreeable to remain sitting up in recliner - left with needs in reach and seat belt alarm on.  Therapy Documentation Precautions:  Precautions Precautions: Fall Required Braces or  Orthoses: Sling Restrictions Weight Bearing Restrictions: Yes RUE Weight Bearing: Non weight bearing Other Position/Activity Restrictions: Sling with activity for comfort, no ROM restrictions  Pain: Reports she has been having pain/discomfort in L hamstring muscle but  no specific complaints of pain during session.   Therapy/Group: Individual Therapy  Tawana Scale , PT, DPT, CSRS  03/24/2020, 3:58 PM

## 2020-03-24 NOTE — Progress Notes (Signed)
Occupational Therapy Session Note  Patient Details  Name: Emily Rice MRN: [id number] Date of Birth: 1961/02/15  Today's Date: 03/24/2020 OT Individual Time: 1100-1200 OT Individual Time Calculation (min): 60 min    Short Term Goals: Week 3:  OT Short Term Goal 1 (Week 3): Pt will recall hemi dressing strategies with MIN VC OT Short Term Goal 2 (Week 3): Pt will transfer into shower wiht MIN A and LRAD consistently OT Short Term Goal 3 (Week 3): Pt will don footwear with MIN A and AE PRN  Skilled Therapeutic Interventions/Progress Updates:    Patient seated in recliner.  She notes stiffness t/o right arm.  Reviewed precautions with min cues to avoid weight bearing through right arm with transfers.  She is able to recall memories from her recent past but states that she feels like it takes too long to retrieve.  She is able to donn c-style jacket with min A.  Sit to stand and ambulation without AD to/from recliner and therapy gym chair without arms CGA/min A to steady at times.  Completed scapular mobility, shoulder mobilization with large therapy ball as support and modified pendulums, elbow/wrist/hand AROM/AAROM.  She notes less stiffness and pain following activity.   She is able to tolerate elbow and shoulder extension activities in standing position.  She returned to the recliner at close of session, seat belt alarm set and call bell in hand.    Therapy Documentation Precautions:  Precautions Precautions: Fall Required Braces or Orthoses: Sling Restrictions Weight Bearing Restrictions: Yes RUE Weight Bearing: Non weight bearing Other Position/Activity Restrictions: Sling with activity for comfort, no ROM restrictions  Therapy/Group: Individual Therapy  Carlos Levering 03/24/2020, 7:35 AM

## 2020-03-25 ENCOUNTER — Inpatient Hospital Stay (HOSPITAL_COMMUNITY): Payer: Medicare HMO

## 2020-03-25 ENCOUNTER — Inpatient Hospital Stay (HOSPITAL_COMMUNITY): Payer: Medicare HMO | Admitting: Physical Therapy

## 2020-03-25 LAB — BASIC METABOLIC PANEL
Anion gap: 11 (ref 5–15)
BUN: 10 mg/dL (ref 6–20)
CO2: 25 mmol/L (ref 22–32)
Calcium: 8.8 mg/dL — ABNORMAL LOW (ref 8.9–10.3)
Chloride: 106 mmol/L (ref 98–111)
Creatinine, Ser: 0.83 mg/dL (ref 0.44–1.00)
GFR, Estimated: >60 mL/min (ref >60)
Glucose, Bld: 62 mg/dL — ABNORMAL LOW (ref 70–99)
Potassium: 3.8 mmol/L (ref 3.5–5.1)
Sodium: 142 mmol/L (ref 135–145)

## 2020-03-25 LAB — CBC
HCT: 33 % — ABNORMAL LOW (ref 36.0–46.0)
Hemoglobin: 10.2 g/dL — ABNORMAL LOW (ref 12.0–15.0)
MCH: 27.6 pg (ref 26.0–34.0)
MCHC: 30.9 g/dL (ref 30.0–36.0)
MCV: 89.2 fL (ref 80.0–100.0)
Platelets: 225 10*3/uL (ref 150–400)
RBC: 3.7 MIL/uL — ABNORMAL LOW (ref 3.87–5.11)
RDW: 18.2 % — ABNORMAL HIGH (ref 11.5–15.5)
WBC: 5.2 10*3/uL (ref 4.0–10.5)
nRBC: 0 % (ref 0.0–0.2)

## 2020-03-25 NOTE — Progress Notes (Signed)
Occupational Therapy Session Note  Patient Details  Name: Emily Rice MRN: [id number] Date of Birth: Nov 12, 1960  Today's Date: 03/25/2020 OT Individual Time: 4854-6270 OT Individual Time Calculation (min): 60 min    Short Term Goals: Week 3:  OT Short Term Goal 1 (Week 3): Pt will recall hemi dressing strategies with MIN VC OT Short Term Goal 2 (Week 3): Pt will transfer into shower wiht MIN A and LRAD consistently OT Short Term Goal 3 (Week 3): Pt will don footwear with MIN A and AE PRN  Skilled Therapeutic Interventions/Progress Updates:    1:1. Pt received in bed agreeable to OT. Pt wit pian in L ankle. Pt completes donning C shaped jacket with MIN A (declines zipping up). Pt completes ambulation into tx gym with CGA-MIN A for minor LOB and occasional scissoring. OT installs elastic laces and pt able to don shoes with S. Pt competes standing pendulum exercises with VC for technique at high low table. Pt requires playing card on the floor for visual fixation with improvements in dizziness. Pt completes mobility back to room as stated above. Exited session with pt seated in bed, exit alarm on and call light in reach      Therapy Documentation Precautions:  Precautions Precautions: Fall Required Braces or Orthoses: Sling Restrictions Weight Bearing Restrictions: Yes RUE Weight Bearing: Non weight bearing Other Position/Activity Restrictions: Sling with activity for comfort, no ROM restrictions General:   Vital Signs: Therapy Vitals Temp: 98.4 F (36.9 C) Temp Source: Oral Pulse Rate: (!) 57 Resp: 16 BP: (!) 88/52 Patient Position (if appropriate): Lying Oxygen Therapy SpO2: 98 % O2 Device: Room Air Pain:   ADL: ADL Eating: Set up Where Assessed-Eating: Bed level Grooming: Unable to assess Upper Body Bathing: Unable to assess Lower Body Bathing: Unable to assess Upper Body Dressing: Unable to assess Lower Body Dressing: Maximal assistance Where Assessed-Lower  Body Dressing: Edge of bed Toileting: Maximal assistance Where Assessed-Toileting: Bed level Toilet Transfer: Unable to assess Tub/Shower Transfer Method: Unable to assess Vision   Perception    Praxis   Exercises:   Other Treatments:     Therapy/Group: Individual Therapy  Tonny Branch 03/25/2020, 6:43 AM
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
## 2020-03-25 NOTE — Progress Notes (Signed)
Physical Therapy Session Note  Patient Details  Name: Emily Rice MRN: [id number] Date of Birth: [birth year]  Today's Date: 03/25/2020 PT Individual Time: 1100-1144 PT Individual Time Calculation (min): 44 min missed 16 mins.  Short Term Goals: Week 1:  PT Short Term Goal 1 (Week 1): Patient will perform bed mobility with mod A consistently. PT Short Term Goal 1 - Progress (Week 1): Met PT Short Term Goal 2 (Week 1): Patient will perform basic transfer with mod A of 1 person. PT Short Term Goal 2 - Progress (Week 1): Met PT Short Term Goal 3 (Week 1): Patient will initiate gait training. PT Short Term Goal 3 - Progress (Week 1): Met Week 2:  PT Short Term Goal 1 (Week 2): Patient will complete basic transfers with MinA consistently PT Short Term Goal 1 - Progress (Week 2): Met PT Short Term Goal 2 (Week 2): Patient will ambulate >42f with LRAD and MinA PT Short Term Goal 2 - Progress (Week 2): Met PT Short Term Goal 3 (Week 2): Patient will tolerate sitting up in wc >1 hour outside of therapy PT Short Term Goal 3 - Progress (Week 2): Met Week 3:  PT Short Term Goal 1 (Week 3): Patient will perform bed mobility with supervision with consistent adhearance to R UE NWB without cues. PT Short Term Goal 2 (Week 3): Paient will perform transfers with CGA consistently. PT Short Term Goal 3 (Week 3): Patient will ambulate >200 feet with CGA without increased assist for LOB and demonstraing good safety awareness. PT Short Term Goal 4 (Week 3): Patient will ambulated up/down 4 steps using 1 rail with min A.  Skilled Therapeutic Interventions/Progress Updates:    pt received in bed asleep but awoke to name and agreeable to therapy. Pt directed in supine>sit EOB pt denied dizziness and had thigh high TED hose. Pt directed in Sit to stand without AD. Denied dizziness BP taken to be 112/72 and heart rate 78. Pt reported slightly dizzy feeling however relived after 1 min in standing. Pt directed in  gait training for 10' and demonstrated slight LOB posteriorly and reported feeling much more dizzy, PT directed pt to sit in WC and BP taken to be 96/67, after 1-2 mins in sitting pt reported she felt much better and agreeable to attempt gait training. Pt directed in Sit to stand, BP taken to be 121/75 and directed in gait training without AD, CGA with WC follow for safety. Pt completed 120' CGA reported she felt tired and wanted to sit down, then additional 120' and requested to return to room; VC for increased gait speed, trunk extension, midline standing posture and arm swing, slight R path deviation noted during gait with limited effect to correct with VC. Pt requested to return to bed, transferred CGA then requested to sit in recliner, recliner setup and pt directed in step transfer to recliner CGA, and setup for comfort. Pt left in recliner, alarm belt set, All needs in reach and in good condition. Call light in hand.  Pt denied to continue to participate in PT 2/2 hamstring pain and missed 16 mins of PT.  Therapy Documentation Precautions:  Precautions Precautions: Fall Required Braces or Orthoses: Sling Restrictions Weight Bearing Restrictions: Yes RUE Weight Bearing: Non weight bearing Other Position/Activity Restrictions: Sling with activity for comfort, no ROM restrictions General:   Vital Signs: Therapy Vitals Temp: 97.8 F (36.6 C) Temp Source: Oral Pulse Rate: 68 Resp: 17 BP: 106/62 Patient Position (if appropriate): Sitting Oxygen  Therapy SpO2: 100 % O2 Device: Room Air Pain: Pain Assessment Pain Score: 3  Mobility:   Locomotion :    Trunk/Postural Assessment :    Balance:   Exercises:   Other Treatments:      Therapy/Group: Individual Therapy  Emily Rice 03/25/2020, 1:55 PM

## 2020-03-25 NOTE — Plan of Care (Signed)
  Problem: RH Ambulation Goal: LTG Patient will ambulate in controlled environment (PT) Description: LTG: Patient will ambulate in a controlled environment, # of feet with assistance (PT). Flowsheets (Taken 03/25/2020 0721) LTG: Pt will ambulate in controlled environ  assist needed:: (Upgraded goal due to patient progress with mobility and activity tolerance) -- LTG: Ambulation distance in controlled environment: >850 feet using LRAD during 6 min walk test to meet MCID for improved cardiovascular endurance, balance, and gait speed Note: Upgraded goal due to patient progress with mobility and activity tolerance

## 2020-03-25 NOTE — Progress Notes (Signed)
Physical Therapy Session Note  Patient Details  Name: Emily Rice MRN: [id number] Date of Birth: [birth year]  Today's Date: 03/25/2020 PT Individual Time: 6226-3335 PT Individual Time Calculation (min): 59 min   Short Term Goals: Week 1:  PT Short Term Goal 1 (Week 1): Patient will perform bed mobility with mod A consistently. PT Short Term Goal 1 - Progress (Week 1): Met PT Short Term Goal 2 (Week 1): Patient will perform basic transfer with mod A of 1 person. PT Short Term Goal 2 - Progress (Week 1): Met PT Short Term Goal 3 (Week 1): Patient will initiate gait training. PT Short Term Goal 3 - Progress (Week 1): Met Week 2:  PT Short Term Goal 1 (Week 2): Patient will complete basic transfers with MinA consistently PT Short Term Goal 1 - Progress (Week 2): Met PT Short Term Goal 2 (Week 2): Patient will ambulate >21f with LRAD and MinA PT Short Term Goal 2 - Progress (Week 2): Met PT Short Term Goal 3 (Week 2): Patient will tolerate sitting up in wc >1 hour outside of therapy PT Short Term Goal 3 - Progress (Week 2): Met Week 3:  PT Short Term Goal 1 (Week 3): Patient will perform bed mobility with supervision with consistent adhearance to R UE NWB without cues. PT Short Term Goal 2 (Week 3): Paient will perform transfers with CGA consistently. PT Short Term Goal 3 (Week 3): Patient will ambulate >200 feet with CGA without increased assist for LOB and demonstraing good safety awareness. PT Short Term Goal 4 (Week 3): Patient will ambulated up/down 4 steps using 1 rail with min A.  Skilled Therapeutic Interventions/Progress Updates:    pt received in bed and agreeable to therapy. Pt directed in supine>sit CGA. New abdominal binder at bedside present, PT donned on pt, pt already had thigh high TEDs in place, max A to don B shoes. Pt directed in Sit to stand no AD CGA, denied dizziness initially but reported slight dizziness post 30-40s, BP taken to be 112/70 and with 1 min in standing  pt denied dizziness. Pt directed in gait training with WC follow for safety for 300' CGA one instance of min A with LOB posteriorly. Pt directed in NuStep for 5 mins L4 with single step cues needed to setup and to initiate activity and pt completed without rest breaks for improved BLE strength, tolerance to activity and attention to task. Pt denied pain, dizziness, shortness of breath during and post activity. Pt directed in x4 standing activities with problem solving activity for improved tolerance to activity in standing during duel tasking, pt required frequent cues to complete matching block pictures correctly, all level one pictures used however with cues pt able to identify mistakes and correct with extra time, RUE not used to remain in WB status. Pt directed in gait training to return to room, 150' CGA with one min A with LOB posteriorly, requested to return to bed, sit>supine CGA with VC for problem solving in room. Pt then reported she needed to use the restroom, abd binder, shoes max A to doff then re don at bedside. Pt directed in Sit to stand and gait to restroom CGA for safety and had BM and voided bladder, CGA for hygiene and min A for pants. Pt then directed in gait to sink to wash hands, CGA for stability, steps to bed, sit>supine CGA. Pt denied pain, reported comfort in bed, All needs in reach and in good condition. Call light in hand.  And alarm set.   Throughout session pt required cues for problem solving, demonstrated delayed processing and need of cues intermittently for corrections with tasks and to initiate.   Therapy Documentation Precautions:  Precautions Precautions: Fall Required Braces or Orthoses: Sling Restrictions Weight Bearing Restrictions: Yes RUE Weight Bearing: Non weight bearing Other Position/Activity Restrictions: Sling with activity for comfort, no ROM restrictions General:   Vital Signs: Therapy Vitals Temp: 97.8 F (36.6 C) Temp Source: Oral Pulse Rate:  68 Resp: 17 BP: 106/62 Patient Position (if appropriate): Sitting Oxygen Therapy SpO2: 100 % O2 Device: Room Air Pain:   Mobility:   Locomotion :    Trunk/Postural Assessment :    Balance:   Exercises:   Other Treatments:      Therapy/Group: Individual Therapy  Junie Panning 03/25/2020, 3:33 PM

## 2020-03-26 MED ORDER — TOPIRAMATE 25 MG PO TABS
25.0000 mg | ORAL_TABLET | Freq: Two times a day (BID) | ORAL | Status: DC
  Administered 2020-03-26 – 2020-04-05 (×20): 25 mg via ORAL
  Filled 2020-03-26 (×19): qty 1
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
## 2020-03-26 NOTE — Progress Notes (Signed)
Occupational Therapy Session Note  Patient Details  Name: Emily Rice MRN: [id number] Date of Birth: 28-Apr-1960  Today's Date: 03/26/2020 OT Individual Time: 1450-1530 OT Individual Time Calculation (min): 40 min    Short Term Goals: Week 3:  OT Short Term Goal 1 (Week 3): Pt will recall hemi dressing strategies with MIN VC OT Short Term Goal 2 (Week 3): Pt will transfer into shower wiht MIN A and LRAD consistently OT Short Term Goal 3 (Week 3): Pt will don footwear with MIN A and AE PRN  Skilled Therapeutic Interventions/Progress Updates:    Pt received supine, agreeable to extra session. Pt completed bed mobility to EOB with supervision. She used hemi walker to complete stand pivot transfer to the w/c with CGA. Min cueing for R UE NWB. Pt's husband arrived for session as well. Pt completed towel slides with her RUE with focus on activation of flexion and reduction of compensation via abducting and elevation. Pt required min facilitation overall. She then completed closed chain shoulder flexion with a medium sized unweighted ball. She required mod [facility] facilitation. Grasp strength and hand prehension activity completed with clothes pins. Lastly, flexion/ext of the hand via a washcloth with focus on extension and min facilitation provided. Pt left sitting up with all needs met. Husband present.   Therapy Documentation Precautions:  Precautions Precautions: Fall Required Braces or Orthoses: Sling Restrictions Weight Bearing Restrictions: Yes RUE Weight Bearing: Non weight bearing Other Position/Activity Restrictions: Sling with activity for comfort, no ROM restrictions  Therapy/Group: Individual Therapy  Curtis Sites 03/26/2020, 3:08 PM

## 2020-03-27 ENCOUNTER — Encounter (HOSPITAL_COMMUNITY): Payer: Medicare HMO | Admitting: Speech Pathology

## 2020-03-27 ENCOUNTER — Encounter (HOSPITAL_COMMUNITY): Payer: Medicare HMO

## 2020-03-27 ENCOUNTER — Ambulatory Visit (HOSPITAL_COMMUNITY): Payer: Medicare HMO

## 2020-03-27 NOTE — Progress Notes (Signed)
Speech Language Pathology Daily Session Note  Patient Details  Name: Emily Rice MRN: [id number] Date of Birth: November 21, 1960  Today's Date: 03/27/2020 SLP Individual Time: 1300-1330 SLP Individual Time Calculation (min): 30 min  Short Term Goals: Week 3: SLP Short Term Goal 1 (Week 3): Patient will demonstrate functional problem solving for mildly complex tasks with Supervision verbal cues. SLP Short Term Goal 2 (Week 3): Patient will recall new, daily information with use of memory compensatory strategies with Supervision verbal cues. SLP Short Term Goal 3 (Week 3): Patient will demonstrate selective attention to functional tasks for 30 minutes in a mildly distracting enviornment with Min verbal cues for redirection. SLP Short Term Goal 4 (Week 3): Patient will self-monitor and correct errors during functional tasks with Supervision verbal cus for redirection.  Skilled Therapeutic Interventions:   Pt was seen for skilled ST targeting cognitive goals.  Pt was awake but lethargic upon therapist's arrival.  She stated that she hadn't slept well the night before but was agreeable to participating in treatment.  When asked what pt's goals were for the remainder of her time on CIR, pt stated that her mentation was of concern to her, stating "I want my thinking to be as sharp as it was before."  SLP provided skilled education regarding memory compensatory strategies and provided a handout to facilitate carryover in the home environment.  SLP also updated pt's memory notebook and left it within pt's reach as it had been left on the windowsill in pt's room and hadn't been updated in ~6 days.  Pt was encouraged to have therapists write down information into notebook to help improve memory of daily information.  All questions were answered to pt's satisfaction at this time.  Pt was left in bed with bed alarm set and call bell within reach.  Continue per current plan of care.    Pain Pain Assessment Pain  Scale: 0-10 Pain Score: 0-No pain  Therapy/Group: Individual Therapy  Kermit Arnette, Selinda Orion 03/27/2020, 1:52 PM

## 2020-03-27 NOTE — Progress Notes (Signed)
Physical Therapy Session Note  Patient Details  Name: Emily Rice MRN: [id number] Date of Birth: 1960-10-03  Today's Date: 03/27/2020 PT Individual Time: 1445-1540 PT Individual Time Calculation (min): 55 min   Short Term Goals: Week 3:  PT Short Term Goal 1 (Week 3): Patient will perform bed mobility with supervision with consistent adhearance to R UE NWB without cues. PT Short Term Goal 2 (Week 3): Paient will perform transfers with CGA consistently. PT Short Term Goal 3 (Week 3): Patient will ambulate >200 feet with CGA without increased assist for LOB and demonstraing good safety awareness. PT Short Term Goal 4 (Week 3): Patient will ambulated up/down 4 steps using 1 rail with min A.  Skilled Therapeutic Interventions/Progress Updates:     Patient in bed upon PT arrival. Patient alert and agreeable to PT session. Patient denied pain during session.  Patient demonstrated intermittent confusion and increased internal and external distractibility throughout session resulting in mild impulsivity and poor attention to tasks during session.   Therapeutic Activity: Bed Mobility: Patient performed supine to sit with min A to the R, reminded patient to perform to the L for reduced assist due to increased leverage with her L arm to the L. Donned B thigh high TEDs, abdominal binder, and R upper extremity sling with total A for mobility during session. Applied dressing over skin abrasion over R upper trapezius during session. Patient donned shoes with set-up assist with elastic laces sitting EOB. Donned jacket for mobility out of the room with max A due to decreased functional use of R upper extremity. Provided cues for donning/doffing technique with R humerus fx.  Transfers: Patient performed sit to/from stand x6 and toilet transfer x1 with supervision without an AD throughout session. Provided verbal cues for safety awareness due to patient standing impulsively with therapist across the room x2.  Patient was continent of bowl during session. Performed peri-care and lower body dressing independently with supervision for balance safety.  Patient ambulated around the room to "tidy-up" x2 spontaneously during session. Would focus on several things at once attempting to pick up clutter around the room then became overwhelmed and required a sitting rest break both times. Assisted with cleaning patient's room for reduced external distraction. Informed RN of patient's impulsivity and perseveration on the room's tidiness leading to impulsivity during session.   Gait Training:  Patient ambulated >200 feet x3 without an AD with CGA for safety/balance and min A for minor LOB to the R x1. Ambulated with slow gait speed, narrow BOS, mild veering R, decreased step length and height R>L, and decreased L arm swing and trunk rotation. Provided verbal cues for increased gait speed, BOS, and L arm swing for improved balance with gait, and cued patient to attend to veering R and self correct when she became aware, patient able to self-correct 2/3 times without cues.  Patient ascended/descended 4-6" steps using L rail with CGA to ascend and min A to descend due to L leg buckling, improved when leading with L leg rather than R. Performed step-to gait pattern leading with L while ascending and L x2 and R x2 while descending. Provided cues and demonstration for technique and sequencing.   Patient in recliner in the room with her feet on a floor mat for improved positioning and sitting tolerance with R upper extremity elevated at end of session with breaks locked, seat belt alarm set, and all needs within reach.   Therapy Documentation Precautions:  Precautions Precautions: Fall Required Braces or Orthoses:  Sling Restrictions Weight Bearing Restrictions: Yes RUE Weight Bearing: Non weight bearing Other Position/Activity Restrictions: Sling with activity for comfort, no ROM restrictions   Therapy/Group: Individual  Therapy  Tin Engram L Telesha Deguzman PT, DPT  03/27/2020, 7:58 PM
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
## 2020-03-27 NOTE — Progress Notes (Signed)
Occupational Therapy Session Note  Patient Details  Name: Emily Rice MRN: [id number] Date of Birth: [birth year]  Today's Date: 03/27/2020 OT Individual Time: 1345-1425 OT Individual Time Calculation (min): 40 min    Short Term Goals: Week 3:  OT Short Term Goal 1 (Week 3): Pt will recall hemi dressing strategies with MIN VC OT Short Term Goal 2 (Week 3): Pt will transfer into shower wiht MIN A and LRAD consistently OT Short Term Goal 3 (Week 3): Pt will don footwear with MIN A and AE PRN  Skilled Therapeutic Interventions/Progress Updates:    Pt resting in bed upon arrival. Session originally scheduled for education but inclement weather prevented education. Reviewed earlier therapy with SLP and reviewed memory strategies handout. Resting hand splint and wrist cockup splint in bed beside pt. Reviewed use and purpose with patient. Pt could not recall purpose of splints. Assisted pt in donning wrist cockup splint and explained purpose. Pt recalled date when questioned and recognized that 1/15 had not been crossed off calendar. Pt remained in bed with all needs within reach and bed alarm activated.  Therapy Documentation Precautions:  Precautions Precautions: Fall Required Braces or Orthoses: Sling Restrictions Weight Bearing Restrictions: Yes RUE Weight Bearing: Non weight bearing Other Position/Activity Restrictions: Sling with activity for comfort, no ROM restrictions Pain: Pain Assessment Pain Scale: 0-10 Pain Score: 0-No pain   Therapy/Group: Individual Therapy  Leroy Libman 03/27/2020, 2:30 PM

## 2020-03-28 ENCOUNTER — Inpatient Hospital Stay (HOSPITAL_COMMUNITY): Payer: Medicare HMO | Admitting: Speech Pathology

## 2020-03-28 ENCOUNTER — Inpatient Hospital Stay (HOSPITAL_COMMUNITY): Payer: Medicare HMO

## 2020-03-28 NOTE — Progress Notes (Signed)
Occupational Therapy Session Note  Patient Details  Name: Brynlynn Walko MRN: [id number] Date of Birth: [birth year]  Today's Date: 03/28/2020 OT Individual Time: 8003-4917 OT Individual Time Calculation (min): 72 min    Short Term Goals: Week 3:  OT Short Term Goal 1 (Week 3): Pt will recall hemi dressing strategies with MIN VC OT Short Term Goal 2 (Week 3): Pt will transfer into shower wiht MIN A and LRAD consistently OT Short Term Goal 3 (Week 3): Pt will don footwear with MIN A and AE PRN  Skilled Therapeutic Interventions/Progress Updates:    Pt received supine with no c/o pain at rest. Pt reporting her breakfast has not been delivered. It was retrieved for her and pt attempted to bimanually cut food. She required manual facilitation for supported shoulder flexion but reporting this was hurting too much and she was encouraged to rest and unilaterally eat breakfast. Built up handle applied to her utensils, however poor fit d/t having plastic silverware. Pt completed ambulatory transfers in room without any AE with CGA throughout session. Pt with improved awareness and orientation, however still with overestimation of abilities and required cueing/education for continued need for hands on assist during transfers for safety and supervision overall. Bathing completed with very close supervision and cueing for completing distal LB seated instead of standing. Pt donned shirt with mod A. Pants and underwear donned CGA. Pt required min A to don shoes. Oral care with set up assist. Pt was left sitting up in the recliner with all needs met, chair alarm set.   Small piece of glass vs stitch present in pt's R shoulder at proximal end of incision.   Therapy Documentation Precautions:  Precautions Precautions: Fall Required Braces or Orthoses: Sling Restrictions Weight Bearing Restrictions: Yes RUE Weight Bearing: Non weight bearing Other Position/Activity Restrictions: Sling with activity for  comfort, no ROM restrictions  Therapy/Group: Individual Therapy  Curtis Sites 03/28/2020, 6:25 AM

## 2020-03-28 NOTE — Progress Notes (Signed)
Speech Language Pathology Daily Session Note  Patient Details  Name: Emily Rice MRN: [id number] Date of Birth: Jun 30, 1960  Today's Date: 03/28/2020 SLP Individual Time: 1000-1055 SLP Individual Time Calculation (min): 55 min  Short Term Goals: Week 3: SLP Short Term Goal 1 (Week 3): Patient will demonstrate functional problem solving for mildly complex tasks with Supervision verbal cues. SLP Short Term Goal 2 (Week 3): Patient will recall new, daily information with use of memory compensatory strategies with Supervision verbal cues. SLP Short Term Goal 3 (Week 3): Patient will demonstrate selective attention to functional tasks for 30 minutes in a mildly distracting enviornment with Min verbal cues for redirection. SLP Short Term Goal 4 (Week 3): Patient will self-monitor and correct errors during functional tasks with Supervision verbal cus for redirection.  Skilled Therapeutic Interventions: Skilled treatment session focused on cognitive goals. SLP facilitated session by providing Min A verbal cues for safety with ambulating around the room prior to transferring to the wheelchair. SLP administered the Cognistat to assess cognitive functioning and progress. Patient scored WFL on all substests with the exception of moderate impairments in memory and Ecologist. SLP provided education regarding results and current goals of care. She verbalized understanding and agreement. Patient transferred back to the recliner at end of session and left with alarm on and all needs within reach. Continue with current plan of care.       Pain No/Denies Pain   Therapy/Group: Individual Therapy  Carmel Garfield 03/28/2020, 12:31 PM
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
## 2020-03-29 ENCOUNTER — Inpatient Hospital Stay (HOSPITAL_COMMUNITY): Payer: Medicare HMO

## 2020-03-29 ENCOUNTER — Telehealth: Payer: Self-pay | Admitting: *Deleted

## 2020-03-29 ENCOUNTER — Encounter (HOSPITAL_COMMUNITY): Payer: Medicare HMO | Admitting: Psychology

## 2020-03-29 MED ORDER — QUETIAPINE FUMARATE 200 MG PO TABS
200.0000 mg | ORAL_TABLET | Freq: Every day | ORAL | Status: DC
  Administered 2020-03-29 – 2020-03-31 (×3): 200 mg via ORAL
  Filled 2020-03-29 (×3): qty 1

## 2020-03-29 MED ORDER — BOOST / RESOURCE BREEZE PO LIQD CUSTOM
1.0000 | Freq: Three times a day (TID) | ORAL | Status: DC
  Administered 2020-03-29 – 2020-04-05 (×22): 1 via ORAL
  Filled 2020-03-29 (×2): qty 1

## 2020-03-29 MED ORDER — APIXABAN 5 MG PO TABS
5.0000 mg | ORAL_TABLET | Freq: Two times a day (BID) | ORAL | Status: DC
  Administered 2020-03-29 – 2020-04-05 (×14): 5 mg via ORAL
  Filled 2020-03-29 (×14): qty 1

## 2020-03-29 MED ORDER — OXYCODONE HCL 5 MG PO TABS
5.0000 mg | ORAL_TABLET | Freq: Four times a day (QID) | ORAL | Status: DC | PRN
  Administered 2020-03-30: 10 mg via ORAL
  Filled 2020-03-29: qty 1
  Filled 2020-03-29: qty 2

## 2020-03-29 MED ORDER — METHOCARBAMOL 500 MG PO TABS: 500.0000 mg | ORAL_TABLET | Freq: Three times a day (TID) | ORAL | Status: DC | PRN

## 2020-03-29 NOTE — Progress Notes (Signed)
Speech Language Pathology Daily Session Note  Patient Details  Name: Emily Rice MRN: [id number] Date of Birth: 26-Jun-1960  Today's Date: 03/29/2020 SLP Individual Time: 1506-1600 SLP Individual Time Calculation (min): 54 min  Short Term Goals: Week 3: SLP Short Term Goal 1 (Week 3): Patient will demonstrate functional problem solving for mildly complex tasks with Supervision verbal cues. SLP Short Term Goal 2 (Week 3): Patient will recall new, daily information with use of memory compensatory strategies with Supervision verbal cues. SLP Short Term Goal 3 (Week 3): Patient will demonstrate selective attention to functional tasks for 30 minutes in a mildly distracting enviornment with Min verbal cues for redirection. SLP Short Term Goal 4 (Week 3): Patient will self-monitor and correct errors during functional tasks with Supervision verbal cus for redirection.  Skilled Therapeutic Interventions: Skilled treatment session focused on cognitive goals. Pt very lethargic during session, slowed processing and required frequent repetition of prompts. Pt unable to recall memory compensatory strategies from previous ST tx sessions, reviewed "write it down", "association" and "repetition". SLP completing patient's memory notebook to increase recall and carryover of therapeutic activities. SLP facilitating treatment session by providing min A verbal cues for simple word finding exercise, when SLP attempting to increase cueing pt stating "no don't help me", instead preferring extended time to complete task. Pt left in [facility] with alarm set and all needs within reach. Cont ST POC.   Pain Pain Assessment Pain Scale: 0-10 Pain Score: 2  Pain Location: Arm  Therapy/Group: Individual Therapy  Dewaine Conger 03/29/2020, 4:00 PM
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
## 2020-03-29 NOTE — Telephone Encounter (Signed)
PA for Aimovig 70mg  completed on covermymeds (key: [id number]). [id number]. Pt [id number]. Approved through 03/11/2021.
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
## 2020-03-29 NOTE — Progress Notes (Signed)
Physical Therapy Session Note  Patient Details  Name: Emily Rice MRN: [id number] Date of Birth: 1961/02/08  Today's Date: 03/29/2020 PT Individual Time: 0905-1000 PT Individual Time Calculation (min): 55 min   Short Term Goals: Week 2:  PT Short Term Goal 1 (Week 2): Patient will complete basic transfers with MinA consistently PT Short Term Goal 1 - Progress (Week 2): Met PT Short Term Goal 2 (Week 2): Patient will ambulate >43f with LRAD and MinA PT Short Term Goal 2 - Progress (Week 2): Met PT Short Term Goal 3 (Week 2): Patient will tolerate sitting up in wc >1 hour outside of therapy PT Short Term Goal 3 - Progress (Week 2): Met  Skilled Therapeutic Interventions/Progress Updates:     Patient in recliner in the room upon PT arrival. Patient alert and agreeable to PT session. Patient reported 6-7/10 R upper extremity pain during session, RN made aware. PT provided repositioning, rest breaks, and distraction as pain interventions throughout session. Patient with questions regarding R upper extremity recovery. Provided general education recovery for fx and nerve injury as well as TBI recovery. Patient very receptive and appreciative. Demonstrates poor recall of education provided in previous sessions, will continue to reinforce.   Donned R upper extremity sling with total A. Patient reported feeling "woozy" in standing during first trial. Donned abdominal binder for management of orthostasis. Patient denied symptoms throughout the remainder of the session.   Therapeutic Activity: Transfers: Patient performed sit to/from stand x5 with close supervision and x1 spontaneously, continues to focus on external distractions within the room and impulsively gets up to "straighten-up". Provided verbal cues for safety awareness.  Gait Training:  Patient ambulated >400 feet without an AD with CGA. Ambulated with decreased gait speed, step length, and step height, and mild downward head gaze.  Provided verbal cues for increased gait speed and L arm swing for improved balance and step length, cued patient to perform horizontal and vertical head turns, ambulate backwards, and perform sudden stops/turns for increased balance with dynamic gait. Patient with slow head turns and slow speed when performing sudden turns, demonstrated decreased speed with increased dynamic challenge with decreased balance due to increased SLS time and increased cognitive processing with increased challenge. She ambulated >200 feet cued for ambulating "as quickly, but as safely, as possible," with improved gait speed, arm swing, and balance when returning to her room.  Patient ascending/descended 4 steps x1 and 12 steps x2 using L rail with min A-CGA. Performed step-to gait pattern leading with R while ascending and L while descending due to decreased L leg strength with mild buckling x1 when descending on first trial. Provided cues for technique and sequencing. Patient had difficulty with recall of correct sequencing each trial. Able to problem solve by trialing each leg up on the step to determine to lead the R leg to ascend the stairs and recalled use of L leg to descend on last trial without cues.   Educated patient on fall risk/prevention, home modifications to prevent falls, and activation of emergency services in the event of a fall during session. Will reinforce with family prior to d/c.  Patient in recliner in the room with R upper extremity sling doffed and arm elevated on 2 pillows for comfort and edema control at end of session with breaks locked, seat blet alarm set, and all needs within reach.    Therapy Documentation Precautions:  Precautions Precautions: Fall Required Braces or Orthoses: Sling Restrictions Weight Bearing Restrictions: Yes RUE Weight Bearing:  Non weight bearing Other Position/Activity Restrictions: Sling with activity for comfort, no ROM restrictions   Therapy/Group: Individual  Therapy  Laster Appling L Roye Gustafson PT, DPT  03/29/2020, 12:27 PM
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
## 2020-03-29 NOTE — Progress Notes (Signed)
Occupational Therapy Session Note  Patient Details  Name: Emily Rice MRN: [id number] Date of Birth: 1960-05-16  Today's Date: 03/29/2020 OT Individual Time: 0735-0800 OT Individual Time Calculation (min): 25 min   Session 2:  OT Individual Time: 1415-1500 OT Individual Time Calculation (min): 45 min    Short Term Goals: Week 3:  OT Short Term Goal 1 (Week 3): Pt will recall hemi dressing strategies with MIN VC OT Short Term Goal 2 (Week 3): Pt will transfer into shower wiht MIN A and LRAD consistently OT Short Term Goal 3 (Week 3): Pt will don footwear with MIN A and AE PRN  Skilled Therapeutic Interventions/Progress Updates:    Session 1: pt received supine with no c/o pain, agreeable to OT session. Pt completed ambulatory transfer into the bathroom with CGA. Pt completed toileting tasks with CGA overall for continent urine void. Pt completed oral care at the sink with close supervision. Pt then completed 250 ft of functional mobility with CGA, no AE. Pt's BP was assessed after- 120/81. Sacral pad changed on her bottom. Pt was left sitting up in the recliner with chair alarm set, all needs met.    Session 2: Pt received sitting up with her husband present, agreeable to OT session. Reviewed session with neuropsych and depression s/s and risk with TBI. Pt completed ambulatory transfer, gathering items for shower with CGA. Pt with increased confidence however reduced insight into deficits. Pt completed bathing at shower level with close supervision overall, min cueing for completing LB bathing seated. Pt returned to EOB to dress. CGA for LB dressing and supervision for UB- best pt has done yet! Her and her husband were both very proud of her progress. Pt completed 200 ft of functional mobility with CGA. Discussed arm swing and balance. Pt completed dynamic grasp/release activity with active shoulder flexion component. Pt required min facilitation overall for activity. Pt returned to her  room, still demonstrating reduced awareness of deficits, stating "I can make it back from here by myself if you want to go". Pt was left sitting up in the recliner with all needs met, chair alarm set.    Therapy Documentation Precautions:  Precautions Precautions: Fall Required Braces or Orthoses: Sling Restrictions Weight Bearing Restrictions: Yes RUE Weight Bearing: Non weight bearing Other Position/Activity Restrictions: Sling with activity for comfort, no ROM restrictions  Therapy/Group: Individual Therapy  Curtis Sites 03/29/2020, 6:36 AM

## 2020-03-29 NOTE — Progress Notes (Signed)
Patient ID: Emily Rice, female   DOB: April 21, 1960, 60 y.o.   MRN: [id number]  SW met with pt in room to provide updates from team conference on gains made here in rehab, and will follow-up with pt husband to discuss family edu 1/19 and 1/23.  *SW left message for pt husband Hinda Kehr [phone]) to confirm family edu tomorrow, and provide updates. SW waiting on follow-up.   Loralee Pacas, MSW, [place] Office: [phone] Cell: [phone] Fax: [phone]

## 2020-03-29 NOTE — Discharge Instructions (Addendum)
Inpatient Rehab Discharge Instructions  Emily Rice Discharge date and time: No discharge date for patient encounter.   Activities/Precautions/ Functional Status: Activity: Nonweightbearing right upper extremity Diet: regular diet Wound Care: Routine skin checks Functional status:  ___ No restrictions     ___ Walk up steps independently ___ 24/7 supervision/assistance   ___ Walk up steps with assistance ___ Intermittent supervision/assistance  ___ Bathe/dress independently ___ Walk with walker     _x__ Bathe/dress with assistance ___ Walk Independently    ___ Shower independently ___ Walk with assistance    ___ Shower with assistance ___ No alcohol     ___ Return to work/school ________  COMMUNITY REFERRALS UPON DISCHARGE:    Outpatient: PT     OT    ST             Agency: [facility]     Phone: [phone]              Appointment Date/Time:*Please expect follow-up within 7-10 business days to schedule your appointment. If you you have not received follow-up, be sure to contact the site directly.*  Medical Equipment/Items Ordered: shower chair                                                 Agency/Supplier: [employer] [phone]    Special Instructions:  No driving smoking or alcohol    My questions have been answered and I understand these instructions. I will adhere to these goals and the provided educational materials after my discharge from the hospital.  Patient/Caregiver Signature _______________________________ Date __________  Clinician Signature _______________________________________ Date __________  Please bring this form and your medication list with you to all your follow-up doctor's appointments.        Information on my medicine - ELIQUIS (apixaban)  Why was Eliquis prescribed for you? Eliquis was prescribed for you to reduce the risk of a blood clot forming that can cause a stroke if you have a medical condition called atrial  fibrillation (a type of irregular heartbeat).  What do You need to know about Eliquis ? Take your Eliquis TWICE DAILY - one tablet in the morning and one tablet in the evening with or without food. If you have difficulty swallowing the tablet whole please discuss with your pharmacist how to take the medication safely.  Take Eliquis exactly as prescribed by your doctor and DO NOT stop taking Eliquis without talking to the doctor who prescribed the medication.  Stopping may increase your risk of developing a stroke.  Refill your prescription before you run out.  After discharge, you should have regular check-up appointments with your healthcare provider that is prescribing your Eliquis.  In the future your dose may need to be changed if your kidney function or weight changes by a significant amount or as you get older.  What do you do if you miss a dose? If you miss a dose, take it as soon as you remember on the same day and resume taking twice daily.  Do not take more than one dose of ELIQUIS at the same time to make up a missed dose.  Important Safety Information A possible side effect of Eliquis is bleeding. You should call your healthcare provider right away if you experience any of the following: ? Bleeding from an injury or your nose that does  not stop. ? Unusual colored urine (red or dark brown) or unusual colored stools (red or black). ? Unusual bruising for unknown reasons. ? A serious fall or if you hit your head (even if there is no bleeding).  Some medicines may interact with Eliquis and might increase your risk of bleeding or clotting while on Eliquis. To help avoid this, consult your healthcare provider or pharmacist prior to using any new prescription or non-prescription medications, including herbals, vitamins, non-steroidal anti-inflammatory drugs (NSAIDs) and supplements.  This website has more information on Eliquis (apixaban): http://www.eliquis.com/eliquis/home
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
## 2020-03-30 ENCOUNTER — Inpatient Hospital Stay (HOSPITAL_COMMUNITY): Payer: Medicare HMO

## 2020-03-30 ENCOUNTER — Ambulatory Visit (HOSPITAL_COMMUNITY): Payer: Medicare HMO

## 2020-03-30 ENCOUNTER — Encounter (HOSPITAL_COMMUNITY): Payer: Medicare HMO

## 2020-03-30 NOTE — Progress Notes (Signed)
Patient ID: Emily Rice, female   DOB: 1960-10-28, 60 y.o.   MRN: [id number]  SW gave pt HHA list ([email or web address]). SW to follow-up about preference.   SW ordered shower chair with [employer] via parachute.   SW returned phone call to pt husband Emily Rice [phone]) to inform DME: shower chair was ordered but unsure on the dimensions and can decide if they would like to have this item on Monday. SW also informed about HHA list left, and will follow-up about preference. Concerns related if benefits will cover. SW informed based on the information listed it states HH is covered at 100% however, can follow-up with insurance on benefits.   Loralee Pacas, MSW, [place] Office: [phone] Cell: [phone] Fax: [phone]

## 2020-03-30 NOTE — Progress Notes (Signed)
Physical Therapy Weekly Progress Note  Patient Details  Name: Emily Rice MRN: [id number] Date of Birth: June 05, 1960  Beginning of progress report period: March 23, 2020 End of progress report period: March 30, 2020  Today's Date: 03/30/2020 PT Individual Time: 0900-0950 and 1400-1505 PT Individual Time Calculation (min): 50 min and 65 min   Patient has met 4 of 4 short term goals.  Patient with excellent progress this week. Currently performs bed mobility and transfers with supervision, gait with CGA >650 ft, and 12 steps with CGA-min A. She continues to demonstrate decreased safety awareness, recall of new information, and delayed processing/planning contributing to safety/balance deficits with mobility. Will focus this week on d/c planning and family education in preparation for upcoming d/c next week.   Patient continues to demonstrate the following deficits muscle weakness and muscle joint tightness, decreased cardiorespiratoy endurance, decreased coordination, decreased attention to right, decreased initiation, decreased attention, decreased awareness, decreased problem solving, decreased safety awareness, decreased memory and delayed processing and decreased standing balance, decreased postural control, decreased balance strategies and difficulty maintaining precautions and therefore will continue to benefit from skilled PT intervention to increase functional independence with mobility.  Patient progressing toward long term goals..  Continue plan of care.  PT Short Term Goals Week 3:  PT Short Term Goal 1 (Week 3): Patient will perform bed mobility with supervision with consistent adhearance to R UE NWB without cues. PT Short Term Goal 1 - Progress (Week 3): Met PT Short Term Goal 2 (Week 3): Paient will perform transfers with CGA consistently. PT Short Term Goal 2 - Progress (Week 3): Met PT Short Term Goal 3 (Week 3): Patient will ambulate >200 feet with CGA without increased  assist for LOB and demonstraing good safety awareness. PT Short Term Goal 3 - Progress (Week 3): Met PT Short Term Goal 4 (Week 3): Patient will ambulated up/down 4 steps using 1 rail with min A. PT Short Term Goal 4 - Progress (Week 3): Met Week 4:  PT Short Term Goal 1 (Week 4): STG=LTG due to ELOS.  Skilled Therapeutic Interventions/Progress Updates:     Session 1: Patient in bed upon PT arrival. Patient alert and agreeable to PT session. Patient reported 6/10 R upper extremity pain during session, RN made aware. PT provided repositioning, rest breaks, and distraction as pain interventions throughout session. Patient requested to shower this morning prior to family education with her husband this afternoon. OT made aware and approved showering as this will not be the focus of family education this afternoon.   Therapeutic Activity: Bed Mobility: Patient performed supine to sit with supervision and increased time due to NWB on R upper extremity. Provided verbal cues for NWB precautions x1 and reminder that sitting up on the L would be easier for increased leverage with her L arm. Patient continues to insist on getting up on the R as she does at home. Transfers: Patient performed sit to/from stand with supervision from the bed x2, toilet x2, shower chair x3 and w/c with supervision. Provided verbal cues for forward weight shift and safety with transfers. Patient performed dressing and showering with supervision for donning/doffing socks, pants, and underwear and max A for doffing her shirt. Patient was able to don her shirt with supervision provided cues for technique with R arm ROM and weight bearing limitations. She performed showering with set-up assist and supervision for safety awareness as patient stands while trying to wash her feet and for rinsing and drying when she could  sit for reduced fall risk. Patient responsive to cues and stated understanding for safety concerns. Focused on standing  balance, safety awareness, use of R upper extremity for improved strength/ROM with a functional task and R hand grip during showering.  Patient ambulated around the room with CGA-close supervision, required redirection to task due to increased external distraction with tidying up the room. She sat in the w/c at the sink to wash her face and brush her teeth with set-up assist. Then ambulated to the recliner and donned a jacket with min-mod A for R upper extremity management.   Patient in recliner at end of session with breaks locked, seat belt alarm set, and all needs within reach.   Session 2: Patient in recliner with her husband present for family education when handed off from OT upon PT arrival. Patient alert and agreeable to PT session. Patient reported mild R upper extremity pain during session, RN aware. PT provided repositioning, rest breaks, and distraction as pain interventions throughout session.   Educated patient and her husband on patient's progress with mobility, deficits in balance and safety awareness requiring 24/7 supervision for safety, d/c planning, home set-up, and TBI recovery, signs and symptoms, and strategies for management of symptoms throughout session.   Donned R upper extremity sling with total A for mobility during session. Educated patient's husband on donning/doffing technique and instructed patient to wear it with standing/walking mobility per MD recommendations.   Therapeutic Activity: Transfers: Patient performed sit to/from stand transfers with supervision for safety throughout session. Educated patient's husband on remaining within arms reach during transfers due to patient having intermittent orthostasis. Educated on signs and symptoms associated with orthostatic hypotension and use of thigh high TEDs and abdominal binder for management. Patient without symptoms, except x1 at end of session, with TEDs donned and without abdominal binder.  Patient performed a  simulated sedan height car transfer with CGA for safety using door frame and car seat for balance with L hand. Provided cues for safe technique.  Gait Training:  Patient ambulated >250 feet, >100 feet, and >350 feet without and AD with CGA using a gait belt for safety with patient's husband performing safe guarding on second 2 trials. Ambulated with decreased gait speed, step length and height, mild veer R with patient able to self correct, decreased L arm swing, and 2 minor LOB to the R. Provided verbal cues for increased gait speed and arm swing for improved balance and for patient's husband to guard on the R due to mild inattention and increased LOB to that side.  Patient ascended/descended 8 steps x2 using L rail on first trial and L rain ascending and R rail descending with side-stepping technique holding with L hand to simulate home set-up with CGA. Performed step-to gait pattern leading with R while ascending and L while descending. Provided cues for technique and sequencing. Educated and demonstrated safe guarding techniques and discussed fall prevention and strategies to prevent injury in the event of a fall on the stairs. Patient's husband demonstrated safe guarding technique with patient on the stairs and both the patient and her husband were very attentive during education.   Educated patient and her husband on fall risk/prevention, home modifications to prevent falls, and activation of emergency services in the event of a fall during session.   Patient performed an ambulatory transfer to/from the bathroom with CGA-close supervision. Patient was continent of bowl and bladder during toileting. Performed peri-care and lower body clothing management independently with supervision for balance/safety.  Patient in recliner at end of session with breaks locked, seat belt alarm set, and all needs within reach.   Therapy Documentation Precautions:  Precautions Precautions: Fall Required Braces  or Orthoses: Sling Restrictions Weight Bearing Restrictions: Yes RUE Weight Bearing: Non weight bearing Other Position/Activity Restrictions: Sling with activity for comfort, no ROM restrictions  Therapy/Group: Individual Therapy  Teal Bontrager L Peggyann Zwiefelhofer PT, DPT  03/30/2020, 7:36 PM
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
## 2020-03-30 NOTE — Progress Notes (Signed)
Occupational Therapy Weekly Progress Note  Patient Details  Name: Emily Rice MRN: [id number] Date of Birth: 09-30-1960  Beginning of progress report period: March 21, 2020 End of progress report period: March 30, 2020  Today's Date: 03/30/2020 OT Individual Time: 1305-1400 OT Individual Time Calculation (min): 55 min    Patient has met 3 of 3 short term goals.  [name] has made excellent progress this reporting period. She is now able to complete UB bathing and dressing with supervision overall and CGA for LB ADLs. She has progressed significantly with her orientation and attention, however still demonstrates reduced insight into deficits. Family education is ongoing and pt is on track for d/c on 1/25.   Patient continues to demonstrate the following deficits: muscle weakness, decreased coordination, decreased initiation, decreased attention, decreased awareness, decreased problem solving, decreased safety awareness, decreased memory and delayed processing and decreased standing balance and decreased balance strategies and therefore will continue to benefit from skilled OT intervention to enhance overall performance with BADL and iADL.  Patient progressing toward long term goals..  Continue plan of care.  OT Short Term Goals Week 3:  OT Short Term Goal 1 (Week 3): Pt will recall hemi dressing strategies with MIN VC OT Short Term Goal 1 - Progress (Week 3): Met OT Short Term Goal 2 (Week 3): Pt will transfer into shower wiht MIN A and LRAD consistently OT Short Term Goal 2 - Progress (Week 3): Met OT Short Term Goal 3 (Week 3): Pt will don footwear with MIN A and AE PRN OT Short Term Goal 3 - Progress (Week 3): Met Week 4:  OT Short Term Goal 1 (Week 4): STG=LTG d/t ELOS  Skilled Therapeutic Interventions/Progress Updates:    Pt up walking in room by herself upon OT entry. Reviewed need for supervision, pt with poor awareness/insight. Pt continued functional mobility around room with  close supervision to CGA as she cleaned up and looked for scarf. Toileting completed with close supervision. Min cueing for compensatory propping method for bimanual donning of her head scarf. Pt's husband arrived for family edu session. Pt demonstrated IADL simulation task of folding laundry in standing, mod cueing/edu provided for compensatory methods with R UE limitations. Pt able to return with close supervision. Spent remainder of session focusing on TBI education, pt reduced awareness/insight, and importance of return to ADL/IADLs for self efficacy at home. Pt passed off to PT in room.   Therapy Documentation Precautions:  Precautions Precautions: Fall Required Braces or Orthoses: Sling Restrictions Weight Bearing Restrictions: Yes RUE Weight Bearing: Non weight bearing Other Position/Activity Restrictions: Sling with activity for comfort, no ROM restrictions   Therapy/Group: Individual Therapy  Curtis Sites 03/30/2020, 6:46 AM

## 2020-03-30 NOTE — Progress Notes (Signed)
Speech Language Pathology Daily Session Note  Patient Details  Name: Emily Rice MRN: [id number] Date of Birth: Jul 31, 1960  Today's Date: 03/30/2020 SLP Individual Time: 1410-1440 SLP Individual Time Calculation (min): 30 min  Short Term Goals: Week 3: SLP Short Term Goal 1 (Week 3): Patient will demonstrate functional problem solving for mildly complex tasks with Supervision verbal cues. SLP Short Term Goal 2 (Week 3): Patient will recall new, daily information with use of memory compensatory strategies with Supervision verbal cues. SLP Short Term Goal 3 (Week 3): Patient will demonstrate selective attention to functional tasks for 30 minutes in a mildly distracting enviornment with Min verbal cues for redirection. SLP Short Term Goal 4 (Week 3): Patient will self-monitor and correct errors during functional tasks with Supervision verbal cus for redirection.  Skilled Therapeutic Interventions: Skilled tx session focused on cognitive goals. SLP facilitated session by providing mod fading to min A verbal cues for problem solving task relating to safety and potential hazard recognition in home environment. Pt benefits from extra time to ID and solve problems related to daily living. Pt continues to demonstrate word finding impairment during structured and unstructured tasks. Pt left in wheelchair with alarm on and all needs within reach. Cont ST POC.   Pain Pain Assessment Pain Scale: 0-10 Pain Score: 0-No pain  Therapy/Group: Individual Therapy  Dewaine Conger 03/30/2020, 3:44 PM

## 2020-03-31 ENCOUNTER — Inpatient Hospital Stay (HOSPITAL_COMMUNITY): Payer: Medicare HMO

## 2020-03-31 ENCOUNTER — Inpatient Hospital Stay (HOSPITAL_COMMUNITY): Payer: Medicare HMO | Admitting: Speech Pathology

## 2020-03-31 ENCOUNTER — Inpatient Hospital Stay (HOSPITAL_COMMUNITY): Payer: Medicare HMO | Admitting: Occupational Therapy

## 2020-03-31 MED ORDER — OXYCODONE HCL 5 MG PO TABS
5.0000 mg | ORAL_TABLET | Freq: Four times a day (QID) | ORAL | Status: DC | PRN
  Administered 2020-03-31 – 2020-04-01 (×3): 5 mg via ORAL
  Filled 2020-03-31 (×3): qty 1
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
## 2020-03-31 NOTE — Progress Notes (Signed)
Physical Therapy Session Note  Patient Details  Name: Emily Rice MRN: [id number] Date of Birth: 07-15-1960  Today's Date: 03/31/2020 PT Individual Time: 1010-1105 PT Individual Time Calculation (min): 55 min   Short Term Goals: Week 4:  PT Short Term Goal 1 (Week 4): STG=LTG due to ELOS.  Skilled Therapeutic Interventions/Progress Updates:     Patient in recliner upon PT arrival. Patient alert and agreeable to PT session. Patient reported 6-7/10 R upper extremity pain during session, RN made aware and provided pain medicine during session. PT provided repositioning, rest breaks, and distraction as pain interventions throughout session.  Patient without thigh high TEDs and abdominal binder donned at beginning of session.  Orthostatic Vitals: Sitting: BP 123/74, HR 67 Standing: BP 116/74, HR 78 Standing x3 min: BP 111/84, HR 81 (mild dizziness reported, quickly resolved in sitting)  Due to systolic BP <86 point drop with prolonged standing, left TEDs and binder off to assess patient's tolerance with activity. Patient denied any further symptoms throughout session.   Therapeutic Activity: Transfers: Patient performed sit to/from stand transfers with supervision for safety throughout session. She performed ambulatory transfer to/from the bathroom and within the room. Patient continues to be externally distracted by moving belongings in the room with decreased balance with gait, required min A x2 due to minor LOB, otherwise CGA. Patient was continent of bladder during toileting, performed peri-care and lower body clothing management independently with supervision for balance in standing. Donned R upper extremity sling and gait belt prior to mobilizing outside of the room.  Gait Training:  Patient ambulated >100 feet x2 without an AD with CGA while performing path-finding task to/from her room to the Day Room and locating, without taking, routs to previous visited location (identified route  to the main therapy gym and an alternative route to her room from the Day Room). Ambulated with CGA throughout. Provided verbal cues for increased gait speed and arm swing for improved balance, noted slowed speed prior to intersections due to busy environment and attention to path finding task.  Neuromuscular Re-ed: Patient performed the [facility] Scale, see details below: Patient demonstrates increased fall risk as noted by score of 46/56 on Berg Balance Scale.  (<36= high risk for falls, close to 100%; 37-45 significant >80%; 46-51 moderate >50%; 52-55 lower >25%)  Patient required intermittent sitting breaks and one ambulating rest break, ambulated ~180 feet as above, during assessment due to increased cognitive fatigue from attending to structured assessment.   Educated patient on result and interpretation from standardized assessment. Educated on fall risk concerns and recommendations for having someone with her at all times with CGA-close supervision for safety to reduce risk of falls at d/c. Patient stated understanding.   Patient in recliner in the room at end of session with breaks locked, seat belt alarm set, and all needs within reach.    Therapy Documentation Precautions:  Precautions Precautions: Fall Required Braces or Orthoses: Sling Restrictions Weight Bearing Restrictions: Yes RUE Weight Bearing: Non weight bearing Other Position/Activity Restrictions: Sling with activity for comfort, no ROM restrictions   Therapy/Group: Individual Therapy  Agnieszka Newhouse L Shailee Foots PT, DPT  03/31/2020, 6:06 PM

## 2020-03-31 NOTE — Progress Notes (Signed)
Physical Therapy Session Note  Patient Details  Name: Emily Rice MRN: [id number] Date of Birth: 04-01-1960  Today's Date: 03/31/2020 PT Individual Time: 1530-1600 PT Individual Time Calculation (min): 30 min   Short Term Goals: Week 3:  PT Short Term Goal 1 (Week 4): STG=LTG due to ELOS.  Skilled Therapeutic Interventions/Progress Updates:    Patient in room in recliner talking with daughter on the phone.  Difficulty hanging up once finished conversation needing increased time.  Performed sit to stand with CGA as LOB and back in chair after donning sling.  Patient ambulated to therapy gym with close [place].  Performed forward gait with head turns to ID playing cards with CGA slow at times to recall number or suit.  Spouse arrived to watch session.  Noted wide veering from straight path.  Patient returned to sit in chair and noted increased anterior weight shift and risk for anterior LOB provided CGA for safety.  Ambulated to hallway to perform tandem gait with CGA, forward tandem on tip toes with min A (HHA), side stepping with head turns to ID cards with CGA.  Patient on mat performed L sidelying test for BPPV and noted inconsisent intermittent nystagmus.  Performed full hall pike to L then R and pt reports no difference and intermittent dizziness noting again inconsistent intermittent nystagmus not unidirectional.  Patient with significant imbalance and dizziness both times returning upright needing min to mod A for balance and cues for forward gaze to stationary target.  Standing on foam eyes closed with min A for balance due to significant sway x 30 sec and pt reporting feeling very unsteady.  Standing on foam eyes open with feet apart 30 sec with close S/CGA with sway noted as well.  Educated pt plans for continued work on balance and likely not true BPPV though some positional symptoms normal after head injury.  Patient ambulated to room with CGA.  Left in recliner with spouse in the room and RN  aware no alarm belt on per pt request. (spouse to notify RN when leaving).  Therapy Documentation Precautions:  Precautions Precautions: Fall Required Braces or Orthoses: Sling Restrictions Weight Bearing Restrictions: Yes RUE Weight Bearing: Non weight bearing Other Position/Activity Restrictions: Sling with activity for comfort, no ROM restrictions Pain: Pain Assessment Faces Pain Scale: Hurts little more Pain Type: Acute pain Pain Location: Shoulder Pain Orientation: Right Pain Descriptors / Indicators: Aching Pain Onset: With Activity Pain Intervention(s): Rest   Therapy/Group: Individual Therapy  Reginia Naas  Magda Kiel, PT 03/31/2020, 4:31 PM

## 2020-03-31 NOTE — Progress Notes (Signed)
Occupational Therapy Session Note  Patient Details  Name: Emily Rice MRN: [id number] Date of Birth: Jul 31, 1960  Today's Date: 03/31/2020 OT Individual Time: 1350-1430 OT Individual Time Calculation (min): 40 min    Short Term Goals: Week 3:  OT Short Term Goal 1 (Week 3): Pt will recall hemi dressing strategies with MIN VC OT Short Term Goal 1 - Progress (Week 3): Met OT Short Term Goal 2 (Week 3): Pt will transfer into shower wiht MIN A and LRAD consistently OT Short Term Goal 2 - Progress (Week 3): Met OT Short Term Goal 3 (Week 3): Pt will don footwear with MIN A and AE PRN OT Short Term Goal 3 - Progress (Week 3): Met Week 4:  OT Short Term Goal 1 (Week 4): STG=LTG d/t ELOS  Skilled Therapeutic Interventions/Progress Updates:    patient seated in recliner, pleasant and cooperative, appropriate affect t/o session.  She is able to stand from recliner and walk to/from recliner, toilet, chair in therapy gym with [place].  toileting and hand hygiene in stance completed with CGA.  Reviewed purpose and use of wrist cock up splint  - completed hand grasp/release activities in stance, right arm AROM, closed chain hand, wrist and elbow ROM.  She ambulated back to room with min cues and CGA.  She returned to recliner, seat belt alarm set and call bell in hand.    Therapy Documentation Precautions:  Precautions Precautions: Fall Required Braces or Orthoses: Sling Restrictions Weight Bearing Restrictions: Yes RUE Weight Bearing: Non weight bearing Other Position/Activity Restrictions: Sling with activity for comfort, no ROM restrictions   Therapy/Group: Individual Therapy  Carlos Levering 03/31/2020, 7:42 AM

## 2020-03-31 NOTE — Plan of Care (Signed)
  Problem: RH Problem Solving Goal: LTG Patient will demonstrate problem solving for (SLP) Description: LTG:  Patient will demonstrate problem solving for basic/complex daily situations with cues  (SLP) Flowsheets (Taken 03/31/2020 0718) LTG Patient will demonstrate problem solving for: Minimal Assistance - Patient > 75% Note: Goal downgraded due to inconsistent progress   Problem: RH Memory Goal: LTG Patient will use memory compensatory aids to (SLP) Description: LTG:  Patient will use memory compensatory aids to recall biographical/new, daily complex information with cues (SLP) Flowsheets (Taken 03/31/2020 0718) LTG: Patient will use memory compensatory aids to (SLP): Minimal Assistance - Patient > 75% Note: Goal downgraded due to inconsistent progress   Problem: RH Attention Goal: LTG Patient will demonstrate this level of attention during functional activites (SLP) Description: LTG:  Patient will will demonstrate this level of attention during functional activites (SLP) Flowsheets (Taken 03/31/2020 0718) LTG: Patient will demonstrate this level of attention during cognitive/linguistic activities with assistance of (SLP): Minimal Assistance - Patient > 75% Note: Goal downgraded due to inconsistent progress   Problem: RH Awareness Goal: LTG: Patient will demonstrate awareness during functional activites type of (SLP) Description: LTG: Patient will demonstrate awareness during functional activites type of (SLP) Flowsheets (Taken 03/31/2020 0718) LTG: Patient will demonstrate awareness during cognitive/linguistic activities with assistance of (SLP): Minimal Assistance - Patient > 75% Note: Goal downgraded due to inconsistent progress

## 2020-03-31 NOTE — Progress Notes (Signed)
Speech Language Pathology Weekly Progress and Session Note  Patient Details  Name: Emily Rice MRN: [id number] Date of Birth: 1960/12/20  Beginning of progress report period: March 23, 2020 End of progress report period: March 31, 2020  Today's Date: 03/31/2020 SLP Individual Time: 6546-5035 SLP Individual Time Calculation (min): 60 min  Short Term Goals: Week 3: SLP Short Term Goal 1 (Week 3): Patient will demonstrate functional problem solving for mildly complex tasks with Supervision verbal cues. SLP Short Term Goal 1 - Progress (Week 3): Not met SLP Short Term Goal 2 (Week 3): Patient will recall new, daily information with use of memory compensatory strategies with Supervision verbal cues. SLP Short Term Goal 2 - Progress (Week 3): Not met SLP Short Term Goal 3 (Week 3): Patient will demonstrate selective attention to functional tasks for 30 minutes in a mildly distracting enviornment with Min verbal cues for redirection. SLP Short Term Goal 3 - Progress (Week 3): Met SLP Short Term Goal 4 (Week 3): Patient will self-monitor and correct errors during functional tasks with Supervision verbal cus for redirection. SLP Short Term Goal 4 - Progress (Week 3): Not met    New Short Term Goals: Week 4: SLP Short Term Goal 1 (Week 4): STGs=LTGs due to ELOS  Weekly Progress Updates: Patient continues to make slow but inconsistent gains and has met 1 of 4 STGs this reporting period. Currently, patient requires overall Min A verbal cues to complete functional and familiar tasks safely in regards to complex problem solving, emergent awareness, selective attention and recall with use of strategies. Patient and family education is ongoing. Patient would benefit from continued skilled SLP intervention to maximize her cognitive functioning and overall functional independence prior to discharge.     Intensity: Minumum of 1-2 x/day, 30 to 90 minutes Frequency: 3 to 5 out of 7  days Duration/Length of Stay: 04/05/20 Treatment/Interventions: Cognitive remediation/compensation;Internal/external aids;Environmental Environmental consultant;Therapeutic Activities;Functional tasks;Patient/family education   Daily Session  Skilled Therapeutic Interventions:  Skilled treatment session focused on cognitive goals. Upon arrival, patient engaged in a conversation with SLP that focused on love of volunteering and causes that are important to her. Throughout conversation, patient with word-finding deficits with severely impaired thought organization resulting in hesitations and repetition of the same information. Therefore, SLP focused session on thought organization. SLP facilitated session by providing extra time and Min A verbal cues for thought organization and sequencing during a mildly complex meal task in which patient had to generate an ingredient list for the multiple components of the meal and sequence all the steps. At end of session, patient requested to take the alarm belt off "to do things around the room." SLP provided education on why that is currently unsafe, she verbalized understanding but then reported she wold have her husband pick her up and take her home. Again, SLP provided education regarding on why she is not ready to discharge at this time. She verbalized understanding. Patient left upright in the recliner with alarm on and all needs within reach. Continue with current plan of care.      Pain No/Denies Pain   Therapy/Group: Individual Therapy  Cameran Pettey 03/31/2020, 7:21 AM
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
## 2020-04-01 ENCOUNTER — Inpatient Hospital Stay (HOSPITAL_COMMUNITY): Payer: Medicare HMO

## 2020-04-01 ENCOUNTER — Inpatient Hospital Stay (HOSPITAL_COMMUNITY): Payer: Medicare HMO | Admitting: Speech Pathology

## 2020-04-01 LAB — BASIC METABOLIC PANEL
Anion gap: 9 (ref 5–15)
BUN: 9 mg/dL (ref 6–20)
CO2: 26 mmol/L (ref 22–32)
Calcium: 9 mg/dL (ref 8.9–10.3)
Chloride: 107 mmol/L (ref 98–111)
Creatinine, Ser: 0.81 mg/dL (ref 0.44–1.00)
GFR, Estimated: >60 mL/min (ref >60)
Glucose, Bld: 89 mg/dL (ref 70–99)
Potassium: 3.9 mmol/L (ref 3.5–5.1)
Sodium: 142 mmol/L (ref 135–145)

## 2020-04-01 LAB — CBC
HCT: 34.1 % — ABNORMAL LOW (ref 36.0–46.0)
Hemoglobin: 10.7 g/dL — ABNORMAL LOW (ref 12.0–15.0)
MCH: 28.2 pg (ref 26.0–34.0)
MCHC: 31.4 g/dL (ref 30.0–36.0)
MCV: 89.7 fL (ref 80.0–100.0)
Platelets: 164 10*3/uL (ref 150–400)
RBC: 3.8 MIL/uL — ABNORMAL LOW (ref 3.87–5.11)
RDW: 17.5 % — ABNORMAL HIGH (ref 11.5–15.5)
WBC: 6 10*3/uL (ref 4.0–10.5)
nRBC: 0 % (ref 0.0–0.2)

## 2020-04-01 MED ORDER — TRAMADOL HCL 50 MG PO TABS
50.0000 mg | ORAL_TABLET | Freq: Four times a day (QID) | ORAL | Status: DC | PRN
  Administered 2020-04-01: 50 mg via ORAL
  Filled 2020-04-01: qty 1

## 2020-04-01 MED ORDER — ACETAMINOPHEN 325 MG PO TABS: 650.0000 mg | ORAL_TABLET | Freq: Four times a day (QID) | ORAL | Status: DC | PRN

## 2020-04-01 MED ORDER — QUETIAPINE FUMARATE 50 MG PO TABS
150.0000 mg | ORAL_TABLET | Freq: Every day | ORAL | Status: DC
  Administered 2020-04-01 – 2020-04-03 (×3): 150 mg via ORAL
  Filled 2020-04-01 (×3): qty 3

## 2020-04-01 NOTE — Progress Notes (Signed)
Physical Therapy Session Note  Patient Details  Name: Emily Rice MRN: [id number] Date of Birth: Sep 16, 1960  Today's Date: 04/01/2020 PT Individual Time:Session1: 4098-1191; Session2: 1405-1500 PT Individual Time Calculation (min): 33 min & 55 min  Short Term Goals: Week 4:  PT Short Term Goal 1 (Week 4): STG=LTG due to ELOS.  Skilled Therapeutic Interventions/Progress Updates:    Session1:  Patient seated in recliner and reports wants to take a shower.  Noted no OT planned for today so pt CGA for sit to stand to walk to chest of drawers to obtain clothing.  Patient disorganized and initially placing clothing on sink and asking PT to place w/c by the sink, then walking back to chest of drawers to doff shoes until cued by PT needs supportive/safe footwear to walk into bathroom.  Patient CGA to walk to bathroom not wearing sling.  Leaning over to place towel on the floor till cues from PT not to lean over and to allow PT to assist.  Patient stand to sit on toilet after doffing pants/underwear with CGA.  Toileted then doffed shoes, socks and pants.  Assisted to walk to shower where she had already turned water on prior to toileting to allow water to warm up.  Patient standing to wash in shower with chair right behind her completing face, arms, chest, perineal area then needed cues to sit for safety to complete feet and legs. Cues to use a new washcloth available in front of her for rinsing instead of continuing to ring out the same wash cloth.  Assisted to use shower wand to rinse back, then assisted to dry back and lower legs, feet while seated on shower seat.  Cued pt to sit while PT retrieved clothing from bed, pt had stood to move to side of shower seat while unattended.  Educated needing to wait for assist prior to standing in shower.  Patient seated to don underwear, then socks, then pants (stood to pull up underwear prior to donning footwear despite cues for sequencing for safety.  Patient  completed dressing seated with mod A for pull over shirt to sequence due to R arm injury.  Patient assisted with deodorant for L arm after pt attempting unsuccessfully to use R arm to apply.  Patient assisted to recliner with CGA, applied k-pad to R shoulder and left with alarm belt active and needs in reach.  [place]:  Patient in recliner and reports pain medication not helping shoulder pain today and first stated she had spoken with nurse about needing to ask MD to change medication, then later asked if she could speak to RN.  Patient sit to stand from low recliner with multiple attempts for success and CGA.  Donned sling to R UE.  Patient ambulated with CGA and no AD to therapy gym.  Performed stair negotiation 12 steps initially with L rail to ascend then sidestepping with L rail to descend x 4, then L rail to ascend and R rail to descend x 8 steps as pt with inconsistent reports of home set up.  Performed coordination/balance activities in floor ladder stepping forward in/in; out/out then side stepping in/in; out/out bidirectional.  Floor ladder on floor mat to step through ladder working on compliant surface training and single limb stance time.  Then on floor mat stepping over and back center line, then on uneven terrain with bean bags under floor mat with CGA.  Patient ambulated to ortho gym and negotiated ramp and mulch/curb step with CGA.  Then  side stepping up and down ramp facing both directions noting LOB to R going down ramp to R then LOB to R going up ramp to R with min to mod A recovery.  Seated to discuss plans for follow up and possibility for outpatient rehab if has transportation.  Problem solving why she would not be able to drive herself for quite some time.  Wayfinding back to her room with min questioning cues.  Patient located Network engineer at desk to ask her nurse to come see her.  Patient spoke with nurse in her room about medication for pain not working and to communicate with MD.  Left  with belt alarm active, k-pad on R shoulder, needs in reach and RN in the room.   Therapy Documentation Precautions:  Precautions Precautions: Fall Required Braces or Orthoses: Sling Restrictions Weight Bearing Restrictions: Yes RUE Weight Bearing: Non weight bearing Other Position/Activity Restrictions: Sling with activity for comfort, no ROM restrictions Pain: Pain Assessment Pain Scale: Faces Faces Pain Scale: Hurts a little bit Pain Type: Acute pain Pain Location: Arm Pain Orientation: Right Pain Descriptors / Indicators: Aching Pain Onset: On-going Pain Intervention(s): Distraction;Other (Comment) (heat had already been applied) Multiple Pain Sites: No   Therapy/Group: Individual Therapy  Reginia Naas  [place], PT 04/01/2020, 8:28 AM
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
## 2020-04-01 NOTE — Progress Notes (Signed)
Physical Therapy Session Note  Patient Details  Name: Emily Rice MRN: [id number] Date of Birth: November 14, 1960  Today's Date: 04/01/2020 PT Individual Time: 8295-6213 PT Individual Time Calculation (min): 45 min   Short Term Goals: Week 4:  PT Short Term Goal 1 (Week 4): STG=LTG due to ELOS.  Skilled Therapeutic Interventions/Progress Updates:    Pt presents EOB awaiting staff to assist with toileting (urgent). Requires CGA overall for balance and safety due to impulsivity to get to bathroom in time for functional transfers and gait. Pt was continent of urine and manges clothing and hygiene independently. Session focused on overall functional mobility in the room, cognitive remediation, and overall functional endurance and activity tolerance. Engaged in clothing prep task to prepare for shower later this morning including getting clothing out of lower drawers, transfers in the room, and setting up correct items including identifying need for more soap in the shower. Mild LOB's throughout requiring CGA overall for balance for safety. Pt without Tedhose on and denies symptoms of orthostasis initially. Engaged in functional task in room to change sheets on bed and put on fresh sheets as a self directed activity requiring some assistance due to NWB RUE (removed sling at this time per her request) but able to correctly sequence and problem solve technique including raising bed up to her height, walking around to the other side of the bed instead of reaching over unsafely WB through RUE, etc. Pt does then report feeling a bit lightheaded and requires cues and encouragement to sit down. Educated on the importance of self awareness to symptoms and self monitoring including upon d/c. Pt verbalized understanding and in agreement. Encouraged to sit up in recliner while awaiting next therapy session (requesting to shower then) in which she perseverating some on just wanting to hurry up and get into the shower.  Reminded her that this could occur during her next session at 11:30am. Appreciative of session overall and left with all needs in reach.   Therapy Documentation Precautions:  Precautions Precautions: Fall Required Braces or Orthoses: Sling Restrictions Weight Bearing Restrictions: Yes RUE Weight Bearing: Non weight bearing Other Position/Activity Restrictions: Sling with activity for comfort, no ROM restrictions  Pain: Pain Assessment Pain Scale: 0-10 Pain Score: 7  Pain Location: Arm Pain Orientation: Right Pain Descriptors / Indicators: Aching  Reports being premedicated and RN aware. Uses sling off and on throughout session.    Therapy/Group: Individual Therapy  Canary Brim Ivory Broad, PT, DPT, CBIS  04/01/2020, 12:11 PM

## 2020-04-01 NOTE — Progress Notes (Signed)
Speech Language Pathology Daily Session Note  Patient Details  Name: Emily Rice MRN: [id number] Date of Birth: 1960/07/14  Today's Date: 04/01/2020 SLP Individual Time: 1300-1355 SLP Individual Time Calculation (min): 55 min  Short Term Goals: Week 4: SLP Short Term Goal 1 (Week 4): STGs=LTGs due to ELOS  Skilled Therapeutic Interventions: Pt was seen for skilled ST targeting cognitive goals. Upon arrival pt expressed feeling "down" about losing independence upon d/c and having to rely on her husband to do more things for her such as driving and cooking. SLP provided supportive listening and feedback regarding why those tasks wouldn't be safe, which pt recognized. She also completed a semi-complex 4-step action card sequencing task with extra time and Min A verbal and visual cues for error awareness and problem solving. Of note, she did still exhibit intermittent semantic paraphasias, and repeated ideas as is consistent with disorganized though pattern mentioned in previous ST note. Min-Mod cueing provided for thought organization and fluent communication about discharge. Pt left sitting in recliner with alarm set and needs within reach. Continue per current plan of care.          Pain Pain Assessment Pain Scale: Faces Faces Pain Scale: Hurts a little bit Pain Type: Acute pain Pain Location: Arm Pain Orientation: Right Pain Descriptors / Indicators: Aching Pain Onset: On-going Pain Intervention(s): Distraction;Other (Comment) (heat had already been applied) Multiple Pain Sites: No  Therapy/Group: Individual Therapy  Arbutus Leas 04/01/2020, 7:15 AM

## 2020-04-01 NOTE — Progress Notes (Signed)
Pt states she is still having on going pain in her right shoulder, elbow, and arm. Rated pain 6/10 but it goes up with activity, feels like just Ultram will not solve the ongoing pain, and has a fear of going home Tuesday with out enough medication to control her pain. Pt has a headache as well. Asked patient if she would like anything for pain at this moment, but she would like to wait until this evening. No other complaints at this time.   Dayna Ramus, LPN

## 2020-04-01 NOTE — Plan of Care (Signed)
  Problem: Consults Goal: RH BRAIN INJURY PATIENT EDUCATION Description: Description: See Patient Education module for eduction specifics Outcome: Progressing Goal: Skin Care Protocol Initiated - if Braden Score 18 or less Description: If consults are not indicated, leave blank or document N/A Outcome: Progressing Goal: Nutrition Consult-if indicated Outcome: Progressing   Problem: RH BOWEL ELIMINATION Goal: RH STG MANAGE BOWEL WITH ASSISTANCE Description: STG Manage Bowel with supervision Assistance. Outcome: Progressing Goal: RH STG MANAGE BOWEL W/MEDICATION W/ASSISTANCE Description: STG Manage Bowel with Medication with supervision Assistance. Outcome: Progressing   Problem: RH BLADDER ELIMINATION Goal: RH STG MANAGE BLADDER WITH ASSISTANCE Description: STG Manage Bladder With supervision Assistance Outcome: Progressing   Problem: RH SKIN INTEGRITY Goal: RH STG SKIN FREE OF INFECTION/BREAKDOWN Description: Skin to remain free from infection and breakdown with supervision assist. Outcome: Progressing Goal: RH STG MAINTAIN SKIN INTEGRITY WITH ASSISTANCE Description: STG Maintain Skin Integrity With supervision Assistance. Outcome: Progressing Goal: RH STG ABLE TO PERFORM INCISION/WOUND CARE W/ASSISTANCE Description: STG Able To Perform Incision/Wound Care With supervision Assistance. Outcome: Progressing   Problem: RH SAFETY Goal: RH STG ADHERE TO SAFETY PRECAUTIONS W/ASSISTANCE/DEVICE Description: STG Adhere to Safety Precautions With supervision Assistance/Device. Outcome: Progressing Goal: RH STG DECREASED RISK OF FALL WITH ASSISTANCE Description: STG Decreased Risk of Fall With supervision Assistance. Outcome: Progressing   Problem: RH COGNITION-NURSING Goal: RH STG USES MEMORY AIDS/STRATEGIES W/ASSIST TO PROBLEM SOLVE Description: STG Uses Memory Aids/Strategies With cues and reminders Assistance to Problem Solve. Outcome: Progressing Goal: RH STG ANTICIPATES  NEEDS/CALLS FOR ASSIST W/ASSIST/CUES Description: STG Anticipates Needs/Calls for Assist With supervision Assistance/Cues. Outcome: Progressing   Problem: RH PAIN MANAGEMENT Goal: RH STG PAIN MANAGED AT OR BELOW PT'S PAIN GOAL Description: <4 on a 0-10 pain scale Outcome: Progressing   Problem: RH KNOWLEDGE DEFICIT BRAIN INJURY Goal: RH STG INCREASE KNOWLEDGE OF SELF CARE AFTER BRAIN INJURY Description: Patient will demonstrate knowledge of [place] levels, medication management, fall precautions, safety precautions, with educational materials and handouts with cues and reminders from CIR staff. Outcome: Progressing

## 2020-04-02 ENCOUNTER — Inpatient Hospital Stay (HOSPITAL_COMMUNITY): Payer: Medicare HMO | Admitting: Occupational Therapy

## 2020-04-02 ENCOUNTER — Inpatient Hospital Stay (HOSPITAL_COMMUNITY): Payer: Medicare HMO

## 2020-04-02 MED ORDER — TRAMADOL HCL 50 MG PO TABS
50.0000 mg | ORAL_TABLET | Freq: Four times a day (QID) | ORAL | Status: DC | PRN
Start: 2020-04-02 — End: 2020-04-03
  Administered 2020-04-02 – 2020-04-03 (×2): 50 mg via ORAL
  Filled 2020-04-02 (×2): qty 1

## 2020-04-02 MED ORDER — HYDROCODONE-ACETAMINOPHEN 5-325 MG PO TABS
1.0000 | ORAL_TABLET | Freq: Four times a day (QID) | ORAL | Status: DC | PRN
  Administered 2020-04-02 – 2020-04-03 (×2): 1 via ORAL
  Filled 2020-04-02 (×4): qty 1
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
## 2020-04-02 NOTE — Plan of Care (Signed)
  Problem: Consults Goal: RH BRAIN INJURY PATIENT EDUCATION Description: Description: See Patient Education module for eduction specifics Outcome: Progressing Goal: Skin Care Protocol Initiated - if Braden Score 18 or less Description: If consults are not indicated, leave blank or document N/A Outcome: Progressing Goal: Nutrition Consult-if indicated Outcome: Progressing   Problem: RH BOWEL ELIMINATION Goal: RH STG MANAGE BOWEL WITH ASSISTANCE Description: STG Manage Bowel with supervision Assistance. Outcome: Progressing Goal: RH STG MANAGE BOWEL W/MEDICATION W/ASSISTANCE Description: STG Manage Bowel with Medication with supervision Assistance. Outcome: Progressing   Problem: RH BLADDER ELIMINATION Goal: RH STG MANAGE BLADDER WITH ASSISTANCE Description: STG Manage Bladder With supervision Assistance Outcome: Progressing   Problem: RH SKIN INTEGRITY Goal: RH STG SKIN FREE OF INFECTION/BREAKDOWN Description: Skin to remain free from infection and breakdown with supervision assist. Outcome: Progressing Goal: RH STG MAINTAIN SKIN INTEGRITY WITH ASSISTANCE Description: STG Maintain Skin Integrity With supervision Assistance. Outcome: Progressing Goal: RH STG ABLE TO PERFORM INCISION/WOUND CARE W/ASSISTANCE Description: STG Able To Perform Incision/Wound Care With supervision Assistance. Outcome: Progressing   Problem: RH SAFETY Goal: RH STG ADHERE TO SAFETY PRECAUTIONS W/ASSISTANCE/DEVICE Description: STG Adhere to Safety Precautions With supervision Assistance/Device. Outcome: Progressing Goal: RH STG DECREASED RISK OF FALL WITH ASSISTANCE Description: STG Decreased Risk of Fall With supervision Assistance. Outcome: Progressing   Problem: RH COGNITION-NURSING Goal: RH STG USES MEMORY AIDS/STRATEGIES W/ASSIST TO PROBLEM SOLVE Description: STG Uses Memory Aids/Strategies With cues and reminders Assistance to Problem Solve. Outcome: Progressing Goal: RH STG ANTICIPATES  NEEDS/CALLS FOR ASSIST W/ASSIST/CUES Description: STG Anticipates Needs/Calls for Assist With supervision Assistance/Cues. Outcome: Progressing   Problem: RH PAIN MANAGEMENT Goal: RH STG PAIN MANAGED AT OR BELOW PT'S PAIN GOAL Description: <4 on a 0-10 pain scale Outcome: Progressing   Problem: RH KNOWLEDGE DEFICIT BRAIN INJURY Goal: RH STG INCREASE KNOWLEDGE OF SELF CARE AFTER BRAIN INJURY Description: Patient will demonstrate knowledge of [place] levels, medication management, fall precautions, safety precautions, with educational materials and handouts with cues and reminders from CIR staff. Outcome: Progressing   

## 2020-04-02 NOTE — Progress Notes (Signed)
Occupational Therapy Session Note  Patient Details  Name: Emily Rice MRN: [id number] Date of Birth: [birth year]  Today's Date: 04/02/2020 OT Individual Time: 2536-6440 OT Individual Time Calculation (min): 45 min   Short Term Goals: Week 4:  OT Short Term Goal 1 (Week 4): STG=LTG d/t ELOS  Skilled Therapeutic Interventions/Progress Updates:    Patient greeted semi-reclined in bed and agreeable to OT treatment session focused on self-care care retraining. Pt wanted to shower today. Pt ambulated without AD and close supervision with intermittent CGA. Pt needed verbal cues for safety when reaching into dresser to collect clothing. Pt with decreased awareness of safety and needed cues throughout session. Bathing completed in shower with verbal cues to sit down for shower and to sit down to wash feet as she attempted to wash them in standing and was too unsteady. Pt stated "that is a much better idea." Pt completed dressing from EOB with close supervision and increased time. Pt left seated in recliner at end of session with alarm belt on, call bell in reach, and needs met.  Therapy Documentation Precautions:  Precautions Precautions: Fall Required Braces or Orthoses: Sling Restrictions Weight Bearing Restrictions: Yes RUE Weight Bearing: Non weight bearing Other Position/Activity Restrictions: Sling with activity for comfort, no ROM restrictions Pain: Pain Assessment Pain Scale: 0-10 Pain Score: 0-No pain   Therapy/Group: Individual Therapy  Valma Cava 04/02/2020, 11:00 AM

## 2020-04-03 MED ORDER — TRAMADOL HCL 50 MG PO TABS
50.0000 mg | ORAL_TABLET | Freq: Four times a day (QID) | ORAL | Status: DC | PRN
  Administered 2020-04-04: 50 mg via ORAL
  Filled 2020-04-03 (×2): qty 1
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
## 2020-04-04 ENCOUNTER — Inpatient Hospital Stay (HOSPITAL_COMMUNITY): Payer: Medicare HMO

## 2020-04-04 MED ORDER — LAMOTRIGINE 200 MG PO TABS: 200.0000 mg | ORAL_TABLET | Freq: Two times a day (BID) | ORAL | 0 refills | Status: DC

## 2020-04-04 MED ORDER — TRAZODONE HCL 50 MG PO TABS: 50.0000 mg | ORAL_TABLET | Freq: Every evening | ORAL | 0 refills | Status: DC | PRN

## 2020-04-04 MED ORDER — CHOLECALCIFEROL 75 MCG (3000 UT) PO TABS
3000.0000 [IU] | ORAL_TABLET | Freq: Every day | ORAL | 0 refills | Status: AC
Start: 2020-04-04 — End: ?

## 2020-04-04 MED ORDER — TOPIRAMATE 25 MG PO TABS: 25.0000 mg | ORAL_TABLET | Freq: Two times a day (BID) | ORAL | 0 refills | Status: DC

## 2020-04-04 MED ORDER — HYDROCODONE-ACETAMINOPHEN 5-325 MG PO TABS: 1.0000 | ORAL_TABLET | Freq: Four times a day (QID) | ORAL | 0 refills | Status: DC | PRN

## 2020-04-04 MED ORDER — SUMATRIPTAN SUCCINATE 50 MG PO TABS: 50.0000 mg | ORAL_TABLET | ORAL | 0 refills | Status: DC | PRN

## 2020-04-04 MED ORDER — ESCITALOPRAM OXALATE 5 MG PO TABS: 5.0000 mg | ORAL_TABLET | Freq: Every day | ORAL | 0 refills | Status: DC

## 2020-04-04 MED ORDER — DICLOFENAC SODIUM 1 % EX GEL
2.0000 g | Freq: Four times a day (QID) | CUTANEOUS | 0 refills | Status: DC
Start: 2020-04-04 — End: 2020-09-27

## 2020-04-04 MED ORDER — QUETIAPINE FUMARATE 100 MG PO TABS
100.0000 mg | ORAL_TABLET | Freq: Every day | ORAL | 0 refills | Status: DC
Start: 2020-04-04 — End: 2020-04-05

## 2020-04-04 MED ORDER — METHOCARBAMOL 500 MG PO TABS: 500.0000 mg | ORAL_TABLET | Freq: Three times a day (TID) | ORAL | 0 refills | Status: DC | PRN

## 2020-04-04 MED ORDER — ALPRAZOLAM 1 MG PO TABS: 1.0000 mg | ORAL_TABLET | Freq: Three times a day (TID) | ORAL | 0 refills | Status: DC

## 2020-04-04 MED ORDER — ACETAMINOPHEN 325 MG PO TABS: 650.0000 mg | ORAL_TABLET | Freq: Four times a day (QID) | ORAL | Status: AC | PRN

## 2020-04-04 MED ORDER — QUETIAPINE FUMARATE 50 MG PO TABS
100.0000 mg | ORAL_TABLET | Freq: Every day | ORAL | Status: DC
  Administered 2020-04-04: 100 mg via ORAL
  Filled 2020-04-04: qty 2

## 2020-04-04 MED ORDER — POLYETHYLENE GLYCOL 3350 17 G PO PACK: 17.0000 g | PACK | Freq: Two times a day (BID) | ORAL | 0 refills | Status: AC

## 2020-04-04 MED ORDER — ADULT MULTIVITAMIN W/MINERALS CH: 1.0000 | ORAL_TABLET | Freq: Every day | ORAL | Status: AC

## 2020-04-04 MED ORDER — CARBIDOPA-LEVODOPA 25-100 MG PO TABS: 1.0000 | ORAL_TABLET | Freq: Three times a day (TID) | ORAL | 0 refills | Status: DC

## 2020-04-04 MED ORDER — VITAMIN D (ERGOCALCIFEROL) 50 MCG (2000 UT) PO CAPS: 2000.0000 [IU] | ORAL_CAPSULE | Freq: Every day | ORAL | 0 refills | Status: DC

## 2020-04-04 MED ORDER — QUETIAPINE FUMARATE 100 MG PO TABS
100.0000 mg | ORAL_TABLET | Freq: Every day | ORAL | 0 refills | Status: DC
Start: 2020-04-04 — End: 2022-01-08

## 2020-04-04 MED ORDER — SENNOSIDES-DOCUSATE SODIUM 8.6-50 MG PO TABS: 2.0000 | ORAL_TABLET | Freq: Every day | ORAL | Status: DC

## 2020-04-04 MED ORDER — APIXABAN 5 MG PO TABS: 5.0000 mg | ORAL_TABLET | Freq: Two times a day (BID) | ORAL | 0 refills | Status: DC

## 2020-04-04 NOTE — Progress Notes (Signed)
Speech Language Pathology Discharge Summary  Patient Details  Name: Emily Rice MRN: [id number] Date of Birth: 1960/08/21  Today's Date: 04/04/2020 SLP Individual Time: 7482-7078 SLP Individual Time Calculation (min): 42 min   Skilled Therapeutic Interventions:  Skilled ST services focused on cognitive skills. SLP adminstered portions of the Cognistat, inwhich pt demonstrated severe deficits in construction task and delayed recall, moderate deficits in attention (likely due to memory) and mild deficits in reasoning and confrontation naming. Pt demonstrated reduced ability to organize thought in picture description task with little awareness. SLP completed education with pt. All questions answered to satisfaction. Pt was left with call bell within reach and bed alarm set.    Patient has met 5 of 5 long term goals.  Patient to discharge at [facility] level.  Reasons goals not met:     Clinical Impression/Discharge Summary:   Pt made progress meeting 5 out 5 goals. Pt demonstrated improvements in cognitive linguistic and speech skills. Pt demonstrated improvement in articulation errors and functional mildly-complex problem solving. Pt continues to demonstrate deficits in language with organized thought, complex problem solving, emergent awareness, selective attention and short term recall. Pt benefited from skilled ST services to maximize functional independence and reduce burden of care requiring 24 hour supervision and continued ST services.  Care Partner:  Caregiver Able to Provide Assistance: Yes  Type of Caregiver Assistance: Physical;Cognitive  Recommendation:  24 hour supervision/assistance;Home Health SLP  Rationale for SLP Follow Up: Maximize cognitive function and independence;Maximize functional communication;Reduce caregiver burden   Equipment: N/A   Reasons for discharge: Discharged from hospital   Patient/Family Agrees with Progress Made and Goals Achieved: Yes     Patric Buckhalter  [facility] 04/04/2020, 5:05 PM

## 2020-04-04 NOTE — Progress Notes (Signed)
Physical Therapy Discharge Summary  Patient Details  Name: Emily Rice MRN: [id number] Date of Birth: 10/13/1960  Today's Date: 04/04/2020 PT Individual Time: 0900-1010 PT Individual Time Calculation (min): 70 min    Patient has met 11 of 11 long term goals due to improved activity tolerance, improved balance, improved postural control, increased strength, decreased pain, ability to compensate for deficits, improved attention, improved awareness and improved coordination.  Patient to discharge at an ambulatory level CGA-close supervision.   Patient's care partner is independent to provide the necessary physical and cognitive assistance at discharge.  Reasons goals not met: n/a  Recommendation:  Patient will benefit from ongoing skilled PT services in outpatient setting to continue to advance safe functional mobility, address ongoing impairments in balance, strength, R UE ROM, activity tolerance, functional mobility, community integration, attention, safety awareness, patient/caregiver education, and minimize fall risk.  Equipment: No equipment provided  Reasons for discharge: treatment goals met  Patient/family agrees with progress made and goals achieved: Yes  Skilled Therapeutic Interventions: Patient in bed upon PT arrival. Patient alert and agreeable to PT session. Patient reported 6-7/10 R arm pain and 4/10 L knee pain, noted small bruising over patellar tendon without joint laxity or other deformity, during session, RN made aware and provided pain meds during session. PT provided repositioning, rest breaks, and distraction as pain interventions throughout session.   Orthostatic Vitals: Patient with dizziness when sitting up this morning, applied thigh high TEDs and abdominal binder. Sitting: BP 132/77, HR 72, SPO2 100% Standing: BP 124/72, HR 83, SPO2 100% Standing x3 min: BP 114/82, HR 86, SPO2 100% (symptomatic, reporting "dizziness") After ambulation: BP 122/87, HR 72  (asymptomatic) Recommending use of TEDs and binder at d/c for BP management. Educated on monitoring symptoms, taking time between changes in position (lying<>sitting, sitting<>standing), and to have supervision with CGA with mobility for safety for balance and due to orthostasis, patient stated understanding. Provided d/c instructions for patient's husband.   Therapeutic Activity: Bed Mobility: Patient performed rolling R/L with supervision for maintaining NWB with R upper extremity, poor tolerance on R due to R arm pain. She performed supine to sit with min-mod A x1 due to sudden onset of severe dizziness. Resolved in lying and applied TED hose with total A bed level. On second attempt patient performed supine to/from sit with supervision to the L without dizziness. Transfers: Patient performed sit to/from stand with supervision throughout session. Provided verbal cues for waiting ~30 sec before ambulating to assess symptom response in standing for safety. Patient reported feeling dizzy x2 during session, improved with increased mobility.  Gait Training:  6 Min Walk Test:  Instructed patient to ambulate as quickly and as safely as possible for 6 minutes using LRAD. Patient was allowed to take standing rest breaks without stopping the test, but if they required a sitting rest break the clock would be stopped and the test would be over.  Results: 0623 feet without AD with CGA for safety without LOB, RPE 4/10 after, vitals as above  Patient in recliner at end of session with breaks locked, seat belt alarm set, and all needs within reach.     Returned at lunch to provide family education to patient's husband. Provided handout for PT recommendations and HEP. Patient and her husband in agreement for recommendations for 24/7 supervision and CGA for gait and stairs, use of TEDs and abdominal binder for BP management, and safety awareness with monitoring of symptoms at home to prevent falls with  dizziness/light headedness.  Patient and her husband in agreement with all recommendations and education.   PT Discharge Precautions/Restrictions Precautions Precautions: Fall Required Braces or Orthoses: Sling Restrictions Weight Bearing Restrictions: Yes RUE Weight Bearing: Non weight bearing Vision/Perception  Vision - Assessment Eye Alignment: Within Functional Limits Ocular Range of Motion: Within Functional Limits Tracking/Visual Pursuits: Decreased smoothness of horizontal tracking;Decreased smoothness of vertical tracking Saccades: Decreased speed of saccadic movement Perception Inattention/Neglect: Does not attend to right side of body (very mild and improved with compensatory strategies with cues) Praxis Praxis: Intact  Cognition Overall Cognitive Status: Impaired/Different from baseline Arousal/Alertness: Awake/alert Orientation Level: Oriented X4 Attention: Sustained Sustained Attention: Impaired Sustained Attention Impairment: Functional complex Memory: Impaired Memory Impairment: Decreased recall of new information Awareness: Impaired Awareness Impairment: Emergent impairment Problem Solving: Impaired Problem Solving Impairment: Verbal basic;Functional complex Safety/Judgment: Impaired [employer] Scales of Cognitive Functioning: Purposeful/appropriate Sensation Sensation Light Touch: Impaired Detail Light Touch Impaired Details: Impaired RUE (numbness tingling in R arm and hand) Proprioception: Appears Intact Coordination Gross Motor Movements are Fluid and Coordinated: No Fine Motor Movements are Fluid and Coordinated: No Coordination and Movement Description: Mild R inattention and balance deficits, decreased safety awareness/attention leading to LOB with functional mobility Heel Shin Test: [facility] Motor  Motor Motor: Hemiplegia;Abnormal postural alignment and control Motor - Skilled Clinical Observations: decreased balance and safety awareness, mild  R inattention and hemiplegia, decreased movement/strength R UE consistent with radial nerve injury  Mobility Bed Mobility Bed Mobility: Rolling Right;Rolling Left;Supine to Sit;Sit to Supine Rolling Right: Supervision/verbal cueing Rolling Left: Supervision/Verbal cueing Supine to Sit: Supervision/Verbal cueing Sit to Supine: Supervision/Verbal cueing Transfers Transfers: Sit to Stand;Stand to Sit;Stand Pivot Transfers Sit to Stand: Supervision/Verbal cueing Stand to Sit: Supervision/Verbal cueing Stand Pivot Transfers: Supervision/Verbal cueing Transfer (Assistive device): None Locomotion  Gait Ambulation: Yes Gait Assistance: Contact Guard/Touching assist;Supervision/Verbal cueing Assistive device: None Gait Assistance Details: Verbal cues for precautions/safety;Verbal cues for technique;Verbal cues for gait pattern Gait Gait: Yes Gait Pattern: Step-through pattern;Decreased hip/knee flexion - right;Lateral hip instability;Decreased trunk rotation;Narrow base of support Gait velocity: decreased Stairs / Additional Locomotion Stairs: Yes Stairs Assistance: Contact Guard/Touching assist Stair Management Technique: One rail Left;Step to pattern Number of Stairs: 12 Height of Stairs: 6 Ramp: Contact Guard/touching assist Curb: Contact Guard/Touching assist Wheelchair Mobility 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, petroleum)  Trunk/Postural Assessment  Cervical Assessment Cervical Assessment: Exceptions to [facility] (forward head) Thoracic Assessment Thoracic Assessment: Exceptions to [facility] (rounded shoulders) Lumbar Assessment Lumbar Assessment: Exceptions to [facility] (posterior pelvic tilt) Postural Control Postural Control: Deficits on evaluation (delayed, inadequate)  Balance Standardized Balance Assessment Standardized Balance Assessment: Berg Balance Test Berg Balance Test Sit to Stand: Able to stand without using hands and stabilize independently Standing  Unsupported: Able to stand safely 2 minutes Sitting with Back Unsupported but Feet Supported on Floor or Stool: Able to sit safely and securely 2 minutes Stand to Sit: Sits safely with minimal use of hands Transfers: Able to transfer safely, minor use of hands Standing Unsupported with Eyes Closed: Able to stand 10 seconds safely Standing Ubsupported with Feet Together: Able to place feet together independently and stand 1 minute safely From Standing, Reach Forward with Outstretched Arm: Can reach forward >12 cm safely (5") From Standing Position, Pick up Object from Floor: Able to pick up shoe, needs supervision From Standing Position, Turn to Look Behind Over each Shoulder: Turn sideways only but maintains balance Turn 360 Degrees: Able to turn 360 degrees safely but slowly Standing Unsupported, Alternately Place Feet  on Step/Stool: Able to complete >2 steps/needs minimal assist Standing Unsupported, One Foot in Front: Able to place foot tandem independently and hold 30 seconds Standing on One Leg: Able to lift leg independently and hold 5-10 seconds (R leg) Total Score: 46 Static Sitting Balance Static Sitting - Level of Assistance: 7: Independent Dynamic Sitting Balance Dynamic Sitting - Level of Assistance: 7: Independent Static Standing Balance Static Standing - Level of Assistance: 5: Stand by assistance Dynamic Standing Balance Dynamic Standing - Balance Support: No upper extremity supported;During functional activity Dynamic Standing - Level of Assistance: 5: Stand by assistance Extremity Assessment  RLE Assessment RLE Assessment: Within Functional Limits Active Range of Motion (AROM) Comments: WFL, tight hamstrings General Strength Comments: Grossly in sitting 4+/5 to 5/5 throughout LLE Assessment LLE Assessment: Within Functional Limits Active Range of Motion (AROM) Comments: WFL, tight hamstring with muscle pain (improved throughout admission) General Strength Comments:  Grossly in sitting 4+/5 to 5/5 throughout    Mellony Danziger L Queena Monrreal PT, DPT  04/04/2020, 12:13 PM

## 2020-04-04 NOTE — Progress Notes (Signed)
Patient ID: Emily Rice, female   DOB: [birth year], 60 y.o.   MRN: [id number]  SW spoke with pt husband Hinda Kehr to review d/c. Husband amenable to [facility] as it is located in [place]. He reports pt will be a late pick up tomorrow around 1:30pm. SW encouraged him to discuss many of his medical questions with the PA tomorrow when they review discharge instructions.   Loralee Pacas, MSW, [place] Office: [phone] Cell: [phone] Fax: [phone]

## 2020-04-04 NOTE — Progress Notes (Signed)
   04/04/20 0649  What Happened  Was fall witnessed? No  Was patient injured? No  Patient found on floor  Found by Staff-comment  Stated prior activity other (comment) (pt states she slid off the bed.)  Follow Up  MD notified Linna Hoff, [place]  Time MD notified [phone]  Family notified No - patient refusal  Additional tests No  Adult Fall Risk Assessment  Risk Factor Category (scoring not indicated) High fall risk per protocol (document High fall risk)  Age 60  Fall History: Fall within 6 months prior to admission 5  Elimination; Bowel and/or Urine Incontinence 0  Elimination; Bowel and/or Urine Urgency/Frequency 2  Medications: includes PCA/Opiates, Anti-convulsants, Anti-hypertensives, Diuretics, Hypnotics, Laxatives, Sedatives, and Psychotropics 3  Patient Care Equipment 0  Mobility-Assistance 2  Mobility-Gait 2  Mobility-Sensory Deficit 0  Altered awareness of immediate physical environment 1  Impulsiveness 2  Lack of understanding of one's physical/cognitive limitations 4  Total Score 21  Patient Fall Risk Level High fall risk  Adult Fall Risk Interventions  Required Bundle Interventions *See Row Information* High fall risk - low, moderate, and high requirements implemented  Additional Interventions Lap belt while in chair/wheelchair;Use of appropriate toileting equipment (bedpan, BSC, etc.)  Screening for Fall Injury Risk (To be completed on HIGH fall risk patients) - Assessing Need for Low Bed  Risk For Fall Injury- Low Bed Criteria Previous fall this admission  Will Implement Low Bed and Floor Mats No - Criteria no longer met for low bed  Specialty Low Bed Contraindicated Hip precautions  Screening for Fall Injury Risk (To be completed on HIGH fall risk patients who do not meet crieteria for Low Bed) - Assessing Need for Floor Mats Only  Risk For Fall Injury- Criteria for Floor Mats Confusion/dementia (+NuDESC, CIWA, TBI, etc.)  Will Implement Floor Mats Yes  Vitals  Temp (!) 97.5  F (36.4 C)  BP 112/77  MAP (mmHg) 88  BP Location Left Arm  BP Method Automatic  Patient Position (if appropriate) Sitting  Pulse Rate 81  Resp 20  Oxygen Therapy  SpO2 100 %  O2 Device Room Air  Pain Assessment  Pain Scale 0-10  Pain Score 0  PCA/Epidural/Spinal Assessment  Respiratory Pattern Regular;Unlabored  Neurological  Neuro (WDL) X  Level of Consciousness Alert  Orientation Level Oriented X4  Cognition Follows commands;Impulsive;Poor judgement;Poor safety awareness  Speech Clear  R Hand Grip Weak  L Hand Grip Strong  RUE Motor Response Purposeful movement  LUE Motor Response Purposeful movement  RLE Motor Response Purposeful movement  LLE Motor Response Purposeful movement  Neuro Symptoms Forgetful  Musculoskeletal  Musculoskeletal (WDL) X  Assistive Device Wheelchair  Generalized Weakness Yes  RUE Weight Bearing NWB  Musculoskeletal Details  RUE Limited movement  Integumentary  Integumentary (WDL) X  Abrasion Location Coccyx  Abrasion Intervention Other (Comment) (assessed. present before fall)  Ecchymosis Location Abdomen  Ecchymosis Location Orientation Lower  Ecchymosis Intervention Other (Comment) (assessed)    Pt states she was sitting on the side of the bed, and slid into the floor. Pt denies hitting her head, denies new pain. No signs of distress. Pt Bed had 4 rails up, and pt put down 1 of the rails, pts bed alarm was on and sounding when the pt was found by [facility], LPN. All VS WNL. Pt stated she did not want family notified. On coming shift notified;Gray, LPN, to continue post fall protocol.
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
## 2020-04-04 NOTE — Progress Notes (Addendum)
Occupational Therapy Discharge Summary  Patient Details  Name: Emily Rice MRN: [id number] Date of Birth: [birth year]  Today's Date: 04/04/2020 OT Individual Time: 1300-1420 OT Individual Time Calculation (min): 80 min    Patient has met 8 of 10 long term goals due to improved activity tolerance, improved balance, postural control, ability to compensate for deficits, functional use of  RIGHT upper extremity, improved attention, improved awareness and improved coordination.  Patient to discharge at overall Supervision level.  Patient's care partner is independent to provide the necessary cognitive assistance at discharge.  Family education has been completed.   Reasons goals not met: Pt still requires min cueing for selective attention during ADLs. She also has inconsistently been performing LB dressing at supervision level and often still requires min A.   Recommendation:  Patient will benefit from ongoing skilled OT services in home health setting to continue to advance functional skills in the area of BADL.  Equipment: shower chair  Reasons for discharge: treatment goals met and discharge from hospital  Patient/family agrees with progress made and goals achieved: Yes   Skilled OT Intervention: Session focused on family education with pt's husband Nathen May. Pt completed shower and toileting at supervision level with [facility] providing assist and supervision. TBI edu and safety/fall risk reduction edu provided throughout session. Monitored BP throughout session with and without support garments- no change in BP to indicate OH this session. Transfers at supervision level throughout. Provided examples of HEP for her RUE, with husband recording on his phone to ensure carryover. Pt left sitting up with husband present, all needs met.   OT Discharge Precautions/Restrictions  Precautions Precautions: Fall Required Braces or Orthoses: Sling Restrictions RUE Weight Bearing: Non weight  bearing Other Position/Activity Restrictions: Sling with activity for comfort, no ROM restrictions   Vital Signs Therapy Vitals Temp: 98.1 F (36.7 C) Temp Source: Oral Pulse Rate: 62 Resp: 16 BP: 112/84 Patient Position (if appropriate): Sitting Oxygen Therapy SpO2: 100 % O2 Device: Room Air Pain Pain Assessment Pain Scale: 0-10 Pain Score: 0-No pain ADL ADL Eating: Set up Where Assessed-Eating: Edge of bed Grooming: Supervision/safety Where Assessed-Grooming: Sitting at sink Upper Body Bathing: Supervision/safety Where Assessed-Upper Body Bathing: Shower Lower Body Bathing: Supervision/safety Where Assessed-Lower Body Bathing: Shower Upper Body Dressing: Supervision/safety Where Assessed-Upper Body Dressing: Edge of bed Lower Body Dressing: Contact guard Where Assessed-Lower Body Dressing: Edge of bed Toileting: Supervision/safety Where Assessed-Toileting: Medical laboratory scientific officer: Close supervision Armed forces technical officer Method: Human resources officer Method: Unable to assess Social research officer, government: Close supervision Social research officer, government Method: Heritage manager: Shower seat without back Vision Baseline Vision/History: Wears glasses Wears Glasses: At all times Patient Visual Report: No change from baseline Vision Assessment?: Yes Eye Alignment: Within Functional Limits Ocular Range of Motion: Within Functional Limits Alignment/Gaze Preference: Within Defined Limits Tracking/Visual Pursuits: Decreased smoothness of horizontal tracking;Decreased smoothness of vertical tracking Saccades: Decreased speed of saccadic movement Perception  Perception: Impaired Inattention/Neglect: Does not attend to right side of body (much improved) Praxis Praxis: Impaired Praxis Impairment Details: Motor planning (much improved, still some higher level deficits) Cognition Overall Cognitive Status: Impaired/Different from baseline Arousal/Alertness:  Awake/alert Orientation Level: Oriented X4 Attention: Sustained Sustained Attention: Impaired Sustained Attention Impairment: Functional complex Memory: Impaired Memory Impairment: Decreased recall of new information Awareness: Impaired Awareness Impairment: Emergent impairment Problem Solving: Impaired Problem Solving Impairment: Verbal basic;Functional complex Safety/Judgment: Impaired [employer] Scales of Cognitive Functioning: Purposeful/appropriate Sensation Sensation Light Touch: Impaired Detail Light Touch Impaired Details: Impaired RUE Hot/Cold: Appears  Intact Proprioception: Appears Intact Coordination Gross Motor Movements are Fluid and Coordinated: No Fine Motor Movements are Fluid and Coordinated: No Coordination and Movement Description: Mild R inattention and balance deficits, decreased safety awareness/attention leading to LOB with functional mobility Heel Shin Test: [facility] Motor  Motor Motor: Hemiplegia;Abnormal postural alignment and control Motor - Skilled Clinical Observations: decreased balance and safety awareness, mild R inattention and hemiplegia, decreased movement/strength R UE consistent with radial nerve injury Mobility  Bed Mobility Bed Mobility: Rolling Right;Rolling Left;Supine to Sit;Sit to Supine Rolling Right: Supervision/verbal cueing Rolling Left: Supervision/Verbal cueing Supine to Sit: Supervision/Verbal cueing Sit to Supine: Supervision/Verbal cueing Transfers Sit to Stand: Supervision/Verbal cueing Stand to Sit: Supervision/Verbal cueing  Trunk/Postural Assessment  Cervical Assessment Cervical Assessment: Exceptions to [facility] (forward head) Thoracic Assessment Thoracic Assessment: Exceptions to [facility] (rounded shoulders) Lumbar Assessment Lumbar Assessment: Exceptions to [facility] (posterior pelvic tilt) Postural Control Postural Control: Deficits on evaluation (delayed)  Balance Balance Balance Assessed: Yes Static Sitting  Balance Static Sitting - Balance Support: No upper extremity supported;Feet supported Static Sitting - Level of Assistance: 7: Independent Dynamic Sitting Balance Dynamic Sitting - Balance Support: During functional activity Dynamic Sitting - Level of Assistance: 7: Independent Static Standing Balance Static Standing - Balance Support: During functional activity;No upper extremity supported Static Standing - Level of Assistance: 5: Stand by assistance Dynamic Standing Balance Dynamic Standing - Balance Support: No upper extremity supported;During functional activity Dynamic Standing - Level of Assistance: 5: Stand by assistance Extremity/Trunk Assessment RUE Assessment RUE Assessment: Exceptions to [facility] Active Range of Motion (AROM) Comments: 15 degrees of shoulder flexion and abduction. About 10 degrees of extension out of full fist actively. Full bicep and tricep LUE Assessment LUE Assessment: Within Functional Limits   Curtis Sites 04/04/2020, 3:12 PM
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
## 2020-04-05 DIAGNOSIS — S42334S Nondisplaced oblique fracture of shaft of humerus, right arm, sequela: Secondary | ICD-10-CM

## 2020-04-05 MED ORDER — TRAMADOL HCL 50 MG PO TABS: 50.0000 mg | ORAL_TABLET | Freq: Four times a day (QID) | ORAL | 0 refills | Status: DC | PRN
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
## 2020-04-05 NOTE — Plan of Care (Signed)
  Problem: RH SAFETY Goal: RH STG ADHERE TO SAFETY PRECAUTIONS W/ASSISTANCE/DEVICE Description: STG Adhere to Safety Precautions With supervision Assistance/Device. Outcome: Not Met (add Reason) min assist-impulsive Goal: RH STG DECREASED RISK OF FALL WITH ASSISTANCE Description: STG Decreased Risk of Fall With supervision Assistance. Outcome: Not Met (add Reason) min assist

## 2020-04-05 NOTE — Progress Notes (Signed)
Pt bed alarm sounded, pt was attempting to get out of the bed. Pt had incont episode requiring a full linen change. Pt unaware of bed being wet. Pt as assisted to the restroom. Pt denies dizziness while ambulating, but appears to be confused. Pt oriented to person/place/situation. Pt assisted back to bed. Refused the RUE splint. Bed alarm set on the middle setting

## 2020-04-05 NOTE — Progress Notes (Signed)
Patient discharged to home with all belongings and discharge instructions given via Silvestre Mesi PA. Husband present for instructions and all questions answered. Patient discharged at 1400.

## 2020-04-07 ENCOUNTER — Telehealth: Payer: Self-pay | Admitting: Registered Nurse

## 2020-04-07 NOTE — Telephone Encounter (Signed)
Transitional Care call  TC Call Placed on 04/07/2020 at 11:00: No Answer. Left message to return the call.   Patient name: (                                                          ) DOB: (                                   ) 1. Are you/is patient experiencing any problems since coming home? (                            ) a. Are there any questions regarding any aspect of care? (                            ) 2. Are there any questions regarding medications administration/dosing? (                            ) a. Are meds being taken as prescribed? (                            ) b. "Patient should review meds with caller to confirm"  3. Have there been any falls? (                      ) 4. Has Home Health been to the house and/or have they contacted you? (                            ) a. If not, have you tried to contact them? (                              ) b. Can we help you contact them? (                               ) 5. Are bowels and bladder emptying properly? (                        ) a. Are there any unexpected incontinence issues? (                                  ) b. If applicable, is patient following bowel/bladder programs? (                          ) 6. Any fevers, problems with breathing, unexpected pain? (                                ) 7. Are there any skin problems or new areas of breakdown? (                                   )  8. Has the patient/family member arranged specialty MD follow up (ie cardiology/neurology/renal/surgical/etc.)?  (                                   ) a. Can we help arrange? (                                     ) 9. Does the patient need any other services or support that we can help arrange? (                           ) 10. Are caregivers following through as expected in assisting the patient? (                             ) 11. Has the patient quit smoking, drinking alcohol, or using drugs as recommended? (                         )  Appointment date/time (                                  ),  arrival time (                                    )  and who it is with here (                                       ) [place]

## 2020-04-08 ENCOUNTER — Telehealth: Payer: Self-pay

## 2020-04-08 NOTE — Telephone Encounter (Signed)
Transition Care Management Unsuccessful Follow-up Telephone Call  Date of discharge and from where:  [facility]/05/[facility]  Attempts:  1  Reason for unsuccessful TCM follow-up call:  No return call

## 2020-04-08 NOTE — Telephone Encounter (Signed)
Mr. Lingo is concern that his wife maybe having an issue with her medications. In the evenings she is very irritable, aggressive & lethargic. She sleeps so hard that she unknowingly voids on herself. He thinks the problem is with Xanax & Lexapro. And she is demands her medication.    Call back ph [phone].

## 2020-04-08 NOTE — Telephone Encounter (Signed)
I spoke to her husband. Will taper xanax over the next 10 days to 0.5mg  at bedtime. She'll continue lexapro. Can use seroquel 50mg  as a prn for agitation at night

## 2020-04-12 ENCOUNTER — Ambulatory Visit: Payer: Medicare HMO | Admitting: Speech Pathology

## 2020-04-12 ENCOUNTER — Encounter: Payer: Self-pay | Admitting: Physical Therapy

## 2020-04-12 ENCOUNTER — Ambulatory Visit: Payer: Medicare HMO | Admitting: Occupational Therapy

## 2020-04-12 ENCOUNTER — Encounter: Payer: Self-pay | Admitting: Speech Pathology

## 2020-04-12 ENCOUNTER — Ambulatory Visit: Payer: Medicare HMO | Attending: Physician Assistant | Admitting: Physical Therapy

## 2020-04-12 ENCOUNTER — Other Ambulatory Visit: Payer: Self-pay

## 2020-04-12 DIAGNOSIS — R41841 Cognitive communication deficit: Secondary | ICD-10-CM

## 2020-04-12 DIAGNOSIS — R4701 Aphasia: Secondary | ICD-10-CM | POA: Insufficient documentation

## 2020-04-12 DIAGNOSIS — M6281 Muscle weakness (generalized): Secondary | ICD-10-CM | POA: Diagnosis not present

## 2020-04-12 DIAGNOSIS — M79601 Pain in right arm: Secondary | ICD-10-CM | POA: Diagnosis not present

## 2020-04-12 DIAGNOSIS — R29818 Other symptoms and signs involving the nervous system: Secondary | ICD-10-CM | POA: Diagnosis not present

## 2020-04-12 DIAGNOSIS — R278 Other lack of coordination: Secondary | ICD-10-CM | POA: Insufficient documentation

## 2020-04-12 DIAGNOSIS — R296 Repeated falls: Secondary | ICD-10-CM | POA: Diagnosis not present

## 2020-04-12 DIAGNOSIS — R262 Difficulty in walking, not elsewhere classified: Secondary | ICD-10-CM | POA: Diagnosis not present

## 2020-04-12 DIAGNOSIS — M25562 Pain in left knee: Secondary | ICD-10-CM | POA: Diagnosis not present

## 2020-04-12 DIAGNOSIS — M25641 Stiffness of right hand, not elsewhere classified: Secondary | ICD-10-CM
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
## 2020-04-12 NOTE — Patient Instructions (Signed)
SLP spoke with pt and spouse aboutcoming up with a calendar for exercise together. Husband voices pt reluctance to do things outside of the house and her exercises. Pt agreed this may be beneficial in reducing frustration during the week.
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
## 2020-04-12 NOTE — Addendum Note (Signed)
Addended by: Verdene Lennert on: 04/12/2020 05:03 PM   Modules accepted: Orders
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
## 2020-04-13 ENCOUNTER — Encounter: Payer: Medicare HMO | Attending: Physical Medicine & Rehabilitation | Admitting: Physical Medicine & Rehabilitation

## 2020-04-13 DIAGNOSIS — I609 Nontraumatic subarachnoid hemorrhage, unspecified: Secondary | ICD-10-CM | POA: Insufficient documentation

## 2020-04-13 DIAGNOSIS — G43709 Chronic migraine without aura, not intractable, without status migrainosus: Secondary | ICD-10-CM | POA: Insufficient documentation

## 2020-04-13 DIAGNOSIS — I480 Paroxysmal atrial fibrillation: Secondary | ICD-10-CM | POA: Insufficient documentation

## 2020-04-13 DIAGNOSIS — Z86718 Personal history of other venous thrombosis and embolism: Secondary | ICD-10-CM | POA: Insufficient documentation

## 2020-04-14 ENCOUNTER — Ambulatory Visit: Payer: Medicare HMO | Admitting: Occupational Therapy

## 2020-04-14 ENCOUNTER — Ambulatory Visit: Payer: Medicare HMO | Admitting: Speech Pathology

## 2020-04-14 ENCOUNTER — Encounter: Payer: Self-pay | Admitting: Speech Pathology

## 2020-04-14 ENCOUNTER — Other Ambulatory Visit: Payer: Self-pay

## 2020-04-14 ENCOUNTER — Ambulatory Visit: Payer: Medicare HMO | Admitting: Physical Therapy

## 2020-04-14 DIAGNOSIS — R4701 Aphasia: Secondary | ICD-10-CM

## 2020-04-14 DIAGNOSIS — R278 Other lack of coordination: Secondary | ICD-10-CM

## 2020-04-14 DIAGNOSIS — R29818 Other symptoms and signs involving the nervous system: Secondary | ICD-10-CM | POA: Diagnosis not present

## 2020-04-14 DIAGNOSIS — M6281 Muscle weakness (generalized): Secondary | ICD-10-CM

## 2020-04-14 DIAGNOSIS — R262 Difficulty in walking, not elsewhere classified: Secondary | ICD-10-CM | POA: Diagnosis not present

## 2020-04-14 DIAGNOSIS — M25562 Pain in left knee: Secondary | ICD-10-CM | POA: Diagnosis not present

## 2020-04-14 DIAGNOSIS — R41841 Cognitive communication deficit: Secondary | ICD-10-CM

## 2020-04-14 DIAGNOSIS — M79601 Pain in right arm: Secondary | ICD-10-CM | POA: Diagnosis not present

## 2020-04-14 DIAGNOSIS — R296 Repeated falls: Secondary | ICD-10-CM | POA: Diagnosis not present

## 2020-04-14 DIAGNOSIS — M25641 Stiffness of right hand, not elsewhere classified: Secondary | ICD-10-CM | POA: Diagnosis not present

## 2020-04-14 NOTE — Patient Instructions (Signed)
On Sunday, sit down with Emily Rice to develop a schedule on a calendar to decrease frustration during the week when it is time to complete exercises. Put day, the time, and the activity.   Continue to utilize your "Expanding Expression Tool/ Semantic Feature Analysis" to assist with word finding to increase confidence.
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
## 2020-04-18 ENCOUNTER — Encounter: Payer: Self-pay | Admitting: Occupational Therapy

## 2020-04-18 ENCOUNTER — Ambulatory Visit: Payer: Medicare HMO | Admitting: Occupational Therapy

## 2020-04-18 ENCOUNTER — Encounter: Payer: Self-pay | Admitting: Physical Therapy

## 2020-04-18 ENCOUNTER — Ambulatory Visit: Payer: Medicare HMO | Admitting: Physical Therapy

## 2020-04-18 ENCOUNTER — Encounter: Payer: Self-pay | Admitting: Speech Pathology

## 2020-04-18 ENCOUNTER — Ambulatory Visit: Payer: Medicare HMO | Admitting: Speech Pathology

## 2020-04-18 ENCOUNTER — Encounter: Payer: Medicare HMO | Admitting: Occupational Therapy

## 2020-04-18 ENCOUNTER — Other Ambulatory Visit: Payer: Self-pay

## 2020-04-18 DIAGNOSIS — M25562 Pain in left knee: Secondary | ICD-10-CM | POA: Diagnosis not present

## 2020-04-18 DIAGNOSIS — R41841 Cognitive communication deficit: Secondary | ICD-10-CM

## 2020-04-18 DIAGNOSIS — R278 Other lack of coordination: Secondary | ICD-10-CM

## 2020-04-18 DIAGNOSIS — M25641 Stiffness of right hand, not elsewhere classified: Secondary | ICD-10-CM

## 2020-04-18 DIAGNOSIS — M79601 Pain in right arm: Secondary | ICD-10-CM

## 2020-04-18 DIAGNOSIS — R29818 Other symptoms and signs involving the nervous system: Secondary | ICD-10-CM

## 2020-04-18 DIAGNOSIS — R296 Repeated falls: Secondary | ICD-10-CM | POA: Diagnosis not present

## 2020-04-18 DIAGNOSIS — R262 Difficulty in walking, not elsewhere classified: Secondary | ICD-10-CM | POA: Diagnosis not present

## 2020-04-18 DIAGNOSIS — M6281 Muscle weakness (generalized): Secondary | ICD-10-CM

## 2020-04-18 DIAGNOSIS — R4701 Aphasia: Secondary | ICD-10-CM

## 2020-04-18 NOTE — Patient Instructions (Addendum)
Use your external memory aids to assist in success outside of the treatment room.   BRING YOUR FOLDER :)
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
## 2020-04-19 ENCOUNTER — Encounter: Payer: Medicare HMO | Admitting: Registered Nurse

## 2020-04-19 DIAGNOSIS — S42301D Unspecified fracture of shaft of humerus, right arm, subsequent encounter for fracture with routine healing: Secondary | ICD-10-CM | POA: Diagnosis not present

## 2020-04-19 DIAGNOSIS — S42201D Unspecified fracture of upper end of right humerus, subsequent encounter for fracture with routine healing: Secondary | ICD-10-CM | POA: Diagnosis not present

## 2020-04-20 ENCOUNTER — Ambulatory Visit: Payer: Medicare HMO | Admitting: Occupational Therapy

## 2020-04-20 ENCOUNTER — Encounter: Payer: Self-pay | Admitting: Physical Therapy

## 2020-04-20 ENCOUNTER — Encounter: Payer: Self-pay | Admitting: Occupational Therapy

## 2020-04-20 ENCOUNTER — Ambulatory Visit: Payer: Medicare HMO | Admitting: Physical Therapy

## 2020-04-20 ENCOUNTER — Encounter: Payer: Self-pay | Admitting: Speech Pathology

## 2020-04-20 ENCOUNTER — Other Ambulatory Visit: Payer: Self-pay

## 2020-04-20 ENCOUNTER — Ambulatory Visit: Payer: Medicare HMO | Admitting: Speech Pathology

## 2020-04-20 DIAGNOSIS — R278 Other lack of coordination: Secondary | ICD-10-CM

## 2020-04-20 DIAGNOSIS — M25641 Stiffness of right hand, not elsewhere classified: Secondary | ICD-10-CM

## 2020-04-20 DIAGNOSIS — M79601 Pain in right arm: Secondary | ICD-10-CM | POA: Diagnosis not present

## 2020-04-20 DIAGNOSIS — R262 Difficulty in walking, not elsewhere classified: Secondary | ICD-10-CM | POA: Diagnosis not present

## 2020-04-20 DIAGNOSIS — M6281 Muscle weakness (generalized): Secondary | ICD-10-CM

## 2020-04-20 DIAGNOSIS — R296 Repeated falls: Secondary | ICD-10-CM | POA: Diagnosis not present

## 2020-04-20 DIAGNOSIS — R29818 Other symptoms and signs involving the nervous system: Secondary | ICD-10-CM

## 2020-04-20 DIAGNOSIS — R4701 Aphasia: Secondary | ICD-10-CM

## 2020-04-20 DIAGNOSIS — M25562 Pain in left knee: Secondary | ICD-10-CM | POA: Diagnosis not present

## 2020-04-20 DIAGNOSIS — R41841 Cognitive communication deficit: Secondary | ICD-10-CM | POA: Diagnosis not present
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
## 2020-04-20 NOTE — Patient Instructions (Signed)
Identify and create routines that allow your brain a break at home!   Create a lunch at home - retrieve all items from the fridge/pantry before you begin to help with organization.

## 2020-04-21 ENCOUNTER — Encounter: Payer: Self-pay | Admitting: Registered Nurse

## 2020-04-21 ENCOUNTER — Encounter (HOSPITAL_BASED_OUTPATIENT_CLINIC_OR_DEPARTMENT_OTHER): Payer: Medicare HMO | Admitting: Registered Nurse

## 2020-04-21 VITALS — BP 105/71 | HR 67 | Temp 98.4°F | Ht 63.0 in | Wt 158.2 lb

## 2020-04-21 DIAGNOSIS — I480 Paroxysmal atrial fibrillation: Secondary | ICD-10-CM

## 2020-04-21 DIAGNOSIS — G43709 Chronic migraine without aura, not intractable, without status migrainosus: Secondary | ICD-10-CM | POA: Diagnosis not present

## 2020-04-21 DIAGNOSIS — Z86718 Personal history of other venous thrombosis and embolism: Secondary | ICD-10-CM | POA: Diagnosis not present

## 2020-04-21 DIAGNOSIS — I609 Nontraumatic subarachnoid hemorrhage, unspecified: Secondary | ICD-10-CM | POA: Diagnosis not present
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
## 2020-04-21 NOTE — Patient Instructions (Signed)
Call to Schedule hospital follow up appointment Judith Part, MD Follow up.   Specialty: Neurosurgery Why: Call for appointment Contact information: [place] [phone]   Call your Primary Care Physician to schedule Hospital Follow up appointment

## 2020-04-26 ENCOUNTER — Ambulatory Visit: Payer: Medicare HMO | Admitting: Cardiology

## 2020-04-27 ENCOUNTER — Encounter: Payer: Self-pay | Admitting: Speech Pathology

## 2020-04-27 ENCOUNTER — Other Ambulatory Visit: Payer: Self-pay

## 2020-04-27 ENCOUNTER — Ambulatory Visit: Payer: Medicare HMO | Admitting: Speech Pathology

## 2020-04-27 ENCOUNTER — Encounter: Payer: Self-pay | Admitting: Occupational Therapy

## 2020-04-27 ENCOUNTER — Encounter: Payer: Self-pay | Admitting: Physical Therapy

## 2020-04-27 ENCOUNTER — Telehealth: Payer: Self-pay | Admitting: Physical Medicine & Rehabilitation

## 2020-04-27 ENCOUNTER — Ambulatory Visit: Payer: Medicare HMO | Admitting: Occupational Therapy

## 2020-04-27 ENCOUNTER — Ambulatory Visit: Payer: Medicare HMO | Admitting: Physical Therapy

## 2020-04-27 DIAGNOSIS — R41841 Cognitive communication deficit: Secondary | ICD-10-CM

## 2020-04-27 DIAGNOSIS — M6281 Muscle weakness (generalized): Secondary | ICD-10-CM

## 2020-04-27 DIAGNOSIS — R4701 Aphasia: Secondary | ICD-10-CM

## 2020-04-27 DIAGNOSIS — R262 Difficulty in walking, not elsewhere classified: Secondary | ICD-10-CM

## 2020-04-27 DIAGNOSIS — M79601 Pain in right arm: Secondary | ICD-10-CM | POA: Diagnosis not present

## 2020-04-27 DIAGNOSIS — M25641 Stiffness of right hand, not elsewhere classified: Secondary | ICD-10-CM | POA: Diagnosis not present

## 2020-04-27 DIAGNOSIS — R296 Repeated falls: Secondary | ICD-10-CM

## 2020-04-27 DIAGNOSIS — M25562 Pain in left knee: Secondary | ICD-10-CM | POA: Diagnosis not present

## 2020-04-27 DIAGNOSIS — R29818 Other symptoms and signs involving the nervous system: Secondary | ICD-10-CM

## 2020-04-27 DIAGNOSIS — R278 Other lack of coordination: Secondary | ICD-10-CM
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
## 2020-04-27 NOTE — Telephone Encounter (Signed)
Would like referral to Psychiatrist who can prescribe meds

## 2020-04-27 NOTE — Patient Instructions (Signed)
Complete "Feature Information" task worksheet started in therapy.
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
## 2020-04-29 ENCOUNTER — Ambulatory Visit: Payer: Medicare HMO | Admitting: Speech Pathology

## 2020-04-29 ENCOUNTER — Encounter: Payer: Self-pay | Admitting: Speech Pathology

## 2020-04-29 ENCOUNTER — Ambulatory Visit: Payer: Medicare HMO | Admitting: Occupational Therapy

## 2020-04-29 ENCOUNTER — Ambulatory Visit: Payer: Medicare HMO

## 2020-04-29 ENCOUNTER — Encounter: Payer: Self-pay | Admitting: Occupational Therapy

## 2020-04-29 ENCOUNTER — Other Ambulatory Visit: Payer: Self-pay

## 2020-04-29 DIAGNOSIS — M6281 Muscle weakness (generalized): Secondary | ICD-10-CM

## 2020-04-29 DIAGNOSIS — M79601 Pain in right arm: Secondary | ICD-10-CM

## 2020-04-29 DIAGNOSIS — R296 Repeated falls: Secondary | ICD-10-CM | POA: Diagnosis not present

## 2020-04-29 DIAGNOSIS — R278 Other lack of coordination: Secondary | ICD-10-CM

## 2020-04-29 DIAGNOSIS — R4701 Aphasia: Secondary | ICD-10-CM

## 2020-04-29 DIAGNOSIS — M25641 Stiffness of right hand, not elsewhere classified: Secondary | ICD-10-CM | POA: Diagnosis not present

## 2020-04-29 DIAGNOSIS — R29818 Other symptoms and signs involving the nervous system: Secondary | ICD-10-CM

## 2020-04-29 DIAGNOSIS — R262 Difficulty in walking, not elsewhere classified: Secondary | ICD-10-CM

## 2020-04-29 DIAGNOSIS — R41841 Cognitive communication deficit: Secondary | ICD-10-CM | POA: Diagnosis not present

## 2020-04-29 DIAGNOSIS — M25562 Pain in left knee: Secondary | ICD-10-CM | POA: Diagnosis not present
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
## 2020-05-02 ENCOUNTER — Ambulatory Visit: Payer: Medicare HMO | Admitting: Cardiology

## 2020-05-02 ENCOUNTER — Other Ambulatory Visit: Payer: Self-pay

## 2020-05-02 ENCOUNTER — Encounter: Payer: Self-pay | Admitting: Cardiology

## 2020-05-02 ENCOUNTER — Telehealth (HOSPITAL_COMMUNITY): Payer: Self-pay | Admitting: Physical Medicine & Rehabilitation

## 2020-05-02 ENCOUNTER — Other Ambulatory Visit (HOSPITAL_COMMUNITY): Payer: Self-pay | Admitting: Physical Medicine & Rehabilitation

## 2020-05-02 VITALS — BP 138/80 | HR 61 | Temp 97.7°F | Resp 16 | Ht 63.0 in | Wt 159.0 lb

## 2020-05-02 DIAGNOSIS — R001 Bradycardia, unspecified: Secondary | ICD-10-CM | POA: Diagnosis not present

## 2020-05-02 DIAGNOSIS — I480 Paroxysmal atrial fibrillation: Secondary | ICD-10-CM

## 2020-05-02 DIAGNOSIS — Z86711 Personal history of pulmonary embolism: Secondary | ICD-10-CM | POA: Diagnosis not present

## 2020-05-02 DIAGNOSIS — F3131 Bipolar disorder, current episode depressed, mild: Secondary | ICD-10-CM

## 2020-05-02 DIAGNOSIS — Z7901 Long term (current) use of anticoagulants: Secondary | ICD-10-CM | POA: Diagnosis not present

## 2020-05-02 DIAGNOSIS — Z86718 Personal history of other venous thrombosis and embolism: Secondary | ICD-10-CM

## 2020-05-02 DIAGNOSIS — Z8249 Family history of ischemic heart disease and other diseases of the circulatory system: Secondary | ICD-10-CM

## 2020-05-02 MED ORDER — METOPROLOL SUCCINATE ER 25 MG PO TB24: 12.5000 mg | ORAL_TABLET | Freq: Every day | ORAL | 0 refills | Status: DC

## 2020-05-02 MED ORDER — ESCITALOPRAM OXALATE 10 MG PO TABS: 10.0000 mg | ORAL_TABLET | Freq: Every day | ORAL | 4 refills | Status: DC
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
## 2020-05-02 NOTE — Telephone Encounter (Signed)
I have taken care of referral.

## 2020-05-02 NOTE — Telephone Encounter (Signed)
Spoke with Mr. Bartek. Pt has been a little more irritable and at times depressed. I increased lexapro and sent to her pharmacy. Made a referral to psychiatry also. Could we fax to Dr.Rupinder [facility]? I have card copy in my office at hospital.  Thanks!

## 2020-05-04 ENCOUNTER — Encounter: Payer: Self-pay | Admitting: Occupational Therapy

## 2020-05-04 ENCOUNTER — Other Ambulatory Visit: Payer: Self-pay

## 2020-05-04 ENCOUNTER — Ambulatory Visit: Payer: Medicare HMO

## 2020-05-04 ENCOUNTER — Ambulatory Visit: Payer: Medicare HMO | Admitting: Speech Pathology

## 2020-05-04 ENCOUNTER — Ambulatory Visit: Payer: Medicare HMO | Admitting: Occupational Therapy

## 2020-05-04 ENCOUNTER — Encounter: Payer: Self-pay | Admitting: Speech Pathology

## 2020-05-04 DIAGNOSIS — R29818 Other symptoms and signs involving the nervous system: Secondary | ICD-10-CM

## 2020-05-04 DIAGNOSIS — R296 Repeated falls: Secondary | ICD-10-CM

## 2020-05-04 DIAGNOSIS — M6281 Muscle weakness (generalized): Secondary | ICD-10-CM

## 2020-05-04 DIAGNOSIS — M25562 Pain in left knee: Secondary | ICD-10-CM

## 2020-05-04 DIAGNOSIS — M79601 Pain in right arm: Secondary | ICD-10-CM | POA: Diagnosis not present

## 2020-05-04 DIAGNOSIS — R262 Difficulty in walking, not elsewhere classified: Secondary | ICD-10-CM | POA: Diagnosis not present

## 2020-05-04 DIAGNOSIS — R41841 Cognitive communication deficit: Secondary | ICD-10-CM

## 2020-05-04 DIAGNOSIS — M25641 Stiffness of right hand, not elsewhere classified: Secondary | ICD-10-CM

## 2020-05-04 DIAGNOSIS — R278 Other lack of coordination: Secondary | ICD-10-CM | POA: Diagnosis not present

## 2020-05-04 DIAGNOSIS — R4701 Aphasia: Secondary | ICD-10-CM
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
## 2020-05-05 ENCOUNTER — Ambulatory Visit: Payer: Medicare HMO | Admitting: Occupational Therapy

## 2020-05-05 ENCOUNTER — Ambulatory Visit: Payer: Medicare HMO | Admitting: Physical Therapy

## 2020-05-05 ENCOUNTER — Encounter: Payer: Self-pay | Admitting: Physical Therapy

## 2020-05-05 ENCOUNTER — Ambulatory Visit: Payer: Medicare HMO | Admitting: Speech Pathology

## 2020-05-05 ENCOUNTER — Encounter: Payer: Self-pay | Admitting: Occupational Therapy

## 2020-05-05 ENCOUNTER — Encounter: Payer: Self-pay | Admitting: Speech Pathology

## 2020-05-05 DIAGNOSIS — M79601 Pain in right arm: Secondary | ICD-10-CM | POA: Diagnosis not present

## 2020-05-05 DIAGNOSIS — M25641 Stiffness of right hand, not elsewhere classified: Secondary | ICD-10-CM

## 2020-05-05 DIAGNOSIS — R4701 Aphasia: Secondary | ICD-10-CM

## 2020-05-05 DIAGNOSIS — R41841 Cognitive communication deficit: Secondary | ICD-10-CM | POA: Diagnosis not present

## 2020-05-05 DIAGNOSIS — R29818 Other symptoms and signs involving the nervous system: Secondary | ICD-10-CM

## 2020-05-05 DIAGNOSIS — M6281 Muscle weakness (generalized): Secondary | ICD-10-CM

## 2020-05-05 DIAGNOSIS — M25562 Pain in left knee: Secondary | ICD-10-CM

## 2020-05-05 DIAGNOSIS — R278 Other lack of coordination: Secondary | ICD-10-CM | POA: Diagnosis not present

## 2020-05-05 DIAGNOSIS — R296 Repeated falls: Secondary | ICD-10-CM

## 2020-05-05 DIAGNOSIS — R262 Difficulty in walking, not elsewhere classified: Secondary | ICD-10-CM | POA: Diagnosis not present
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
## 2020-05-10 ENCOUNTER — Ambulatory Visit: Payer: Medicare HMO | Attending: Physician Assistant

## 2020-05-10 ENCOUNTER — Other Ambulatory Visit: Payer: Self-pay

## 2020-05-10 ENCOUNTER — Ambulatory Visit: Payer: Medicare HMO | Admitting: Speech Pathology

## 2020-05-10 ENCOUNTER — Encounter: Payer: Self-pay | Admitting: Speech Pathology

## 2020-05-10 ENCOUNTER — Ambulatory Visit: Payer: Medicare HMO | Admitting: Occupational Therapy

## 2020-05-10 DIAGNOSIS — M25562 Pain in left knee: Secondary | ICD-10-CM | POA: Insufficient documentation

## 2020-05-10 DIAGNOSIS — M6281 Muscle weakness (generalized): Secondary | ICD-10-CM

## 2020-05-10 DIAGNOSIS — R278 Other lack of coordination: Secondary | ICD-10-CM | POA: Diagnosis not present

## 2020-05-10 DIAGNOSIS — M25641 Stiffness of right hand, not elsewhere classified: Secondary | ICD-10-CM

## 2020-05-10 DIAGNOSIS — M79601 Pain in right arm: Secondary | ICD-10-CM | POA: Diagnosis not present

## 2020-05-10 DIAGNOSIS — R29818 Other symptoms and signs involving the nervous system: Secondary | ICD-10-CM | POA: Insufficient documentation

## 2020-05-10 DIAGNOSIS — R4701 Aphasia: Secondary | ICD-10-CM

## 2020-05-10 DIAGNOSIS — R296 Repeated falls: Secondary | ICD-10-CM | POA: Diagnosis not present

## 2020-05-10 DIAGNOSIS — R262 Difficulty in walking, not elsewhere classified: Secondary | ICD-10-CM

## 2020-05-10 DIAGNOSIS — R41841 Cognitive communication deficit: Secondary | ICD-10-CM | POA: Diagnosis not present
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
## 2020-05-10 NOTE — Patient Instructions (Signed)
Use our recipe to make dinner. Continue to name items around the house.

## 2020-05-12 ENCOUNTER — Ambulatory Visit: Payer: Medicare HMO

## 2020-05-12 ENCOUNTER — Encounter: Payer: Self-pay | Admitting: Occupational Therapy

## 2020-05-12 ENCOUNTER — Ambulatory Visit: Payer: Medicare HMO | Admitting: Occupational Therapy

## 2020-05-12 ENCOUNTER — Ambulatory Visit: Payer: Medicare HMO | Admitting: Speech Pathology

## 2020-05-12 ENCOUNTER — Other Ambulatory Visit: Payer: Self-pay

## 2020-05-12 ENCOUNTER — Encounter: Payer: Self-pay | Admitting: Speech Pathology

## 2020-05-12 DIAGNOSIS — R278 Other lack of coordination: Secondary | ICD-10-CM | POA: Diagnosis not present

## 2020-05-12 DIAGNOSIS — R41841 Cognitive communication deficit: Secondary | ICD-10-CM

## 2020-05-12 DIAGNOSIS — R262 Difficulty in walking, not elsewhere classified: Secondary | ICD-10-CM | POA: Diagnosis not present

## 2020-05-12 DIAGNOSIS — R296 Repeated falls: Secondary | ICD-10-CM | POA: Diagnosis not present

## 2020-05-12 DIAGNOSIS — R4701 Aphasia: Secondary | ICD-10-CM

## 2020-05-12 DIAGNOSIS — M79601 Pain in right arm: Secondary | ICD-10-CM | POA: Diagnosis not present

## 2020-05-12 DIAGNOSIS — M25641 Stiffness of right hand, not elsewhere classified: Secondary | ICD-10-CM | POA: Diagnosis not present

## 2020-05-12 DIAGNOSIS — M25562 Pain in left knee: Secondary | ICD-10-CM | POA: Diagnosis not present

## 2020-05-12 DIAGNOSIS — R29818 Other symptoms and signs involving the nervous system: Secondary | ICD-10-CM | POA: Diagnosis not present

## 2020-05-12 DIAGNOSIS — M6281 Muscle weakness (generalized): Secondary | ICD-10-CM

## 2020-05-12 NOTE — Patient Instructions (Signed)
Aphasia Therapy: How to Practice at [facility] . Go around the rooms in your home and name each object you see.  . Go outside and name what you see. [place] Kitchen Open drawers, cabinets, closets and name the items you see. . Look around and name items that begin with a certain letter. . Look around and name items that are a certain color.   PHRASE/SENTENCE LEVEL . Use a carrier phrase as you name items around your house/apartment, such as: "This is my _____", "I have _____", "Where is my _____".  . Describe what you are doing during your daily routine or chores.  o Bathroom: Brushing teeth, washing face o Dressing: Items of clothing you want to wear o Kitchen: Name the item you are putting away from the sink or dishwasher. Say the steps for making food.  o Laundry: Name the item you are folding/putting away.  . Look through pictures and describe who, what, where, when, and why for each picture.   READING . Reading out loud is a good way to practice your speech. You can read: . Newspapers, magazines, recipes, letters, bills, text messages/emails   WRITING . Write the names of items around your house, to-do lists, grocery lists, medication logs, or daily activity logs. . Write down your family member's names and organize it into a family tree . Write down common personal phrases you usually say.  . Write a summary of a news story or short video you watch online or on TV.   *Created by [employer] (2019)
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
## 2020-05-17 ENCOUNTER — Ambulatory Visit: Payer: Medicare HMO | Admitting: Occupational Therapy

## 2020-05-17 ENCOUNTER — Encounter: Payer: Self-pay | Admitting: Occupational Therapy

## 2020-05-17 ENCOUNTER — Ambulatory Visit: Payer: Medicare HMO

## 2020-05-17 ENCOUNTER — Encounter: Payer: Self-pay | Admitting: Speech Pathology

## 2020-05-17 ENCOUNTER — Other Ambulatory Visit: Payer: Self-pay

## 2020-05-17 ENCOUNTER — Ambulatory Visit: Payer: Medicare HMO | Admitting: Speech Pathology

## 2020-05-17 DIAGNOSIS — R278 Other lack of coordination: Secondary | ICD-10-CM

## 2020-05-17 DIAGNOSIS — R4701 Aphasia: Secondary | ICD-10-CM

## 2020-05-17 DIAGNOSIS — R41841 Cognitive communication deficit: Secondary | ICD-10-CM | POA: Diagnosis not present

## 2020-05-17 DIAGNOSIS — R29818 Other symptoms and signs involving the nervous system: Secondary | ICD-10-CM

## 2020-05-17 DIAGNOSIS — M6281 Muscle weakness (generalized): Secondary | ICD-10-CM

## 2020-05-17 DIAGNOSIS — R296 Repeated falls: Secondary | ICD-10-CM

## 2020-05-17 DIAGNOSIS — M25641 Stiffness of right hand, not elsewhere classified: Secondary | ICD-10-CM

## 2020-05-17 DIAGNOSIS — M25562 Pain in left knee: Secondary | ICD-10-CM | POA: Diagnosis not present

## 2020-05-17 DIAGNOSIS — R262 Difficulty in walking, not elsewhere classified: Secondary | ICD-10-CM

## 2020-05-17 DIAGNOSIS — M79601 Pain in right arm: Secondary | ICD-10-CM
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
## 2020-05-19 ENCOUNTER — Encounter: Payer: Self-pay | Admitting: Speech Pathology

## 2020-05-19 ENCOUNTER — Ambulatory Visit: Payer: Medicare HMO | Admitting: Occupational Therapy

## 2020-05-19 ENCOUNTER — Other Ambulatory Visit: Payer: Self-pay

## 2020-05-19 ENCOUNTER — Ambulatory Visit: Payer: Medicare HMO

## 2020-05-19 ENCOUNTER — Encounter: Payer: Self-pay | Admitting: Occupational Therapy

## 2020-05-19 ENCOUNTER — Ambulatory Visit: Payer: Medicare HMO | Admitting: Speech Pathology

## 2020-05-19 DIAGNOSIS — R278 Other lack of coordination: Secondary | ICD-10-CM

## 2020-05-19 DIAGNOSIS — R29818 Other symptoms and signs involving the nervous system: Secondary | ICD-10-CM

## 2020-05-19 DIAGNOSIS — R262 Difficulty in walking, not elsewhere classified: Secondary | ICD-10-CM | POA: Diagnosis not present

## 2020-05-19 DIAGNOSIS — M6281 Muscle weakness (generalized): Secondary | ICD-10-CM | POA: Diagnosis not present

## 2020-05-19 DIAGNOSIS — R41841 Cognitive communication deficit: Secondary | ICD-10-CM

## 2020-05-19 DIAGNOSIS — M25641 Stiffness of right hand, not elsewhere classified: Secondary | ICD-10-CM

## 2020-05-19 DIAGNOSIS — M79601 Pain in right arm: Secondary | ICD-10-CM | POA: Diagnosis not present

## 2020-05-19 DIAGNOSIS — R296 Repeated falls: Secondary | ICD-10-CM | POA: Diagnosis not present

## 2020-05-19 DIAGNOSIS — M25562 Pain in left knee: Secondary | ICD-10-CM

## 2020-05-19 NOTE — Patient Instructions (Signed)
Talk with Hinda Kehr about any further goals you have for therapy and if you want to continue coming to OP speech.
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
## 2020-05-20 DIAGNOSIS — S069X9D Unspecified intracranial injury with loss of consciousness of unspecified duration, subsequent encounter: Secondary | ICD-10-CM | POA: Diagnosis not present
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
## 2020-05-24 ENCOUNTER — Ambulatory Visit: Payer: Medicare HMO | Admitting: Occupational Therapy

## 2020-05-24 ENCOUNTER — Encounter: Payer: Self-pay | Admitting: Occupational Therapy

## 2020-05-24 ENCOUNTER — Ambulatory Visit: Payer: Medicare HMO

## 2020-05-24 ENCOUNTER — Encounter: Payer: Self-pay | Admitting: Speech Pathology

## 2020-05-24 ENCOUNTER — Ambulatory Visit: Payer: Medicare HMO | Admitting: Speech Pathology

## 2020-05-24 ENCOUNTER — Other Ambulatory Visit: Payer: Self-pay

## 2020-05-24 DIAGNOSIS — R278 Other lack of coordination: Secondary | ICD-10-CM

## 2020-05-24 DIAGNOSIS — R41841 Cognitive communication deficit: Secondary | ICD-10-CM | POA: Diagnosis not present

## 2020-05-24 DIAGNOSIS — R296 Repeated falls: Secondary | ICD-10-CM | POA: Diagnosis not present

## 2020-05-24 DIAGNOSIS — S42301D Unspecified fracture of shaft of humerus, right arm, subsequent encounter for fracture with routine healing: Secondary | ICD-10-CM | POA: Diagnosis not present

## 2020-05-24 DIAGNOSIS — M25641 Stiffness of right hand, not elsewhere classified: Secondary | ICD-10-CM

## 2020-05-24 DIAGNOSIS — R262 Difficulty in walking, not elsewhere classified: Secondary | ICD-10-CM

## 2020-05-24 DIAGNOSIS — M6281 Muscle weakness (generalized): Secondary | ICD-10-CM

## 2020-05-24 DIAGNOSIS — M79601 Pain in right arm: Secondary | ICD-10-CM | POA: Diagnosis not present

## 2020-05-24 DIAGNOSIS — M25562 Pain in left knee: Secondary | ICD-10-CM | POA: Diagnosis not present

## 2020-05-24 DIAGNOSIS — R29818 Other symptoms and signs involving the nervous system: Secondary | ICD-10-CM | POA: Diagnosis not present

## 2020-05-24 DIAGNOSIS — S42201D Unspecified fracture of upper end of right humerus, subsequent encounter for fracture with routine healing: Secondary | ICD-10-CM | POA: Diagnosis not present
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
## 2020-05-25 ENCOUNTER — Telehealth: Payer: Self-pay

## 2020-05-25 MED ORDER — LAMOTRIGINE 200 MG PO TABS: 200.0000 mg | ORAL_TABLET | Freq: Two times a day (BID) | ORAL | 4 refills | Status: DC

## 2020-05-25 MED ORDER — CARBIDOPA-LEVODOPA 25-100 MG PO TABS: ORAL_TABLET | ORAL | 4 refills | Status: DC

## 2020-05-25 MED ORDER — ALPRAZOLAM 1 MG PO TABS: ORAL_TABLET | ORAL | 1 refills | Status: DC

## 2020-05-25 NOTE — Telephone Encounter (Signed)
Patient husband called to update medication, Alprazolam, Carbidopa, Lamictal and requesting refills. Is this okay to call in?

## 2020-05-25 NOTE — Telephone Encounter (Signed)
[place]-husband of patient informed refills sent

## 2020-05-25 NOTE — Telephone Encounter (Signed)
I refilled all meds.  thx
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
## 2020-05-26 ENCOUNTER — Ambulatory Visit: Payer: Medicare HMO | Admitting: Occupational Therapy

## 2020-05-26 ENCOUNTER — Encounter: Payer: Self-pay | Admitting: Speech Pathology

## 2020-05-26 ENCOUNTER — Encounter: Payer: Self-pay | Admitting: Occupational Therapy

## 2020-05-26 ENCOUNTER — Ambulatory Visit: Payer: Medicare HMO | Admitting: Speech Pathology

## 2020-05-26 ENCOUNTER — Ambulatory Visit: Payer: Medicare HMO

## 2020-05-26 ENCOUNTER — Other Ambulatory Visit: Payer: Self-pay

## 2020-05-26 DIAGNOSIS — R29818 Other symptoms and signs involving the nervous system: Secondary | ICD-10-CM | POA: Diagnosis not present

## 2020-05-26 DIAGNOSIS — M25641 Stiffness of right hand, not elsewhere classified: Secondary | ICD-10-CM | POA: Diagnosis not present

## 2020-05-26 DIAGNOSIS — M6281 Muscle weakness (generalized): Secondary | ICD-10-CM | POA: Diagnosis not present

## 2020-05-26 DIAGNOSIS — R41841 Cognitive communication deficit: Secondary | ICD-10-CM

## 2020-05-26 DIAGNOSIS — M79601 Pain in right arm: Secondary | ICD-10-CM | POA: Diagnosis not present

## 2020-05-26 DIAGNOSIS — R278 Other lack of coordination: Secondary | ICD-10-CM | POA: Diagnosis not present

## 2020-05-26 DIAGNOSIS — R296 Repeated falls: Secondary | ICD-10-CM | POA: Diagnosis not present

## 2020-05-26 DIAGNOSIS — M25562 Pain in left knee: Secondary | ICD-10-CM | POA: Diagnosis not present

## 2020-05-26 DIAGNOSIS — R262 Difficulty in walking, not elsewhere classified: Secondary | ICD-10-CM | POA: Diagnosis not present
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
## 2020-05-28 DIAGNOSIS — F3181 Bipolar II disorder: Secondary | ICD-10-CM | POA: Diagnosis not present

## 2020-05-28 DIAGNOSIS — G43109 Migraine with aura, not intractable, without status migrainosus: Secondary | ICD-10-CM | POA: Diagnosis not present

## 2020-05-28 DIAGNOSIS — F4312 Post-traumatic stress disorder, chronic: Secondary | ICD-10-CM | POA: Diagnosis not present

## 2020-06-01 ENCOUNTER — Ambulatory Visit: Payer: Medicare HMO | Admitting: Speech Pathology

## 2020-06-01 ENCOUNTER — Other Ambulatory Visit: Payer: Self-pay

## 2020-06-01 ENCOUNTER — Encounter: Payer: Self-pay | Admitting: Occupational Therapy

## 2020-06-01 ENCOUNTER — Ambulatory Visit: Payer: Medicare HMO | Admitting: Occupational Therapy

## 2020-06-01 ENCOUNTER — Encounter: Payer: Self-pay | Admitting: Speech Pathology

## 2020-06-01 ENCOUNTER — Ambulatory Visit: Payer: Medicare HMO

## 2020-06-01 DIAGNOSIS — M25641 Stiffness of right hand, not elsewhere classified: Secondary | ICD-10-CM | POA: Diagnosis not present

## 2020-06-01 DIAGNOSIS — R278 Other lack of coordination: Secondary | ICD-10-CM | POA: Diagnosis not present

## 2020-06-01 DIAGNOSIS — M6281 Muscle weakness (generalized): Secondary | ICD-10-CM

## 2020-06-01 DIAGNOSIS — R262 Difficulty in walking, not elsewhere classified: Secondary | ICD-10-CM | POA: Diagnosis not present

## 2020-06-01 DIAGNOSIS — R29818 Other symptoms and signs involving the nervous system: Secondary | ICD-10-CM

## 2020-06-01 DIAGNOSIS — M79601 Pain in right arm: Secondary | ICD-10-CM

## 2020-06-01 DIAGNOSIS — R41841 Cognitive communication deficit: Secondary | ICD-10-CM

## 2020-06-01 DIAGNOSIS — M25562 Pain in left knee: Secondary | ICD-10-CM | POA: Diagnosis not present

## 2020-06-01 DIAGNOSIS — R296 Repeated falls: Secondary | ICD-10-CM | POA: Diagnosis not present
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
## 2020-06-02 ENCOUNTER — Ambulatory Visit: Payer: Medicare HMO | Admitting: Occupational Therapy

## 2020-06-02 ENCOUNTER — Encounter: Payer: Self-pay | Admitting: Speech Pathology

## 2020-06-02 ENCOUNTER — Ambulatory Visit: Payer: Medicare HMO | Admitting: Speech Pathology

## 2020-06-02 DIAGNOSIS — R262 Difficulty in walking, not elsewhere classified: Secondary | ICD-10-CM | POA: Diagnosis not present

## 2020-06-02 DIAGNOSIS — R41841 Cognitive communication deficit: Secondary | ICD-10-CM | POA: Diagnosis not present

## 2020-06-02 DIAGNOSIS — R296 Repeated falls: Secondary | ICD-10-CM | POA: Diagnosis not present

## 2020-06-02 DIAGNOSIS — M79601 Pain in right arm: Secondary | ICD-10-CM | POA: Diagnosis not present

## 2020-06-02 DIAGNOSIS — R29818 Other symptoms and signs involving the nervous system: Secondary | ICD-10-CM | POA: Diagnosis not present

## 2020-06-02 DIAGNOSIS — R278 Other lack of coordination: Secondary | ICD-10-CM | POA: Diagnosis not present

## 2020-06-02 DIAGNOSIS — M25562 Pain in left knee: Secondary | ICD-10-CM | POA: Diagnosis not present

## 2020-06-02 DIAGNOSIS — M6281 Muscle weakness (generalized): Secondary | ICD-10-CM

## 2020-06-02 DIAGNOSIS — M25641 Stiffness of right hand, not elsewhere classified: Secondary | ICD-10-CM | POA: Diagnosis not present
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
## 2020-06-02 NOTE — Patient Instructions (Signed)
Access Code: [email or web address] URL: [email or web address] Date: 06/01/2020 Prepared by: Merleen Nicely, OTR/L  Exercises: - Radial Nerve Flossing - 2-3 x daily - 1 sets - 10 reps - Composite Wrist and Finger Flexion and Extension - 2-3 x daily - 1 sets - 10 reps - Finger Spreading - 2-3 x daily - 1 sets - 15 reps - Seated Shoulder Flexion Towel Slide at Table Top - 2 x daily - 1 sets - 10 reps - Seated Shoulder Abduction Towel Slide at Table Top - 1 x daily - 1 sets - 10 reps - Seated Posterior Shoulder Stretch - 2 x daily - 1 sets - 5 reps - 30 hold - Sleeper Stretch - 2 x daily - 1 sets - 5 reps - 30 hold - Seated Scapular Retraction with External Rotation - 1 x daily - 3 sets - 5 reps - Seated Bent Over Shoulder Row with Dumbbells - 1 x daily - 3 sets - 5 reps - Seated Triceps Extension with Dumbbell Single Arm - 1 x daily - 2 sets - 10 reps
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
## 2020-06-07 ENCOUNTER — Encounter: Payer: Self-pay | Admitting: Speech Pathology

## 2020-06-07 ENCOUNTER — Other Ambulatory Visit: Payer: Self-pay

## 2020-06-07 ENCOUNTER — Ambulatory Visit: Payer: Medicare HMO | Admitting: Occupational Therapy

## 2020-06-07 ENCOUNTER — Ambulatory Visit: Payer: Medicare HMO | Admitting: Speech Pathology

## 2020-06-07 ENCOUNTER — Encounter: Payer: Medicare HMO | Admitting: Occupational Therapy

## 2020-06-07 DIAGNOSIS — R278 Other lack of coordination: Secondary | ICD-10-CM | POA: Diagnosis not present

## 2020-06-07 DIAGNOSIS — R41841 Cognitive communication deficit: Secondary | ICD-10-CM

## 2020-06-07 DIAGNOSIS — M25641 Stiffness of right hand, not elsewhere classified: Secondary | ICD-10-CM | POA: Diagnosis not present

## 2020-06-07 DIAGNOSIS — R262 Difficulty in walking, not elsewhere classified: Secondary | ICD-10-CM | POA: Diagnosis not present

## 2020-06-07 DIAGNOSIS — M79601 Pain in right arm: Secondary | ICD-10-CM | POA: Diagnosis not present

## 2020-06-07 DIAGNOSIS — M25562 Pain in left knee: Secondary | ICD-10-CM | POA: Diagnosis not present

## 2020-06-07 DIAGNOSIS — M6281 Muscle weakness (generalized): Secondary | ICD-10-CM | POA: Diagnosis not present

## 2020-06-07 DIAGNOSIS — R29818 Other symptoms and signs involving the nervous system: Secondary | ICD-10-CM

## 2020-06-07 DIAGNOSIS — R296 Repeated falls: Secondary | ICD-10-CM | POA: Diagnosis not present
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
## 2020-06-09 ENCOUNTER — Other Ambulatory Visit: Payer: Self-pay

## 2020-06-09 ENCOUNTER — Ambulatory Visit: Payer: Medicare HMO | Admitting: Speech Pathology

## 2020-06-09 ENCOUNTER — Encounter: Payer: Medicare HMO | Admitting: Occupational Therapy

## 2020-06-09 ENCOUNTER — Encounter: Payer: Self-pay | Admitting: Speech Pathology

## 2020-06-09 ENCOUNTER — Ambulatory Visit: Payer: Medicare HMO | Admitting: Occupational Therapy

## 2020-06-09 DIAGNOSIS — M6281 Muscle weakness (generalized): Secondary | ICD-10-CM | POA: Diagnosis not present

## 2020-06-09 DIAGNOSIS — R29818 Other symptoms and signs involving the nervous system: Secondary | ICD-10-CM | POA: Diagnosis not present

## 2020-06-09 DIAGNOSIS — R41841 Cognitive communication deficit: Secondary | ICD-10-CM

## 2020-06-09 DIAGNOSIS — R278 Other lack of coordination: Secondary | ICD-10-CM | POA: Diagnosis not present

## 2020-06-09 DIAGNOSIS — M79601 Pain in right arm: Secondary | ICD-10-CM | POA: Diagnosis not present

## 2020-06-09 DIAGNOSIS — R296 Repeated falls: Secondary | ICD-10-CM | POA: Diagnosis not present

## 2020-06-09 DIAGNOSIS — M25641 Stiffness of right hand, not elsewhere classified: Secondary | ICD-10-CM | POA: Diagnosis not present

## 2020-06-09 DIAGNOSIS — M25562 Pain in left knee: Secondary | ICD-10-CM | POA: Diagnosis not present

## 2020-06-09 DIAGNOSIS — R262 Difficulty in walking, not elsewhere classified: Secondary | ICD-10-CM | POA: Diagnosis not present
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
## 2020-06-09 NOTE — Patient Instructions (Signed)
Attempt to organize your medication independently this weekend for the week ahead! Have [facility] double check with you for errors.

## 2020-06-15 ENCOUNTER — Ambulatory Visit: Payer: Medicare HMO | Attending: Physician Assistant | Admitting: Occupational Therapy

## 2020-06-15 ENCOUNTER — Other Ambulatory Visit: Payer: Self-pay

## 2020-06-15 ENCOUNTER — Encounter: Payer: Self-pay | Admitting: Occupational Therapy

## 2020-06-15 ENCOUNTER — Encounter: Payer: Self-pay | Admitting: Speech Pathology

## 2020-06-15 ENCOUNTER — Ambulatory Visit: Payer: Medicare HMO | Admitting: Speech Pathology

## 2020-06-15 DIAGNOSIS — M25641 Stiffness of right hand, not elsewhere classified: Secondary | ICD-10-CM | POA: Insufficient documentation

## 2020-06-15 DIAGNOSIS — R41841 Cognitive communication deficit: Secondary | ICD-10-CM

## 2020-06-15 DIAGNOSIS — R278 Other lack of coordination: Secondary | ICD-10-CM | POA: Diagnosis not present

## 2020-06-15 DIAGNOSIS — M79601 Pain in right arm: Secondary | ICD-10-CM

## 2020-06-15 DIAGNOSIS — R29818 Other symptoms and signs involving the nervous system: Secondary | ICD-10-CM

## 2020-06-15 DIAGNOSIS — M6281 Muscle weakness (generalized): Secondary | ICD-10-CM

## 2020-06-15 NOTE — Patient Instructions (Addendum)
Access Code: [email or web address] URL: [email or web address] Date: 06/15/2020  Exercises Radial Nerve Flossing - 2-3 x daily - 1 sets - 10 reps Composite Wrist and Finger Flexion and Extension - 2-3 x daily - 1 sets - 10 reps Finger Spreading - 2-3 x daily - 1 sets - 15 reps Shoulder Flexion Wall Slide with Towel - 2 x daily - 1 sets - 10 reps Seated Shoulder Abduction Towel Slide at Table Top - 1 x daily - 1 sets - 10 reps Seated Triceps Extension with Dumbbell Single Arm - 1 x daily - 2 sets - 10 reps Seated Posterior Shoulder Stretch - 2 x daily - 1 sets - 5 reps - 10 hold Seated Single Arm Shoulder External Rotation - 2 x daily - 1 sets - 10 reps Standing Shoulder External Rotation Stretch in Doorway - 2 x daily - 1 sets - 5 reps - 10 hold Sleeper Stretch - 2 x daily - 1 sets - 5 reps - 10 hold  **Bolded exercises are new additions to current HEP
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
## 2020-06-16 ENCOUNTER — Ambulatory Visit: Payer: Medicare HMO | Admitting: Speech Pathology

## 2020-06-16 ENCOUNTER — Ambulatory Visit: Payer: Medicare HMO | Admitting: Occupational Therapy

## 2020-06-16 DIAGNOSIS — M25641 Stiffness of right hand, not elsewhere classified: Secondary | ICD-10-CM | POA: Diagnosis not present

## 2020-06-16 DIAGNOSIS — M6281 Muscle weakness (generalized): Secondary | ICD-10-CM | POA: Diagnosis not present

## 2020-06-16 DIAGNOSIS — R41841 Cognitive communication deficit: Secondary | ICD-10-CM | POA: Diagnosis not present

## 2020-06-16 DIAGNOSIS — M79601 Pain in right arm: Secondary | ICD-10-CM | POA: Diagnosis not present

## 2020-06-16 DIAGNOSIS — R29818 Other symptoms and signs involving the nervous system: Secondary | ICD-10-CM | POA: Diagnosis not present

## 2020-06-16 DIAGNOSIS — R278 Other lack of coordination: Secondary | ICD-10-CM
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
## 2020-06-21 ENCOUNTER — Ambulatory Visit: Payer: Medicare HMO | Admitting: Occupational Therapy

## 2020-06-21 ENCOUNTER — Other Ambulatory Visit: Payer: Self-pay

## 2020-06-21 ENCOUNTER — Ambulatory Visit: Payer: Medicare HMO | Admitting: Speech Pathology

## 2020-06-21 ENCOUNTER — Encounter: Payer: Self-pay | Admitting: Speech Pathology

## 2020-06-21 ENCOUNTER — Encounter: Payer: Self-pay | Admitting: Occupational Therapy

## 2020-06-21 DIAGNOSIS — M25641 Stiffness of right hand, not elsewhere classified: Secondary | ICD-10-CM

## 2020-06-21 DIAGNOSIS — M79601 Pain in right arm: Secondary | ICD-10-CM | POA: Diagnosis not present

## 2020-06-21 DIAGNOSIS — R41841 Cognitive communication deficit: Secondary | ICD-10-CM | POA: Diagnosis not present

## 2020-06-21 DIAGNOSIS — R278 Other lack of coordination: Secondary | ICD-10-CM | POA: Diagnosis not present

## 2020-06-21 DIAGNOSIS — M6281 Muscle weakness (generalized): Secondary | ICD-10-CM

## 2020-06-21 DIAGNOSIS — R29818 Other symptoms and signs involving the nervous system: Secondary | ICD-10-CM | POA: Diagnosis not present

## 2020-06-21 DIAGNOSIS — D251 Intramural leiomyoma of uterus: Secondary | ICD-10-CM | POA: Diagnosis not present

## 2020-06-21 DIAGNOSIS — R102 Pelvic and perineal pain: Secondary | ICD-10-CM | POA: Diagnosis not present

## 2020-06-21 DIAGNOSIS — R9389 Abnormal findings on diagnostic imaging of other specified body structures: Secondary | ICD-10-CM | POA: Diagnosis not present
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
## 2020-06-21 NOTE — Patient Instructions (Addendum)
Flexibility: Upper Trapezius Stretch    Gently grasp right side of head. Tilt head away until a gentle stretch is felt. Hold ~10 seconds. Repeat 5-10 times per set. Do 1 set(s) per session. Do 2 sessions per day.
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
## 2020-06-22 ENCOUNTER — Ambulatory Visit: Payer: Medicare HMO | Admitting: Occupational Therapy

## 2020-06-22 ENCOUNTER — Ambulatory Visit: Payer: Medicare HMO | Admitting: Speech Pathology

## 2020-06-22 DIAGNOSIS — F4312 Post-traumatic stress disorder, chronic: Secondary | ICD-10-CM | POA: Diagnosis not present

## 2020-06-22 DIAGNOSIS — G43109 Migraine with aura, not intractable, without status migrainosus: Secondary | ICD-10-CM | POA: Diagnosis not present

## 2020-06-22 DIAGNOSIS — F3181 Bipolar II disorder: Secondary | ICD-10-CM | POA: Diagnosis not present

## 2020-07-04 ENCOUNTER — Telehealth: Payer: Self-pay | Admitting: *Deleted

## 2020-07-04 ENCOUNTER — Ambulatory Visit: Payer: Medicare HMO | Admitting: Occupational Therapy

## 2020-07-04 ENCOUNTER — Encounter: Payer: Self-pay | Admitting: Occupational Therapy

## 2020-07-04 ENCOUNTER — Encounter: Payer: Self-pay | Admitting: Speech Pathology

## 2020-07-04 ENCOUNTER — Other Ambulatory Visit: Payer: Self-pay

## 2020-07-04 ENCOUNTER — Ambulatory Visit: Payer: Medicare HMO | Admitting: Speech Pathology

## 2020-07-04 ENCOUNTER — Other Ambulatory Visit: Payer: Self-pay | Admitting: Cardiology

## 2020-07-04 DIAGNOSIS — R278 Other lack of coordination: Secondary | ICD-10-CM | POA: Diagnosis not present

## 2020-07-04 DIAGNOSIS — M79601 Pain in right arm: Secondary | ICD-10-CM | POA: Diagnosis not present

## 2020-07-04 DIAGNOSIS — M6281 Muscle weakness (generalized): Secondary | ICD-10-CM

## 2020-07-04 DIAGNOSIS — R41841 Cognitive communication deficit: Secondary | ICD-10-CM | POA: Diagnosis not present

## 2020-07-04 DIAGNOSIS — R29818 Other symptoms and signs involving the nervous system: Secondary | ICD-10-CM

## 2020-07-04 DIAGNOSIS — M25641 Stiffness of right hand, not elsewhere classified: Secondary | ICD-10-CM | POA: Diagnosis not present
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
## 2020-07-04 NOTE — Telephone Encounter (Signed)
Emily Rice called and is requesting a refill on her Eliquis.  She does not see her PCP until mid May (she has appt with you at that time too)-07/20/20. I spoke with her husband and pointed out we had only filled it once 04/04/20, what has she bee taking since then.  He reports because she was in the hospital for so long she had a prescription at home that she had not taken and so she took that. I will forward to Emily Rice to make the decision if he will refill.
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
## 2020-07-05 MED ORDER — ELIQUIS 5 MG PO TABS: 1.0000 | ORAL_TABLET | Freq: Two times a day (BID) | ORAL | 3 refills | Status: DC

## 2020-07-05 NOTE — Addendum Note (Signed)
Addended by: Alger Simons T on: 07/05/2020 12:30 PM   Modules accepted: Orders

## 2020-07-05 NOTE — Telephone Encounter (Signed)
RF sent to pharmacy 

## 2020-07-05 NOTE — Telephone Encounter (Signed)
Patient notified

## 2020-07-06 ENCOUNTER — Ambulatory Visit: Payer: Medicare HMO | Admitting: Physical Medicine & Rehabilitation

## 2020-07-06 DIAGNOSIS — R9389 Abnormal findings on diagnostic imaging of other specified body structures: Secondary | ICD-10-CM | POA: Diagnosis not present

## 2020-07-06 DIAGNOSIS — D259 Leiomyoma of uterus, unspecified: Secondary | ICD-10-CM | POA: Diagnosis not present

## 2020-07-06 DIAGNOSIS — R102 Pelvic and perineal pain: Secondary | ICD-10-CM | POA: Diagnosis not present

## 2020-07-06 DIAGNOSIS — G8929 Other chronic pain: Secondary | ICD-10-CM | POA: Diagnosis not present

## 2020-07-07 ENCOUNTER — Ambulatory Visit: Payer: Medicare HMO | Admitting: Speech Pathology

## 2020-07-07 ENCOUNTER — Other Ambulatory Visit: Payer: Self-pay

## 2020-07-07 ENCOUNTER — Encounter: Payer: Medicare HMO | Admitting: Occupational Therapy

## 2020-07-07 ENCOUNTER — Encounter: Payer: Self-pay | Admitting: Speech Pathology

## 2020-07-07 DIAGNOSIS — M25641 Stiffness of right hand, not elsewhere classified: Secondary | ICD-10-CM | POA: Diagnosis not present

## 2020-07-07 DIAGNOSIS — R29818 Other symptoms and signs involving the nervous system: Secondary | ICD-10-CM | POA: Diagnosis not present

## 2020-07-07 DIAGNOSIS — R41841 Cognitive communication deficit: Secondary | ICD-10-CM | POA: Diagnosis not present

## 2020-07-07 DIAGNOSIS — M6281 Muscle weakness (generalized): Secondary | ICD-10-CM | POA: Diagnosis not present

## 2020-07-07 DIAGNOSIS — M79601 Pain in right arm: Secondary | ICD-10-CM | POA: Diagnosis not present

## 2020-07-07 DIAGNOSIS — R278 Other lack of coordination: Secondary | ICD-10-CM | POA: Diagnosis not present
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
## 2020-07-11 ENCOUNTER — Ambulatory Visit: Payer: Medicare HMO | Admitting: Occupational Therapy

## 2020-07-11 ENCOUNTER — Ambulatory Visit: Payer: Medicare HMO | Admitting: Speech Pathology

## 2020-07-12 ENCOUNTER — Ambulatory Visit: Payer: Medicare HMO | Attending: Physician Assistant | Admitting: Occupational Therapy

## 2020-07-12 ENCOUNTER — Ambulatory Visit: Payer: Medicare HMO | Admitting: Speech Pathology

## 2020-07-12 ENCOUNTER — Other Ambulatory Visit: Payer: Self-pay

## 2020-07-12 ENCOUNTER — Encounter: Payer: Self-pay | Admitting: Speech Pathology

## 2020-07-12 DIAGNOSIS — M25641 Stiffness of right hand, not elsewhere classified: Secondary | ICD-10-CM | POA: Diagnosis not present

## 2020-07-12 DIAGNOSIS — R29818 Other symptoms and signs involving the nervous system: Secondary | ICD-10-CM | POA: Insufficient documentation

## 2020-07-12 DIAGNOSIS — R41841 Cognitive communication deficit: Secondary | ICD-10-CM

## 2020-07-12 DIAGNOSIS — R4701 Aphasia: Secondary | ICD-10-CM | POA: Diagnosis not present

## 2020-07-12 DIAGNOSIS — R102 Pelvic and perineal pain: Secondary | ICD-10-CM | POA: Diagnosis not present

## 2020-07-12 DIAGNOSIS — R278 Other lack of coordination: Secondary | ICD-10-CM

## 2020-07-12 DIAGNOSIS — M6281 Muscle weakness (generalized): Secondary | ICD-10-CM | POA: Diagnosis not present

## 2020-07-12 DIAGNOSIS — M79601 Pain in right arm: Secondary | ICD-10-CM | POA: Diagnosis not present
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
## 2020-07-14 ENCOUNTER — Ambulatory Visit: Payer: Medicare HMO | Admitting: Speech Pathology

## 2020-07-14 NOTE — Patient Instructions (Signed)
Access Code: [email or web address] URL: [email or web address] Date: 07/14/2020 Prepared by: Merleen Nicely, OTR/L  Exercises Wrist and Hand: Composite Wrist and Finger Flexion and Extension - 1 set of 10 reps Finger Spreading - 1 set of 15 reps Shoulder: Upper Trapezius Stretch - 1 set of 5 reps, holding about 5 seconds Shoulder Flexion Wall Slide with Towel - 1 set of 10 reps Shoulder Flexion with Dowel - 2 sets of 15 reps Shoulder Abduction Towel Slide at Table Top - 1 set of 10 reps Shoulder Abduction with Dowel - 2 sets of 15 reps Posterior Shoulder Stretch - 1 set of 5 reps, holding 10 seconds Single Arm Shoulder External Rotation - 1 set of 10 reps  or Shoulder External Rotation Stretch in Doorway - 1 set of 5 reps, holding 10 seconds

## 2020-07-19 ENCOUNTER — Ambulatory Visit: Payer: Medicare HMO | Admitting: Occupational Therapy

## 2020-07-19 ENCOUNTER — Ambulatory Visit: Payer: Medicare HMO | Admitting: Speech Pathology

## 2020-07-20 ENCOUNTER — Encounter: Payer: Self-pay | Admitting: Physical Medicine & Rehabilitation

## 2020-07-20 ENCOUNTER — Other Ambulatory Visit: Payer: Self-pay

## 2020-07-20 ENCOUNTER — Encounter: Payer: Medicare HMO | Attending: Physical Medicine & Rehabilitation | Admitting: Physical Medicine & Rehabilitation

## 2020-07-20 VITALS — BP 134/85 | HR 68 | Temp 98.8°F | Ht 63.0 in | Wt 175.0 lb

## 2020-07-20 DIAGNOSIS — S066X1S Traumatic subarachnoid hemorrhage with loss of consciousness of 30 minutes or less, sequela: Secondary | ICD-10-CM

## 2020-07-20 DIAGNOSIS — F319 Bipolar disorder, unspecified: Secondary | ICD-10-CM
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
## 2020-07-20 NOTE — Patient Instructions (Signed)
?  CONSOLIDATING SEROQUEL AND ABILIFY?  KEEPING AN ORGANIZER, CALENDAR, SCHEDULE TO HELP SET UP A ROUTINE   ?STIMULANT TO HELP WITH YOUR CONCENTRATION.

## 2020-07-26 ENCOUNTER — Other Ambulatory Visit: Payer: Self-pay | Admitting: Internal Medicine

## 2020-07-26 DIAGNOSIS — Z1231 Encounter for screening mammogram for malignant neoplasm of breast: Secondary | ICD-10-CM

## 2020-07-28 ENCOUNTER — Ambulatory Visit: Payer: Medicare HMO | Admitting: Speech Pathology

## 2020-07-28 ENCOUNTER — Ambulatory Visit: Payer: Medicare HMO | Admitting: Occupational Therapy

## 2020-08-02 DIAGNOSIS — S42201D Unspecified fracture of upper end of right humerus, subsequent encounter for fracture with routine healing: Secondary | ICD-10-CM | POA: Diagnosis not present

## 2020-08-02 DIAGNOSIS — S42301D Unspecified fracture of shaft of humerus, right arm, subsequent encounter for fracture with routine healing: Secondary | ICD-10-CM | POA: Diagnosis not present

## 2020-08-12 DIAGNOSIS — G43109 Migraine with aura, not intractable, without status migrainosus: Secondary | ICD-10-CM | POA: Diagnosis not present

## 2020-08-12 DIAGNOSIS — F4312 Post-traumatic stress disorder, chronic: Secondary | ICD-10-CM | POA: Diagnosis not present

## 2020-08-12 DIAGNOSIS — F3181 Bipolar II disorder: Secondary | ICD-10-CM | POA: Diagnosis not present

## 2020-08-17 DIAGNOSIS — M25511 Pain in right shoulder: Secondary | ICD-10-CM | POA: Diagnosis not present

## 2020-09-07 DIAGNOSIS — F319 Bipolar disorder, unspecified: Secondary | ICD-10-CM | POA: Diagnosis not present

## 2020-09-07 DIAGNOSIS — Z0000 Encounter for general adult medical examination without abnormal findings: Secondary | ICD-10-CM | POA: Diagnosis not present

## 2020-09-07 DIAGNOSIS — G20 Parkinson's disease: Secondary | ICD-10-CM | POA: Diagnosis not present

## 2020-09-07 DIAGNOSIS — R001 Bradycardia, unspecified: Secondary | ICD-10-CM | POA: Diagnosis not present

## 2020-09-07 DIAGNOSIS — D6869 Other thrombophilia: Secondary | ICD-10-CM | POA: Diagnosis not present

## 2020-09-07 DIAGNOSIS — I480 Paroxysmal atrial fibrillation: Secondary | ICD-10-CM | POA: Diagnosis not present

## 2020-09-07 DIAGNOSIS — E559 Vitamin D deficiency, unspecified: Secondary | ICD-10-CM | POA: Diagnosis not present

## 2020-09-07 DIAGNOSIS — D573 Sickle-cell trait: Secondary | ICD-10-CM | POA: Diagnosis not present

## 2020-09-07 DIAGNOSIS — F411 Generalized anxiety disorder: Secondary | ICD-10-CM | POA: Diagnosis not present

## 2020-09-09 DIAGNOSIS — M25511 Pain in right shoulder: Secondary | ICD-10-CM | POA: Diagnosis not present

## 2020-09-21 ENCOUNTER — Ambulatory Visit: Payer: Medicare HMO

## 2020-09-27 ENCOUNTER — Encounter: Payer: Self-pay | Admitting: Cardiology

## 2020-09-27 ENCOUNTER — Other Ambulatory Visit: Payer: Self-pay

## 2020-09-27 ENCOUNTER — Ambulatory Visit: Payer: Medicare HMO | Admitting: Cardiology

## 2020-09-27 VITALS — BP 133/86 | HR 66 | Temp 98.4°F | Resp 16 | Ht 63.0 in | Wt 178.8 lb

## 2020-09-27 DIAGNOSIS — Z86711 Personal history of pulmonary embolism: Secondary | ICD-10-CM | POA: Diagnosis not present

## 2020-09-27 DIAGNOSIS — Z86718 Personal history of other venous thrombosis and embolism: Secondary | ICD-10-CM

## 2020-09-27 DIAGNOSIS — Z7901 Long term (current) use of anticoagulants: Secondary | ICD-10-CM | POA: Diagnosis not present

## 2020-09-27 DIAGNOSIS — Z8249 Family history of ischemic heart disease and other diseases of the circulatory system: Secondary | ICD-10-CM | POA: Diagnosis not present

## 2020-09-27 DIAGNOSIS — I480 Paroxysmal atrial fibrillation: Secondary | ICD-10-CM

## 2020-09-27 MED ORDER — METOPROLOL SUCCINATE ER 25 MG PO TB24: 25.0000 mg | ORAL_TABLET | Freq: Every day | ORAL | 0 refills | Status: DC
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
## 2020-10-04 DIAGNOSIS — S42201D Unspecified fracture of upper end of right humerus, subsequent encounter for fracture with routine healing: Secondary | ICD-10-CM | POA: Diagnosis not present

## 2020-10-05 ENCOUNTER — Encounter: Payer: Self-pay | Admitting: Physical Medicine & Rehabilitation

## 2020-10-05 ENCOUNTER — Other Ambulatory Visit: Payer: Self-pay

## 2020-10-05 ENCOUNTER — Encounter: Payer: Medicare HMO | Attending: Physical Medicine & Rehabilitation | Admitting: Physical Medicine & Rehabilitation

## 2020-10-05 VITALS — BP 125/79 | HR 52 | Temp 98.4°F | Ht 63.0 in | Wt 177.0 lb

## 2020-10-05 DIAGNOSIS — S069X1D Unspecified intracranial injury with loss of consciousness of 30 minutes or less, subsequent encounter: Secondary | ICD-10-CM | POA: Diagnosis not present

## 2020-10-05 NOTE — Patient Instructions (Addendum)
ASK PSYCHIATRY IF THEY WOULD FEEL COMFORTABLE WITH A TRIAL OF A STIMULANT LIKE RITALIN. ('5MG'$  TWICE DAILY)   TRY DRIVING IN AN EMPTY PARKING LOT TO JUDGE ATTENTION TO CONTROLS AND REACTION TIMES.
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
## 2020-10-11 ENCOUNTER — Telehealth: Payer: Self-pay

## 2020-10-11 NOTE — Telephone Encounter (Signed)
As long as her systolic blood pressures are greater than 100 mmHg she can take an extra dose of metoprolol.

## 2020-10-11 NOTE — Telephone Encounter (Signed)
Patient called and stated that she has been having SOB, Palpitations, chest heaviness and dizziness. She took her Metoprolol this morning but wants to know if she needs to take another one, or do you want her to be seen ASAP. Please advise.

## 2020-10-12 NOTE — Telephone Encounter (Signed)
Patient is aware that it is ok to take an extra dose  if systolic blood pressures are greater than 100 mmHg.

## 2020-10-26 ENCOUNTER — Other Ambulatory Visit: Payer: Self-pay

## 2020-10-26 DIAGNOSIS — I480 Paroxysmal atrial fibrillation: Secondary | ICD-10-CM

## 2020-10-26 MED ORDER — METOPROLOL SUCCINATE ER 25 MG PO TB24: 25.0000 mg | ORAL_TABLET | Freq: Every day | ORAL | 3 refills | Status: DC

## 2020-10-28 DIAGNOSIS — G2401 Drug induced subacute dyskinesia: Secondary | ICD-10-CM | POA: Diagnosis not present

## 2020-10-28 DIAGNOSIS — F3181 Bipolar II disorder: Secondary | ICD-10-CM | POA: Diagnosis not present

## 2020-10-28 DIAGNOSIS — G43109 Migraine with aura, not intractable, without status migrainosus: Secondary | ICD-10-CM | POA: Diagnosis not present

## 2020-10-28 DIAGNOSIS — F4312 Post-traumatic stress disorder, chronic: Secondary | ICD-10-CM | POA: Diagnosis not present

## 2020-11-02 ENCOUNTER — Ambulatory Visit: Payer: Medicare HMO

## 2020-11-03 ENCOUNTER — Ambulatory Visit: Payer: Medicare HMO | Admitting: Cardiology

## 2020-11-23 ENCOUNTER — Other Ambulatory Visit: Payer: Self-pay

## 2020-11-23 DIAGNOSIS — I480 Paroxysmal atrial fibrillation: Secondary | ICD-10-CM

## 2020-11-23 MED ORDER — METOPROLOL SUCCINATE ER 25 MG PO TB24: 25.0000 mg | ORAL_TABLET | Freq: Every day | ORAL | 1 refills | Status: DC

## 2020-11-30 DIAGNOSIS — L814 Other melanin hyperpigmentation: Secondary | ICD-10-CM | POA: Diagnosis not present

## 2020-11-30 DIAGNOSIS — L700 Acne vulgaris: Secondary | ICD-10-CM | POA: Diagnosis not present

## 2020-12-02 ENCOUNTER — Other Ambulatory Visit: Payer: Self-pay

## 2020-12-02 ENCOUNTER — Ambulatory Visit
Admission: RE | Admit: 2020-12-02 | Discharge: 2020-12-02 | Disposition: A | Discharge status: Home / Self Care | Payer: Medicare HMO | Source: Ambulatory Visit | Attending: Internal Medicine | Admitting: Internal Medicine

## 2020-12-02 ENCOUNTER — Ambulatory Visit: Payer: Medicare HMO

## 2020-12-02 DIAGNOSIS — B351 Tinea unguium: Secondary | ICD-10-CM | POA: Diagnosis not present

## 2020-12-02 DIAGNOSIS — Z1231 Encounter for screening mammogram for malignant neoplasm of breast: Secondary | ICD-10-CM

## 2020-12-07 ENCOUNTER — Telehealth: Payer: Self-pay

## 2020-12-07 NOTE — Telephone Encounter (Signed)
Thanks for the update

## 2020-12-07 NOTE — Telephone Encounter (Signed)
  Pt called and stated that she took metoprolol this morning like she always does. She said that round 2:30pm her afib felt like it had come back.Her heart rate is around 112 currently. She took a second metoprolol around 2:30 when her symptoms started. Pt wants to know if she should take another metoprolol later. She is unsure of what to do. Please advise.

## 2020-12-07 NOTE — Telephone Encounter (Signed)
Patient called again and stated she took a second metoprolol, and she is no longer experiencing afib/afib symptoms.

## 2020-12-14 DIAGNOSIS — H524 Presbyopia: Secondary | ICD-10-CM | POA: Diagnosis not present

## 2020-12-14 DIAGNOSIS — Z0100 Encounter for examination of eyes and vision without abnormal findings: Secondary | ICD-10-CM | POA: Diagnosis not present

## 2020-12-16 DIAGNOSIS — M25511 Pain in right shoulder: Secondary | ICD-10-CM | POA: Diagnosis not present

## 2021-01-11 ENCOUNTER — Encounter: Payer: Medicare HMO | Admitting: Physical Medicine & Rehabilitation

## 2021-01-12 DIAGNOSIS — F3181 Bipolar II disorder: Secondary | ICD-10-CM | POA: Diagnosis not present

## 2021-01-12 DIAGNOSIS — F4312 Post-traumatic stress disorder, chronic: Secondary | ICD-10-CM | POA: Diagnosis not present

## 2021-01-12 DIAGNOSIS — G2401 Drug induced subacute dyskinesia: Secondary | ICD-10-CM | POA: Diagnosis not present

## 2021-01-16 DIAGNOSIS — R102 Pelvic and perineal pain: Secondary | ICD-10-CM | POA: Diagnosis not present

## 2021-01-16 DIAGNOSIS — N950 Postmenopausal bleeding: Secondary | ICD-10-CM | POA: Diagnosis not present

## 2021-01-20 ENCOUNTER — Telehealth: Payer: Self-pay | Admitting: Hematology and Oncology

## 2021-01-20 NOTE — Telephone Encounter (Signed)
Scheduled appt per 11/11 referral. Pt is aware of appt date and time.  

## 2021-02-13 ENCOUNTER — Ambulatory Visit: Payer: Medicare HMO | Admitting: Cardiology

## 2021-02-16 ENCOUNTER — Ambulatory Visit: Payer: Medicare HMO | Admitting: Cardiology

## 2021-02-16 ENCOUNTER — Other Ambulatory Visit: Payer: Self-pay

## 2021-02-16 ENCOUNTER — Encounter: Payer: Self-pay | Admitting: Cardiology

## 2021-02-16 VITALS — BP 143/84 | HR 54 | Resp 16 | Ht 63.0 in | Wt 189.0 lb

## 2021-02-16 DIAGNOSIS — Z86718 Personal history of other venous thrombosis and embolism: Secondary | ICD-10-CM

## 2021-02-16 DIAGNOSIS — I480 Paroxysmal atrial fibrillation: Secondary | ICD-10-CM

## 2021-02-16 DIAGNOSIS — R0602 Shortness of breath: Secondary | ICD-10-CM | POA: Diagnosis not present

## 2021-02-16 DIAGNOSIS — Z86711 Personal history of pulmonary embolism: Secondary | ICD-10-CM | POA: Diagnosis not present

## 2021-02-16 DIAGNOSIS — R072 Precordial pain: Secondary | ICD-10-CM

## 2021-02-16 DIAGNOSIS — Z8249 Family history of ischemic heart disease and other diseases of the circulatory system: Secondary | ICD-10-CM

## 2021-02-16 DIAGNOSIS — Z7901 Long term (current) use of anticoagulants: Secondary | ICD-10-CM | POA: Diagnosis not present

## 2021-02-16 DIAGNOSIS — Z01810 Encounter for preprocedural cardiovascular examination: Secondary | ICD-10-CM | POA: Diagnosis not present

## 2021-02-16 DIAGNOSIS — R9431 Abnormal electrocardiogram [ECG] [EKG]: Secondary | ICD-10-CM | POA: Diagnosis not present
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
## 2021-02-20 ENCOUNTER — Telehealth: Payer: Self-pay

## 2021-02-20 ENCOUNTER — Other Ambulatory Visit: Payer: Medicare HMO

## 2021-02-20 NOTE — Telephone Encounter (Signed)
Received PA approval for Aimovig 70mg  through 03/11/2022.

## 2021-02-21 ENCOUNTER — Other Ambulatory Visit (HOSPITAL_COMMUNITY): Payer: Medicare HMO

## 2021-02-21 ENCOUNTER — Encounter: Payer: Medicare HMO | Admitting: Hematology and Oncology

## 2021-02-22 ENCOUNTER — Other Ambulatory Visit: Payer: Medicare HMO

## 2021-02-27 ENCOUNTER — Ambulatory Visit: Admit: 2021-02-27 | Payer: Medicare HMO | Admitting: Obstetrics and Gynecology

## 2021-02-27 ENCOUNTER — Other Ambulatory Visit: Payer: Self-pay

## 2021-02-27 ENCOUNTER — Ambulatory Visit: Payer: Medicare HMO

## 2021-02-27 DIAGNOSIS — R9431 Abnormal electrocardiogram [ECG] [EKG]: Secondary | ICD-10-CM | POA: Diagnosis not present

## 2021-02-27 DIAGNOSIS — R072 Precordial pain: Secondary | ICD-10-CM | POA: Diagnosis not present

## 2021-02-27 DIAGNOSIS — I480 Paroxysmal atrial fibrillation: Secondary | ICD-10-CM | POA: Diagnosis not present

## 2021-02-27 DIAGNOSIS — R0602 Shortness of breath: Secondary | ICD-10-CM

## 2021-02-27 DIAGNOSIS — Z01810 Encounter for preprocedural cardiovascular examination: Secondary | ICD-10-CM

## 2021-02-27 DIAGNOSIS — Z7901 Long term (current) use of anticoagulants: Secondary | ICD-10-CM | POA: Diagnosis not present

## 2021-02-27 SURGERY — DILATATION & CURETTAGE/HYSTEROSCOPY WITH MYOSURE
Anesthesia: Choice

## 2021-02-28 LAB — CMP14+EGFR
ALT: 13 IU/L (ref 0–32)
AST: 21 IU/L (ref 0–40)
Albumin/Globulin Ratio: 1.7 (ref 1.2–2.2)
Albumin: 4.6 g/dL (ref 3.8–4.9)
Alkaline Phosphatase: 90 IU/L (ref 44–121)
BUN/Creatinine Ratio: 11 (ref 9–23)
BUN: 11 mg/dL (ref 6–24)
Bilirubin Total: 0.5 mg/dL (ref 0.0–1.2)
CO2: 22 mmol/L (ref 20–29)
Calcium: 9.4 mg/dL (ref 8.7–10.2)
Chloride: 106 mmol/L (ref 96–106)
Creatinine, Ser: 1.04 mg/dL — ABNORMAL HIGH (ref 0.57–1.00)
Globulin, Total: 2.7 g/dL (ref 1.5–4.5)
Glucose: 102 mg/dL — ABNORMAL HIGH (ref 70–99)
Potassium: 4.7 mmol/L (ref 3.5–5.2)
Sodium: 142 mmol/L (ref 134–144)
Total Protein: 7.3 g/dL (ref 6.0–8.5)
eGFR: 62 mL/min/{1.73_m2} (ref >59)

## 2021-02-28 LAB — LIPID PANEL WITH LDL/HDL RATIO
Cholesterol, Total: 193 mg/dL (ref 100–199)
HDL: 60 mg/dL (ref >39)
LDL Chol Calc (NIH): 115 mg/dL — ABNORMAL HIGH (ref 0–99)
LDL/HDL Ratio: 1.9 ratio (ref 0.0–3.2)
Triglycerides: 100 mg/dL (ref 0–149)
VLDL Cholesterol Cal: 18 mg/dL (ref 5–40)

## 2021-02-28 LAB — HEMOGLOBIN AND HEMATOCRIT, BLOOD
Hematocrit: 43.1 % (ref 34.0–46.6)
Hemoglobin: 14 g/dL (ref 11.1–15.9)

## 2021-02-28 LAB — BRAIN NATRIURETIC PEPTIDE: BNP: 50.9 pg/mL (ref 0.0–100.0)

## 2021-02-28 LAB — TSH: TSH: 1.82 u[IU]/mL (ref 0.450–4.500)

## 2021-02-28 LAB — LDL CHOLESTEROL, DIRECT: LDL Direct: 111 mg/dL — ABNORMAL HIGH (ref 0–99)

## 2021-03-10 NOTE — Progress Notes (Signed)
Called pt to inform her about her lab results. Pt will make an appt with you

## 2021-03-14 NOTE — Progress Notes (Signed)
Attempted to call pt, no answer. Left vm requesting call back.

## 2021-03-15 ENCOUNTER — Telehealth: Payer: Self-pay

## 2021-03-15 NOTE — Progress Notes (Signed)
Called and spoke to pt, pt voiced understanding.

## 2021-03-15 NOTE — Telephone Encounter (Signed)
Please check up on her to see how she is doing.   Continue metoprolol for now; we can discuss increasing dose or changing it to different medication at the next visit (please schedule a follow up visit).   Heart monitor will give [place] more information regarding the rhythm as opposed to just what her HR is based on her watch. So if she agree please schedule a Holter for 7 days and follow up office visit two week after she mails the monitor back.   She recently had a stress test which was low risk. However, if she continues to have chest pain please advise her to go to ER for further evaluation. Same applies for headache and slurred speech.   Thank-you.

## 2021-03-17 NOTE — Telephone Encounter (Signed)
Tried calling patient no answer left a vm to call back

## 2021-03-20 NOTE — Telephone Encounter (Signed)
Unable to reach her over the phone. Left a voicemail.  ST

## 2021-03-24 NOTE — Telephone Encounter (Signed)
Called the patient today and reached her voicemail.  Dr.Jobani Sabado

## 2021-03-29 NOTE — Telephone Encounter (Signed)
Tried calling patient today and reached her voicemail. I left a voicemail patient is scheduled to come in 1/20

## 2021-03-30 ENCOUNTER — Ambulatory Visit: Payer: Medicare HMO | Admitting: Cardiology

## 2021-03-31 ENCOUNTER — Other Ambulatory Visit: Payer: Self-pay

## 2021-03-31 ENCOUNTER — Encounter: Payer: Self-pay | Admitting: Cardiology

## 2021-03-31 ENCOUNTER — Ambulatory Visit: Payer: Medicare HMO | Admitting: Cardiology

## 2021-03-31 VITALS — BP 125/80 | HR 60 | Temp 97.7°F | Resp 16 | Ht 63.0 in | Wt 190.2 lb

## 2021-03-31 DIAGNOSIS — I480 Paroxysmal atrial fibrillation: Secondary | ICD-10-CM | POA: Diagnosis not present

## 2021-03-31 DIAGNOSIS — Z7901 Long term (current) use of anticoagulants: Secondary | ICD-10-CM | POA: Diagnosis not present

## 2021-03-31 DIAGNOSIS — Z8249 Family history of ischemic heart disease and other diseases of the circulatory system: Secondary | ICD-10-CM

## 2021-03-31 DIAGNOSIS — R0602 Shortness of breath: Secondary | ICD-10-CM

## 2021-03-31 DIAGNOSIS — Z86711 Personal history of pulmonary embolism: Secondary | ICD-10-CM

## 2021-03-31 DIAGNOSIS — Z01810 Encounter for preprocedural cardiovascular examination: Secondary | ICD-10-CM

## 2021-03-31 DIAGNOSIS — R072 Precordial pain: Secondary | ICD-10-CM

## 2021-03-31 DIAGNOSIS — Z86718 Personal history of other venous thrombosis and embolism: Secondary | ICD-10-CM | POA: Diagnosis not present
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
## 2021-04-06 ENCOUNTER — Telehealth: Payer: Self-pay | Admitting: Cardiology

## 2021-04-06 NOTE — Telephone Encounter (Signed)
Patient wanted to pass along some information to you. Says tylenol by itself does not relieve chest pain, but when she takes tylenol in addition to metoprolol, this helps with her chest pain. She also wanted to relay that she has scheduled the calcium score appointment.

## 2021-04-14 NOTE — Telephone Encounter (Signed)
Call the patient this afternoon who states that the chest pain has improved significantly after taking Tylenol with her daily dose of metoprolol.  Shared decision was to proceed with her planned GYN procedure as she is considered an acceptable risk from a cardiovascular standpoint.  Recent echo and stress test results were reviewed with her again over the telephone encounter..   Dr. Terri Skains

## 2021-04-17 ENCOUNTER — Telehealth: Payer: Self-pay

## 2021-04-20 NOTE — Telephone Encounter (Signed)
Called patient no answer left a vm to call back

## 2021-04-20 NOTE — Telephone Encounter (Signed)
She has PAF so intermittent episodes will occur.  Only if symptomatic have her inform us.  I would like to see her after her procedure.   Dr. Terri Skains

## 2021-04-21 NOTE — Telephone Encounter (Signed)
Attempted to call pt, no answer. Unable to leave vm requesting call back

## 2021-04-26 ENCOUNTER — Telehealth: Payer: Self-pay | Admitting: Hematology

## 2021-04-26 ENCOUNTER — Other Ambulatory Visit: Payer: Self-pay

## 2021-04-26 NOTE — Telephone Encounter (Signed)
Scheduled appt per 1/29 referral. Pt is aware of appt date and time. Pt is aware to arrive 15 mins prior to appt time and to bring and updated insurance card. Pt is aware of appt location.   

## 2021-05-09 DIAGNOSIS — G2401 Drug induced subacute dyskinesia: Secondary | ICD-10-CM | POA: Diagnosis not present

## 2021-05-09 DIAGNOSIS — F4312 Post-traumatic stress disorder, chronic: Secondary | ICD-10-CM | POA: Diagnosis not present

## 2021-05-09 DIAGNOSIS — F3181 Bipolar II disorder: Secondary | ICD-10-CM | POA: Diagnosis not present

## 2021-05-09 DIAGNOSIS — N950 Postmenopausal bleeding: Secondary | ICD-10-CM | POA: Diagnosis not present

## 2021-05-09 DIAGNOSIS — R4189 Other symptoms and signs involving cognitive functions and awareness: Secondary | ICD-10-CM | POA: Diagnosis not present

## 2021-05-09 DIAGNOSIS — Z01818 Encounter for other preprocedural examination: Secondary | ICD-10-CM | POA: Diagnosis not present

## 2021-05-11 ENCOUNTER — Telehealth: Payer: Self-pay

## 2021-05-11 NOTE — Telephone Encounter (Signed)
I spoke to patient gave her the message the call got lost. I will try calling patient again to make sure she understood.

## 2021-05-11 NOTE — Telephone Encounter (Signed)
Majority of her medications are noncardiac, the only cardiac medications that I see based on the last medication reconciliation metoprolol (please confirm).  ? ?I will defer the management of her psychiatric medications and their side effects to her psychiatrist. ? ?Palmer Shorey Terri Skains, DO, [facility]

## 2021-05-12 ENCOUNTER — Telehealth: Payer: Self-pay

## 2021-05-12 DIAGNOSIS — I480 Paroxysmal atrial fibrillation: Secondary | ICD-10-CM

## 2021-05-15 ENCOUNTER — Telehealth: Payer: Self-pay | Admitting: Hematology

## 2021-05-15 NOTE — Telephone Encounter (Signed)
Cancelled per 3/6 in basket, pt has been called and confirmed cancellation. I offered to r/s but pt did not want to at his time ?

## 2021-05-16 ENCOUNTER — Inpatient Hospital Stay: Payer: Medicare HMO | Admitting: Hematology

## 2021-05-19 NOTE — Telephone Encounter (Signed)
Patient called the office with regards to recommendations if you should increase the dose Lexapro as requested by her psychiatrist.  When she came to the office that she was on Lexapro 10 mg p.o. daily and recently has been increased to 40 mg p.o. daily. ? ?She is also on other psychotropic medications such as Sinemet, Lexapro, Lamictal, Seroquel, Topamax.  I have informed her that these medications can affect different segments of the EKG and do impact the conduction system of the heart.  And she should be monitored carefully. ? ?But if she is requesting assistance with the correct dose of Lexapro I will have to defer that to her psychiatrist. ? ?EKG from January 2023 reviewed as part of today's telephone encounter. ? ?Patient's questions and concerns were addressed to her satisfaction.  She was thankful for the call back. ? ?[name], DO, FACC ? ?Pager: [phone] ?Office: [phone] ? ?

## 2021-05-19 NOTE — Telephone Encounter (Signed)
Returning the patient's call to address her question regarding Lexapro.   ?It went into voicemail. ? ? ?Earlie Arciga, DO, FACC ? ? ?

## 2021-05-23 DIAGNOSIS — R413 Other amnesia: Secondary | ICD-10-CM | POA: Diagnosis not present

## 2021-06-07 DIAGNOSIS — F3132 Bipolar disorder, current episode depressed, moderate: Secondary | ICD-10-CM | POA: Diagnosis not present

## 2021-06-07 DIAGNOSIS — F411 Generalized anxiety disorder: Secondary | ICD-10-CM | POA: Diagnosis not present

## 2021-06-20 ENCOUNTER — Telehealth: Payer: Self-pay | Admitting: Hematology and Oncology

## 2021-06-20 NOTE — Telephone Encounter (Signed)
Pt called in to r/s appt from March. Pt is aware of new appt date and time.  ?

## 2021-06-21 ENCOUNTER — Ambulatory Visit: Payer: Medicare HMO | Admitting: Behavioral Health

## 2021-06-28 NOTE — Progress Notes (Incomplete)
[facility] ?CONSULT NOTE ? ?Patient Care Team: ?[name], MD as PCP - General (Internal Medicine) ?Noemi Chapel, NP as Nurse Practitioner (Psychiatry) ?Drema Dallas, DO (Obstetrics and Gynecology) ? ?CHIEF COMPLAINTS/PURPOSE OF CONSULTATION:  Pulmonary Embolism  ? ? ?HISTORY OF PRESENTING ILLNESS:  ?Emily Rice 61 y.o. female is here because of recent diagnosis of Pulmonary Embolism. She presents to the clinic today for consult and labs  ? ?I reviewed her records extensively and collaborated the history with the patient. ? ?SUMMARY OF ONCOLOGIC HISTORY: ?Oncology History  ? No history exists.  ? ? ? ?MEDICAL HISTORY:  ?Past Medical History:  ?Diagnosis Date  ? A-fib ([place])   ? Anxiety   ? Bipolar disorder ([place])   ? Depression   ? DVT (deep venous thrombosis) ([place]) 2001 and 2002  ? DVT (deep venous thrombosis) ([facility])   ? x 2  ? Migraines   ? Pulmonary embolism ([place]) 2001  ? Tremor   ? ? ?SURGICAL HISTORY: ?Past Surgical History:  ?Procedure Laterality Date  ? CHOLECYSTECTOMY  2014  ? CHOLECYSTECTOMY    ? HUMERUS IM NAIL Right 03/03/2020  ? Procedure: INTRAMEDULLARY (IM) NAIL HUMERAL;  Surgeon: Shona Needles, MD;  Location: [facility];  Service: Orthopedics;  Laterality: Right;  ? OVARIAN CYST REMOVAL    ? TONSILLECTOMY    ? ? ?SOCIAL HISTORY: ?Social History  ? ?Socioeconomic History  ? Marital status: Married  ?  Spouse name: Not on file  ? Number of children: 2  ? Years of education: college  ? Highest education level: Not on file  ?Occupational History  ? 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 Smoking status: Never  ? Smokeless tobacco: Never  ?Vaping Use  ? Vaping Use: Never used  ?Substance and Sexual Activity  ? Alcohol use: Not Currently  ?  Alcohol/week: 2.0 standard drinks  ?  Types: 2 Glasses of wine per week  ?  Comment: occasional  ? Drug use: Never  ? Sexual activity: Not on file  ?Other Topics Concern  ? Not on file  ?Social History Narrative  ? ** Merged History  Encounter **  ?    ? Lives with husband. ?Right-handed. ?No daily use of caffeine. ? ?  ? ?Social Determinants of Health  ? ?Financial Resource Strain: Not on file  ?Food Insecurity: Not on file  ?Transportation Needs: Not on file  ?Physical Activity: Not on file  ?Stress: Not on file  ?Social Connections: Not on file  ?Intimate Partner Violence: Not on file  ? ? ?FAMILY HISTORY: ?Family History  ?Problem Relation Age of Onset  ? Hypertension Mother   ? Stroke Father   ? Hypertension Father   ? Atrial fibrillation Father   ? Diabetes Sister   ? CAD Brother   ? Hypertension Maternal Uncle   ? ? ?ALLERGIES:  is allergic to compazine [prochlorperazine], hydromorphone, hydromorphone, iodinated contrast media, prochlorperazine, and prochlorperazine edisylate. ? ?MEDICATIONS:  ?Current Outpatient Medications  ?Medication Sig Dispense Refill  ? acetaminophen (TYLENOL) 325 MG tablet Take 2 tablets (650 mg total) by mouth every 6 (six) hours as needed for mild pain or moderate pain.    ? ALPRAZolam (XANAX) 1 MG tablet Take a 1/2 tablet(0.5 mg total) in the am, take a 1/2 tablet(0.5 mg total) in the pm 60 tablet 1  ? apixaban (ELIQUIS) 5 MG TABS tablet Take 1 tablet (5 mg total) by mouth 2 (two) times daily.  60 tablet 3  ? carbidopa-levodopa (SINEMET IR) 25-100 MG tablet Take one tablet in the am and one tablet in the pm 60 tablet 4  ? Cholecalciferol 75 MCG (3000 UT) TABS Take 3,000 Units by mouth daily. 30 tablet 0  ? escitalopram (LEXAPRO) 10 MG tablet Take 1 tablet (10 mg total) by mouth at bedtime. 30 tablet 4  ? fexofenadine (ALLEGRA) 180 MG tablet Take 180 mg by mouth as needed for allergies or rhinitis.    ? fluticasone (FLONASE) 50 MCG/ACT nasal spray Place 1 spray into both nostrils daily as needed for allergies.     ? lamoTRIgine (LAMICTAL) 200 MG tablet Take 1 tablet (200 mg total) by mouth 2 (two) times daily. 60 tablet 4  ? metoprolol succinate (TOPROL XL) 25 MG 24 hr tablet Take 1 tablet (25 mg total) by mouth  daily. Hold if systolic blood pressure less than 100 mmHg or heart rate less than 60 bpm. 235 tablet 1  ? Multiple Vitamin (MULTIVITAMIN WITH MINERALS) TABS tablet Take 1 tablet by mouth daily.    ? polyethylene glycol (MIRALAX / GLYCOLAX) 17 g packet Take 17 g by mouth 2 (two) times daily. 14 each 0  ? QUEtiapine (SEROQUEL) 100 MG tablet Take 1 tablet (100 mg total) by mouth at bedtime. 30 tablet 0  ? topiramate (TOPAMAX) 50 MG tablet Take 1 tablet by mouth daily.    ? ?No current facility-administered medications for this visit.  ? ? ?REVIEW OF SYSTEMS:   ?Constitutional: Denies fevers, chills or abnormal night sweats ?Eyes: Denies blurriness of vision, double vision or watery eyes ?Ears, nose, mouth, throat, and face: Denies mucositis or sore throat ?Respiratory: Denies cough, dyspnea or wheezes ?Cardiovascular: Denies palpitation, chest discomfort or lower extremity swelling ?Gastrointestinal:  Denies nausea, heartburn or change in bowel habits ?Skin: Denies abnormal skin rashes ?Lymphatics: Denies new lymphadenopathy or easy bruising ?Neurological:Denies numbness, tingling or new weaknesses ?Behavioral/Psych: Mood is stable, no new changes  ?Breast: *** Denies any palpable lumps or discharge ?All other systems were reviewed with the patient and are negative. ? ?PHYSICAL EXAMINATION: ?ECOG PERFORMANCE STATUS: {CHL ONC ECOG CV:[id number]} ? ?There were no vitals filed for this visit. ?There were no vitals filed for this visit. ? ?GENERAL:alert, no distress and comfortable ?SKIN: skin color, texture, turgor are normal, no rashes or significant lesions ?EYES: normal, conjunctiva are pink and non-injected, sclera clear ?OROPHARYNX:no exudate, no erythema and lips, buccal mucosa, and tongue normal  ?NECK: supple, thyroid normal size, non-tender, without nodularity ?LYMPH:  no palpable lymphadenopathy in the cervical, axillary or inguinal ?LUNGS: clear to auscultation and percussion with normal breathing  effort ?HEART: regular rate & rhythm and no murmurs and no lower extremity edema ?ABDOMEN:abdomen soft, non-tender and normal bowel sounds ?Musculoskeletal:no cyanosis of digits and no clubbing  ?PSYCH: alert & oriented x 3 with fluent speech ?NEURO: no focal motor/sensory deficits ? ?LABORATORY DATA:  ?I have reviewed the data as listed ?Lab Results  ?Component Value Date  ? WBC 6.0 04/01/2020  ? HGB 14.0 02/27/2021  ? HCT 43.1 02/27/2021  ? MCV 89.7 04/01/2020  ? PLT 164 04/01/2020  ? ?Lab Results  ?Component Value Date  ? NA 142 02/27/2021  ? K 4.7 02/27/2021  ? CL 106 02/27/2021  ? CO2 22 02/27/2021  ? ? ?RADIOGRAPHIC STUDIES: ?I have personally reviewed the radiological reports and agreed with the findings in the report. ? ?ASSESSMENT AND PLAN:  ?No problem-specific Assessment & Plan notes found for this encounter. ? ? ?  All questions were answered. The patient knows to call the clinic with any problems, questions or concerns. ?  ? Suzzette Righter, CMA ?06/28/21 ?  ?

## 2021-06-30 ENCOUNTER — Telehealth: Payer: Self-pay

## 2021-06-30 DIAGNOSIS — F3132 Bipolar disorder, current episode depressed, moderate: Secondary | ICD-10-CM | POA: Diagnosis not present

## 2021-06-30 DIAGNOSIS — F411 Generalized anxiety disorder: Secondary | ICD-10-CM | POA: Diagnosis not present

## 2021-06-30 NOTE — Telephone Encounter (Signed)
Tried calling patient no answer left a vm to call back

## 2021-06-30 NOTE — Telephone Encounter (Signed)
Please call her and I will speak to her.  ? ?ST

## 2021-07-05 ENCOUNTER — Emergency Department (HOSPITAL_COMMUNITY): Payer: Medicare HMO

## 2021-07-05 ENCOUNTER — Other Ambulatory Visit: Payer: Self-pay

## 2021-07-05 ENCOUNTER — Emergency Department (HOSPITAL_COMMUNITY)
Admission: EM | Admit: 2021-07-05 | Discharge: 2021-07-06 | Disposition: A | Discharge status: Home / Self Care | Payer: Medicare HMO | Attending: Emergency Medicine | Admitting: Emergency Medicine

## 2021-07-05 DIAGNOSIS — M79603 Pain in arm, unspecified: Secondary | ICD-10-CM | POA: Diagnosis not present

## 2021-07-05 DIAGNOSIS — J811 Chronic pulmonary edema: Secondary | ICD-10-CM | POA: Diagnosis not present

## 2021-07-05 DIAGNOSIS — Z7901 Long term (current) use of anticoagulants: Secondary | ICD-10-CM | POA: Insufficient documentation

## 2021-07-05 DIAGNOSIS — I480 Paroxysmal atrial fibrillation: Secondary | ICD-10-CM | POA: Diagnosis not present

## 2021-07-05 DIAGNOSIS — R000 Tachycardia, unspecified: Secondary | ICD-10-CM | POA: Diagnosis not present

## 2021-07-05 DIAGNOSIS — R0602 Shortness of breath: Secondary | ICD-10-CM | POA: Diagnosis not present

## 2021-07-05 DIAGNOSIS — R079 Chest pain, unspecified: Secondary | ICD-10-CM | POA: Diagnosis not present

## 2021-07-05 LAB — BASIC METABOLIC PANEL
Anion gap: 10 (ref 5–15)
BUN: 9 mg/dL (ref 6–20)
CO2: 22 mmol/L (ref 22–32)
Calcium: 8.9 mg/dL (ref 8.9–10.3)
Chloride: 108 mmol/L (ref 98–111)
Creatinine, Ser: 0.84 mg/dL (ref 0.44–1.00)
GFR, Estimated: >60 mL/min (ref >60)
Glucose, Bld: 93 mg/dL (ref 70–99)
Potassium: 3.8 mmol/L (ref 3.5–5.1)
Sodium: 140 mmol/L (ref 135–145)

## 2021-07-05 LAB — CBC
HCT: 38.2 % (ref 36.0–46.0)
Hemoglobin: 12.7 g/dL (ref 12.0–15.0)
MCH: 28.2 pg (ref 26.0–34.0)
MCHC: 33.2 g/dL (ref 30.0–36.0)
MCV: 84.7 fL (ref 80.0–100.0)
Platelets: 208 10*3/uL (ref 150–400)
RBC: 4.51 MIL/uL (ref 3.87–5.11)
RDW: 15 % (ref 11.5–15.5)
WBC: 7 10*3/uL (ref 4.0–10.5)
nRBC: 0 % (ref 0.0–0.2)

## 2021-07-05 LAB — TROPONIN I (HIGH SENSITIVITY)
Troponin I (High Sensitivity): 4 ng/L (ref <18)
Troponin I (High Sensitivity): 4 ng/L (ref <18)

## 2021-07-05 LAB — BRAIN NATRIURETIC PEPTIDE: B Natriuretic Peptide: 59.9 pg/mL (ref 0.0–100.0)

## 2021-07-05 MED ORDER — ACETAMINOPHEN 325 MG PO TABS
650.0000 mg | ORAL_TABLET | Freq: Once | ORAL | Status: AC
  Administered 2021-07-05: 650 mg via ORAL
  Filled 2021-07-05: qty 2

## 2021-07-05 MED ORDER — DIPHENHYDRAMINE HCL 25 MG PO CAPS
50.0000 mg | ORAL_CAPSULE | Freq: Once | ORAL | Status: AC
  Administered 2021-07-06: 50 mg via ORAL
  Filled 2021-07-05: qty 2

## 2021-07-05 MED ORDER — DILTIAZEM HCL 25 MG/5ML IV SOLN
10.0000 mg | Freq: Once | INTRAVENOUS | Status: AC
  Administered 2021-07-05: 10 mg via INTRAVENOUS
  Filled 2021-07-05: qty 5

## 2021-07-05 MED ORDER — HYDROCORTISONE SOD SUC (PF) 250 MG IJ SOLR
200.0000 mg | Freq: Once | INTRAMUSCULAR | Status: AC
  Administered 2021-07-05: 200 mg via INTRAVENOUS
  Filled 2021-07-05: qty 200

## 2021-07-05 MED ORDER — DIPHENHYDRAMINE HCL 50 MG/ML IJ SOLN: 50.0000 mg | Freq: Once | INTRAMUSCULAR | Status: AC

## 2021-07-05 MED ORDER — ALPRAZOLAM 0.25 MG PO TABS
0.5000 mg | ORAL_TABLET | Freq: Once | ORAL | Status: AC
Start: 2021-07-05 — End: 2021-07-05
  Administered 2021-07-05: 0.5 mg via ORAL
  Filled 2021-07-05: qty 2

## 2021-07-05 NOTE — ED Triage Notes (Signed)
Pt also complains of cp and nausea starting today ?

## 2021-07-05 NOTE — ED Triage Notes (Signed)
Pt bib [facility] from home with complaints of shob that has been ongoing for 3 weeks. Shob has worsened today. Pt has a history of blood clots and afib.  ?EMS vitals: 138/92, 100-130afib, 100%RA, 22R ?
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
## 2021-07-06 ENCOUNTER — Emergency Department (HOSPITAL_COMMUNITY): Payer: Medicare HMO

## 2021-07-06 DIAGNOSIS — I480 Paroxysmal atrial fibrillation: Secondary | ICD-10-CM | POA: Diagnosis not present

## 2021-07-06 DIAGNOSIS — R0602 Shortness of breath: Secondary | ICD-10-CM | POA: Diagnosis not present

## 2021-07-06 MED ORDER — DILTIAZEM HCL ER COATED BEADS 120 MG PO CP24: 120.0000 mg | ORAL_CAPSULE | Freq: Every day | ORAL | 0 refills | Status: DC

## 2021-07-06 MED ORDER — IOHEXOL 350 MG/ML SOLN
50.0000 mL | Freq: Once | INTRAVENOUS | Status: AC | PRN
  Administered 2021-07-06: 50 mL via INTRAVENOUS

## 2021-07-06 NOTE — ED Provider Notes (Signed)
61 year old female received in signout from Dr. Tomi Bamberger, briefly the patient had been complaining of chest discomfort.  Seems to be off and on.  2 troponins are negative plan for a CT angiogram of the chest if this is negative would discharge home. ? ?CT scan of the chest is resulted no PE no occult pneumonia.  I discussed results with the patient.  She tells me that she had a discussion with Dr. Tomi Bamberger and had plan to switch her from metoprolol to diltiazem.  We will have her call her cardiologist in the morning. ?  Deno Etienne, DO ?07/06/21 0236 ? ?

## 2021-07-06 NOTE — Discharge Instructions (Signed)
Please follow-up with your cardiologist in the office.  I will give them a call this morning and let them know how you are doing.  If you are planning on taking the Cardizem then please stop taking the metoprolol.  Please return for worsening symptoms or if they occur while you are exercising. ?

## 2021-07-07 ENCOUNTER — Telehealth: Payer: Self-pay | Admitting: Cardiology

## 2021-07-07 DIAGNOSIS — I480 Paroxysmal atrial fibrillation: Secondary | ICD-10-CM

## 2021-07-07 DIAGNOSIS — R072 Precordial pain: Secondary | ICD-10-CM | POA: Diagnosis not present

## 2021-07-07 NOTE — Telephone Encounter (Signed)
Patient was recently seen at [facility]. Patient was instructed to contact our office after being discharged. It looks like she has been switched from metoprolol to diltiazem per Dr. Tomi Bamberger. I informed patient that you would likely want to see her in the next week or two since she was hospitalized, but patient says she can hardly walk/get around right now. What would you like for [place] to do moving forward? ?

## 2021-07-07 NOTE — Telephone Encounter (Signed)
From patient. Please advise.

## 2021-07-08 DIAGNOSIS — F411 Generalized anxiety disorder: Secondary | ICD-10-CM | POA: Diagnosis not present

## 2021-07-08 DIAGNOSIS — F3132 Bipolar disorder, current episode depressed, moderate: Secondary | ICD-10-CM | POA: Diagnosis not present

## 2021-07-10 ENCOUNTER — Telehealth: Payer: Self-pay | Admitting: Hematology

## 2021-07-10 NOTE — Telephone Encounter (Signed)
R/s pt's new hem appt per pt request. Pt is aware of new appt date and time.  ?

## 2021-07-10 NOTE — Telephone Encounter (Signed)
Pt called back today. Patient was recently seen at [facility]. Patient was instructed to contact our office after being discharged. It looks like she has been switched from metoprolol to diltiazem per Dr. Tomi Bamberger. I informed patient that you would likely want to see her in the next week or two since she was hospitalized, but patient says she can hardly walk/get around right now. What would you like for [place] to do moving forward?

## 2021-07-11 NOTE — Telephone Encounter (Signed)
ON-CALL CARDIOLOGY ?07/11/21 ? ?Patient's name: Emily Rice.   ?MRN: [id number].    ?DOB: [birth year] ?Primary care provider: [name], MD. ?Primary cardiologist: [name], DO, [facility] ? ?Interaction regarding this patient's care today: ?Patient recently went to the ED for evaluation of shortness of breath, palpitations, and chest pain.  She was noted to be in A-fib w/ HR 104bpm.  Follow up EKG notes normal sinus rhythm w/ ST depressions in the anterior leads concerning for ischemia. ? ?Hstrop - x 2.  ? ?She was transitioned from metoprolol to diltiazem and is tolerating the medication well. ? ?Patient states that she still has breakthrough episodes of atrial fibrillation.  But no active chest pain/tightness. ? ? ?Impression: ?  ICD-10-CM   ?1. Paroxysmal atrial fibrillation (HCC)  I48.0   ?  ?2. Precordial pain  R07.2   ?  ? ? ?No orders of the defined types were placed in this encounter. ? ? ?No orders of the defined types were placed in this encounter. ? ? ?Recommendations: ?Patient states that she does not have a blood pressure cuff and would like a prescription for it so the insurance will pay for it.  Prescription will be sent to her pharmacy. ? ?Patient is asked to check her blood pressure over the next several days.  If her SBP is greater than 110 mmHg she is asked to increase her Cardizem dose 120 mg 2 tablets p.o. daily.  If she tolerates the higher dose of Cardizem she is asked to call us back and we can refill the prescription to Cardizem 240 mg p.o. daily. ? ?Currently she does not have any chest pain.  If she does patient is instructed to go back to the hospital via EMS for further evaluation and management.  In the interim I will have a follow-up visit scheduled with the practice. ? ?Telephone encounter total time: 10 minutes ? ?Wells Gerdeman [place], DO, FACC ? ?Pager: [phone] ?Office: [phone] ? ?

## 2021-07-12 ENCOUNTER — Encounter: Payer: Medicare HMO | Admitting: Hematology and Oncology

## 2021-07-12 NOTE — Telephone Encounter (Signed)
Patient call transferred to [facility], Clinic Pharmacist

## 2021-07-19 ENCOUNTER — Institutional Professional Consult (permissible substitution): Payer: Medicare HMO | Admitting: Neurology

## 2021-07-24 ENCOUNTER — Institutional Professional Consult (permissible substitution): Payer: Medicare HMO | Admitting: Neurology

## 2021-07-26 ENCOUNTER — Other Ambulatory Visit: Payer: Self-pay

## 2021-07-26 MED ORDER — DILTIAZEM HCL ER COATED BEADS 120 MG PO CP24: 120.0000 mg | ORAL_CAPSULE | Freq: Every day | ORAL | 0 refills | Status: DC

## 2021-07-27 ENCOUNTER — Inpatient Hospital Stay: Payer: Medicare HMO | Admitting: Hematology

## 2021-07-27 ENCOUNTER — Telehealth: Payer: Self-pay | Admitting: Hematology

## 2021-07-27 NOTE — Telephone Encounter (Signed)
Pt called in and said she was sick, unable to make her appt today. I asked if she would like to r/s, but she said she will call back at a later time to r/s. Appt cancelled per pt request.

## 2021-07-31 ENCOUNTER — Ambulatory Visit: Payer: Medicare HMO | Admitting: Neurology

## 2021-07-31 ENCOUNTER — Telehealth: Payer: Self-pay

## 2021-07-31 ENCOUNTER — Encounter: Payer: Self-pay | Admitting: Neurology

## 2021-07-31 ENCOUNTER — Telehealth: Payer: Self-pay | Admitting: Neurology

## 2021-07-31 VITALS — BP 136/86 | HR 76 | Ht 63.5 in | Wt 174.4 lb

## 2021-07-31 DIAGNOSIS — G43709 Chronic migraine without aura, not intractable, without status migrainosus: Secondary | ICD-10-CM

## 2021-07-31 DIAGNOSIS — I629 Nontraumatic intracranial hemorrhage, unspecified: Secondary | ICD-10-CM

## 2021-07-31 DIAGNOSIS — Z113 Encounter for screening for infections with a predominantly sexual mode of transmission: Secondary | ICD-10-CM | POA: Diagnosis not present

## 2021-07-31 DIAGNOSIS — Z79899 Other long term (current) drug therapy: Secondary | ICD-10-CM | POA: Diagnosis not present

## 2021-07-31 DIAGNOSIS — R413 Other amnesia: Secondary | ICD-10-CM | POA: Diagnosis not present

## 2021-07-31 DIAGNOSIS — I618 Other nontraumatic intracerebral hemorrhage: Secondary | ICD-10-CM | POA: Diagnosis not present

## 2021-07-31 DIAGNOSIS — F3160 Bipolar disorder, current episode mixed, unspecified: Secondary | ICD-10-CM

## 2021-07-31 DIAGNOSIS — R259 Unspecified abnormal involuntary movements: Secondary | ICD-10-CM | POA: Diagnosis not present

## 2021-07-31 NOTE — Telephone Encounter (Signed)
Patient left a voicemail this morning asking for a call to discuss her appointment this afternoon.  She reports that she is suffering from allergies today and wants to know if it is still okay to come to her appointment.  Please call patient as soon as possible.

## 2021-07-31 NOTE — Telephone Encounter (Signed)
I called patient. She has seasonal allergies and has been taking allegra and allergy eye drops. She denies having a fever. She will come to her appointment and wear a mask.

## 2021-07-31 NOTE — Addendum Note (Signed)
Addended by: Marcial Pacas on: 07/31/2021 08:16 PM   Modules accepted: Level of Service
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
## 2021-07-31 NOTE — Telephone Encounter (Signed)
Referral for Neuropsychology sent to [facility] [phone]. 

## 2021-08-03 ENCOUNTER — Telehealth: Payer: Self-pay | Admitting: Neurology

## 2021-08-03 NOTE — Telephone Encounter (Signed)
Emily Rice Emily Rice: [id number] exp. 08/03/21-09/02/21 sent to GI they will call the patient to schedule

## 2021-08-04 ENCOUNTER — Encounter: Payer: Self-pay | Admitting: Neurology

## 2021-08-08 ENCOUNTER — Encounter: Payer: Self-pay | Admitting: Cardiology

## 2021-08-08 ENCOUNTER — Ambulatory Visit: Payer: Medicare HMO | Admitting: Cardiology

## 2021-08-08 VITALS — BP 138/88 | HR 114 | Temp 98.0°F | Resp 17 | Ht 63.0 in | Wt 193.0 lb

## 2021-08-08 DIAGNOSIS — Z86711 Personal history of pulmonary embolism: Secondary | ICD-10-CM | POA: Diagnosis not present

## 2021-08-08 DIAGNOSIS — I4891 Unspecified atrial fibrillation: Secondary | ICD-10-CM | POA: Diagnosis not present

## 2021-08-08 DIAGNOSIS — Z86718 Personal history of other venous thrombosis and embolism: Secondary | ICD-10-CM | POA: Diagnosis not present

## 2021-08-08 DIAGNOSIS — R0602 Shortness of breath: Secondary | ICD-10-CM | POA: Diagnosis not present

## 2021-08-08 DIAGNOSIS — Z7901 Long term (current) use of anticoagulants: Secondary | ICD-10-CM | POA: Diagnosis not present

## 2021-08-08 MED ORDER — DILTIAZEM HCL ER COATED BEADS 240 MG PO CP24: 240.0000 mg | ORAL_CAPSULE | Freq: Every day | ORAL | 0 refills | Status: DC
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
## 2021-08-10 NOTE — Telephone Encounter (Signed)
Patient refused appointment with [facility] due to cost and [facility] not being in Network.

## 2021-08-11 LAB — ETHANOL

## 2021-08-11 LAB — TOPIRAMATE LEVEL: Topiramate Lvl: <1.5 ug/mL — ABNORMAL LOW (ref 2.0–25.0)

## 2021-08-11 LAB — HIV ANTIBODY (ROUTINE TESTING W REFLEX): HIV Screen 4th Generation wRfx: NONREACTIVE

## 2021-08-11 LAB — FOLATE: Folate: 12.1 ng/mL (ref >3.0)

## 2021-08-11 LAB — DRUG SCREEN 10 W/CONF, SERUM
Amphetamines, IA: NEGATIVE ng/mL
Barbiturates, IA: NEGATIVE ug/mL
Benzodiazepines, IA: POSITIVE ng/mL — AB
Cocaine & Metabolite, IA: NEGATIVE ng/mL
Methadone, IA: NEGATIVE ng/mL
Opiates, IA: NEGATIVE ng/mL
Oxycodones, IA: NEGATIVE ng/mL
Phencyclidine, IA: NEGATIVE ng/mL
Propoxyphene, IA: NEGATIVE ng/mL
THC(Marijuana) Metabolite, IA: NEGATIVE ng/mL

## 2021-08-11 LAB — BENZODIAZEPINES,MS,WB/SP RFX
7-Aminoclonazepam: NEGATIVE ng/mL
Alprazolam: 85.8 ng/mL
Benzodiazepines Confirm: POSITIVE
Chlordiazepoxide: NEGATIVE
Clonazepam: NEGATIVE ng/mL
Desalkylflurazepam: NEGATIVE ng/mL
Desmethylchlordiazepoxide: NEGATIVE
Desmethyldiazepam: NEGATIVE ng/mL
Diazepam: NEGATIVE ng/mL
Flurazepam: NEGATIVE ng/mL
Lorazepam: NEGATIVE ng/mL
Midazolam: NEGATIVE ng/mL
Oxazepam: NEGATIVE ng/mL
Temazepam: NEGATIVE ng/mL
Triazolam: NEGATIVE ng/mL

## 2021-08-11 LAB — C-REACTIVE PROTEIN: CRP: 10 mg/L (ref 0–10)

## 2021-08-11 LAB — VITAMIN B12: Vitamin B-12: 489 pg/mL (ref 232–1245)

## 2021-08-11 LAB — HGB A1C W/O EAG: Hgb A1c MFr Bld: 5.8 % — ABNORMAL HIGH (ref 4.8–5.6)

## 2021-08-11 LAB — ANA W/REFLEX IF POSITIVE: Anti Nuclear Antibody (ANA): NEGATIVE

## 2021-08-11 LAB — SEDIMENTATION RATE: Sed Rate: 36 mm/hr (ref 0–40)

## 2021-08-11 LAB — LAMOTRIGINE LEVEL: Lamotrigine Lvl: 10.6 ug/mL (ref 2.0–20.0)

## 2021-08-11 LAB — RPR: RPR Ser Ql: NONREACTIVE

## 2021-08-14 ENCOUNTER — Telehealth: Payer: Self-pay | Admitting: Neurology

## 2021-08-14 NOTE — Telephone Encounter (Signed)
Pt is asking if the order can be sent to [place]. [facility] Address: [facility] [address], [place], [place] [address] Hours:  Open ? Closes 5?PM Phone: [phone] Pt states she would prefer her MRI and EEG be done there, please call pt to discuss.

## 2021-08-17 ENCOUNTER — Telehealth: Payer: Self-pay | Admitting: Neurology

## 2021-08-17 NOTE — Telephone Encounter (Signed)
Please advise her that she may take Xanax 1 mg 1 tablet as needed prior to MRI  I reviewed her allergy list, she had a history of allergy to iodine, MRI contrast is gadolinium, should be okay,

## 2021-08-17 NOTE — Telephone Encounter (Addendum)
I called the pt and we discussed message from Dr. Krista Blue.  Pt verbalized understanding and agreement to this plan.  She understands she will need a driver for this appt.

## 2021-08-17 NOTE — Telephone Encounter (Signed)
I called the pt back. She sts she is scheduled for her MRI on 08/19/2021 and typically takes Ativan prior to imaging.   She sts this is the med given by her pcp in the past. She has Xanax 1 mg on hand at home with daily dose being 1/2 tablets in the am and 1/2 in the pm.   Wanted to know if Dr. Krista Blue would agree to the Ativan dosage for the day of her MRI or should she take additional dosage of the Xanax?  She also wanted to ask if the MRI with contrast is safe for her? She has had reactions in the past to so form of diagnostic dye but unsure of the details surrounding this.

## 2021-08-17 NOTE — Telephone Encounter (Signed)
Pt states where Dr Krista Blue initially referred her to does not accept her insurance, so she will be going to [place]. [facility] Address: [facility] [address], [place], [place] [address] Phone: [phone].  Pt asking for a call to discuss

## 2021-08-19 ENCOUNTER — Ambulatory Visit
Admission: RE | Admit: 2021-08-19 | Discharge: 2021-08-19 | Disposition: A | Discharge status: Home / Self Care | Payer: Medicare HMO | Source: Ambulatory Visit | Attending: Neurology | Admitting: Neurology

## 2021-08-19 DIAGNOSIS — I629 Nontraumatic intracranial hemorrhage, unspecified: Secondary | ICD-10-CM

## 2021-08-19 DIAGNOSIS — R259 Unspecified abnormal involuntary movements: Secondary | ICD-10-CM

## 2021-08-19 DIAGNOSIS — R413 Other amnesia: Secondary | ICD-10-CM

## 2021-08-19 DIAGNOSIS — F3160 Bipolar disorder, current episode mixed, unspecified: Secondary | ICD-10-CM

## 2021-08-19 DIAGNOSIS — G43709 Chronic migraine without aura, not intractable, without status migrainosus: Secondary | ICD-10-CM

## 2021-08-19 DIAGNOSIS — I618 Other nontraumatic intracerebral hemorrhage: Secondary | ICD-10-CM

## 2021-08-19 DIAGNOSIS — Z79899 Other long term (current) drug therapy: Secondary | ICD-10-CM

## 2021-08-19 MED ORDER — GADOBENATE DIMEGLUMINE 529 MG/ML IV SOLN
15.0000 mL | Freq: Once | INTRAVENOUS | Status: AC | PRN
  Administered 2021-08-19: 15 mL via INTRAVENOUS

## 2021-08-20 ENCOUNTER — Other Ambulatory Visit: Payer: Self-pay | Admitting: Cardiology

## 2021-08-22 ENCOUNTER — Telehealth: Payer: Self-pay | Admitting: Neurology

## 2021-08-22 ENCOUNTER — Other Ambulatory Visit: Payer: Medicare HMO | Admitting: *Deleted
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
## 2021-08-23 NOTE — Telephone Encounter (Signed)
LVM for pt about results of MRI. Asked her to call back if she has any further questions/concerns.

## 2021-08-24 ENCOUNTER — Telehealth: Payer: Self-pay

## 2021-08-24 ENCOUNTER — Ambulatory Visit: Payer: Medicare HMO | Admitting: Neurology

## 2021-08-24 ENCOUNTER — Other Ambulatory Visit: Payer: Medicare HMO | Admitting: *Deleted

## 2021-08-24 ENCOUNTER — Ambulatory Visit: Payer: Medicare HMO | Admitting: Cardiology

## 2021-08-24 DIAGNOSIS — I4891 Unspecified atrial fibrillation: Secondary | ICD-10-CM

## 2021-08-24 DIAGNOSIS — G43709 Chronic migraine without aura, not intractable, without status migrainosus: Secondary | ICD-10-CM

## 2021-08-24 DIAGNOSIS — F3160 Bipolar disorder, current episode mixed, unspecified: Secondary | ICD-10-CM

## 2021-08-24 DIAGNOSIS — Z79899 Other long term (current) drug therapy: Secondary | ICD-10-CM

## 2021-08-24 DIAGNOSIS — R259 Unspecified abnormal involuntary movements: Secondary | ICD-10-CM

## 2021-08-24 DIAGNOSIS — R4182 Altered mental status, unspecified: Secondary | ICD-10-CM

## 2021-08-24 DIAGNOSIS — R413 Other amnesia: Secondary | ICD-10-CM

## 2021-08-24 NOTE — Telephone Encounter (Signed)
Patient is calling stating that her Atrial Fibrillation symptoms are worsening she is experiencing palpitations, flutters that are more frequent than usual, shortness of breath and patient is stating she cannot walk without her being out of breath, no chest pain. She is taking one 240 mg of diltiazem and wants to know if it's safe to take 2 pills if needed. Please, advice. MM

## 2021-08-25 NOTE — Telephone Encounter (Signed)
Please send it in Cardizem CD 360 mg p.o. daily.  When she gets the higher dose of Cardizem please have her hold the current 240 mg tablet. She can come in for an office visit next week.  Mitsuko Luera Lucas, DO, [facility]

## 2021-08-29 ENCOUNTER — Telehealth: Payer: Self-pay | Admitting: Neurology

## 2021-08-29 NOTE — Telephone Encounter (Signed)
Please call patient, EEG was normal, there was incidental finding on the EKG channel, there is some irregularity noted, she may consider formal EKG evaluation if she has never done so in the past,

## 2021-08-29 NOTE — Telephone Encounter (Signed)
She is asking for her MRI brain w/wo and EEG results.   She is not going to continue follow up with our office but will still like to know the findings. She has decided to see gerontology at [facility] in the future.

## 2021-08-29 NOTE — Procedures (Signed)
   HISTORY: 61 year old female complains of memory loss confusion  TECHNIQUE:  This is a routine 16 channel EEG recording with one channel devoted to a limited EKG recording.  It was performed during wakefulness, drowsiness and asleep.  Hyperventilation and photic stimulation were performed as activating procedures.  There are minimum muscle and movement artifact noted.  Upon maximum arousal, posterior dominant waking rhythm consistent of rhythmic alpha range activity, Activities are symmetric over the bilateral posterior derivations and attenuated with eye opening.  Hyperventilation produced mild/moderate buildup with higher amplitude and the slower activities noted.  Photic stimulation did not alter the tracing.  During EEG recording, patient developed drowsiness and no deeper stages of sleep was achieved  During EEG recording, there was no epileptiform discharge noted.  EKG demonstrate sinus rhythm, with irregular heart rate of 56 bpm.  CONCLUSION: This is a  normal awake EEG.  There is no electrodiagnostic evidence of epileptiform discharge.  Marcial Pacas, M.D. Ph.D.  [facility] [place], [place] [address] Phone: [phone] Fax:      [phone]

## 2021-08-29 NOTE — Telephone Encounter (Signed)
Pt called and is needing to speak to the RN regarding her medications. Please advise.

## 2021-08-30 ENCOUNTER — Other Ambulatory Visit: Payer: Self-pay | Admitting: Cardiology

## 2021-08-30 DIAGNOSIS — I4891 Unspecified atrial fibrillation: Secondary | ICD-10-CM

## 2021-08-30 NOTE — Telephone Encounter (Signed)
I spoke with the patient and informed her of normal EEG. She has an appointment with Cardiology tomorrow. She is seen by them for A-fib. Pt verbalized understanding of the results and expressed appreciation for the call.

## 2021-08-30 NOTE — Telephone Encounter (Signed)
Please call patient, MRI of the brain showed mild small vessel disease, evidence of single chronic microhemorrhage in the left medial occipital lobe, which was not present at previous MRI September 202l.  There is no evidence of acute abnormality on MRI of brain   I advised her to get MRI of the brain CD  prior to see physician at [facility]  1.   Brain volume appears normal. 2.   Scattered punctate T2/FLAIR hyperintense foci in the subcortical and deep white matter consistent with mild chronic microvascular ischemic change.   The extent is a little more than expected for age.  This is slightly progressed compared to the 12/10/2019 MRI  Some of the foci could also be due to migraine headaches. . 3.   Single chronic microhemorrhage in the medial left occipital lobe that had not been noted on the 12/10/2019 MRI.

## 2021-08-31 ENCOUNTER — Encounter: Payer: Self-pay | Admitting: Cardiology

## 2021-08-31 ENCOUNTER — Ambulatory Visit: Payer: Medicare HMO | Admitting: Cardiology

## 2021-08-31 VITALS — BP 119/65 | HR 80 | Temp 98.0°F | Resp 16 | Ht 63.0 in | Wt 197.2 lb

## 2021-08-31 DIAGNOSIS — Z7901 Long term (current) use of anticoagulants: Secondary | ICD-10-CM

## 2021-08-31 DIAGNOSIS — Z86711 Personal history of pulmonary embolism: Secondary | ICD-10-CM

## 2021-08-31 DIAGNOSIS — Z86718 Personal history of other venous thrombosis and embolism: Secondary | ICD-10-CM | POA: Diagnosis not present

## 2021-08-31 DIAGNOSIS — I480 Paroxysmal atrial fibrillation: Secondary | ICD-10-CM

## 2021-08-31 MED ORDER — DILTIAZEM HCL ER COATED BEADS 360 MG PO CP24: 360.0000 mg | ORAL_CAPSULE | Freq: Every morning | ORAL | 0 refills | Status: DC
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
## 2021-08-31 NOTE — Telephone Encounter (Signed)
Message left by Terrence Dupont concerning her MRI results (on 08/23/21).  CONCLUSION: This is a  normal awake EEG.  There is no electrodiagnostic evidence of epileptiform discharge.  Left message today w/ her EEG results. Provided our number to call back with any questions.

## 2021-09-01 NOTE — Telephone Encounter (Signed)
Patient was seen yesterday.

## 2021-09-13 ENCOUNTER — Telehealth: Payer: Self-pay

## 2021-09-15 NOTE — Telephone Encounter (Signed)
Going up on the Cardizem dose should not cause leg pain or shortness of breath.   I would advise her to go to PCP to evaluation or come to our office for EKG check. If she cannot make it to our office urgent care / ER are also options.   Dr. Terri Skains

## 2021-09-18 ENCOUNTER — Encounter: Payer: Self-pay | Admitting: Neurology

## 2021-09-20 NOTE — Telephone Encounter (Signed)
Called and spoke to patient Friday 09/15/21 patient stated she was aware medication cardizem would not cause those symptoms what she was trying to say is that even though medication has been increased those symptoms have not resolved. Patient was advised if she wanted to come in for a ekg but at that time patient said no she would speak to her husband because currently she is not driving

## 2021-09-21 ENCOUNTER — Telehealth: Payer: Self-pay | Admitting: Cardiology

## 2021-09-21 NOTE — Telephone Encounter (Signed)
Spoke to patient earlier today

## 2021-10-02 ENCOUNTER — Ambulatory Visit: Payer: Medicare HMO | Admitting: Cardiology

## 2021-10-03 ENCOUNTER — Ambulatory Visit: Payer: Medicare HMO | Admitting: Cardiology

## 2021-10-06 ENCOUNTER — Ambulatory Visit: Payer: Medicare HMO | Admitting: Cardiology

## 2021-10-06 ENCOUNTER — Ambulatory Visit: Payer: Medicare HMO | Admitting: Adult Health

## 2021-10-06 ENCOUNTER — Encounter: Payer: Self-pay | Admitting: Cardiology

## 2021-10-06 VITALS — BP 134/81 | HR 78 | Temp 98.2°F | Resp 17 | Ht 63.0 in | Wt 196.8 lb

## 2021-10-06 DIAGNOSIS — I480 Paroxysmal atrial fibrillation: Secondary | ICD-10-CM | POA: Diagnosis not present

## 2021-10-06 DIAGNOSIS — Z86711 Personal history of pulmonary embolism: Secondary | ICD-10-CM

## 2021-10-06 DIAGNOSIS — I609 Nontraumatic subarachnoid hemorrhage, unspecified: Secondary | ICD-10-CM | POA: Diagnosis not present

## 2021-10-06 DIAGNOSIS — Z7901 Long term (current) use of anticoagulants: Secondary | ICD-10-CM

## 2021-10-06 DIAGNOSIS — Z86718 Personal history of other venous thrombosis and embolism: Secondary | ICD-10-CM
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
## 2021-10-09 ENCOUNTER — Ambulatory Visit: Payer: Medicare HMO | Admitting: Adult Health

## 2021-10-09 ENCOUNTER — Other Ambulatory Visit: Payer: Self-pay | Admitting: Internal Medicine

## 2021-10-09 DIAGNOSIS — Z1231 Encounter for screening mammogram for malignant neoplasm of breast: Secondary | ICD-10-CM

## 2021-10-18 ENCOUNTER — Other Ambulatory Visit: Payer: Self-pay

## 2021-10-18 DIAGNOSIS — I480 Paroxysmal atrial fibrillation: Secondary | ICD-10-CM

## 2021-10-18 MED ORDER — DILTIAZEM HCL ER COATED BEADS 360 MG PO CP24: 360.0000 mg | ORAL_CAPSULE | Freq: Every morning | ORAL | 0 refills | Status: DC

## 2021-10-19 ENCOUNTER — Ambulatory Visit (INDEPENDENT_AMBULATORY_CARE_PROVIDER_SITE_OTHER): Payer: Medicare HMO | Admitting: Adult Health

## 2021-10-19 ENCOUNTER — Encounter: Payer: Self-pay | Admitting: Adult Health

## 2021-10-19 VITALS — BP 140/66 | HR 57 | Ht 63.5 in | Wt 195.0 lb

## 2021-10-19 DIAGNOSIS — F310 Bipolar disorder, current episode hypomanic: Secondary | ICD-10-CM | POA: Diagnosis not present
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
## 2021-10-25 ENCOUNTER — Ambulatory Visit: Payer: Medicare HMO | Admitting: Physical Medicine & Rehabilitation

## 2021-10-26 ENCOUNTER — Telehealth: Payer: Self-pay | Admitting: Adult Health

## 2021-10-26 NOTE — Telephone Encounter (Signed)
Pt informed

## 2021-10-26 NOTE — Telephone Encounter (Signed)
Emily Rice wants to know if she should wait to make an appt with Emily Rice after she needs the Emily Rice doctor that she was referred to? She states that her appt with Emily Rice is after 10/10 and wants to make sure if this is not too long to wait to see Emily Rice. Also, is asking if her PCP can prescribe her meds if needed until she sees [place]. Her phone number is [phone].

## 2021-10-26 NOTE — Telephone Encounter (Signed)
She can follow up after the [facility] appt. PCP can prescribe medications.

## 2021-10-26 NOTE — Telephone Encounter (Signed)
Please advise 

## 2021-10-29 ENCOUNTER — Other Ambulatory Visit: Payer: Self-pay | Admitting: Cardiology

## 2021-10-29 DIAGNOSIS — I480 Paroxysmal atrial fibrillation: Secondary | ICD-10-CM

## 2021-10-29 NOTE — Progress Notes (Signed)
Documentation:  Patient called the office requesting referral to EP for possible atrial fibrillation ablation.  Referral placed.  No charge.  [name], [place], [facility]  Pager: [phone] Office: [phone]

## 2021-11-25 ENCOUNTER — Other Ambulatory Visit: Payer: Self-pay

## 2021-11-25 ENCOUNTER — Emergency Department (HOSPITAL_BASED_OUTPATIENT_CLINIC_OR_DEPARTMENT_OTHER): Payer: Medicare HMO

## 2021-11-25 ENCOUNTER — Emergency Department (HOSPITAL_COMMUNITY): Payer: Medicare HMO

## 2021-11-25 ENCOUNTER — Emergency Department (HOSPITAL_COMMUNITY)
Admission: EM | Admit: 2021-11-25 | Discharge: 2021-11-25 | Disposition: A | Discharge status: Home / Self Care | Payer: Medicare HMO | Attending: Emergency Medicine | Admitting: Emergency Medicine

## 2021-11-25 ENCOUNTER — Encounter (HOSPITAL_COMMUNITY): Payer: Self-pay

## 2021-11-25 DIAGNOSIS — R001 Bradycardia, unspecified: Secondary | ICD-10-CM | POA: Diagnosis not present

## 2021-11-25 DIAGNOSIS — Z7901 Long term (current) use of anticoagulants: Secondary | ICD-10-CM | POA: Insufficient documentation

## 2021-11-25 DIAGNOSIS — Z79899 Other long term (current) drug therapy: Secondary | ICD-10-CM | POA: Insufficient documentation

## 2021-11-25 DIAGNOSIS — R52 Pain, unspecified: Secondary | ICD-10-CM

## 2021-11-25 DIAGNOSIS — I82431 Acute embolism and thrombosis of right popliteal vein: Secondary | ICD-10-CM | POA: Diagnosis not present

## 2021-11-25 DIAGNOSIS — M79604 Pain in right leg: Secondary | ICD-10-CM | POA: Diagnosis present

## 2021-11-25 DIAGNOSIS — R748 Abnormal levels of other serum enzymes: Secondary | ICD-10-CM | POA: Insufficient documentation

## 2021-11-25 DIAGNOSIS — R079 Chest pain, unspecified: Secondary | ICD-10-CM | POA: Diagnosis not present

## 2021-11-25 DIAGNOSIS — M7989 Other specified soft tissue disorders: Secondary | ICD-10-CM | POA: Diagnosis not present

## 2021-11-25 DIAGNOSIS — I82401 Acute embolism and thrombosis of unspecified deep veins of right lower extremity: Secondary | ICD-10-CM | POA: Diagnosis not present

## 2021-11-25 DIAGNOSIS — R0602 Shortness of breath: Secondary | ICD-10-CM | POA: Diagnosis not present

## 2021-11-25 LAB — CBC WITH DIFFERENTIAL/PLATELET
Abs Immature Granulocytes: 0.02 10*3/uL (ref 0.00–0.07)
Basophils Absolute: 0 10*3/uL (ref 0.0–0.1)
Basophils Relative: 0 %
Eosinophils Absolute: 0.2 10*3/uL (ref 0.0–0.5)
Eosinophils Relative: 3 %
HCT: 40.5 % (ref 36.0–46.0)
Hemoglobin: 13.9 g/dL (ref 12.0–15.0)
Immature Granulocytes: 0 %
Lymphocytes Relative: 39 %
Lymphs Abs: 2.9 10*3/uL (ref 0.7–4.0)
MCH: 27.9 pg (ref 26.0–34.0)
MCHC: 34.3 g/dL (ref 30.0–36.0)
MCV: 81.2 fL (ref 80.0–100.0)
Monocytes Absolute: 0.4 10*3/uL (ref 0.1–1.0)
Monocytes Relative: 5 %
Neutro Abs: 4 10*3/uL (ref 1.7–7.7)
Neutrophils Relative %: 53 %
Platelets: 179 10*3/uL (ref 150–400)
RBC: 4.99 MIL/uL (ref 3.87–5.11)
RDW: 16 % — ABNORMAL HIGH (ref 11.5–15.5)
WBC: 7.5 10*3/uL (ref 4.0–10.5)
nRBC: 0 % (ref 0.0–0.2)

## 2021-11-25 LAB — BASIC METABOLIC PANEL
Anion gap: 11 (ref 5–15)
BUN: 14 mg/dL (ref 6–20)
CO2: 25 mmol/L (ref 22–32)
Calcium: 10 mg/dL (ref 8.9–10.3)
Chloride: 104 mmol/L (ref 98–111)
Creatinine, Ser: 0.98 mg/dL (ref 0.44–1.00)
GFR, Estimated: >60 mL/min (ref >60)
Glucose, Bld: 86 mg/dL (ref 70–99)
Potassium: 4.9 mmol/L (ref 3.5–5.1)
Sodium: 140 mmol/L (ref 135–145)

## 2021-11-25 LAB — CK: Total CK: 389 U/L — ABNORMAL HIGH (ref 38–234)

## 2021-11-25 LAB — ANTITHROMBIN III: AntiThromb III Func: 117 % (ref 75–120)

## 2021-11-25 MED ORDER — KETOROLAC TROMETHAMINE 15 MG/ML IJ SOLN
15.0000 mg | Freq: Once | INTRAMUSCULAR | Status: AC
  Administered 2021-11-25: 15 mg via INTRAVENOUS
  Filled 2021-11-25: qty 1

## 2021-11-25 MED ORDER — ENOXAPARIN SODIUM 40 MG/0.4ML IJ SOSY: 75.0000 mg | PREFILLED_SYRINGE | Freq: Two times a day (BID) | INTRAMUSCULAR | 0 refills | Status: DC

## 2021-11-25 MED ORDER — OXYCODONE-ACETAMINOPHEN 5-325 MG PO TABS: 1.0000 | ORAL_TABLET | Freq: Once | ORAL | Status: DC

## 2021-11-25 MED ORDER — OXYCODONE-ACETAMINOPHEN 5-325 MG PO TABS
1.0000 | ORAL_TABLET | Freq: Once | ORAL | Status: AC
  Administered 2021-11-25: 1 via ORAL
  Filled 2021-11-25: qty 1

## 2021-11-25 MED ORDER — OXYCODONE-ACETAMINOPHEN 5-325 MG PO TABS: 1.0000 | ORAL_TABLET | Freq: Four times a day (QID) | ORAL | 0 refills | Status: AC | PRN

## 2021-11-25 MED ORDER — MORPHINE SULFATE (PF) 2 MG/ML IV SOLN
2.0000 mg | Freq: Once | INTRAVENOUS | Status: AC
  Administered 2021-11-25: 2 mg via INTRAVENOUS
  Filled 2021-11-25: qty 1

## 2021-11-25 MED ORDER — ENOXAPARIN SODIUM 80 MG/0.8ML IJ SOSY
75.0000 mg | PREFILLED_SYRINGE | Freq: Two times a day (BID) | INTRAMUSCULAR | Status: DC
  Administered 2021-11-25: 75 mg via SUBCUTANEOUS
  Filled 2021-11-25 (×2): qty 0.75

## 2021-11-25 NOTE — ED Triage Notes (Signed)
Patient complains of 1 week of right calf pain and pain increased in posterior popliteal area. Denies trauma, takes eliquis daily due to DVT/PE. No SOB

## 2021-11-25 NOTE — Discharge Instructions (Signed)
Your testing was positive for DVT in your right leg despite you taking Eliquis.  Hematology recommend switching to Lovenox and stopping Eliquis.  Follow-up with them in the clinic for an appointment next week.  Return to the ED with difficulty breathing, chest pain, other concerns.

## 2021-11-25 NOTE — Progress Notes (Signed)
RLE venous duplex has been completed.  Preliminary results given to Dr. Sherry Ruffing.   Results can be found under chart review under CV PROC. 11/25/2021 1:09 PM Mykalah Saari RVT, RDMS

## 2021-11-25 NOTE — ED Provider Notes (Signed)
[facility] Provider Note   CSN: [id number] Arrival date & time: 11/25/21  1137     History {Add pertinent medical, surgical, social history, OB history to HPI:1} No chief complaint on file.   [facility] is a 61 y.o. female.  Patient with history of atrial fibrillation on Eliquis, previous DVT and pulmonary embolism, bipolar disorder, traumatic subarachnoid hemorrhage in 2021 presenting with right leg pain for the past week.  Denies any fall or injury.  Describes pain behind her right knee extending up to her thigh as well as her calf.  Denies any back pain.  No weakness, numbness or tingling.  She is concerned she could have another blood clot.  She has been using Tylenol at home without relief.  States compliance with Eliquis with no missed doses.  Has been on this medication for about 3 years and was on Coumadin prior to this for 15 years.  Does not think she was diagnosed with any kind of hypercoagulable state. Has had some intermittent fluttering in her chest which she believes is her atrial fibrillation.  Recent titration of her medications by her cardiologist Dr. Terri [facility].  Denies any chest pain or shortness of breath currently. No weakness in leg. No fever. No back pain.   The history is provided by the patient.       Home Medications Prior to Admission medications   Medication Sig Start Date End Date Taking? Authorizing Provider  acetaminophen (TYLENOL) 325 MG tablet Take 2 tablets (650 mg total) by mouth every 6 (six) hours as needed for mild pain or moderate pain. 04/04/20   Angiulli, Lavon Paganini, PA-C  ALPRAZolam Duanne Moron) 1 MG tablet Take a 1/2 tablet(0.5 mg total) in the am, take a 1/2 tablet(0.5 mg total) in the pm 05/25/20   Meredith Staggers, MD  apixaban (ELIQUIS) 5 MG TABS tablet Take 1 tablet (5 mg total) by mouth 2 (two) times daily. 07/05/20   Meredith Staggers, MD  carbidopa-levodopa (SINEMET IR) 25-100 MG tablet Take one tablet in  the am and one tablet in the pm Patient taking differently: Take 1 tablet by mouth 3 (three) times daily. Take one tablet in the am, one tablet in the afternoon, and one tablet in the pm 05/25/20   Meredith Staggers, MD  Cholecalciferol 75 MCG (3000 UT) TABS Take 3,000 Units by mouth daily. 04/04/20   Angiulli, Lavon Paganini, PA-C  diltiazem (CARDIZEM CD) 360 MG 24 hr capsule Take 1 capsule (360 mg total) by mouth every morning. 10/18/21 01/16/22  Tolia, Sunit, DO  escitalopram (LEXAPRO) 10 MG tablet Take 1 tablet (10 mg total) by mouth at bedtime. 05/02/20   Meredith Staggers, MD  fexofenadine (ALLEGRA) 180 MG tablet Take 180 mg by mouth as needed for allergies or rhinitis.    [provider]  fluticasone (FLONASE) 50 MCG/ACT nasal spray Place 1 spray into both nostrils daily as needed for allergies.     [provider]  lamoTRIgine (LAMICTAL) 200 MG tablet Take 1 tablet (200 mg total) by mouth 2 (two) times daily. 05/25/20   Meredith Staggers, MD  Multiple Vitamin (MULTIVITAMIN WITH MINERALS) TABS tablet Take 1 tablet by mouth daily. 04/05/20   Angiulli, Lavon Paganini, PA-C  polyethylene glycol (MIRALAX / GLYCOLAX) 17 g packet Take 17 g by mouth 2 (two) times daily. Patient taking differently: Take 17 g by mouth as needed. 04/04/20   Angiulli, Lavon Paganini, PA-C  QUEtiapine (SEROQUEL) 100 MG  tablet Take 1 tablet (100 mg total) by mouth at bedtime. 04/04/20   Angiulli, Lavon Paganini, PA-C      Allergies    Compazine [prochlorperazine], Hydromorphone, Hydromorphone, Iodinated contrast media, Prochlorperazine, and Prochlorperazine edisylate    Review of Systems   Review of Systems  Musculoskeletal:  Positive for myalgias.    Physical Exam Updated Vital Signs BP (!) 130/107   Pulse 65   Temp 98.2 F (36.8 C) (Oral)   Resp 16   LMP  (LMP Unknown)   SpO2 96%  Physical Exam Vitals and nursing note reviewed.  Constitutional:      General: She is not in acute distress.    Appearance: She is  well-developed.  HENT:     Head: Normocephalic and atraumatic.     Mouth/Throat:     Pharynx: No oropharyngeal exudate.  Eyes:     Conjunctiva/sclera: Conjunctivae normal.     Pupils: Pupils are equal, round, and reactive to light.  Neck:     Comments: No meningismus. Cardiovascular:     Rate and Rhythm: Normal rate and regular rhythm.     Heart sounds: Normal heart sounds. No murmur heard. Pulmonary:     Effort: Pulmonary effort is normal. No respiratory distress.     Breath sounds: Normal breath sounds.  Abdominal:     Palpations: Abdomen is soft.     Tenderness: There is no abdominal tenderness. There is no guarding or rebound.  Musculoskeletal:        General: Swelling and tenderness present. Normal range of motion.     Cervical back: Normal range of motion and neck supple.     Comments: Right calf tenderness and posterior thigh tenderness without asymmetry or edema.  No erythema.  Intact DP and PT pulses. Full range of motion of hip and knee bilaterally. Ankle flexion and extension intact bilaterally Compartments soft.   Skin:    General: Skin is warm.  Neurological:     Mental Status: She is alert and oriented to person, place, and time.     Cranial Nerves: No cranial nerve deficit.     Motor: No abnormal muscle tone.     Coordination: Coordination normal.     Comments:  5/5 strength throughout. CN 2-12 intact.Equal grip strength.   Psychiatric:        Behavior: Behavior normal.     ED Results / Procedures / Treatments   Labs (all labs ordered are listed, but only abnormal results are displayed) Labs Reviewed  CBC WITH DIFFERENTIAL/PLATELET - Abnormal; Notable for the following components:      Result Value   RDW 16.0 (*)    All other components within normal limits  CK - Abnormal; Notable for the following components:   Total CK 389 (*)    All other components within normal limits  BASIC METABOLIC PANEL  ANTITHROMBIN III  PROTEIN C ACTIVITY  PROTEIN C,  TOTAL  PROTEIN S ACTIVITY  PROTEIN S, TOTAL  LUPUS ANTICOAGULANT PANEL  BETA-2-GLYCOPROTEIN I ABS, IGG/M/A  HOMOCYSTEINE  FACTOR 5 LEIDEN  PROTHROMBIN GENE MUTATION  CARDIOLIPIN ANTIBODIES, IGG, IGM, IGA    EKG EKG Interpretation  Date/Time:  Saturday November 25 2021 17:17:09 EDT Ventricular Rate:  51 PR Interval:  144 QRS Duration: 82 QT Interval:  458 QTC Calculation: 422 R Axis:   74 Text Interpretation: Sinus bradycardia Otherwise normal ECG When compared with ECG of 05-Jul-2021 19:39, PREVIOUS ECG IS PRESENT No significant change was found Confirmed by Ezequiel Essex [phone])  on 11/25/2021 5:27:41 PM  Radiology VAS [place] LOWER EXTREMITY VENOUS (DVT) (ONLY [facility] & [facility])  Result Date: 11/25/2021  Lower Venous DVT Study Patient Name:  KASIA TREGO  Date of Exam:   11/25/2021 Medical Rec #: [id number]        Accession #:    [id number] Date of Birth: June 02, 1960       Patient Gender: F Patient Age:   60 years Exam Location:  [facility] Procedure:      VAS [place] LOWER EXTREMITY VENOUS (DVT) Referring Phys: Marda Stalker --------------------------------------------------------------------------------  Indications: Pain, and Swelling.  Risk Factors: Per patient - HX of DVT/PE. Anticoagulation: Eliquis. Comparison Study: Previous exam on 03/11/20 was negative for DVT. Performing Technologist: Rogelia Rohrer RVT, RDMS  Examination Guidelines: A complete evaluation includes B-mode imaging, spectral Doppler, color Doppler, and power Doppler as needed of all accessible portions of each vessel. Bilateral testing is considered an integral part of a complete examination. Limited examinations for reoccurring indications may be performed as noted. The reflux portion of the exam is performed with the patient in reverse Trendelenburg.  +---------+---------------+---------+-----------+----------+--------------+ RIGHT    CompressibilityPhasicitySpontaneityPropertiesThrombus Aging  +---------+---------------+---------+-----------+----------+--------------+ CFV      Full           Yes      Yes                                 +---------+---------------+---------+-----------+----------+--------------+ SFJ      Full                                                        +---------+---------------+---------+-----------+----------+--------------+ FV Prox  Full           Yes      Yes                                 +---------+---------------+---------+-----------+----------+--------------+ FV Mid   Full           Yes      Yes                                 +---------+---------------+---------+-----------+----------+--------------+ FV DistalFull           Yes      Yes                                 +---------+---------------+---------+-----------+----------+--------------+ PFV      Full                                                        +---------+---------------+---------+-----------+----------+--------------+ POP      Partial        Yes      Yes                  Acute          +---------+---------------+---------+-----------+----------+--------------+ PTV      Full                                                        +---------+---------------+---------+-----------+----------+--------------+  PERO     Full                                                        +---------+---------------+---------+-----------+----------+--------------+     Summary: RIGHT: - Findings consistent with acute deep vein thrombosis involving the right popliteal vein. - No cystic structure found in the popliteal fossa.  LEFT: - No evidence of common femoral vein obstruction.  *See table(s) above for measurements and observations.    Preliminary     Procedures Procedures  {Document cardiac monitor, telemetry assessment procedure when appropriate:1}  Medications Ordered in ED Medications  oxyCODONE-acetaminophen (PERCOCET/ROXICET) 5-325 MG per tablet  1 tablet (has no administration in time range)    ED Course/ Medical Decision Making/ A&P                           Medical Decision Making Amount and/or Complexity of Data Reviewed Labs: ordered. Decision-making details documented in ED Course. Radiology: ordered and independent interpretation performed. Decision-making details documented in ED Course. ECG/medicine tests: ordered and independent interpretation performed. Decision-making details documented in ED Course.  Risk Prescription drug management.   1 week of atraumatic right leg pain with history of DVT.  Denies any missed Eliquis doses.  No hypoxia or increased work of breathing.  Ultrasound shows popliteal DVT on the right that is acute.  Concern for Eliquis failure.  Will discuss with hematology   Discussed with Dr. Chryl Heck hematology.  She agrees there is clinical concern for Eliquis failure.  Recommends transition to Lovenox 1 mg/kg twice daily for at least 30 days and then will see in clinic.  Hypercoagulable panel was sent.  Pharmacy consult to transition to Lovenox.  Creatinine is normal.  Electrolytes are reassuring.  CK minimally elevated.  No hypoxia or increased work of breathing.  No pneumonia on chest x-ray or pleural effusion.  Results reviewed and interpreted by me.  Patient states intermittent palpitations and shortness of breath.  She is in sinus rhythm today.  She has no hypoxia or increased work of breathing.  Chest x-ray is negative.  Low suspicion for pulmonary embolism.  She has no tachycardia no hypoxia.  Discussed with the patient that even if she did have a PE treatment would not likely change as she is already on Lovenox.  Low suspicion for saddle pulmonary embolism given her reassuring vital signs.  She has an unknown allergy to contrast and has required premedication in the past.  She is not interested in having a CT scan today and does not want to wait for premedication.  Low suspicion for massive PE.   Treatment course likely to be unchanged if she had a small or moderate pulmonary embolism we discussed that she is on proper treatment for DVT and pulmonary embolism but needs close follow-up with hematologist next week because she has failed Eliquis.  {Document critical care time when appropriate:1} {Document review of labs and clinical decision tools ie heart score, Chads2Vasc2 etc:1}  {Document your independent review of radiology images, and any outside records:1} {Document your discussion with family members, caretakers, and with consultants:1} {Document social determinants of health affecting pt's care:1} {Document your decision making why or why not admission, treatments were needed:1} Final Clinical Impression(s) / ED Diagnoses Final diagnoses:  None  Rx / DC Orders ED Discharge Orders     None

## 2021-11-25 NOTE — Progress Notes (Signed)
ANTICOAGULATION CONSULT NOTE - Initial Consult  Pharmacy Consult for lovenox Indication: DVT  Allergies  Allergen Reactions   Compazine [Prochlorperazine]    Hydromorphone Hives and Other (See Comments)    Stroke like symptoms; treated with Narcan. Tolerated Percocet in the past.    Hydromorphone    Iodinated Contrast Media    Prochlorperazine Swelling and Other (See Comments)    Compazine - tongue swelling.    Prochlorperazine Edisylate Swelling    Patient Measurements:    Vital Signs: Temp: 98 F (36.7 C) (09/16 1532) Temp Source: Oral (09/16 1532) BP: 137/99 (09/16 1532) Pulse Rate: 72 (09/16 1532)  Labs: Recent Labs    11/25/21 1459  HGB 13.9  HCT 40.5  PLT 179    CrCl cannot be calculated (Patient's most recent lab result is older than the maximum 21 days allowed.).   Medical History: Past Medical History:  Diagnosis Date   A-fib ([place])    Anxiety    Bipolar disorder ([place])    Depression    DVT (deep venous thrombosis) ([place]) 2001 and 2002   DVT (deep venous thrombosis) ([facility])    x 2   Migraines    Pulmonary embolism ([place]) 2001   Tremor     Assessment: 38 YOF presenting with calf pain, DVT on doppler, hx DVT/PE and afib compliant on Eliquis PTA, CBC wnl  Goal of Therapy:  Anti-Xa level 0.6-1 units/ml 4hrs after LMWH dose given Monitor platelets by anticoagulation protocol: Yes   Plan:  Lovenox '1mg'$ /kg ('75mg'$ ) SQ every 12 hours Monitor renal function, s/s bleeding  Bertis Ruddy, PharmD Clinical Pharmacist ED Pharmacist Phone # [phone] 11/25/2021 4:30 PM

## 2021-11-26 ENCOUNTER — Telehealth: Payer: Self-pay

## 2021-11-26 ENCOUNTER — Other Ambulatory Visit: Payer: Self-pay | Admitting: Cardiology

## 2021-11-26 DIAGNOSIS — I480 Paroxysmal atrial fibrillation: Secondary | ICD-10-CM

## 2021-11-26 LAB — LUPUS ANTICOAGULANT PANEL
DRVVT: 56.7 s — ABNORMAL HIGH (ref 0.0–47.0)
PTT Lupus Anticoagulant: 33.1 s (ref 0.0–43.5)

## 2021-11-26 LAB — DRVVT CONFIRM: dRVVT Confirm: 1 ratio (ref 0.8–1.2)

## 2021-11-26 LAB — PROTEIN S ACTIVITY: Protein S Activity: 96 % (ref 63–140)

## 2021-11-26 LAB — PROTEIN C ACTIVITY: Protein C Activity: 158 % (ref 73–180)

## 2021-11-26 LAB — PROTEIN S, TOTAL: Protein S Ag, Total: 138 % (ref 60–150)

## 2021-11-26 LAB — DRVVT MIX: dRVVT Mix: 47.6 s — ABNORMAL HIGH (ref 0.0–40.4)

## 2021-11-26 NOTE — Telephone Encounter (Signed)
Pharmacy called and had questions about lovenox dosing. Changed to '80mg'$  syringe from '40mg'$ , due to the patient would have to give herself 2 shots at one time. This way it is one- 1 mg/kg twice daily for 30 days.

## 2021-11-27 LAB — HOMOCYSTEINE: Homocysteine: 15.3 umol/L — ABNORMAL HIGH (ref 0.0–14.5)

## 2021-11-27 LAB — PROTEIN C, TOTAL: Protein C, Total: 127 % (ref 60–150)

## 2021-11-28 LAB — CARDIOLIPIN ANTIBODIES, IGG, IGM, IGA
Anticardiolipin IgA: <9 APL U/mL (ref 0–11)
Anticardiolipin IgG: 17 GPL U/mL — ABNORMAL HIGH (ref 0–14)
Anticardiolipin IgM: 9 MPL U/mL (ref 0–12)

## 2021-11-28 LAB — BETA-2-GLYCOPROTEIN I ABS, IGG/M/A
Beta-2 Glyco I IgG: <9 GPI IgG units (ref 0–20)
Beta-2-Glycoprotein I IgA: <9 GPI IgA units (ref 0–25)
Beta-2-Glycoprotein I IgM: <9 GPI IgM units (ref 0–32)

## 2021-11-29 ENCOUNTER — Other Ambulatory Visit: Payer: Medicare HMO

## 2021-11-29 ENCOUNTER — Inpatient Hospital Stay: Payer: Medicare HMO | Admitting: Hematology & Oncology

## 2021-11-29 ENCOUNTER — Telehealth: Payer: Self-pay | Admitting: Hematology and Oncology

## 2021-11-29 NOTE — Telephone Encounter (Signed)
Scheduled appt per 9/16 referral. Pt is aware of appt date and time. Pt is aware to arrive 15 mins prior to appt time and to bring and updated insurance card. Pt is aware of appt location.

## 2021-12-01 DIAGNOSIS — I82431 Acute embolism and thrombosis of right popliteal vein: Secondary | ICD-10-CM | POA: Diagnosis not present

## 2021-12-01 DIAGNOSIS — I480 Paroxysmal atrial fibrillation: Secondary | ICD-10-CM | POA: Diagnosis not present

## 2021-12-01 DIAGNOSIS — R413 Other amnesia: Secondary | ICD-10-CM | POA: Diagnosis not present

## 2021-12-01 DIAGNOSIS — F319 Bipolar disorder, unspecified: Secondary | ICD-10-CM | POA: Diagnosis not present

## 2021-12-01 DIAGNOSIS — F411 Generalized anxiety disorder: Secondary | ICD-10-CM | POA: Diagnosis not present

## 2021-12-04 LAB — FACTOR 5 LEIDEN

## 2021-12-04 LAB — PROTHROMBIN GENE MUTATION

## 2021-12-06 ENCOUNTER — Ambulatory Visit: Payer: Medicare HMO

## 2021-12-11 ENCOUNTER — Inpatient Hospital Stay: Payer: Medicare HMO | Attending: Hematology and Oncology | Admitting: Hematology and Oncology

## 2021-12-11 ENCOUNTER — Encounter: Payer: Self-pay | Admitting: Hematology and Oncology

## 2021-12-11 ENCOUNTER — Other Ambulatory Visit: Payer: Self-pay | Admitting: *Deleted

## 2021-12-11 ENCOUNTER — Other Ambulatory Visit: Payer: Self-pay

## 2021-12-11 ENCOUNTER — Inpatient Hospital Stay: Payer: Medicare HMO

## 2021-12-11 VITALS — BP 140/80 | HR 45 | Temp 97.7°F | Wt 191.8 lb

## 2021-12-11 DIAGNOSIS — Z86718 Personal history of other venous thrombosis and embolism: Secondary | ICD-10-CM | POA: Diagnosis not present

## 2021-12-11 DIAGNOSIS — I82401 Acute embolism and thrombosis of unspecified deep veins of right lower extremity: Secondary | ICD-10-CM | POA: Diagnosis not present

## 2021-12-11 DIAGNOSIS — Z7901 Long term (current) use of anticoagulants: Secondary | ICD-10-CM | POA: Diagnosis not present

## 2021-12-11 MED ORDER — ENOXAPARIN SODIUM 40 MG/0.4ML IJ SOSY: 75.0000 mg | PREFILLED_SYRINGE | Freq: Two times a day (BID) | INTRAMUSCULAR | 3 refills | Status: DC
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
## 2021-12-12 ENCOUNTER — Telehealth: Payer: Self-pay | Admitting: *Deleted

## 2021-12-12 ENCOUNTER — Telehealth: Payer: Self-pay | Admitting: Hematology and Oncology

## 2021-12-12 NOTE — Telephone Encounter (Signed)
This RN received VM from the pt stating she needs a refill on the percocet 7.5/'325mg'$ .  She states she has been using 1 tab 3 times a day but yesterday she had more pain and had to take more.  She is now down to 3 tablets.  This RN contacted the pt's pharmacy to follow up on prescribing MD as being Dr Early Osmond with last fill on 9/29 of 14 tabs with sig of 1 po bid.  Per review with Dr Chryl Heck- pt needs to follow up with primary MD per pain issues as pt was seen here for coag work up consult only.  This RN called pt and obtained identified VM- message left per above with need for her to follow up with previous prescribing MD - Dr Doristine Bosworth.

## 2021-12-12 NOTE — Telephone Encounter (Signed)
Contacted patient to scheduled appointment. At the moment, patient said she would call back to scheduled 3 month follow up.

## 2021-12-13 ENCOUNTER — Telehealth: Payer: Self-pay | Admitting: *Deleted

## 2021-12-13 NOTE — Telephone Encounter (Signed)
This RN contacted [facility] per need to inquire about management of coumadin.  Spoke with Gregery Na - discussed issue relating to pt currently on lovenox due to recurrent DVT while on Eliquis - and per consult Dr Chryl Heck is recommending that she is able to convert back to coumadin with need for Dr Terri Skains to follow.  Gregery Na stated their office does have a coumadin clinic and should be able to follow pt. Requested for records to be faxed to [phone]. This RN's name and direct desk number given for return call.  Of note pt had left a VM this AM as well stating concern due to noted multiple areas on her abdomen at injection sites that "are bruised and like have a hematoma under it"  This RN discussed above not unusual with the lovenox due to it being a blood thinner.  This RN did inform her of plan above and that she will be to convert to coumadin soon ( but not presently ) and Dr Brennan Bailey office will be calling her for appropriate follow up.  Pt stated concern for episodes of shortness of breath in relation to her history of A Fib

## 2021-12-14 LAB — PNH PROFILE (-HIGH SENSITIVITY)

## 2021-12-15 ENCOUNTER — Telehealth: Payer: Self-pay | Admitting: *Deleted

## 2021-12-15 NOTE — Telephone Encounter (Signed)
This RN spoke with Dr Jacob Moores office per need for possible managing of coumadin. In summary and for review for Dr Jacob Moores office:  Pt had remote history of DVT and PE and was on coumadin- then approximately a year ago was placed on Eliquis and subsequently developed a DVT while on this medication.  Per in patient status pt was placed on lovenox and referred to Dr Chryl Heck for heme/coag work up.  Per visit and extensive labs pt needs to be on lifelong blood thinners but is appropriate to convert to coumadin. Pt does not need to follow up in this office but ideally can be followed by a coumadin clinic.  This RN called to the pt's known cardiologist Dr Terri Skains to inquire per above- (note his office does have a coumadin clinic).  Post MD review- he declined following coumadin thru his clinic.  This RN informed pt of above- who is eager to convert to coumadin from lovenox due to extensive bruising and tenderness at injection sites.  This RN called to Dr Jacob Moores and have been unable to speak directly with Dr Jacob Moores nurse.  This note will be faxed for communication of above need and inquiry if Dr Doristine Bosworth can assume coumadin follow up.  Of note pt is still presently on lovenox until we can arrange appropriate coumadin follow up.  Direct return call number for this RN is [phone] and fax is [phone].

## 2021-12-18 ENCOUNTER — Ambulatory Visit: Payer: Medicare HMO

## 2021-12-18 LAB — JAK2 (INCLUDING V617F AND EXON 12), MPL,& CALR W/RFL MPN PANEL (NGS)

## 2021-12-19 DIAGNOSIS — Z7901 Long term (current) use of anticoagulants: Secondary | ICD-10-CM | POA: Diagnosis not present

## 2021-12-19 DIAGNOSIS — I82431 Acute embolism and thrombosis of right popliteal vein: Secondary | ICD-10-CM | POA: Diagnosis not present

## 2021-12-19 DIAGNOSIS — M79604 Pain in right leg: Secondary | ICD-10-CM | POA: Diagnosis not present

## 2021-12-21 DIAGNOSIS — F341 Dysthymic disorder: Secondary | ICD-10-CM | POA: Diagnosis not present

## 2021-12-21 DIAGNOSIS — F419 Anxiety disorder, unspecified: Secondary | ICD-10-CM | POA: Diagnosis not present

## 2021-12-22 DIAGNOSIS — Z7901 Long term (current) use of anticoagulants: Secondary | ICD-10-CM | POA: Diagnosis not present

## 2021-12-22 DIAGNOSIS — Z23 Encounter for immunization: Secondary | ICD-10-CM | POA: Diagnosis not present

## 2021-12-25 ENCOUNTER — Telehealth: Payer: Self-pay | Admitting: *Deleted

## 2021-12-25 ENCOUNTER — Other Ambulatory Visit: Payer: Self-pay | Admitting: *Deleted

## 2021-12-25 NOTE — Telephone Encounter (Signed)
See other entry 

## 2021-12-25 NOTE — Telephone Encounter (Signed)
This RN spoke with pt per her call - stating she is having continued pain in her leg with the DVT " it seems to be hurting worse and the percocet is not helping"  " Why is it hurting more?"  "Is there something else wrong "  This RN validated her concerns as well as need for her to discuss these issues further with Dr Royce Macadamia per her active care with managing the coumadin.  Note pt stated concerns over having concerns that her symptoms mean something is worse.  Note she states due to the pain she had to take additional percocet then as ordered.  This RN informed pt of need for her to discuss above with her primary MD- that Dr Rob Hickman role was as a consult for evaluation of why she had clot - which showed she has a diagnosis of hypercoagulably that can now be managed by her primary or a coumadin clinic. Pt does not need further follow up in this office.  Pain issues need to be evaluated by her primary MD and if needed she may also need to proceed to a pain clinic if her pain is chronic.  This RN noted as well of visit with gerontologists at [facility] on Thursday stating noted bipolar dx and need for pt to be established with a psychiatrist.  Per above pt states she was going to [facility] " but then felt like I couldn't trust them"  This RN again validated her concerns but also that her concerns and needs can be best managed by her primary MD.  This RN offered to place a call to Dr Luis Abed to inform them of her concerns voiced to this RN.  This RN placed a call to Dr Royce Macadamia and left detailed message with receptionist per above.

## 2021-12-26 ENCOUNTER — Emergency Department (HOSPITAL_COMMUNITY): Payer: Medicare HMO

## 2021-12-26 ENCOUNTER — Emergency Department (HOSPITAL_COMMUNITY)
Admission: EM | Admit: 2021-12-26 | Discharge: 2021-12-26 | Disposition: A | Discharge status: Home / Self Care | Payer: Medicare HMO | Attending: Emergency Medicine | Admitting: Emergency Medicine

## 2021-12-26 ENCOUNTER — Other Ambulatory Visit: Payer: Self-pay

## 2021-12-26 ENCOUNTER — Encounter (HOSPITAL_COMMUNITY): Payer: Self-pay | Admitting: Emergency Medicine

## 2021-12-26 DIAGNOSIS — M79661 Pain in right lower leg: Secondary | ICD-10-CM | POA: Diagnosis not present

## 2021-12-26 DIAGNOSIS — R112 Nausea with vomiting, unspecified: Secondary | ICD-10-CM | POA: Diagnosis not present

## 2021-12-26 DIAGNOSIS — R079 Chest pain, unspecified: Secondary | ICD-10-CM | POA: Diagnosis not present

## 2021-12-26 DIAGNOSIS — M79604 Pain in right leg: Secondary | ICD-10-CM | POA: Insufficient documentation

## 2021-12-26 DIAGNOSIS — Z79899 Other long term (current) drug therapy: Secondary | ICD-10-CM | POA: Insufficient documentation

## 2021-12-26 DIAGNOSIS — R0602 Shortness of breath: Secondary | ICD-10-CM | POA: Diagnosis not present

## 2021-12-26 DIAGNOSIS — R0789 Other chest pain: Secondary | ICD-10-CM | POA: Diagnosis not present

## 2021-12-26 HISTORY — DX: Deep phlebothrombosis in pregnancy, unspecified trimester: O22.30

## 2021-12-26 LAB — CBC WITH DIFFERENTIAL/PLATELET
Abs Immature Granulocytes: 0.03 10*3/uL (ref 0.00–0.07)
Basophils Absolute: 0 10*3/uL (ref 0.0–0.1)
Basophils Relative: 1 %
Eosinophils Absolute: 0.1 10*3/uL (ref 0.0–0.5)
Eosinophils Relative: 1 %
HCT: 39 % (ref 36.0–46.0)
Hemoglobin: 13.4 g/dL (ref 12.0–15.0)
Immature Granulocytes: 0 %
Lymphocytes Relative: 31 %
Lymphs Abs: 2.2 10*3/uL (ref 0.7–4.0)
MCH: 27.7 pg (ref 26.0–34.0)
MCHC: 34.4 g/dL (ref 30.0–36.0)
MCV: 80.6 fL (ref 80.0–100.0)
Monocytes Absolute: 0.5 10*3/uL (ref 0.1–1.0)
Monocytes Relative: 7 %
Neutro Abs: 4.4 10*3/uL (ref 1.7–7.7)
Neutrophils Relative %: 60 %
Platelets: 264 10*3/uL (ref 150–400)
RBC: 4.84 MIL/uL (ref 3.87–5.11)
RDW: 15.1 % (ref 11.5–15.5)
WBC: 7.2 10*3/uL (ref 4.0–10.5)
nRBC: 0 % (ref 0.0–0.2)

## 2021-12-26 LAB — PROTIME-INR
INR: 1.6 — ABNORMAL HIGH (ref 0.8–1.2)
Prothrombin Time: 18.4 seconds — ABNORMAL HIGH (ref 11.4–15.2)

## 2021-12-26 LAB — COMPREHENSIVE METABOLIC PANEL
ALT: 21 U/L (ref 0–44)
AST: 30 U/L (ref 15–41)
Albumin: 4.3 g/dL (ref 3.5–5.0)
Alkaline Phosphatase: 87 U/L (ref 38–126)
Anion gap: 13 (ref 5–15)
BUN: 8 mg/dL (ref 6–20)
CO2: 24 mmol/L (ref 22–32)
Calcium: 10 mg/dL (ref 8.9–10.3)
Chloride: 104 mmol/L (ref 98–111)
Creatinine, Ser: 1.09 mg/dL — ABNORMAL HIGH (ref 0.44–1.00)
GFR, Estimated: 59 mL/min — ABNORMAL LOW (ref >60)
Glucose, Bld: 90 mg/dL (ref 70–99)
Potassium: 4.2 mmol/L (ref 3.5–5.1)
Sodium: 141 mmol/L (ref 135–145)
Total Bilirubin: 0.2 mg/dL — ABNORMAL LOW (ref 0.3–1.2)
Total Protein: 7.9 g/dL (ref 6.5–8.1)

## 2021-12-26 LAB — TROPONIN I (HIGH SENSITIVITY)
Troponin I (High Sensitivity): 5 ng/L (ref <18)
Troponin I (High Sensitivity): 5 ng/L (ref <18)

## 2021-12-26 MED ORDER — IOHEXOL 350 MG/ML SOLN
80.0000 mL | Freq: Once | INTRAVENOUS | Status: AC | PRN
  Administered 2021-12-26: 80 mL via INTRAVENOUS

## 2021-12-26 MED ORDER — METOCLOPRAMIDE HCL 5 MG/ML IJ SOLN
10.0000 mg | Freq: Once | INTRAMUSCULAR | Status: AC
  Administered 2021-12-26: 10 mg via INTRAVENOUS
  Filled 2021-12-26: qty 2

## 2021-12-26 MED ORDER — METHYLPREDNISOLONE SODIUM SUCC 40 MG IJ SOLR
40.0000 mg | Freq: Once | INTRAMUSCULAR | Status: AC
  Administered 2021-12-26: 40 mg via INTRAVENOUS
  Filled 2021-12-26: qty 1

## 2021-12-26 MED ORDER — FENTANYL CITRATE PF 50 MCG/ML IJ SOSY
25.0000 ug | PREFILLED_SYRINGE | Freq: Once | INTRAMUSCULAR | Status: AC
  Administered 2021-12-26: 25 ug via INTRAVENOUS
  Filled 2021-12-26: qty 1

## 2021-12-26 MED ORDER — KETOROLAC TROMETHAMINE 15 MG/ML IJ SOLN
15.0000 mg | Freq: Once | INTRAMUSCULAR | Status: AC
  Administered 2021-12-26: 15 mg via INTRAVENOUS
  Filled 2021-12-26: qty 1

## 2021-12-26 MED ORDER — DIPHENHYDRAMINE HCL 50 MG/ML IJ SOLN
50.0000 mg | Freq: Once | INTRAMUSCULAR | Status: AC
  Administered 2021-12-26: 50 mg via INTRAVENOUS
  Filled 2021-12-26: qty 1

## 2021-12-26 MED ORDER — METOCLOPRAMIDE HCL 10 MG PO TABS: 10.0000 mg | ORAL_TABLET | Freq: Four times a day (QID) | ORAL | 0 refills | Status: DC

## 2021-12-26 MED ORDER — DIPHENHYDRAMINE HCL 25 MG PO CAPS
50.0000 mg | ORAL_CAPSULE | Freq: Once | ORAL | Status: AC
  Filled 2021-12-26: qty 2

## 2021-12-26 NOTE — ED Notes (Signed)
Patient transported to CT 

## 2021-12-26 NOTE — ED Provider Triage Note (Signed)
Emergency Medicine Provider Triage Evaluation Note  Emily Rice , a 61 y.o. female  was evaluated in triage.  Pt complains of nausea, vomiting and leg pain. Diagnosed with DVT on sept 16th 2023. On lovenox but transition to coumadin started last week. 3rd DVT. H/o PE several years ago. Nausea and vomiting for a few days. Not able to keep anything down. New shortness of breath this morning with chest tightness. H/o afib on Cardizem.    Review of Systems  Positive: See above Negative: See above  Physical Exam  BP 132/80   Pulse (!) 56   Temp 98.6 F (37 C)   Resp 18   Ht '5\' 3"'$  (1.6 m)   Wt 81.6 kg   LMP  (LMP Unknown)   SpO2 96%   BMI 31.89 kg/m  Gen:   Awake, ill appearing Resp:  Tachypnic,  MSK:   Moves extremities without difficulty  Other:    Medical Decision Making  Medically screening exam initiated at 9:19 AM.  Appropriate orders placed.  Emily Rice was informed that the remainder of the evaluation will be completed by another provider, this initial triage assessment does not replace that evaluation, and the importance of remaining in the ED until their evaluation is complete.  Pe study and ACS rule out   Emily Pho, PA-C 12/26/21 1552

## 2021-12-26 NOTE — ED Triage Notes (Signed)
Pt. Stated, I have a DVT in my rt. Leg dex on Sept. 16. I take Lovenox . Im having SOB and chest tightness started this morning. But its been going on for awhile. Im having rt. Leg pain and felt like I was about to pass out

## 2021-12-26 NOTE — ED Provider Notes (Signed)
[facility] EMERGENCY DEPARTMENT Provider Note   CSN: [id number] Arrival date & time: 12/26/21  0626     History  Chief Complaint  Patient presents with   Emesis   Nausea   Leg Pain   Chest Pain    Emily Rice is a 61 y.o. female.  Patient presents with right leg pain, recently diagnosed with a right popliteal DVT last month and was on lovenox, now in the process of transitioning to coumadin. Reports worsening dyspnea and chest pain that started this morning, her right leg pain has worsened as well. Also reports dizziness but denies any vision changes, head trauma or loss of consciousness. Prior PE years ago for which she was taking eliquis up until last month when she was diagnosed with a new DVT and started on lovenox.   The history is provided by the patient.  Emesis Leg Pain Chest Pain Associated symptoms: shortness of breath and vomiting        Home Medications Prior to Admission medications   Medication Sig Start Date End Date Taking? Authorizing Provider  acetaminophen (TYLENOL) 325 MG tablet Take 2 tablets (650 mg total) by mouth every 6 (six) hours as needed for mild pain or moderate pain. 04/04/20  Yes Angiulli, Lavon Paganini, PA-C  ALPRAZolam Duanne Moron) 1 MG tablet Take a 1/2 tablet(0.5 mg total) in the am, take a 1/2 tablet(0.5 mg total) in the pm Patient taking differently: 1 mg 2 (two) times daily as needed for anxiety or sleep. Take a 1/2 tablet(0.5 mg total) in the am, take a 1/2 tablet(0.5 mg total) in the pm 05/25/20  Yes Meredith Staggers, MD  carbidopa-levodopa (SINEMET IR) 25-100 MG tablet Take one tablet in the am and one tablet in the pm Patient taking differently: Take 1 tablet by mouth 3 (three) times daily. Take one tablet in the am, one tablet in the afternoon, and one tablet in the pm 05/25/20  Yes Meredith Staggers, MD  cetirizine (ZYRTEC) 10 MG tablet Take 10 mg by mouth daily as needed for allergies.   Yes [provider]   Cholecalciferol 75 MCG (3000 UT) TABS Take 3,000 Units by mouth daily. 04/04/20  Yes Angiulli, Lavon Paganini, PA-C  diltiazem (CARDIZEM CD) 360 MG 24 hr capsule TAKE 1 CAPSULE (360 MG TOTAL) BY MOUTH EVERY MORNING. 11/27/21 02/25/22 Yes Tolia, Sunit, DO  enoxaparin (LOVENOX) 40 MG/0.4ML injection Inject 40 mg into the skin every 12 (twelve) hours.   Yes [provider]  escitalopram (LEXAPRO) 10 MG tablet Take 1 tablet (10 mg total) by mouth at bedtime. 05/02/20  Yes Meredith Staggers, MD  fexofenadine (ALLEGRA) 180 MG tablet Take 180 mg by mouth as needed for allergies or rhinitis.   Yes [provider]  fluticasone (FLONASE) 50 MCG/ACT nasal spray Place 1 spray into both nostrils daily as needed for allergies.    Yes [provider]  lamoTRIgine (LAMICTAL) 200 MG tablet Take 1 tablet (200 mg total) by mouth 2 (two) times daily. 05/25/20  Yes Meredith Staggers, MD  Multiple Vitamin (MULTIVITAMIN WITH MINERALS) TABS tablet Take 1 tablet by mouth daily. 04/05/20  Yes Angiulli, Lavon Paganini, PA-C  ondansetron (ZOFRAN) 4 MG tablet Take 4 mg by mouth every 6 (six) hours as needed for nausea/vomiting. 12/01/21  Yes [provider]  oxyCODONE-acetaminophen (PERCOCET) 7.5-325 MG tablet Take 1 tablet by mouth every 12 (twelve) hours as needed for pain. 12/20/21  Yes [provider]  polyethylene glycol (  MIRALAX / GLYCOLAX) 17 g packet Take 17 g by mouth 2 (two) times daily. Patient taking differently: Take 17 g by mouth daily as needed for mild constipation. 04/04/20  Yes Angiulli, Lavon Paganini, PA-C  QUEtiapine (SEROQUEL) 100 MG tablet Take 1 tablet (100 mg total) by mouth at bedtime. 04/04/20  Yes Angiulli, Lavon Paganini, PA-C  warfarin (COUMADIN) 5 MG tablet Take 5 mg by mouth daily. 12/15/21  Yes [provider]      Allergies    Compazine [prochlorperazine], Hydromorphone, Hydromorphone, Iodinated contrast media, Prochlorperazine, and Prochlorperazine edisylate    Review  of Systems   Review of Systems  Respiratory:  Positive for shortness of breath.   Cardiovascular:  Positive for chest pain.  Gastrointestinal:  Positive for vomiting.  Musculoskeletal:        Right leg pain  All other systems reviewed and are negative.   Physical Exam Updated Vital Signs BP 116/70   Pulse 71   Temp 98.3 F (36.8 C) (Oral)   Resp 17   Ht '5\' 3"'$  (1.6 m)   Wt 81.6 kg   LMP  (LMP Unknown)   SpO2 100%   BMI 31.89 kg/m  Physical Exam Vitals reviewed.  Constitutional:      General: She is not in acute distress.    Appearance: Normal appearance. She is ill-appearing.  HENT:     Head: Normocephalic and atraumatic.     Nose: Nose normal.  Eyes:     General: No scleral icterus.       Right eye: No discharge.        Left eye: No discharge.     Extraocular Movements: Extraocular movements intact.     Conjunctiva/sclera: Conjunctivae normal.     Pupils: Pupils are equal, round, and reactive to light.  Cardiovascular:     Rate and Rhythm: Normal rate and regular rhythm.     Pulses: Normal pulses.     Heart sounds: Normal heart sounds. No murmur heard.    No friction rub. No gallop.  Pulmonary:     Effort: Pulmonary effort is normal. No respiratory distress.     Breath sounds: Normal breath sounds. No stridor. No wheezing, rhonchi or rales.  Chest:     Chest wall: No tenderness.  Abdominal:     General: Bowel sounds are normal. There is no distension.     Palpations: Abdomen is soft. There is no mass.     Tenderness: There is no abdominal tenderness. There is no rebound.  Musculoskeletal:        General: Tenderness (right calf and popliteal tenderness) present. No swelling. Normal range of motion.     Cervical back: Normal range of motion. No rigidity or tenderness.     Right lower leg: No edema.     Left lower leg: No edema.     Comments: Distal pulses strong and equal bilaterally, right calf and popliteal fossa tenderness   Lymphadenopathy:     Cervical:  No cervical adenopathy.  Skin:    General: Skin is warm and dry.     Capillary Refill: Capillary refill takes less than 2 seconds.     Findings: No bruising, erythema or rash.  Neurological:     General: No focal deficit present.     Mental Status: She is alert and oriented to person, place, and time.     Motor: No weakness.     ED Results / Procedures / Treatments   Labs (all labs ordered are listed,  but only abnormal results are displayed) Labs Reviewed  COMPREHENSIVE METABOLIC PANEL - Abnormal; Notable for the following components:      Result Value   Creatinine, Ser 1.09 (*)    Total Bilirubin 0.2 (*)    GFR, Estimated 59 (*)    All other components within normal limits  PROTIME-INR - Abnormal; Notable for the following components:   Prothrombin Time 18.4 (*)    INR 1.6 (*)    All other components within normal limits  CBC WITH DIFFERENTIAL/PLATELET  TROPONIN I (HIGH SENSITIVITY)  TROPONIN I (HIGH SENSITIVITY)    EKG EKG Interpretation  Date/Time:  Tuesday December 26 2021 08:48:02 EDT Ventricular Rate:  82 PR Interval:  132 QRS Duration: 86 QT Interval:  392 QTC Calculation: 457 R Axis:   65 Text Interpretation: Sinus rhythm with Premature atrial complexes with Abberant conduction Abnormal ECG Confirmed by Carmin Muskrat [phone]) on 12/26/2021 10:29:24 AM  Radiology DG Chest 2 View  Result Date: 12/26/2021 CLINICAL DATA:  Chest pain and shortness of breath EXAM: CHEST - 2 VIEW COMPARISON:  11/25/2021 FINDINGS: Normal heart size and mediastinal contours. No acute infiltrate or edema. No effusion or pneumothorax. No acute osseous findings. Postoperative right humerus. IMPRESSION: No evidence of active disease. Electronically Signed   By: Jorje Guild M.D.   On: 12/26/2021 09:56    Procedures Procedures  None  Medications Ordered in ED Medications  diphenhydrAMINE (BENADRYL) capsule 50 mg (has no administration in time range)    Or  diphenhydrAMINE  (BENADRYL) injection 50 mg (has no administration in time range)  fentaNYL (SUBLIMAZE) injection 25 mcg (has no administration in time range)  fentaNYL (SUBLIMAZE) injection 25 mcg (25 mcg Intravenous Given 12/26/21 1042)  fentaNYL (SUBLIMAZE) injection 25 mcg (25 mcg Intravenous Given 12/26/21 1207)  ketorolac (TORADOL) 15 MG/ML injection 15 mg (15 mg Intravenous Given 12/26/21 1204)  methylPREDNISolone sodium succinate (SOLU-MEDROL) 40 mg/mL injection 40 mg (40 mg Intravenous Given 12/26/21 1205)    ED Course/ Medical Decision Making/ A&P                           Medical Decision Making Amount and/or Complexity of Data Reviewed Radiology: ordered.  Risk Prescription drug management.   Patient with atrial fibrillation, history of PE and recent DVT presents with worsening right leg pain, dyspnea and chest pain. Never became hypoxic but on 0.5L O2 for comfort. Obvious concern for PE given history, atrial fibrillation and Wells score of 7.5 placing patient in high risk group. Blood work unremarkable with troponin within normal limits. Given patient's contrast allergy, she will need to be premedicated. CTA chest pending at time of shift change. Discussed with attending, Dr. Vanita Panda. Anticipate discharge home if CTA is negative.  Final Clinical Impression(s) / ED Diagnoses Final diagnoses:  Right leg pain    Rx / DC Orders ED Discharge Orders     None         Donney Dice, DO 12/26/21 1435    Carmin Muskrat, MD 12/27/21 1254

## 2021-12-26 NOTE — ED Provider Notes (Addendum)
  Physical Exam  BP (!) 145/80   Pulse 69   Temp 98.5 F (36.9 C) (Oral)   Resp 14   Ht '5\' 3"'$  (1.6 m)   Wt 81.6 kg   LMP  (LMP Unknown)   SpO2 99%   BMI 31.89 kg/m   Physical Exam  Procedures  Procedures  ED Course / MDM    Medical Decision Making Amount and/or Complexity of Data Reviewed Radiology: ordered.  Risk Prescription drug management.   Received patient signout.  Chest tightness sharp.  Previous PE.  CT angiography reassuring.  No PE.  Troponin negative.  Doubt cardiac cause.  Appears stable for discharge home.  Patient now states that she has been throwing up for months.  States her primary care doctor cannot figure out what it is.  States she has been unable to keep her medicines down last night and some other days.  Has not vomited since the 10-hour she has been here however.  States she is worried about her Coumadin.  However patient is on Lovenox also.  States she has been taking Zofran has not been helping.  Initially had plan to give Reglan but patient states she has tried this in the past does not work.  We will try some IV Reglan here since she cannot do Compazine and Phenergan.  We will also give oral trial but likely should be able to discharge home.       Davonna Belling, MD 12/26/21 [place]    Davonna Belling, MD 12/26/21 [phone]

## 2021-12-27 ENCOUNTER — Encounter: Payer: Self-pay | Admitting: Adult Health

## 2021-12-27 ENCOUNTER — Ambulatory Visit: Payer: Medicare HMO | Admitting: Adult Health

## 2021-12-27 DIAGNOSIS — F310 Bipolar disorder, current episode hypomanic: Secondary | ICD-10-CM | POA: Diagnosis not present

## 2021-12-27 MED ORDER — ALPRAZOLAM 1 MG PO TABS: 1.0000 mg | ORAL_TABLET | Freq: Two times a day (BID) | ORAL | 2 refills | Status: DC
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
## 2021-12-28 ENCOUNTER — Ambulatory Visit: Payer: Medicare HMO | Admitting: Adult Health

## 2021-12-28 DIAGNOSIS — Z7901 Long term (current) use of anticoagulants: Secondary | ICD-10-CM | POA: Diagnosis not present

## 2021-12-28 DIAGNOSIS — R6889 Other general symptoms and signs: Secondary | ICD-10-CM | POA: Diagnosis not present

## 2021-12-28 DIAGNOSIS — R0789 Other chest pain: Secondary | ICD-10-CM | POA: Diagnosis not present

## 2021-12-28 DIAGNOSIS — I82431 Acute embolism and thrombosis of right popliteal vein: Secondary | ICD-10-CM | POA: Diagnosis not present

## 2021-12-28 DIAGNOSIS — R112 Nausea with vomiting, unspecified: Secondary | ICD-10-CM | POA: Diagnosis not present

## 2022-01-01 DIAGNOSIS — Z7901 Long term (current) use of anticoagulants: Secondary | ICD-10-CM | POA: Diagnosis not present

## 2022-01-02 ENCOUNTER — Other Ambulatory Visit: Payer: Self-pay

## 2022-01-02 DIAGNOSIS — I480 Paroxysmal atrial fibrillation: Secondary | ICD-10-CM

## 2022-01-02 MED ORDER — DILTIAZEM HCL ER COATED BEADS 360 MG PO CP24: 360.0000 mg | ORAL_CAPSULE | Freq: Every morning | ORAL | 0 refills | Status: DC

## 2022-01-04 ENCOUNTER — Other Ambulatory Visit: Payer: Self-pay

## 2022-01-04 ENCOUNTER — Telehealth: Payer: Self-pay | Admitting: Adult Health

## 2022-01-04 NOTE — Telephone Encounter (Signed)
Spoke to pt refill not needed

## 2022-01-04 NOTE — Telephone Encounter (Signed)
Pharmacy called asking for a new script of lamotrigine 200 mg.

## 2022-01-05 ENCOUNTER — Ambulatory Visit
Admission: RE | Admit: 2022-01-05 | Discharge: 2022-01-05 | Disposition: A | Discharge status: Home / Self Care | Payer: Medicare HMO | Source: Ambulatory Visit | Attending: Internal Medicine | Admitting: Internal Medicine

## 2022-01-05 DIAGNOSIS — Z1231 Encounter for screening mammogram for malignant neoplasm of breast: Secondary | ICD-10-CM | POA: Diagnosis not present

## 2022-01-05 DIAGNOSIS — Z7901 Long term (current) use of anticoagulants: Secondary | ICD-10-CM | POA: Diagnosis not present

## 2022-01-08 ENCOUNTER — Other Ambulatory Visit: Payer: Self-pay

## 2022-01-08 MED ORDER — LAMOTRIGINE 200 MG PO TABS: 200.0000 mg | ORAL_TABLET | Freq: Two times a day (BID) | ORAL | 0 refills | Status: DC

## 2022-01-08 MED ORDER — ESCITALOPRAM OXALATE 10 MG PO TABS: 10.0000 mg | ORAL_TABLET | Freq: Every day | ORAL | 0 refills | Status: DC

## 2022-01-08 MED ORDER — QUETIAPINE FUMARATE 100 MG PO TABS: 100.0000 mg | ORAL_TABLET | Freq: Every day | ORAL | 0 refills | Status: DC

## 2022-01-09 ENCOUNTER — Institutional Professional Consult (permissible substitution): Payer: Medicare HMO | Admitting: Cardiology

## 2022-01-16 DIAGNOSIS — Z01419 Encounter for gynecological examination (general) (routine) without abnormal findings: Secondary | ICD-10-CM | POA: Diagnosis not present

## 2022-01-16 DIAGNOSIS — R6889 Other general symptoms and signs: Secondary | ICD-10-CM | POA: Diagnosis not present

## 2022-01-16 DIAGNOSIS — N950 Postmenopausal bleeding: Secondary | ICD-10-CM | POA: Diagnosis not present

## 2022-01-16 DIAGNOSIS — N898 Other specified noninflammatory disorders of vagina: Secondary | ICD-10-CM | POA: Diagnosis not present

## 2022-01-17 ENCOUNTER — Ambulatory Visit: Payer: Medicare HMO | Admitting: Adult Health

## 2022-01-17 ENCOUNTER — Encounter: Payer: Self-pay | Admitting: Adult Health

## 2022-01-17 DIAGNOSIS — F422 Mixed obsessional thoughts and acts: Secondary | ICD-10-CM | POA: Diagnosis not present

## 2022-01-17 DIAGNOSIS — F411 Generalized anxiety disorder: Secondary | ICD-10-CM

## 2022-01-17 DIAGNOSIS — F310 Bipolar disorder, current episode hypomanic: Secondary | ICD-10-CM | POA: Diagnosis not present

## 2022-01-17 DIAGNOSIS — F4310 Post-traumatic stress disorder, unspecified: Secondary | ICD-10-CM | POA: Diagnosis not present

## 2022-01-17 DIAGNOSIS — G4700 Insomnia, unspecified: Secondary | ICD-10-CM

## 2022-01-17 MED ORDER — ALPRAZOLAM 1 MG PO TABS: ORAL_TABLET | ORAL | 2 refills | Status: DC
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
## 2022-01-19 DIAGNOSIS — Z7901 Long term (current) use of anticoagulants: Secondary | ICD-10-CM | POA: Diagnosis not present

## 2022-01-19 DIAGNOSIS — R6889 Other general symptoms and signs: Secondary | ICD-10-CM | POA: Diagnosis not present

## 2022-01-21 ENCOUNTER — Other Ambulatory Visit: Payer: Self-pay | Admitting: Cardiology

## 2022-01-21 DIAGNOSIS — I480 Paroxysmal atrial fibrillation: Secondary | ICD-10-CM

## 2022-01-25 DIAGNOSIS — R6889 Other general symptoms and signs: Secondary | ICD-10-CM | POA: Diagnosis not present

## 2022-01-25 DIAGNOSIS — N950 Postmenopausal bleeding: Secondary | ICD-10-CM | POA: Diagnosis not present

## 2022-02-05 ENCOUNTER — Telehealth: Payer: Self-pay | Admitting: Cardiology

## 2022-02-05 ENCOUNTER — Institutional Professional Consult (permissible substitution): Payer: Medicare HMO | Admitting: Cardiology

## 2022-02-05 ENCOUNTER — Ambulatory Visit: Payer: Medicare HMO | Attending: Cardiology | Admitting: Cardiology

## 2022-02-05 ENCOUNTER — Encounter: Payer: Self-pay | Admitting: Cardiology

## 2022-02-05 VITALS — BP 100/62 | HR 64 | Ht 62.0 in | Wt 187.2 lb

## 2022-02-05 DIAGNOSIS — D699 Hemorrhagic condition, unspecified: Secondary | ICD-10-CM | POA: Diagnosis not present

## 2022-02-05 DIAGNOSIS — I480 Paroxysmal atrial fibrillation: Secondary | ICD-10-CM

## 2022-02-05 DIAGNOSIS — R6889 Other general symptoms and signs: Secondary | ICD-10-CM | POA: Diagnosis not present

## 2022-02-05 NOTE — Progress Notes (Signed)
Electrophysiology Office Note:    Date:  02/05/2022   ID:  Emily Rice, DOB 28-Jan-1961, MRN [id number]  PCP:  [name], MD  [facility] Cardiologist:  None  [facility] Electrophysiologist:  None   Referring MD: [name], DO   Chief Complaint: Atrial fibrillation  History of Present Illness:    Emily Rice is a 61 y.o. female who presents for an evaluation of atrial fibrillation at the request of Dr. Terri Skains. Their medical history includes recurrent DVT/PE, traumatic subarachnoid hemorrhage, bipolar disorder, sickle cell trait and symptomatic atrial fibrillation.  Her atrial fibrillation was diagnosed in 2014 and is highly symptomatic.  It is paroxysmal and has previously been treated with nodal blockers.     Past Medical History:  Diagnosis Date   A-fib ([place])    Anxiety    Bipolar disorder ([place])    Depression    DVT (deep vein thrombosis) in pregnancy    DVT (deep venous thrombosis) ([place]) 2001 and 2002   DVT (deep venous thrombosis) (HCC)    x 2   Migraines    Pulmonary embolism ([place]) 2001   Tremor     Past Surgical History:  Procedure Laterality Date   CHOLECYSTECTOMY  2014   CHOLECYSTECTOMY     HUMERUS IM NAIL Right 03/03/2020   Procedure: INTRAMEDULLARY (IM) NAIL HUMERAL;  Surgeon: Shona Needles, MD;  Location: [facility];  Service: Orthopedics;  Laterality: Right;   OVARIAN CYST REMOVAL     TONSILLECTOMY      Current Medications: Current Meds  Medication Sig   acetaminophen (TYLENOL) 325 MG tablet Take 2 tablets (650 mg total) by mouth every 6 (six) hours as needed for mild pain or moderate pain.   ALPRAZolam (XANAX) 1 MG tablet Take one tablet in the morning, 1/2 tablet at lunchtime, and one tablet at bedtime for anxiety.   carbidopa-levodopa (SINEMET IR) 25-100 MG tablet Take one tablet in the am and one tablet in the pm (Patient taking differently: Take 1 tablet by mouth 3 (three) times daily. Take one tablet in the am, one tablet in the  afternoon, and one tablet in the pm)   cetirizine (ZYRTEC) 10 MG tablet Take 10 mg by mouth daily as needed for allergies.   Cholecalciferol 75 MCG (3000 UT) TABS Take 3,000 Units by mouth daily.   diltiazem (CARDIZEM CD) 360 MG 24 hr capsule TAKE 1 CAPSULE (360 MG TOTAL) BY MOUTH EVERY MORNING.   enoxaparin (LOVENOX) 40 MG/0.4ML injection Inject 40 mg into the skin every 12 (twelve) hours.   escitalopram (LEXAPRO) 10 MG tablet Take 1 tablet (10 mg total) by mouth at bedtime.   fexofenadine (ALLEGRA) 180 MG tablet Take 180 mg by mouth as needed for allergies or rhinitis.   fluticasone (FLONASE) 50 MCG/ACT nasal spray Place 1 spray into both nostrils daily as needed for allergies.    lamoTRIgine (LAMICTAL) 200 MG tablet Take 1 tablet (200 mg total) by mouth 2 (two) times daily.   metoCLOPramide (REGLAN) 10 MG tablet Take 1 tablet (10 mg total) by mouth every 6 (six) hours.   Multiple Vitamin (MULTIVITAMIN WITH MINERALS) TABS tablet Take 1 tablet by mouth daily.   ondansetron (ZOFRAN) 4 MG tablet Take 4 mg by mouth every 6 (six) hours as needed for nausea/vomiting.   oxyCODONE-acetaminophen (PERCOCET) 7.5-325 MG tablet Take 1 tablet by mouth every 12 (twelve) hours as needed for pain.   polyethylene glycol (MIRALAX / GLYCOLAX) 17 g packet Take 17 g  by mouth 2 (two) times daily. (Patient taking differently: Take 17 g by mouth daily as needed for mild constipation.)   QUEtiapine (SEROQUEL) 100 MG tablet Take 1 tablet (100 mg total) by mouth at bedtime.   warfarin (COUMADIN) 5 MG tablet Take 5 mg by mouth daily.     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 Right-handed. No daily use of caffeine.     Social Determinants of Health   Financial Resource Strain: Not on file  Food Insecurity: Not on file  Transportation Needs: Not on file  Physical Activity: Not on file  Stress: Not on file  Social Connections: Not on file     Family History: The patient's family history includes Atrial fibrillation in her father; CAD in her brother; Diabetes in her sister; Hypertension in her father, maternal uncle, and mother; Stroke in her father.  ROS:   Please see the history of present illness.    All other systems reviewed and are negative.  EKGs/Labs/Other Studies Reviewed:    The following studies were reviewed today:  Aug 08, 2021 EKG shows atrial fibrillation with rapid ventricular rate    Recent Labs: 02/27/2021: TSH 1.820 07/05/2021: B Natriuretic Peptide 59.9 12/26/2021: ALT 21; BUN 8; Creatinine, Ser 1.09; Hemoglobin 13.4; Platelets 264; Potassium 4.2; Sodium 141  Recent Lipid Panel    Component Value Date/Time   CHOL 193 02/27/2021 1013   TRIG 100 02/27/2021 1013   HDL 60 02/27/2021 1013   LDLCALC 115 (H) 02/27/2021 1013   LDLDIRECT 111 (H) 02/27/2021 1013    Physical Exam:    VS:  BP 100/62   Pulse 64   Ht '5\' 2"'$  (1.575 m)   Wt 187 lb 3.2 oz (84.9 kg)   LMP  (LMP Unknown)   SpO2 98%   BMI 34.24 kg/m     Wt Readings from Last 3 Encounters:  02/05/22 187 lb 3.2 oz (84.9 kg)  12/26/21 180 lb (81.6 kg)  12/11/21 191 lb 12.8 oz (87 kg)     GEN:  Well  nourished, well developed in no acute distress HEENT: Normal NECK: No JVD; No carotid bruits LYMPHATICS: No lymphadenopathy CARDIAC: RRR, no murmurs, rubs, gallops RESPIRATORY:  Clear to auscultation without rales, wheezing or rhonchi  ABDOMEN: Soft, non-tender, non-distended MUSCULOSKELETAL:  No edema; No deformity  SKIN: Warm and dry NEUROLOGIC:  Alert and oriented x 3.  Some word finding difficulties PSYCHIATRIC:  Normal affect       ASSESSMENT:    1. Paroxysmal atrial fibrillation (HCC)   2. Bleeding disorder ([place])  PLAN:    In order of problems listed above:  #Paroxysmal atrial fibrillation Symptomatic.  On warfarin for stroke prophylaxis given history of DVT/PE.  Discussed treatment options today for their AF including antiarrhythmic drug therapy and ablation. Discussed risks, recovery and likelihood of success. Discussed potential need for repeat ablation procedures and antiarrhythmic drugs after an initial ablation. They wish to proceed with scheduling.  Risk, benefits, and alternatives to EP study and radiofrequency ablation for afib were also discussed in detail today. These risks include but are not limited to stroke, bleeding, vascular damage, tamponade, perforation, damage to the esophagus, lungs, and other structures, pulmonary vein stenosis, worsening renal function, and death. The patient understands these risk and wishes to proceed.  We will therefore proceed with catheter ablation at the next available time.  Carto, ICE, anesthesia are requested for the procedure.  Will also obtain CT PV protocol prior to the procedure to exclude LAA thrombus and further evaluate atrial anatomy.  # Recurrent DVT/PE On warfarin as above. I recommended a referral to genetics to further investigate the root cause of her DVT/PE, especially given her father's history of the same problems.    Medication Adjustments/Labs and Tests Ordered: Current medicines are reviewed at length  with the patient today.  Concerns regarding medicines are outlined above.  Orders Placed This Encounter  Procedures   Ambulatory referral to Genetics   No orders of the defined types were placed in this encounter.    Signed, Hilton Cork. Quentin Ore, MD, [facility], [facility] 02/05/2022 11:03 PM    Electrophysiology [facility]

## 2022-02-05 NOTE — Patient Instructions (Addendum)
Medication Instructions:  Your physician recommends that you continue on your current medications as directed. Please refer to the Current Medication list given to you today.  *If you need a refill on your cardiac medications before your next appointment, please call your pharmacy*   Lab Work: None ordered.  If you have labs (blood work) drawn today and your tests are completely normal, you will receive your results only by: [place] (if you have MyChart) OR A paper copy in the mail If you have any lab test that is abnormal or we need to change your treatment, we will call you to review the results.   Testing/Procedures: Your physician has recommended that you have an ablation. Catheter ablation is a medical procedure used to treat some cardiac arrhythmias (irregular heartbeats). During catheter ablation, a long, thin, flexible tube is put into a blood vessel in your groin (upper thigh), or neck. This tube is called an ablation catheter. It is then guided to your heart through the blood vessel. Radio frequency waves destroy small areas of heart tissue where abnormal heartbeats may cause an arrhythmia to start. Please see the instruction sheet given to you today.   Dr Quentin Ore has requested a referral to our Genetics doctor.    Follow-Up: At [facility], you and your health needs are our priority.  As part of our continuing mission to provide you with exceptional heart care, we have created designated Provider Care Teams.  These Care Teams include your primary Cardiologist (physician) and Advanced Practice Providers (APPs -  Physician Assistants and Nurse Practitioners) who all work together to provide you with the care you need, when you need it.  We recommend signing up for the patient portal called "MyChart".  Sign up information is provided on this After Visit Summary.  MyChart is used to connect with patients for Virtual Visits (Telemedicine).  Patients are able to view  lab/test results, encounter notes, upcoming appointments, etc.  Non-urgent messages can be sent to your provider as well.   To learn more about what you can do with MyChart, go to [email or web address].    Your next appointment:   To be scheduled  Important Information About Sugar      Cardiac Ablation Cardiac ablation is a procedure to destroy, or ablate, a small amount of heart tissue that is causing problems. The heart has many electrical connections. Sometimes, these connections are abnormal and can cause the heart to beat very fast or irregularly. Ablating the abnormal areas can improve the heart's rhythm or return it to normal. Ablation may be done for people who: Have irregular or rapid heartbeats (arrhythmias). Have Wolff-Parkinson-White syndrome. Have taken medicines for an arrhythmia that did not work or caused side effects. Have a high-risk heartbeat that may be life-threatening. Tell a health care provider about: Any allergies you have. All medicines you are taking, including vitamins, herbs, eye drops, creams, and over-the-counter medicines. Any problems you or family members have had with anesthesia. Any bleeding problems you have. Any surgeries you have had. Any medical conditions you have. Whether you are pregnant or may be pregnant. What are the risks? Your health care provider will talk with you about risks. These may include: Infection. Bruising and bleeding. Stroke or blood clots. Damage to nearby structures or organs. Allergic reaction to medicines or dyes. Needing a pacemaker if the heart gets damaged. A pacemaker is a device that helps the heart beat normally. Failure of the procedure. A repeat procedure may be needed.  What happens before the procedure? Medicines Ask your health care provider about: Changing or stopping your regular medicines. These include any heart rhythm medicines, diabetes medicines, or blood thinners you take. Taking medicines such  as aspirin and ibuprofen. These medicines can thin your blood. Do not take them unless your health care provider tells you to. Taking over-the-counter medicines, vitamins, herbs, and supplements. General instructions Follow instructions from your health care provider about what you may eat and drink. If you will be going home right after the procedure, plan to have a responsible adult: Take you home from the hospital or clinic. You will not be allowed to drive. Care for you for the time you are told. Ask your health care provider what steps will be taken to prevent infection. What happens during the procedure?  An IV will be inserted into one of your veins. You may be given: A sedative. This helps you relax. Anesthesia. This will: Numb certain areas of your body. An incision will be made in your neck or your groin. A needle will be inserted through the incision and into a large vein in your neck or groin. The small, thin tube (catheter) will be inserted through the needle and moved to your heart. A type of X-ray (fluoroscopy) will be used to help guide the catheter and provide images of the heart on a monitor. Dye may be injected through the catheter to help your surgeon see the area of the heart that needs treatment. Electrical currents will be sent from the catheter to destroy heart tissue in certain areas. There are three types of energy that may be used to do this: Heat (radiofrequency energy). Laser energy. Extreme cold (cryoablation). When the tissue has been destroyed, the catheter will be removed. Pressure will be held on the insertion area to prevent bleeding. A bandage (dressing) will be placed over the insertion area. The procedure may vary among health care providers and hospitals. What happens after the procedure? Your blood pressure, heart rate and rhythm, breathing rate, and blood oxygen level will be monitored until you leave the hospital or clinic. Your insertion area  will be checked for bleeding. You will need to lie still for a few hours. If your groin was used, you will need to keep your leg straight for a few hours after the catheter is removed. This information is not intended to replace advice given to you by your health care provider. Make sure you discuss any questions you have with your health care provider. Document Revised: 08/15/2021 Document Reviewed: 08/15/2021 Elsevier Patient Education  [place].

## 2022-02-05 NOTE — Telephone Encounter (Signed)
Pt was seen today by Dr.Lambert pt forgot to mention she needed a refill on her medication : DILTIAZEM. Pt is requesting it to be sent via mail  Thank you

## 2022-02-08 NOTE — Telephone Encounter (Signed)
Diltiazem refill was sent to pharmacy on file on 01/22/2022 #90 with 1RF

## 2022-02-12 ENCOUNTER — Telehealth: Payer: Self-pay | Admitting: Adult Health

## 2022-02-12 ENCOUNTER — Other Ambulatory Visit: Payer: Self-pay

## 2022-02-12 DIAGNOSIS — I480 Paroxysmal atrial fibrillation: Secondary | ICD-10-CM

## 2022-02-12 NOTE — Telephone Encounter (Signed)
LVM to RC 

## 2022-02-12 NOTE — Telephone Encounter (Signed)
Patient's husband Keeon lvm stating that he wanted to share some information with RM that could help in Enma's care. Has appt 12/7 but would like a phone call to discuss before appt. [phone]

## 2022-02-13 ENCOUNTER — Telehealth: Payer: Self-pay | Admitting: Cardiology

## 2022-02-13 DIAGNOSIS — I480 Paroxysmal atrial fibrillation: Secondary | ICD-10-CM | POA: Diagnosis not present

## 2022-02-13 DIAGNOSIS — E669 Obesity, unspecified: Secondary | ICD-10-CM | POA: Diagnosis not present

## 2022-02-13 DIAGNOSIS — F411 Generalized anxiety disorder: Secondary | ICD-10-CM | POA: Diagnosis not present

## 2022-02-13 DIAGNOSIS — F319 Bipolar disorder, unspecified: Secondary | ICD-10-CM | POA: Diagnosis not present

## 2022-02-13 DIAGNOSIS — R6889 Other general symptoms and signs: Secondary | ICD-10-CM | POA: Diagnosis not present

## 2022-02-13 DIAGNOSIS — Z7901 Long term (current) use of anticoagulants: Secondary | ICD-10-CM | POA: Diagnosis not present

## 2022-02-13 DIAGNOSIS — I82431 Acute embolism and thrombosis of right popliteal vein: Secondary | ICD-10-CM | POA: Diagnosis not present

## 2022-02-13 NOTE — Telephone Encounter (Signed)
Second attempt to contact patient's husband. LVM to RC.

## 2022-02-13 NOTE — Telephone Encounter (Signed)
Patient calling to see if dr sent recommendation for weight loss pills the patient is able to take to her PCP. Please advise

## 2022-02-14 ENCOUNTER — Ambulatory Visit: Payer: Medicare HMO | Admitting: Adult Health

## 2022-02-14 NOTE — Addendum Note (Signed)
Addended by: James Ivanoff D on: 02/14/2022 04:17 PM   Modules accepted: Orders

## 2022-02-14 NOTE — Telephone Encounter (Signed)
Was able to reach husband and he relayed some information about patient that he feels will be beneficial to [place]. She has an appt with Barnett Applebaum tomorrow and info was relayed to her.

## 2022-02-15 ENCOUNTER — Telehealth: Payer: Self-pay | Admitting: Adult Health

## 2022-02-15 ENCOUNTER — Ambulatory Visit: Payer: Medicare HMO | Admitting: Adult Health

## 2022-02-15 ENCOUNTER — Encounter: Payer: Self-pay | Admitting: Adult Health

## 2022-02-15 DIAGNOSIS — F411 Generalized anxiety disorder: Secondary | ICD-10-CM | POA: Diagnosis not present

## 2022-02-15 DIAGNOSIS — F310 Bipolar disorder, current episode hypomanic: Secondary | ICD-10-CM | POA: Diagnosis not present

## 2022-02-15 DIAGNOSIS — G4700 Insomnia, unspecified: Secondary | ICD-10-CM

## 2022-02-15 DIAGNOSIS — F422 Mixed obsessional thoughts and acts: Secondary | ICD-10-CM | POA: Diagnosis not present

## 2022-02-15 DIAGNOSIS — F4310 Post-traumatic stress disorder, unspecified: Secondary | ICD-10-CM

## 2022-02-15 DIAGNOSIS — F09 Unspecified mental disorder due to known physiological condition: Secondary | ICD-10-CM | POA: Diagnosis not present

## 2022-02-15 DIAGNOSIS — R6889 Other general symptoms and signs: Secondary | ICD-10-CM | POA: Diagnosis not present
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
## 2022-02-15 NOTE — Telephone Encounter (Signed)
Pt called asking questions about meds and requesting call back contact # [phone]  Stated depression worsens from time to time.

## 2022-02-16 ENCOUNTER — Telehealth: Payer: Self-pay

## 2022-02-16 NOTE — Telephone Encounter (Signed)
   Patient Name: Emily Rice  DOB: 1960/03/30 MRN: [id number]  Primary Cardiologist: None  Chart reviewed as part of pre-operative protocol coverage.  Patient's primary cardiologist is with [facility], Dr. Doristine Bosworth, not [place].  Therefore, recommendations on clearance for upcoming surgery as well as recommendations for holding warfarin prior to surgery should come from managing provider.  I will route this recommendation to the requesting party via Epic fax function and remove from pre-op pool.  Please call with questions.  Lenna Sciara, NP 02/16/2022, 12:58 PM

## 2022-02-16 NOTE — Telephone Encounter (Signed)
..     Pre-operative Risk Assessment    Patient Name: Emily Rice  DOB: [birth year] MRN: [id number]      Request for Surgical Clearance    Procedure:   HYSTEROSCOPY, D& [place]  Date of Surgery:  Clearance TBD                                 Surgeon:  W[place] or Practice Name:  [facility] Phone number:  [phone] Fax number:  [phone]   Type of Clearance Requested:   - Medical  - Pharmacy:  Hold Warfarin (Coumadin)     Type of Anesthesia:  Not Indicated   Additional requests/questions:    Gwenlyn Found   02/16/2022, 12:39 PM

## 2022-02-21 NOTE — Telephone Encounter (Signed)
Discussed with Barnett Applebaum.

## 2022-02-26 NOTE — Telephone Encounter (Signed)
Not particularly, cardiology usually has a preference for GLP1RAs due to better efficacy and cardiovascular benefit. She has Medicare insurance and no diagnosis of diabetes though so her insurance won't cover these medications. Would defer to her PCP for other recommendations.

## 2022-02-26 NOTE — Telephone Encounter (Signed)
Routed to patients PCP.

## 2022-02-28 ENCOUNTER — Ambulatory Visit: Payer: Medicare HMO | Admitting: Psychiatry

## 2022-02-28 DIAGNOSIS — F310 Bipolar disorder, current episode hypomanic: Secondary | ICD-10-CM | POA: Diagnosis not present
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
## 2022-03-01 DIAGNOSIS — Z7901 Long term (current) use of anticoagulants: Secondary | ICD-10-CM | POA: Diagnosis not present

## 2022-03-15 ENCOUNTER — Ambulatory Visit (INDEPENDENT_AMBULATORY_CARE_PROVIDER_SITE_OTHER): Payer: Medicaid Other | Admitting: Adult Health

## 2022-03-15 ENCOUNTER — Encounter: Payer: Self-pay | Admitting: Adult Health

## 2022-03-15 DIAGNOSIS — Z7901 Long term (current) use of anticoagulants: Secondary | ICD-10-CM | POA: Diagnosis not present

## 2022-03-15 DIAGNOSIS — F310 Bipolar disorder, current episode hypomanic: Secondary | ICD-10-CM | POA: Diagnosis not present

## 2022-03-15 MED ORDER — LAMOTRIGINE 200 MG PO TABS: 200.0000 mg | ORAL_TABLET | Freq: Two times a day (BID) | ORAL | 0 refills | Status: DC

## 2022-03-15 MED ORDER — ESCITALOPRAM OXALATE 10 MG PO TABS: 10.0000 mg | ORAL_TABLET | Freq: Every day | ORAL | 0 refills | Status: DC

## 2022-03-15 MED ORDER — QUETIAPINE FUMARATE 150 MG PO TABS: 150.0000 mg | ORAL_TABLET | Freq: Every day | ORAL | 2 refills | Status: DC
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
## 2022-03-20 ENCOUNTER — Telehealth: Payer: Self-pay | Admitting: Adult Health

## 2022-03-20 NOTE — Telephone Encounter (Signed)
Patient increased her dose of Seroquel to 150 mg last night. She slept thru her alarm this AM and the day is a blur. She is slurring her speech, having difficulty communicating. She denied facial droop. Told her I would have Dhani call her.
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
## 2022-03-20 NOTE — Telephone Encounter (Signed)
Pt needs call back ASAP. Issues in her head contact # [phone]

## 2022-03-20 NOTE — Telephone Encounter (Signed)
Called and spoke with patient.

## 2022-03-21 ENCOUNTER — Ambulatory Visit: Payer: Medicare HMO | Admitting: Psychiatry

## 2022-03-22 ENCOUNTER — Other Ambulatory Visit: Payer: Self-pay

## 2022-03-22 ENCOUNTER — Telehealth: Payer: Self-pay | Admitting: Adult Health

## 2022-03-22 DIAGNOSIS — F310 Bipolar disorder, current episode hypomanic: Secondary | ICD-10-CM

## 2022-03-22 MED ORDER — QUETIAPINE FUMARATE 150 MG PO TABS: 150.0000 mg | ORAL_TABLET | Freq: Every day | ORAL | 0 refills | Status: DC

## 2022-03-22 MED ORDER — QUETIAPINE FUMARATE 100 MG PO TABS: 100.0000 mg | ORAL_TABLET | Freq: Every day | ORAL | 0 refills | Status: DC

## 2022-03-22 NOTE — Telephone Encounter (Signed)
Sent rx for 100 mg tab

## 2022-03-22 NOTE — Telephone Encounter (Signed)
That's fine - send the '100mg'$  tablet.

## 2022-03-22 NOTE — Telephone Encounter (Signed)
She shouldn't abruptly stop the medication. It's not just for sleep, but also mood stabilization. She's been doing well on the '100mg'$  - may need to take earlier in the evening. How many hours is she averaging.

## 2022-03-22 NOTE — Telephone Encounter (Signed)
See previous message from 03/20/22.[name] just called and said that there has been no change from the previous message on 03/20/22. She still can't wake up. Her phone number is [phone].

## 2022-03-22 NOTE — Telephone Encounter (Signed)
Pt stated she has been taking 2 of the Seroquel 50 mg tablet HS.She is still having the same issue as before with not being able to wake up the next day.She said she will not take the 50 mg tabs anymore.Please advise

## 2022-03-22 NOTE — Telephone Encounter (Signed)
She stated she initially was taking 150 mg in total, 3 50 mg tablets.She thinks the fact that the tablets are 50 mg instead of 1 100 mg tablet that this is the problem.She has been sleeping from 10 pm to 2 pm the next day but did not have this problem on only 1 100 mg tablet.She wants an rx sent for 1 100 mg tablet and not 2 50 mg tablets

## 2022-03-25 ENCOUNTER — Other Ambulatory Visit: Payer: Self-pay | Admitting: Adult Health

## 2022-03-25 DIAGNOSIS — F310 Bipolar disorder, current episode hypomanic: Secondary | ICD-10-CM

## 2022-03-30 ENCOUNTER — Telehealth: Payer: Self-pay | Admitting: Cardiology

## 2022-03-30 ENCOUNTER — Other Ambulatory Visit: Payer: Self-pay

## 2022-03-30 DIAGNOSIS — I480 Paroxysmal atrial fibrillation: Secondary | ICD-10-CM

## 2022-03-30 MED ORDER — PREDNISONE 50 MG PO TABS: ORAL_TABLET | ORAL | 0 refills | Status: DC

## 2022-03-30 MED ORDER — DIPHENHYDRAMINE HCL 50 MG PO TABS: 50.0000 mg | ORAL_TABLET | Freq: Once | ORAL | 0 refills | Status: DC

## 2022-03-30 MED ORDER — DILTIAZEM HCL ER COATED BEADS 360 MG PO CP24: 360.0000 mg | ORAL_CAPSULE | Freq: Every morning | ORAL | 1 refills | Status: DC

## 2022-03-30 NOTE — Telephone Encounter (Signed)
Patient is requesting a call back to discuss instructions for 2/29 ablation with Dr. Quentin Ore.

## 2022-03-30 NOTE — Telephone Encounter (Signed)
Spoke with patient and answered questions in regards to her procedure. She has been scheduled for lab work on 2/16 and would like to review instructions for her CT scan and ablation when she comes in for labs. Instructions have also been placed in patient's MyChart. I have sent in pre-medications for CT scan as well due to contrast dye allergy.

## 2022-03-30 NOTE — Telephone Encounter (Signed)
Patient is on coumadin followed by PCP, Dr. Doristine Bosworth. Called a left a message with her nurse advising that patient will need 3 therapeutic weekly INR's prior to her procedure on 2/29.

## 2022-04-02 ENCOUNTER — Ambulatory Visit: Payer: Medicare HMO | Admitting: Psychiatry

## 2022-04-02 ENCOUNTER — Telehealth: Payer: Self-pay | Admitting: Cardiology

## 2022-04-02 NOTE — Telephone Encounter (Signed)
Left message for patient to call back

## 2022-04-02 NOTE — Telephone Encounter (Signed)
  Pt is returning call

## 2022-04-02 NOTE — Telephone Encounter (Signed)
Patient calling in with several questions. She says she has questions about her PTINR dates and has question about her Cardizem blood draw at [facility]. She says she is having her PTINR drawn 2/15 and the Cardizem one is the next day. She wants to clarify if they need to be done on separate days. She wants to know what is the Cardizem blood draw. She also wants to clarify that her chest CT is on 2/23 and she says she is allergic to some of the dyes and contrasts. She says she did pick up the 2 medications she was prescribed and wants to know if they were pre meds for the procedure or the CT. She says it seems too soon to get pre meds for her procedure. She says she wants to make sure the office is aware she is allergic to the dye. She says she has had to wait for 4 hours after taking a pre med to have a CT. She also says she thinks she is supposed to see a nurse one day. She says her procedure is 2/28 and wants to know where it will take place.

## 2022-04-02 NOTE — Telephone Encounter (Signed)
Spoke with the patient and answered all questions in regards to pre-procedure testing.  She is scheduled for INR checks with her PCP on 2/8, 2/15, and 2/22

## 2022-04-06 ENCOUNTER — Ambulatory Visit: Payer: Medicare PPO | Admitting: Psychiatry

## 2022-04-06 DIAGNOSIS — F310 Bipolar disorder, current episode hypomanic: Secondary | ICD-10-CM | POA: Diagnosis not present
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
## 2022-04-09 ENCOUNTER — Telehealth: Payer: Self-pay | Admitting: Cardiology

## 2022-04-09 DIAGNOSIS — I480 Paroxysmal atrial fibrillation: Secondary | ICD-10-CM

## 2022-04-09 NOTE — Telephone Encounter (Signed)
Patient stated she is having a PT-INR test in her regular doctor's office on 2/2 and wants to know if she will still need to do lab test on 2/16.

## 2022-04-09 NOTE — Telephone Encounter (Signed)
Left message for patient to call back  

## 2022-04-10 NOTE — Telephone Encounter (Signed)
Spoke with the patient who states that she is wondering if getting her INR checked on 2/2 was too soon. She is getting it checked on 2/16 and 2/23 as well. Advised to push out the 2/2 check to 2/8 to have three weeks in a row of checks prior to her procedure.

## 2022-04-13 ENCOUNTER — Ambulatory Visit: Payer: Medicare HMO | Admitting: Adult Health

## 2022-04-13 DIAGNOSIS — H2513 Age-related nuclear cataract, bilateral: Secondary | ICD-10-CM | POA: Diagnosis not present

## 2022-04-14 ENCOUNTER — Other Ambulatory Visit: Payer: Self-pay | Admitting: Adult Health

## 2022-04-14 DIAGNOSIS — F310 Bipolar disorder, current episode hypomanic: Secondary | ICD-10-CM

## 2022-04-16 NOTE — Telephone Encounter (Signed)
Due 2/6

## 2022-04-17 ENCOUNTER — Other Ambulatory Visit: Payer: Self-pay | Admitting: Adult Health

## 2022-04-17 DIAGNOSIS — F310 Bipolar disorder, current episode hypomanic: Secondary | ICD-10-CM

## 2022-04-19 ENCOUNTER — Telehealth: Payer: Self-pay | Admitting: Cardiology

## 2022-04-19 DIAGNOSIS — Z7901 Long term (current) use of anticoagulants: Secondary | ICD-10-CM | POA: Diagnosis not present

## 2022-04-19 NOTE — Telephone Encounter (Signed)
Patient would like a call back to discuss upcoming CT test.

## 2022-04-19 NOTE — Telephone Encounter (Signed)
Left message for patient to call back  

## 2022-04-24 ENCOUNTER — Encounter: Payer: Medicare PPO | Admitting: Genetic Counselor

## 2022-04-25 ENCOUNTER — Ambulatory Visit: Payer: Medicare PPO | Admitting: Psychiatry

## 2022-04-27 ENCOUNTER — Ambulatory Visit (INDEPENDENT_AMBULATORY_CARE_PROVIDER_SITE_OTHER): Payer: Medicare PPO | Admitting: Psychiatry

## 2022-04-27 ENCOUNTER — Ambulatory Visit: Payer: Medicare PPO | Attending: Cardiology

## 2022-04-27 DIAGNOSIS — F310 Bipolar disorder, current episode hypomanic: Secondary | ICD-10-CM

## 2022-04-27 DIAGNOSIS — I480 Paroxysmal atrial fibrillation: Secondary | ICD-10-CM

## 2022-04-27 DIAGNOSIS — Z7901 Long term (current) use of anticoagulants: Secondary | ICD-10-CM | POA: Diagnosis not present
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
## 2022-04-28 LAB — CBC
Hematocrit: 39.7 % (ref 34.0–46.6)
Hemoglobin: 13 g/dL (ref 11.1–15.9)
MCH: 26.2 pg — ABNORMAL LOW (ref 26.6–33.0)
MCHC: 32.7 g/dL (ref 31.5–35.7)
MCV: 80 fL (ref 79–97)
Platelets: 239 10*3/uL (ref 150–450)
RBC: 4.97 x10E6/uL (ref 3.77–5.28)
RDW: 16 % — ABNORMAL HIGH (ref 11.7–15.4)
WBC: 7.6 10*3/uL (ref 3.4–10.8)

## 2022-04-28 LAB — BASIC METABOLIC PANEL
BUN/Creatinine Ratio: 12 (ref 9–23)
BUN: 12 mg/dL (ref 6–24)
CO2: 26 mmol/L (ref 20–29)
Calcium: 9.4 mg/dL (ref 8.7–10.2)
Chloride: 103 mmol/L (ref 96–106)
Creatinine, Ser: 0.99 mg/dL (ref 0.57–1.00)
Glucose: 109 mg/dL — ABNORMAL HIGH (ref 70–99)
Potassium: 4.2 mmol/L (ref 3.5–5.2)
Sodium: 143 mmol/L (ref 134–144)
eGFR: 66 mL/min/{1.73_m2} (ref >59)

## 2022-04-30 ENCOUNTER — Telehealth: Payer: Self-pay | Admitting: Cardiology

## 2022-04-30 DIAGNOSIS — I480 Paroxysmal atrial fibrillation: Secondary | ICD-10-CM

## 2022-04-30 NOTE — Telephone Encounter (Signed)
Patient wanted clarification on what time to be at the hospital for her procedure. Explained and verified the correct time and location. Patient voiced understanding.

## 2022-04-30 NOTE — Telephone Encounter (Signed)
Patient calling to speak to the nurse about her procedure  on 2/29. Please advise

## 2022-05-01 ENCOUNTER — Encounter: Payer: Self-pay | Admitting: Adult Health

## 2022-05-01 ENCOUNTER — Ambulatory Visit (INDEPENDENT_AMBULATORY_CARE_PROVIDER_SITE_OTHER): Payer: Medicare PPO | Admitting: Adult Health

## 2022-05-01 DIAGNOSIS — F310 Bipolar disorder, current episode hypomanic: Secondary | ICD-10-CM

## 2022-05-01 MED ORDER — QUETIAPINE FUMARATE 150 MG PO TABS: 150.0000 mg | ORAL_TABLET | Freq: Every day | ORAL | 2 refills | Status: DC

## 2022-05-01 MED ORDER — ALPRAZOLAM 1 MG PO TABS: ORAL_TABLET | ORAL | 0 refills | Status: DC
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
## 2022-05-02 ENCOUNTER — Telehealth: Payer: Self-pay | Admitting: Cardiology

## 2022-05-02 NOTE — Telephone Encounter (Signed)
New Message:    Patient is scheduled to have an Ablation on 05-10-22. She found out today, that she have infection in her gums. She wants to know whether she will still be able to have the Ablation?

## 2022-05-02 NOTE — Telephone Encounter (Signed)
Spoke with the patient who reports that she has an infection in her gums and is currently at the dentist. She states that they are trying to work out a plan with her PCP for treatment. She is unsure whether she is able to get antibiotics for it.

## 2022-05-02 NOTE — Telephone Encounter (Signed)
Spoke with Dr. Quentin Ore and we will post-pone her ablation until she is completely healed from infection.   Left message for patient to call back to let her know we will be cancelling her procedure.

## 2022-05-03 ENCOUNTER — Telehealth: Payer: Self-pay

## 2022-05-03 ENCOUNTER — Telehealth: Payer: Self-pay | Admitting: Adult Health

## 2022-05-03 NOTE — Telephone Encounter (Signed)
LVM to RC 

## 2022-05-03 NOTE — Telephone Encounter (Signed)
Patient was seen by you on Tuesday. She said yesterday and today her anxiety is bad and she has needed 3 of the 1 mg alprazolam (not taken at the same time). She denies SI, but I did review emergency resources with her. She was supposed to get some dental work done yesterday and her PCP and cardiologist were not on board with it, so she didn't get it done and there were a lot of back and forth phone calls. The [facility] that picks her up for her appt was 2 hours late picking her up afterwards. She said she is taking the medications as prescribed on Tuesday.

## 2022-05-03 NOTE — Telephone Encounter (Signed)
Pt asking to speak with you again

## 2022-05-03 NOTE — Telephone Encounter (Signed)
Spoke with the patient again in regards to dental procedure that she needs to have done due to infection. I advised once again that we need a request from her dentist if they need cardiac clearance so we know exactly what they are doing and we can advise accordingly.

## 2022-05-03 NOTE — Telephone Encounter (Signed)
I talked to patient and sent Barnett Applebaum a note.

## 2022-05-03 NOTE — Telephone Encounter (Signed)
Pt called at 3:50p.  She said to tell gina her anxiety is worse the last couple of days.  She has been taking extra Xanax.  Pls call her back asap.

## 2022-05-03 NOTE — Telephone Encounter (Signed)
Pt is returning call. Requesting return call.

## 2022-05-03 NOTE — Telephone Encounter (Signed)
Left message for patient advising her that her ablation has been rescheduled to 4/1 with Dr. Quentin Ore. Advised to call back.

## 2022-05-03 NOTE — Telephone Encounter (Signed)
Pt is returning call.  

## 2022-05-03 NOTE — Telephone Encounter (Signed)
Noted. I do not want to increase the Xanax.

## 2022-05-03 NOTE — Telephone Encounter (Signed)
Spoke with the patient who states that her PCP will not sign off for her to have injections in her gums to treat her infection. She states that PCP told her that cardiology would need to sign off. She is unsure whether she would need to hold her coumadin to have this done. I advised that if her dentist is needing cardiac clearance then they will need to fax [place] a request. She verbalized understanding.   Patient confirms that April 1st will work for her ablation. I will send updated dates through [place]. She will keep [place] updated if interruption in coumadin is needed in case we need to reschedule her ablation.

## 2022-05-03 NOTE — Telephone Encounter (Signed)
Spoke with the patient and advised that we are going to cancel her ablation for next week. I will call her back with a new date and time.

## 2022-05-04 ENCOUNTER — Ambulatory Visit (HOSPITAL_BASED_OUTPATIENT_CLINIC_OR_DEPARTMENT_OTHER): Admission: RE | Admit: 2022-05-04 | Payer: Medicare PPO | Source: Ambulatory Visit

## 2022-05-04 ENCOUNTER — Telehealth: Payer: Self-pay | Admitting: Cardiology

## 2022-05-04 NOTE — Telephone Encounter (Signed)
Pt returning nurses call from the phone note made on 05/02/22. Please advise.

## 2022-05-04 NOTE — Telephone Encounter (Signed)
See above phone note.  

## 2022-05-04 NOTE — Telephone Encounter (Signed)
Left message for patient to call back  

## 2022-05-04 NOTE — Telephone Encounter (Signed)
Spoke with the patient who would like to know if we have received anything from her dentist. Advised that I have not seen anything. She states the dentist is now closed for the afternoon so she will check back on Monday.

## 2022-05-04 NOTE — Telephone Encounter (Signed)
Patient is calling back to speak to you. Please advise

## 2022-05-07 ENCOUNTER — Telehealth: Payer: Self-pay | Admitting: Cardiology

## 2022-05-07 NOTE — Telephone Encounter (Signed)
Covering preop today. Callback, can you please clarify what a periodontal cleaning entails?   Patient is noted to be on warfarin for hx of DVT/PE; also afib - we do not manage this patient's Coumadin. Her primary cardiologist is Dr. Terri Skains. Dr. Quentin Ore has seen for management of atrial fib. If just a routine dental cleaning, we do not typically need any cardiac clearance or holding of anticoagulation and I am happy to send that in official writing from our office but would still need clearance from Dr. Terri Skains regarding any SBE needs as we do not manage her general cardiology status. Let me know if it is anticipated to be more in depth than that.

## 2022-05-07 NOTE — Telephone Encounter (Signed)
   Pre-operative Risk Assessment    Patient Name: Emily Rice  DOB: 1960/05/02 MRN: [id number]      Request for Surgical Clearance    Procedure:   Periodontal cleaning   Date of Surgery:  Clearance TBD                                 Surgeon:  Dr. Al Decant Group or Practice Name:  [facility]  Phone number:  [phone] Fax number:  [phone]    Type of Clearance Requested:   - Medical  - Pharmacy:  Hold Warfarin (Coumadin)     Type of Anesthesia:  None    Additional requests/questions:      Dorthey Sawyer   05/07/2022, 10:05 AM

## 2022-05-07 NOTE — Telephone Encounter (Signed)
I tried to reach DDS office for clarification for procedure. Left message for the DDS office to call back and give clarification as to what periodontal cleaning entails.

## 2022-05-07 NOTE — Telephone Encounter (Signed)
Spoke with the patient and advised that we received request from the dentist and we are working on it.

## 2022-05-07 NOTE — Telephone Encounter (Signed)
Pt called to requesting to speak with nurse Percival Spanish

## 2022-05-09 NOTE — Telephone Encounter (Signed)
   Patient Name: Emily Rice  DOB: [birth year] MRN: [id number]  Primary Cardiologist: [name], DO Primary EP: Dr. Quentin Ore  Chart reviewed as part of pre-operative protocol coverage. We see this patient for PAF, but her general cardiologist is Dr. Terri Skains.     Simple dental extractions (i.e. 1-2 teeth) are considered low risk procedures per guidelines and generally do not require any specific cardiac clearance. It is also generally accepted that for simple extractions and dental cleanings, there is no need to interrupt blood thinner therapy.  We do not manage her coumadin, but should not need to hold for deep cleaning. Furthermore, we do not hold anticoagulation prior to Afib ablation, which is scheduled for  06/11/22.  I can't provide recommendations for SBE PPX - this will need to come from her general cardiologist, who is not with our group.   I will route this recommendation to the requesting party via Epic fax function and remove from pre-op pool.  Please call with questions.  Tami Lin Audia Amick, PA 05/09/2022, 12:09 PM

## 2022-05-09 NOTE — Telephone Encounter (Signed)
Spoke with requesting office and they stated that patient is having a deep cleaning and no extractions

## 2022-05-10 ENCOUNTER — Telehealth: Payer: Self-pay | Admitting: Cardiology

## 2022-05-10 NOTE — Telephone Encounter (Signed)
Patient was calling back. Please advise

## 2022-05-10 NOTE — Telephone Encounter (Signed)
Spoke to pt and gave her some available dates. She will discuss with her Husband and call me back.

## 2022-05-10 NOTE — Telephone Encounter (Signed)
Pt has been rescheduled to 09/10/22.  [facility], [place] and [place] have all been notified.

## 2022-05-10 NOTE — Telephone Encounter (Signed)
Follow Up:      Patient said her Ablation for today(05-10-22) was cancelled. She would like to know the new date for it please.

## 2022-05-10 NOTE — Telephone Encounter (Signed)
Pt called in to r/s her ablation, please advise.

## 2022-05-10 NOTE — Telephone Encounter (Signed)
Follow Up:     Patient is calling back to see about her clearance for her dental work. She said she had not heard anything.

## 2022-05-10 NOTE — Telephone Encounter (Signed)
Spoke with the patient and advised on updated date and time for her ablation as well as her CT scan. Lab work has also been scheduled.

## 2022-05-10 NOTE — Telephone Encounter (Signed)
I called the pt back who asked about her clearance for dental work to be done. I reviewed the notes from the pre op APP with the pt. I read word for word as to the recommendations that we had advised the DDS office.  See notes:  I reviewed these notes x 6 with the pt on the phone. The pt kept saying I was not answering her questions, though she kept asking who needs to give clearance for the warfarin and who needs to give ok for ABX. I again stated PCP manages her warfarin and primary card Dr. Terri Skains needs to give ok for ABX, per the notes from pre op APP. I have tried to get the pt to understand and she just kept going over the same questions over and over. She tells me that Dr. Quentin Ore ordered an ABX, I do not see an ABX on the med list. I do see that Dr. Quentin Ore did order the prednisone for the pt. I will update the requesting dental office as to todays notes.  Ledora Bottcher, [place]      05/09/22 12:11 PM Note     Patient Name: Emily Rice  DOB: [birth year] MRN: [id number]   Primary Cardiologist: [name], DO Primary EP: Dr. Quentin Ore   Chart reviewed as part of pre-operative protocol coverage. We see this patient for PAF, but her general cardiologist is Dr. Terri Skains.      Simple dental extractions (i.e. 1-2 teeth) are considered low risk procedures per guidelines and generally do not require any specific cardiac clearance. It is also generally accepted that for simple extractions and dental cleanings, there is no need to interrupt blood thinner therapy.   We do not manage her coumadin, but should not need to hold for deep cleaning. Furthermore, we do not hold anticoagulation prior to Afib ablation, which is scheduled for  06/11/22.   I can't provide recommendations for SBE PPX - this will need to come from her general cardiologist, who is not with our group.     I will route this recommendation to the requesting party via Epic fax function and remove from pre-op pool.   Please call with  questions.   Tami Lin Duke, PA 05/09/2022, 12:09 PM             05/09/22 11:43 AM Mendel Ryder, CMA routed this conversation to [facility]   Mendel Ryder, [facility]      05/09/22 11:43 AM Note Spoke with requesting office and they stated that patient is having a deep cleaning and no extractions      May 07, 2022  Me      05/07/22 12:01 PM Note I tried to reach DDS office for clarification for procedure. Left message for the DDS office to call back and give clarification as to what periodontal cleaning entails.             05/07/22 11:58 AM You contacted [place]          05/07/22 10:20 AM Dunn, Nedra Hai, PA-C routed this conversation to [place], PA-C      05/07/22 10:20 AM Note Covering preop today. Callback, can you please clarify what a periodontal cleaning entails?    Patient is noted to be on warfarin for hx of DVT/PE; also afib - we do not manage this patient's Coumadin. Her primary cardiologist is Dr. Terri Skains. Dr. Quentin Ore has seen for management of atrial fib.  If just a routine dental cleaning, we do not typically need any cardiac clearance or holding of anticoagulation and I am happy to send that in official writing from our office but would still need clearance from Dr. Terri Skains regarding any SBE needs as we do not manage her general cardiology status. Let me know if it is anticipated to be more in depth than that.         CM   05/07/22 10:14 AM Emeline Darling R routed this conversation to [employer]    Emeline Darling R   CM   05/07/22 10:14 AM Note     Pre-operative Risk Assessment    Patient Name: Emily Rice  DOB: Jun 06, 1960 MRN: [id number]        Request for Surgical Clearance     Procedure:   Periodontal cleaning    Date of Surgery:  Clearance TBD                                 Surgeon:  Dr. Al Decant Group or Practice Name:  [facility]  Phone number:  [phone] Fax  number:  [phone]    Type of Clearance Requested:   - Medical  - Pharmacy:  Hold Warfarin (Coumadin)     Type of Anesthesia:  None    Additional requests/questions:       Dorthey Sawyer   05/07/2022, 10:05 AM            AD   05/07/22 10:05 AM [place] contacted [facility]

## 2022-05-11 NOTE — Telephone Encounter (Signed)
Patient lvm requesting to speak with the nurse. She is having some issues that she would like to discuss.

## 2022-05-11 NOTE — Telephone Encounter (Signed)
LVM to RC 

## 2022-05-18 NOTE — Telephone Encounter (Signed)
Was able to reach patient and she said around 9-10 this morning she took 4 Xanax and 2 200 mg lamotrigine. She is prescribed bid Xanax and BID lamotrigine. She said she is not having SI, just wanted to sleep. She said she had been cleaning her house all day and talking to a friend on the phone. She said the medication didn't affect her adversely and she is glad that it didn't. Her speech is not slurred, but a little disorganized.

## 2022-05-18 NOTE — Telephone Encounter (Signed)
She absoultely cannot over take the Xanax - cannot take more than prescribed - will taper her off the medication and discontinue it.

## 2022-05-19 ENCOUNTER — Emergency Department (HOSPITAL_COMMUNITY): Payer: Medicare PPO

## 2022-05-19 ENCOUNTER — Emergency Department (HOSPITAL_COMMUNITY)
Admission: EM | Admit: 2022-05-19 | Discharge: 2022-05-19 | Disposition: A | Discharge status: Home / Self Care | Payer: Medicare PPO | Attending: Emergency Medicine | Admitting: Emergency Medicine

## 2022-05-19 ENCOUNTER — Encounter (HOSPITAL_COMMUNITY): Payer: Self-pay

## 2022-05-19 DIAGNOSIS — Z7901 Long term (current) use of anticoagulants: Secondary | ICD-10-CM | POA: Diagnosis not present

## 2022-05-19 DIAGNOSIS — F319 Bipolar disorder, unspecified: Secondary | ICD-10-CM

## 2022-05-19 DIAGNOSIS — F1014 Alcohol abuse with alcohol-induced mood disorder: Secondary | ICD-10-CM | POA: Diagnosis not present

## 2022-05-19 DIAGNOSIS — R29818 Other symptoms and signs involving the nervous system: Secondary | ICD-10-CM | POA: Diagnosis not present

## 2022-05-19 DIAGNOSIS — R531 Weakness: Secondary | ICD-10-CM | POA: Insufficient documentation

## 2022-05-19 LAB — COMPREHENSIVE METABOLIC PANEL
ALT: 22 U/L (ref 0–44)
AST: 45 U/L — ABNORMAL HIGH (ref 15–41)
Albumin: 4.1 g/dL (ref 3.5–5.0)
Alkaline Phosphatase: 87 U/L (ref 38–126)
Anion gap: 11 (ref 5–15)
BUN: 11 mg/dL (ref 6–20)
CO2: 24 mmol/L (ref 22–32)
Calcium: 8.9 mg/dL (ref 8.9–10.3)
Chloride: 103 mmol/L (ref 98–111)
Creatinine, Ser: 1.16 mg/dL — ABNORMAL HIGH (ref 0.44–1.00)
GFR, Estimated: 54 mL/min — ABNORMAL LOW (ref >60)
Glucose, Bld: 94 mg/dL (ref 70–99)
Potassium: 5.1 mmol/L (ref 3.5–5.1)
Sodium: 138 mmol/L (ref 135–145)
Total Bilirubin: 0.8 mg/dL (ref 0.3–1.2)
Total Protein: 7.2 g/dL (ref 6.5–8.1)

## 2022-05-19 LAB — DIFFERENTIAL
Abs Immature Granulocytes: 0.03 10*3/uL (ref 0.00–0.07)
Basophils Absolute: 0 10*3/uL (ref 0.0–0.1)
Basophils Relative: 1 %
Eosinophils Absolute: 0.1 10*3/uL (ref 0.0–0.5)
Eosinophils Relative: 2 %
Immature Granulocytes: 1 %
Lymphocytes Relative: 26 %
Lymphs Abs: 1.6 10*3/uL (ref 0.7–4.0)
Monocytes Absolute: 0.5 10*3/uL (ref 0.1–1.0)
Monocytes Relative: 8 %
Neutro Abs: 3.9 10*3/uL (ref 1.7–7.7)
Neutrophils Relative %: 62 %

## 2022-05-19 LAB — I-STAT CHEM 8, ED
BUN: 12 mg/dL (ref 6–20)
Calcium, Ion: 1.11 mmol/L — ABNORMAL LOW (ref 1.15–1.40)
Chloride: 104 mmol/L (ref 98–111)
Creatinine, Ser: 1.2 mg/dL — ABNORMAL HIGH (ref 0.44–1.00)
Glucose, Bld: 87 mg/dL (ref 70–99)
HCT: 41 % (ref 36.0–46.0)
Hemoglobin: 13.9 g/dL (ref 12.0–15.0)
Potassium: 4.5 mmol/L (ref 3.5–5.1)
Sodium: 141 mmol/L (ref 135–145)
TCO2: 29 mmol/L (ref 22–32)

## 2022-05-19 LAB — CBC
HCT: 39 % (ref 36.0–46.0)
Hemoglobin: 13 g/dL (ref 12.0–15.0)
MCH: 26.7 pg (ref 26.0–34.0)
MCHC: 33.3 g/dL (ref 30.0–36.0)
MCV: 80.2 fL (ref 80.0–100.0)
Platelets: 205 10*3/uL (ref 150–400)
RBC: 4.86 MIL/uL (ref 3.87–5.11)
RDW: 16.5 % — ABNORMAL HIGH (ref 11.5–15.5)
WBC: 6.2 10*3/uL (ref 4.0–10.5)
nRBC: 0 % (ref 0.0–0.2)

## 2022-05-19 LAB — I-STAT BETA HCG BLOOD, ED (MC, WL, AP ONLY): I-stat hCG, quantitative: <5 m[IU]/mL (ref <5)

## 2022-05-19 LAB — PROTIME-INR
INR: 1.8 — ABNORMAL HIGH (ref 0.8–1.2)
Prothrombin Time: 20.3 seconds — ABNORMAL HIGH (ref 11.4–15.2)

## 2022-05-19 LAB — ETHANOL: Alcohol, Ethyl (B): <10 mg/dL (ref <10)

## 2022-05-19 LAB — CBG MONITORING, ED: Glucose-Capillary: 100 mg/dL — ABNORMAL HIGH (ref 70–99)

## 2022-05-19 LAB — APTT: aPTT: 31 seconds (ref 24–36)

## 2022-05-19 MED ORDER — LORAZEPAM 1 MG PO TABS
0.5000 mg | ORAL_TABLET | Freq: Once | ORAL | Status: AC | PRN
  Administered 2022-05-19: 0.5 mg via ORAL
  Filled 2022-05-19: qty 1

## 2022-05-19 NOTE — ED Notes (Signed)
Patient has returned to room from MRI

## 2022-05-19 NOTE — Discharge Instructions (Signed)
MRI of the brain is negative for stroke.  Blood work is also reassuring. Please ensure that you are followed up closely by your psychiatrist or therapist.

## 2022-05-19 NOTE — ED Provider Notes (Signed)
[facility] Provider Note   CSN: [id number] Arrival date & time: 05/19/22  1413     History  No chief complaint on file.   Emily Rice is a 62 y.o. female.  HPI    62 year old female brought into the emergency room with chief complaint of confusion, dizziness.  Patient is accompanied by her husband.  Patient has history of bipolar disorder, A-fib, PE, tremor disorder and she is on Coumadin.  According to the patient, she woke up at 7:30 AM and that is her last known normal.  She felt okay when she woke up.  She took her morning meds and then was listening to a [employer] and fell asleep.  Patient's husband states that he had woken up and had left before seeing his wife.  When he returned around 1230 he noted that patient was sleeping.  He woke her up, and noted that she was slower than usual with the response and having balance issues.  She was also having some difficulty with word finding and then when she was eating, she was having difficulty swallowing all her food without drooling.  He decided to bring her to the emergency room to make sure there was no stroke.  Patient is on Xanax and other psychiatric medications.  She states that she took her medications as prescribed this morning.  She takes Xanax twice daily and has been on it for several years.  She denies any additional medications today, however few days ago she did take extra pills.  Home Medications Prior to Admission medications   Medication Sig Start Date End Date Taking? Authorizing Provider  acetaminophen (TYLENOL) 325 MG tablet Take 2 tablets (650 mg total) by mouth every 6 (six) hours as needed for mild pain or moderate pain. 04/04/20   Angiulli, Lavon Paganini, PA-C  ALPRAZolam Duanne Moron) 1 MG tablet TAKE 1 TABLET TWICE DAILY 05/01/22   Mozingo, Berdie Ogren, NP  carbidopa-levodopa (SINEMET IR) 25-100 MG tablet Take one tablet in the am and one tablet in the  pm Patient taking differently: Take 1 tablet by mouth 3 (three) times daily. Take one tablet in the am, one tablet in the afternoon, and one tablet in the pm 05/25/20   Meredith Staggers, MD  cetirizine (ZYRTEC) 10 MG tablet Take 10 mg by mouth daily as needed for allergies.    [provider]  Cholecalciferol 75 MCG (3000 UT) TABS Take 3,000 Units by mouth daily. 04/04/20   Angiulli, Lavon Paganini, PA-C  diltiazem (CARDIZEM CD) 360 MG 24 hr capsule Take 1 capsule (360 mg total) by mouth every morning. 03/30/22 09/26/22  Tolia, Sunit, DO  diphenhydrAMINE (BENADRYL ALLERGY EXTRA STR) 50 MG tablet Take 1 tablet (50 mg total) by mouth once for 1 dose. Take 1 tablet 1 hour prior to CT scan. 03/30/22 03/30/22  Vickie Epley, MD  enoxaparin (LOVENOX) 40 MG/0.4ML injection Inject 40 mg into the skin every 12 (twelve) hours.    [provider]  escitalopram (LEXAPRO) 10 MG tablet Take 1 tablet (10 mg total) by mouth at bedtime. 03/15/22   Mozingo, Berdie Ogren, NP  fexofenadine (ALLEGRA) 180 MG tablet Take 180 mg by mouth as needed for allergies or rhinitis.    [provider]  fluticasone (FLONASE) 50 MCG/ACT nasal spray Place 1 spray into both nostrils daily as needed for allergies.     [provider]  lamoTRIgine (LAMICTAL) 200 MG tablet Take 1 tablet (200  mg total) by mouth 2 (two) times daily. 03/15/22   Mozingo, Berdie Ogren, NP  metoCLOPramide (REGLAN) 10 MG tablet Take 1 tablet (10 mg total) by mouth every 6 (six) hours. 12/26/21   Davonna Belling, MD  Multiple Vitamin (MULTIVITAMIN WITH MINERALS) TABS tablet Take 1 tablet by mouth daily. 04/05/20   Angiulli, Lavon Paganini, PA-C  ondansetron (ZOFRAN) 4 MG tablet Take 4 mg by mouth every 6 (six) hours as needed for nausea/vomiting. 12/01/21   [provider]  oxyCODONE-acetaminophen (PERCOCET) 7.5-325 MG tablet Take 1 tablet by mouth every 12 (twelve) hours as needed for pain. 12/20/21   [provider]   polyethylene glycol (MIRALAX / GLYCOLAX) 17 g packet Take 17 g by mouth 2 (two) times daily. Patient taking differently: Take 17 g by mouth daily as needed for mild constipation. 04/04/20   Angiulli, Lavon Paganini, PA-C  predniSONE (DELTASONE) 50 MG tablet Take 1 tablet 13 hours prior to CT scan, take 1 tablet 7 hours prior to CT scan, take 1 tablet 1 hour prior to CT scan. 03/30/22   Vickie Epley, MD  QUEtiapine (SEROQUEL) 100 MG tablet Take 1 tablet (100 mg total) by mouth at bedtime. 03/22/22   Mozingo, Berdie Ogren, NP  QUEtiapine Fumarate 150 MG TABS Take 150 mg by mouth at bedtime. 05/01/22   Mozingo, Berdie Ogren, NP  warfarin (COUMADIN) 5 MG tablet Take 5 mg by mouth daily. 12/15/21   [provider]      Allergies    Compazine [prochlorperazine], Hydromorphone, Hydromorphone, Iodinated contrast media, Prochlorperazine, and Prochlorperazine edisylate    Review of Systems   Review of Systems  Physical Exam Updated Vital Signs BP 118/74 (BP Location: Left Arm)   Pulse 69   Temp 98.1 F (36.7 C) (Oral)   Resp 20   Ht '5\' 3"'$  (1.6 m)   Wt 79.4 kg   LMP  (LMP Unknown)   SpO2 100%   BMI 31.00 kg/m  Physical Exam Vitals and nursing note reviewed.  Constitutional:      Appearance: She is well-developed.  HENT:     Head: Atraumatic.  Eyes:     Extraocular Movements: Extraocular movements intact.     Pupils: Pupils are equal, round, and reactive to light.     Comments: No nystagmus  Cardiovascular:     Rate and Rhythm: Normal rate.  Pulmonary:     Effort: Pulmonary effort is normal.  Musculoskeletal:     Cervical back: Normal range of motion and neck supple.  Skin:    General: Skin is warm and dry.  Neurological:     Mental Status: She is alert and oriented to person, place, and time.     ED Results / Procedures / Treatments   Labs (all labs ordered are listed, but only abnormal results are displayed) Labs Reviewed  PROTIME-INR - Abnormal; Notable for  the following components:      Result Value   Prothrombin Time 20.3 (*)    INR 1.8 (*)    All other components within normal limits  CBC - Abnormal; Notable for the following components:   RDW 16.5 (*)    All other components within normal limits  COMPREHENSIVE METABOLIC PANEL - Abnormal; Notable for the following components:   Creatinine, Ser 1.16 (*)    AST 45 (*)    GFR, Estimated 54 (*)    All other components within normal limits  I-STAT CHEM 8, ED - Abnormal; Notable for the following components:  Creatinine, Ser 1.20 (*)    Calcium, Ion 1.11 (*)    All other components within normal limits  CBG MONITORING, ED - Abnormal; Notable for the following components:   Glucose-Capillary 100 (*)    All other components within normal limits  ETHANOL  APTT  DIFFERENTIAL  RAPID URINE DRUG SCREEN, HOSP PERFORMED  I-STAT BETA HCG BLOOD, ED ([facility], [facility], [facility] ONLY)    EKG None  Radiology MR BRAIN WO CONTRAST  Result Date: 05/19/2022 CLINICAL DATA:  Neuro deficit, acute, stroke suspected. EXAM: MRI HEAD WITHOUT CONTRAST TECHNIQUE: Multiplanar, multiecho pulse sequences of the brain and surrounding structures were obtained without intravenous contrast. COMPARISON:  Head CT 05/19/2022.  MRI brain 08/19/2021. FINDINGS: Brain: No acute infarct or hemorrhage. Unchanged mild chronic small-vessel disease. Old microhemorrhage in the medial left parietal lobe. No hydrocephalus or extra-axial collection. No mass or midline shift. Vascular: Normal flow voids. Skull and upper cervical spine: Normal marrow signal. Upper cervical spondylosis without high-grade spinal canal stenosis. Sinuses/Orbits: Unremarkable. Other: None. IMPRESSION: No acute intracranial abnormality or mass. Electronically Signed   By: Emmit Alexanders M.D.   On: 05/19/2022 16:32   CT Head Wo Contrast  Result Date: 05/19/2022 CLINICAL DATA:  Neuro deficit. EXAM: CT HEAD WITHOUT CONTRAST TECHNIQUE: Contiguous axial images were obtained from  the base of the skull through the vertex without intravenous contrast. RADIATION DOSE REDUCTION: This exam was performed according to the departmental dose-optimization program which includes automated exposure control, adjustment of the mA and/or kV according to patient size and/or use of iterative reconstruction technique. COMPARISON:  MRI head 08/19/2021.  CT head 03/02/2020. FINDINGS: Brain: No evidence of acute infarction, hemorrhage, hydrocephalus, extra-axial collection or mass lesion/mass effect. Vascular: No hyperdense vessel or unexpected calcification. Skull: Normal. Negative for fracture or focal lesion. Sinuses/Orbits: No acute finding. Other: None. IMPRESSION: No acute intracranial abnormality. Electronically Signed   By: Ronney Asters M.D.   On: 05/19/2022 15:40    Procedures Procedures    Medications Ordered in ED Medications  LORazepam (ATIVAN) tablet 0.5 mg (0.5 mg Oral Given 05/19/22 1534)    ED Course/ Medical Decision Making/ A&P                             Medical Decision Making Amount and/or Complexity of Data Reviewed Labs: ordered. Radiology: ordered.  Risk Prescription drug management.   62 y/o female comes in with chief complaint of stroke concerns.  Patient last seen normal at 7:30 AM when she woke up.  She states that she felt fine initially, she took her morning meds and even showered.  She thereafter was listening to [place] podcast when she fell asleep.  Her husband then found her around 12:30 PM, sluggish, unsteady and that he decided to bring her into the ER.  Differential diagnosis considered for this patient includes medication side effects, toxic encephalopathy, metabolic encephalopathy, posterior stroke with depressed mentation and ataxia.  Patient has history of A-fib, PE therefore she does have risk for stroke.  She is on Coumadin.  She is not a candidate for TNK because she is on Coumadin and she is outside 6 hours since last seen normal.  I  ordered CT angiogram head and neck to ensure there is no posterior insufficiency, but patient has contrast allergy therefore the plan is to get CT scan of the brain along with MRI of the brain.  If the workup is negative, patient remains nonfocal and she  can be discharged.  6:54 PM I reviewed patient's CAT scan of the brain.  CT scan independently interpreted and there is no evidence of brain bleed. MRI results are negative for stroke.  Blood work is reassuring.  I discussed the finding with the patient and the husband.  Wife tells me in confidence that she thinks that she thinks that she is not depressed state from bipolar disorder.  She is sad.  She does not have SI, but she does think that her husband is frustrated and sometimes she feels that she is in his way.  She has a psychiatrist.  She has an appointment coming up this week.  I discussed with her that it is best for someone like her, who is not in crisis to continue discussing the next best step in her care with the psychiatrist and a therapist.  Patient also agrees with that.  Later on, I discussed the results with the husband.  He too was inquiring about patient's wellbeing.  Although he is well meaning and trying his best to help his wife of several years, there is probably an opportunity for him to align and engage better.  I have requested the patient to consult with her psychiatrist and/or therapist to see if the husband can participate in the next appointment.  Patient states that she will absolutely consider that.  They are stable for discharge.  [facility] resource information has been provided in case patient is decompensating but not in crisis mode.  Final Clinical Impression(s) / ED Diagnoses Final diagnoses:  Generalized weakness  Bipolar 1 disorder [facility])    Rx / DC Orders ED Discharge Orders     None         Varney Biles, MD 05/19/22 1858

## 2022-05-19 NOTE — ED Notes (Signed)
Patient transported to MRI 

## 2022-05-19 NOTE — ED Notes (Signed)
Patient transported to CT 

## 2022-05-19 NOTE — ED Triage Notes (Signed)
Pt c/o memory loss, intermittent "for years", but worse today; husband states that pt has increased lethargy this week; states today pt "seems off"; states having difficulty performing ADLs; husband endorses pt has history of overmedicating; endorses difficulty standing, dizziness, unsteady on feet; pt also c/o mild pain to back of head; LKW 0730 this am

## 2022-05-20 ENCOUNTER — Other Ambulatory Visit: Payer: Self-pay

## 2022-05-20 DIAGNOSIS — F310 Bipolar disorder, current episode hypomanic: Secondary | ICD-10-CM

## 2022-05-20 MED ORDER — QUETIAPINE FUMARATE 150 MG PO TABS: 150.0000 mg | ORAL_TABLET | Freq: Every day | ORAL | 0 refills | Status: DC

## 2022-05-21 ENCOUNTER — Telehealth: Payer: Self-pay

## 2022-05-21 NOTE — Telephone Encounter (Addendum)
Explained to the patient that I will send a copy of her clearance letter and advised the patient to contact the [facility] and have them send her records to [facility]. Patient agreeable and voiced understanding.   Left message for [facility] requesting they contact our office and leave their fax number so we can send over clearance.

## 2022-05-21 NOTE — Telephone Encounter (Signed)
Patient is following up to getting clearance to have dental work.  Patient states she changed dentists from [facility] - phone# [phone] to [place], phone# [phone].

## 2022-05-21 NOTE — Telephone Encounter (Signed)
Spoke with the patient and answered her questions about her ablation. Patient verbalized understanding.

## 2022-05-21 NOTE — Telephone Encounter (Signed)
Contacted the patient about pre-op and patient states she has questions about the ablation. She wants to know how long will she be down and a couple of other things.

## 2022-05-22 NOTE — Telephone Encounter (Signed)
I s/w [facility] and asked for their office to fax over a clearance from their office now. See previous notes, looks to be that the pt is choosing to have dental work done now with [facility].   I explained that we cannot send over the clearance that we had given for another office.   I gave fax # [phone] or [phone]

## 2022-05-25 DIAGNOSIS — D6869 Other thrombophilia: Secondary | ICD-10-CM | POA: Diagnosis not present

## 2022-05-25 DIAGNOSIS — G20C Parkinsonism, unspecified: Secondary | ICD-10-CM | POA: Diagnosis not present

## 2022-05-25 DIAGNOSIS — F319 Bipolar disorder, unspecified: Secondary | ICD-10-CM | POA: Diagnosis not present

## 2022-05-25 DIAGNOSIS — I480 Paroxysmal atrial fibrillation: Secondary | ICD-10-CM | POA: Diagnosis not present

## 2022-05-25 DIAGNOSIS — Z7901 Long term (current) use of anticoagulants: Secondary | ICD-10-CM | POA: Diagnosis not present

## 2022-05-25 DIAGNOSIS — E669 Obesity, unspecified: Secondary | ICD-10-CM | POA: Diagnosis not present

## 2022-05-25 DIAGNOSIS — J069 Acute upper respiratory infection, unspecified: Secondary | ICD-10-CM | POA: Diagnosis not present

## 2022-05-25 DIAGNOSIS — F411 Generalized anxiety disorder: Secondary | ICD-10-CM | POA: Diagnosis not present

## 2022-05-25 DIAGNOSIS — K047 Periapical abscess without sinus: Secondary | ICD-10-CM | POA: Diagnosis not present

## 2022-05-29 ENCOUNTER — Ambulatory Visit (INDEPENDENT_AMBULATORY_CARE_PROVIDER_SITE_OTHER): Payer: Medicare PPO | Admitting: Psychiatry

## 2022-05-29 DIAGNOSIS — F310 Bipolar disorder, current episode hypomanic: Secondary | ICD-10-CM

## 2022-05-29 NOTE — Progress Notes (Signed)
[facility] Counselor/Therapist Progress Note  Patient ID: Emily Rice, MRN: [id number],    Date: 05/29/2022  Time Spent: 55 minutes  Treatment Type: Individual Therapy  Reported Symptoms: anxiety, some depression, some difficulty with memory and following the conversation at times; patient states she is having some memory issues and "it's getting worse"  Mental Status Exam:  Appearance:   Casual     Behavior:  Appropriate, Sharing, and Motivated  Motor:  Normal  Speech/Language:   Clear and Coherent  Affect:  anxious  Mood:  Anxious, some depression  Thought process:  Having some memory issues  Thought content:    Rumination and some obsessive thoughts  Sensory/Perceptual disturbances:    WNL  Orientation:  oriented to stated date of March "17, 19th, 20" and correct answer is 19th.  Attention:  Fair  Concentration:  Fair  Memory:  Having some short term memory issues and she stated it's worsening  Fund of knowledge:   Fair  Insight:    Fair  Judgment:   Good and Fair  Impulse Control:  Good   Risk Assessment: Danger to Self:  No Self-injurious Behavior: No Danger to Others: No Duty to Warn:no Physical Aggression / Violence:No  Access to Firearms a concern: No  Gang Involvement:No   Subjective:  Patient in today and seemed scattered and having some memory issues which she described as "they are getting worse".  Does not know for sure if she has had prior memory testing but thought she had at [facility].  With her permission I reached out to husband and he confirmed that she had some "a while back, not sure of date" and that they have a copy of the report at home which he will provide [place] with a copy and patient is in agreement with that.  Today showing anxiety, some depression, and as noted already difficulty with memory and following in a conversation at times.  Reports some "ups and downs" in her mood "previously but not today".  She seemed more confused today and  kept scrolling through a calendar on her phone but not sure what she was looking for.  Several times mentioned that "I think my memory is getting worse".  Reports she is getting transportation through [facility] and that is how she got to her appointment today.  Describes some contact with neighbors which is good for her.  Did ask patient about the possibility of her husband coming in for part of her next appointment to be able to share some information that might be helpful to me and helping patient, and patient was in agreement with this.  Patient seem to have much more difficulty following a conversation today.  Stated that she was not on any new medication nor has she missed any medicines that she was supposed to be taking.  Less sadness noticed today, less anger and did not seem as depressed overall.  Hard for patient to follow a train of thought.  Did not show much affect in any direction except some confusion at times when she was trying to look up things on her phone.  Is not driving "due to medical reasons".  Before leaving, did mention her memory issues again and also agreed again that it was fine for her husband to come in for part of her next session to discuss prior memory testing and bring a copy of testing results.  Today patient did not mention her parkinsonian symptoms   Interventions: Solution-Oriented/Positive Psychology  and Ego-Supportive  Goals: 1.  Enhance her ability to handle effectively the full variety of life's anxieties as much as she is able. 2.  Increase understanding of beliefs and messages that produce her symptoms of worry and anxiety and be able to decrease her symptoms. 3.  Identify and use specific coping strategies for anxiety reduction.  Diagnosis:   ICD-10-CM   1. Bipolar affective disorder, current episode hypomanic ([place])  F31.0       Plan: Patient in today and had more difficulty focusing in session, and seem to recognize this herself.  Discussed some memory issues  that she feels "are getting worse".  Patient did allow me to speak with husband by phone before she left today and he plans to come in for a few minutes during the session to help share his insight on patient so that I can get a better feel for how I can best help her especially if there are changes going on.  Patient making efforts to make progress and at this point to continue with goal-directed behaviors that support patient's ability in managing her anxiety and depression(which she reports has decreased). Encouraged patient in more positive and self affirming behaviors as noted in session including: Having more interpersonal contacts with others as possible, continuing her involvement at the gym where she enjoys the physical and interactional aspects, be around other people more often, think of ways that she might be able to connect more with people outside the home and feel comfortable perhaps in smaller environments including her neighborhood or church family.  Goal review and progress/challenges noted with patient.  Next appointment within 6 weeks.  This record has been created using [employer].  Chart creation errors have been sought, but may not always have been located and corrected.  Such creation errors do not reflect on the standard of medical care provided.   Shanon Ace, LCSW

## 2022-06-01 ENCOUNTER — Other Ambulatory Visit: Payer: Medicaid Other

## 2022-06-04 ENCOUNTER — Other Ambulatory Visit (HOSPITAL_COMMUNITY): Payer: Medicaid Other

## 2022-06-05 ENCOUNTER — Telehealth: Payer: Self-pay | Admitting: Cardiology

## 2022-06-05 ENCOUNTER — Telehealth: Payer: Self-pay

## 2022-06-05 DIAGNOSIS — R030 Elevated blood-pressure reading, without diagnosis of hypertension: Secondary | ICD-10-CM | POA: Diagnosis not present

## 2022-06-05 DIAGNOSIS — Z6827 Body mass index (BMI) 27.0-27.9, adult: Secondary | ICD-10-CM | POA: Diagnosis not present

## 2022-06-05 DIAGNOSIS — R051 Acute cough: Secondary | ICD-10-CM | POA: Diagnosis not present

## 2022-06-05 NOTE — Telephone Encounter (Signed)
Patient says she is feeling very fatigued wanted to know if it could be her meds?

## 2022-06-05 NOTE — Telephone Encounter (Signed)
Pt c/o of Chest Pain: STAT if CP now or developed within 24 hours  1. Are you having CP right now?   Yes  2. Are you experiencing any other symptoms (ex. SOB, nausea, vomiting, sweating)?   SOB since last year  3. How long have you been experiencing CP? Since she woke up today  4. Is your CP continuous or coming and going?   Continuous  5. Have you taken Nitroglycerin?   No   Patient stated for the past 1-2 months she has not been able to wake up early like usual and is having excess sleepiness.  Patient stated that today she was unable to get up until 12:30 pm.  Patient stated she is also having chest heaviness in the middle of her chest.

## 2022-06-05 NOTE — Telephone Encounter (Signed)
Pt was calling in with complaints of chest pressure since about 2 am this morning.  She reports that she was awoken by chest pressure and sob at 2 am this morning and this has been continuous  all day.  She states she has no energy and feels very lethargic.  Pt does sound very lethargic and has slurred speech with talking with her over the phone.  She denies any N/V, diaphoresis, pre-syncope or syncope.  She states she feels like her heart rhythm is irregular.  She does have a history of DVT and PAF, and will be having afib ablation in a couple months.  She states she placed a call about this to her General Cards Dr. Dema Severin, our office, and her Psychiatrist about this.  She does not have the capability to take her HR/BP at home.  She is home alone.   While conversing with her over the phone to obtain further information, noted she kept slurring her speech and then she would fall asleep then resume back talking to me.   Tried asking her about what medications she has taken today and she kept saying "I'm not able to cook or fold the laundry because I am so tired."  Then she would fall asleep again.  Words continued to slur.  Advised the pt that we need to call 911 now for concerning symptoms, lethargy, and inability to stay awake on the phone.  She said she would call then fell asleep again.  I did say I am sending EMS out her way, then called 911 myself.   With that, I placed a call to 911 myself and asked that they report to the pts residence now, as an emergent call, for it is unclear with my phone assessment if she is having a life threatening emergency and/or cardiac emergency at this time. Did inform them of pts cardiac history and that she is also on several medications that can cause lethargy and/or altered mental status.    911 operator states they are sending responders and EMS out to her now for further evaluation and will arrive here in the next few mins.  They are aware that she is home  alone.   Will send this message to pts EP MD Dr. Quentin Ore and RN, to make them aware of this event.

## 2022-06-06 NOTE — Telephone Encounter (Signed)
Left message for patient to call back  

## 2022-06-06 NOTE — Telephone Encounter (Signed)
Spoke with the patient's husband who states that the patient is doing good today. She is a little sleepy but has been up and cooked food and went to get her hair done today. To his knowledge EMS did not come and evaluate the patient yesterday.

## 2022-06-06 NOTE — Telephone Encounter (Signed)
Its quite non specific.  Please have her follow-up with PCP.   Ferron Ishmael Tower, DO, [facility]

## 2022-06-07 ENCOUNTER — Ambulatory Visit (HOSPITAL_COMMUNITY): Payer: Medicare PPO | Admitting: Physician Assistant

## 2022-06-12 ENCOUNTER — Encounter: Payer: Self-pay | Admitting: Adult Health

## 2022-06-12 ENCOUNTER — Ambulatory Visit (INDEPENDENT_AMBULATORY_CARE_PROVIDER_SITE_OTHER): Payer: Medicare PPO | Admitting: Adult Health

## 2022-06-12 DIAGNOSIS — F422 Mixed obsessional thoughts and acts: Secondary | ICD-10-CM | POA: Diagnosis not present

## 2022-06-12 DIAGNOSIS — F09 Unspecified mental disorder due to known physiological condition: Secondary | ICD-10-CM

## 2022-06-12 DIAGNOSIS — F411 Generalized anxiety disorder: Secondary | ICD-10-CM | POA: Diagnosis not present

## 2022-06-12 DIAGNOSIS — F310 Bipolar disorder, current episode hypomanic: Secondary | ICD-10-CM | POA: Diagnosis not present

## 2022-06-12 DIAGNOSIS — G4700 Insomnia, unspecified: Secondary | ICD-10-CM

## 2022-06-12 DIAGNOSIS — F4310 Post-traumatic stress disorder, unspecified: Secondary | ICD-10-CM | POA: Diagnosis not present
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
## 2022-06-13 ENCOUNTER — Telehealth: Payer: Self-pay

## 2022-06-14 NOTE — Telephone Encounter (Signed)
She has quetiapine 100 mg and quetiapine fumarate 150 mg on her med list.

## 2022-06-14 NOTE — Telephone Encounter (Signed)
See message from patient.  She just saw you 2 days ago. Any advice?

## 2022-06-14 NOTE — Telephone Encounter (Signed)
Pt lvm that she is having problems with increased anxiety and depression. She would like to know what to do. Please call her back at [phone]

## 2022-06-14 NOTE — Telephone Encounter (Signed)
She can increase the Seroquel to 150mg  at hs.

## 2022-06-15 NOTE — Telephone Encounter (Signed)
Pt LVM @ 9:42a.  She said she has questions about her medicine and about a referral to a neurologist.  Next appt 5/14

## 2022-06-15 NOTE — Telephone Encounter (Signed)
Correct. We increased the 100mg  to 150mg  - is she taking the 150mg  dose?

## 2022-06-15 NOTE — Telephone Encounter (Signed)
We have sent neurology referral.

## 2022-06-18 ENCOUNTER — Telehealth: Payer: Self-pay | Admitting: Adult Health

## 2022-06-18 NOTE — Telephone Encounter (Signed)
LM for Emily Rice for call [facility]. Because she is already a patient there for cognitive issues, she just needs to call them for follow up and mention that she has seen a decline in her cognitive ability.

## 2022-06-19 ENCOUNTER — Telehealth: Payer: Self-pay | Admitting: Adult Health

## 2022-06-19 NOTE — Telephone Encounter (Signed)
Pt called at 1:37p.  She said she called [facility].  They don't have any openings until the end of June.  She doesn't want to wait that long for the issues she's having.  She's asking for a referral to another neurologist and she can request that the records get sent there.  Next appt 5/14

## 2022-06-20 NOTE — Telephone Encounter (Signed)
Pt called at 2:38 stating and requesting the same info in previous message.

## 2022-06-20 NOTE — Telephone Encounter (Signed)
Called patient and told her she should stay with [facility]. I told her she could ask to be put on a cancellation list and she said she would call them to do that.

## 2022-06-25 ENCOUNTER — Telehealth: Payer: Self-pay | Admitting: Cardiology

## 2022-06-25 NOTE — Telephone Encounter (Signed)
Patient stated she wants to reschedule procedure on 7/1 as she will be out of town.

## 2022-06-28 NOTE — Telephone Encounter (Signed)
Spoke with patient to advise that Dr Lovena Neighbours nurse is out of the office this week and will follow up with her when she returns.

## 2022-06-28 NOTE — Telephone Encounter (Signed)
Patient is calling back to get update on rescheduling upcoming procedure.

## 2022-07-02 NOTE — Telephone Encounter (Signed)
Left message for patient to call back  

## 2022-07-03 NOTE — Telephone Encounter (Signed)
Patient is returning call. Please advise? 

## 2022-07-03 NOTE — Telephone Encounter (Signed)
Spoke with the patient who states that she would like to reschedule her ablation. She has been rescheduled for 8/6. Labs rescheduled for 7/16. Will send message to scheduling to rearrange cardiac CT and follow up appointments.

## 2022-07-06 NOTE — Telephone Encounter (Signed)
Patient is calling to discuss moving ablation date back to the original time in July. Requesting call back to see what she would need to do to make that possible. Please advise.

## 2022-07-06 NOTE — Telephone Encounter (Signed)
Spoke with the patient who states that she has decided to keep her August date for her procedure

## 2022-07-10 DIAGNOSIS — D573 Sickle-cell trait: Secondary | ICD-10-CM | POA: Diagnosis not present

## 2022-07-10 DIAGNOSIS — R7301 Impaired fasting glucose: Secondary | ICD-10-CM | POA: Diagnosis not present

## 2022-07-10 DIAGNOSIS — Z86711 Personal history of pulmonary embolism: Secondary | ICD-10-CM | POA: Diagnosis not present

## 2022-07-10 DIAGNOSIS — Z7901 Long term (current) use of anticoagulants: Secondary | ICD-10-CM | POA: Diagnosis not present

## 2022-07-10 DIAGNOSIS — I480 Paroxysmal atrial fibrillation: Secondary | ICD-10-CM | POA: Diagnosis not present

## 2022-07-12 ENCOUNTER — Telehealth: Payer: Self-pay | Admitting: Neurology

## 2022-07-12 ENCOUNTER — Encounter: Payer: Self-pay | Admitting: Neurology

## 2022-07-12 ENCOUNTER — Ambulatory Visit (INDEPENDENT_AMBULATORY_CARE_PROVIDER_SITE_OTHER): Payer: Medicare PPO | Admitting: Neurology

## 2022-07-12 VITALS — BP 144/80 | HR 60 | Ht 63.0 in | Wt 175.0 lb

## 2022-07-12 DIAGNOSIS — R413 Other amnesia: Secondary | ICD-10-CM

## 2022-07-12 DIAGNOSIS — F3160 Bipolar disorder, current episode mixed, unspecified: Secondary | ICD-10-CM

## 2022-07-12 NOTE — Telephone Encounter (Signed)
Sent referral to [facility]: Phone [phone]  Fax: [phone]
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
## 2022-07-13 LAB — TSH: TSH: 0.973 u[IU]/mL (ref 0.450–4.500)

## 2022-07-13 LAB — C-REACTIVE PROTEIN: CRP: 4 mg/L (ref 0–10)

## 2022-07-13 LAB — CK: Total CK: 3278 U/L (ref 32–182)

## 2022-07-13 LAB — ANA W/REFLEX IF POSITIVE: Anti Nuclear Antibody (ANA): NEGATIVE

## 2022-07-13 LAB — SEDIMENTATION RATE: Sed Rate: 17 mm/hr (ref 0–40)

## 2022-07-16 ENCOUNTER — Ambulatory Visit (INDEPENDENT_AMBULATORY_CARE_PROVIDER_SITE_OTHER): Payer: Medicare PPO | Admitting: Psychiatry

## 2022-07-16 DIAGNOSIS — F310 Bipolar disorder, current episode hypomanic: Secondary | ICD-10-CM | POA: Diagnosis not present

## 2022-07-16 NOTE — Telephone Encounter (Signed)
[facility] sent back a fax that they can't take this patient because of their medicaid.  The referral had Altabet, Salvatore Decent, PhD on it anyways, so I sent it to [facility].

## 2022-07-16 NOTE — Progress Notes (Signed)
[facility] Counselor/Therapist Progress Note  Patient ID: Emily Rice, MRN: [id number],    Date: 07/16/2022  Time Spent: 50 minutes   Treatment Type: Individual Therapy  Reported Symptoms: anxiety, depression, difficulty focusing, angry, hurt, frustrated, irritable, difficulty with memory issues continuing and "getting worse"   Mental Status Exam:  Appearance:   Casual     Behavior:  Appropriate, Sharing, and Motivated  Motor:  Normal  Speech/Language:   Clear and Coherent  Affect:  Anxious, some depression  Mood:  anxious and depressed  Thought process:  Some tangentiality  Thought content:    Rumination and some tangentiality  Sensory/Perceptual disturbances:    WNL  Orientation:  oriented to person, place, time/date, situation, day of week, month of year, year, and stated date of Jul 16, 2022  Attention:  Fair  Concentration:  Fair  Memory:  Some impairment in her memory "as it's getting worse."  Fund of knowledge:   Fair  Insight:    Fair  Judgment:   Good and Fair  Impulse Control:  Fair   Risk Assessment: Danger to Self:  No Self-injurious Behavior: No Danger to Others: No Duty to Warn:no Physical Aggression / Violence:No  Access to Firearms a concern: No  Gang Involvement:No   Subjective:  Patient in today reporting anxiety, irritability, depression, difficulty focusing, hurt, frustrated, angry, and difficulty with worsening memory issues. Mentions several times that her memory was getting worse. States she has had a recent appt and other appts coming up to evaluate "whether I have Alzheimers , Dementia, and Parkinsons." Possible Parkinsonian symptoms. Reports she has kept up some limited contact with neighbors as she and husband walk the neighborhood. Patient hard to follow a train of thought. Less sadness today, less anger. Not currently driving because of med/memory issues. Husband not with patient today as mentioned as possibility last session. Talked  through some of her anxiety today of "not knowing exactly next steps" but states she is to be evaluated re: memory issues.  Interventions: Cognitive Behavioral Therapy, Solution-Oriented/Positive Psychology, and Ego-Supportive  Goals: 1.  Enhance her ability to handle effectively the full variety of life's anxieties as much as she is able. 2.  Increase understanding of beliefs and messages that produce her symptoms of worry and anxiety and be able to decrease her symptoms. 3.  Identify and use specific coping strategies for anxiety reduction.   Diagnosis:   ICD-10-CM   1. Bipolar affective disorder, current episode hypomanic (HCC)  F31.0      Plan:  Patient in today showing more difficulty and focusing and she reports further memory concerns.  States that her memory is "getting worse" and that she is to see another doctor about her memory issues for some further testing but did not have exact information.  States that her husband has that information.  We are holding off and rescheduling her right now at this office till we learn more about patient's referral for testing and whether or not she will still be coming to our office.  Will follow-up with patient once we know more information. Encouraged patient and her practice of more self affirming and positive behaviors as noted in session including: Continuing her involvement at the gym where she enjoys the physical and interactional aspects, have more interpersonal contacts outside of that environment as well, be intentional and being around more people more often, think of ways that she might be able to connect with people outside the home and feel comfortable  in smaller environments including her neighborhood or church family.  Goal review and progress/challenges noted with patient.  Next appointment within 5 to 6 weeks.   Mathis Fare, LCSW

## 2022-07-17 ENCOUNTER — Telehealth: Payer: Self-pay | Admitting: Neurology

## 2022-07-17 DIAGNOSIS — R748 Abnormal levels of other serum enzymes: Secondary | ICD-10-CM | POA: Insufficient documentation

## 2022-07-17 NOTE — Telephone Encounter (Signed)
I failed to reach patient, left message for her to call back.  Please call patient  Laboratory evaluation showed  Significantly elevated CPK, 3278, which might explain her diffuse body achy pain,  I have entered repeat laboratory evaluation, she may, without appointment to have repeat labs,  Please advise her to drink plenty water, watch the color of her urine,

## 2022-07-18 ENCOUNTER — Other Ambulatory Visit: Payer: Self-pay | Admitting: Adult Health

## 2022-07-18 ENCOUNTER — Telehealth: Payer: Self-pay | Admitting: Adult Health

## 2022-07-18 DIAGNOSIS — F310 Bipolar disorder, current episode hypomanic: Secondary | ICD-10-CM

## 2022-07-18 MED ORDER — ALPRAZOLAM 1 MG PO TABS: ORAL_TABLET | ORAL | 0 refills | Status: DC

## 2022-07-18 NOTE — Telephone Encounter (Signed)
The patient left me a voice mail that she tried to schedule at [facility] and they told her they do not take her [facility] PPO plan and asked to be sent somewhere else. Trudie Reed will you send the memory referral somewhere else please? Thanks!

## 2022-07-18 NOTE — Telephone Encounter (Signed)
Patient requested refill for Alprazolam 1mg . Ph: [phone] Appt 5/14 Pharmacy [facility] [address] [place] [facility]

## 2022-07-18 NOTE — Telephone Encounter (Signed)
Script sent  

## 2022-07-18 NOTE — Telephone Encounter (Signed)
Called and spoke to patient about results, patient states she will be out of town for the rest of the week and will come in next week to get labs redrawn

## 2022-07-18 NOTE — Telephone Encounter (Signed)
It looks like Dr. Mariane Masters office is going to reach out to see the patient for bipolar disorder and I will tell the patient to call [facility] back and let them know she no longer has medicaid.

## 2022-07-19 ENCOUNTER — Other Ambulatory Visit: Payer: Self-pay

## 2022-07-19 DIAGNOSIS — F310 Bipolar disorder, current episode hypomanic: Secondary | ICD-10-CM

## 2022-07-19 MED ORDER — ALPRAZOLAM 1 MG PO TABS: ORAL_TABLET | ORAL | 0 refills | Status: DC

## 2022-07-19 NOTE — Telephone Encounter (Signed)
Pended.

## 2022-07-19 NOTE — Telephone Encounter (Signed)
Can you pend please

## 2022-07-19 NOTE — Telephone Encounter (Signed)
Pt called at 1:06.  Medicine was sent to [facility] instead of [facility] as referenced in the encounter.  Pls send there instead.

## 2022-07-23 ENCOUNTER — Telehealth: Payer: Self-pay | Admitting: Cardiology

## 2022-07-23 NOTE — Telephone Encounter (Signed)
Pt would like a callback regarding lab orders. Please advise 

## 2022-07-23 NOTE — Telephone Encounter (Signed)
Left message for patient to call back  

## 2022-07-23 NOTE — Telephone Encounter (Signed)
Follow Up:     Patient was returning Carlyle's call from today(07-23-22).

## 2022-07-24 ENCOUNTER — Other Ambulatory Visit (INDEPENDENT_AMBULATORY_CARE_PROVIDER_SITE_OTHER): Payer: Self-pay

## 2022-07-24 ENCOUNTER — Telehealth: Payer: Self-pay | Admitting: Neurology

## 2022-07-24 ENCOUNTER — Ambulatory Visit (INDEPENDENT_AMBULATORY_CARE_PROVIDER_SITE_OTHER): Payer: Self-pay | Admitting: Adult Health

## 2022-07-24 DIAGNOSIS — R748 Abnormal levels of other serum enzymes: Secondary | ICD-10-CM

## 2022-07-24 DIAGNOSIS — Z0289 Encounter for other administrative examinations: Secondary | ICD-10-CM

## 2022-07-24 DIAGNOSIS — Z0389 Encounter for observation for other suspected diseases and conditions ruled out: Secondary | ICD-10-CM

## 2022-07-24 NOTE — Telephone Encounter (Signed)
Referral faxed to Dr. Minus Liberty Office: Phone: [phone]   Fax: [phone]

## 2022-07-24 NOTE — Telephone Encounter (Signed)
Left message for patient to call back  

## 2022-07-24 NOTE — Telephone Encounter (Signed)
I faxed a referral to Dr. Minus Liberty office for memory issues.

## 2022-07-24 NOTE — Addendum Note (Signed)
Addended by: Deatra James on: 07/24/2022 09:02 AM   Modules accepted: Orders

## 2022-07-24 NOTE — Progress Notes (Signed)
Patient no show appointment. ? ?

## 2022-07-24 NOTE — Telephone Encounter (Signed)
Patient is returning call. Requesting return call.  

## 2022-07-24 NOTE — Telephone Encounter (Signed)
Patient returned RN's call. 

## 2022-07-25 NOTE — Telephone Encounter (Signed)
Left message for patient to call back  

## 2022-07-25 NOTE — Telephone Encounter (Signed)
Follow Up:     Patientis returning a call from this morning. 

## 2022-07-25 NOTE — Telephone Encounter (Signed)
Left message for patient to call back. Advised she can also send a MyChart message with any questions.

## 2022-07-26 ENCOUNTER — Telehealth: Payer: Self-pay | Admitting: Neurology

## 2022-07-26 DIAGNOSIS — D6869 Other thrombophilia: Secondary | ICD-10-CM | POA: Diagnosis not present

## 2022-07-26 DIAGNOSIS — D573 Sickle-cell trait: Secondary | ICD-10-CM | POA: Diagnosis not present

## 2022-07-26 DIAGNOSIS — M791 Myalgia, unspecified site: Secondary | ICD-10-CM | POA: Diagnosis not present

## 2022-07-26 DIAGNOSIS — R748 Abnormal levels of other serum enzymes: Secondary | ICD-10-CM | POA: Diagnosis not present

## 2022-07-26 DIAGNOSIS — I480 Paroxysmal atrial fibrillation: Secondary | ICD-10-CM | POA: Diagnosis not present

## 2022-07-26 DIAGNOSIS — F319 Bipolar disorder, unspecified: Secondary | ICD-10-CM | POA: Diagnosis not present

## 2022-07-26 DIAGNOSIS — G20A1 Parkinson's disease without dyskinesia, without mention of fluctuations: Secondary | ICD-10-CM | POA: Diagnosis not present

## 2022-07-26 DIAGNOSIS — Z0000 Encounter for general adult medical examination without abnormal findings: Secondary | ICD-10-CM | POA: Diagnosis not present

## 2022-07-26 DIAGNOSIS — E669 Obesity, unspecified: Secondary | ICD-10-CM | POA: Diagnosis not present

## 2022-07-26 LAB — COMPREHENSIVE METABOLIC PANEL
ALT: 17 IU/L (ref 0–32)
AST: 27 IU/L (ref 0–40)
Albumin/Globulin Ratio: 1.8 (ref 1.2–2.2)
Albumin: 4.7 g/dL (ref 3.8–4.9)
Alkaline Phosphatase: 102 IU/L (ref 44–121)
BUN/Creatinine Ratio: 11 (ref 9–23)
BUN: 12 mg/dL (ref 6–24)
Bilirubin Total: 0.3 mg/dL (ref 0.0–1.2)
CO2: 24 mmol/L (ref 20–29)
Calcium: 9.4 mg/dL (ref 8.7–10.2)
Chloride: 103 mmol/L (ref 96–106)
Creatinine, Ser: 1.12 mg/dL — ABNORMAL HIGH (ref 0.57–1.00)
Globulin, Total: 2.6 g/dL (ref 1.5–4.5)
Glucose: 86 mg/dL (ref 70–99)
Potassium: 4.4 mmol/L (ref 3.5–5.2)
Sodium: 142 mmol/L (ref 134–144)
Total Protein: 7.3 g/dL (ref 6.0–8.5)
eGFR: 57 mL/min/{1.73_m2} — ABNORMAL LOW (ref >59)

## 2022-07-26 LAB — CK: Total CK: 366 U/L — ABNORMAL HIGH (ref 32–182)

## 2022-07-26 LAB — ALDOLASE: Aldolase: 13 U/L — ABNORMAL HIGH (ref 3.3–10.3)

## 2022-07-26 NOTE — Telephone Encounter (Signed)
Pt called requesting to speak to the RN regarding her referral to Dr. Roseanne Reno.

## 2022-07-26 NOTE — Telephone Encounter (Signed)
From review record refer to Dr. Mauri Pole was send on May 2nd.  Laboratory evaluation since May 2024:  Normal TSH, ESR C-reactive protein,  --Elevated CPK 3278 on Jul 12, 2022, repeat level was 366, this is was about her baseline, elevated CPK can be related to physical exertion, dehydration, sometimes medication side effect,  --- Slight elevation of creatinine indicating mild abnormal kidney function she would benefit increase water intake

## 2022-07-26 NOTE — Telephone Encounter (Signed)
Called patient and Gave information about referral, provided phone number for Dr. Roseanne Reno and patient is also requesting results for lab work. Will route to Dr. Terrace Arabia for review

## 2022-07-27 ENCOUNTER — Other Ambulatory Visit: Payer: Self-pay | Admitting: Adult Health

## 2022-07-27 ENCOUNTER — Ambulatory Visit (INDEPENDENT_AMBULATORY_CARE_PROVIDER_SITE_OTHER): Payer: Medicare PPO | Admitting: Adult Health

## 2022-07-27 ENCOUNTER — Encounter: Payer: Self-pay | Admitting: Adult Health

## 2022-07-27 DIAGNOSIS — F411 Generalized anxiety disorder: Secondary | ICD-10-CM | POA: Diagnosis not present

## 2022-07-27 DIAGNOSIS — F4310 Post-traumatic stress disorder, unspecified: Secondary | ICD-10-CM

## 2022-07-27 DIAGNOSIS — F310 Bipolar disorder, current episode hypomanic: Secondary | ICD-10-CM

## 2022-07-27 DIAGNOSIS — F09 Unspecified mental disorder due to known physiological condition: Secondary | ICD-10-CM

## 2022-07-27 MED ORDER — ALPRAZOLAM 1 MG PO TABS: ORAL_TABLET | ORAL | 0 refills | Status: DC

## 2022-07-27 MED ORDER — LAMOTRIGINE 200 MG PO TABS: 200.0000 mg | ORAL_TABLET | Freq: Two times a day (BID) | ORAL | 2 refills | Status: DC

## 2022-07-27 MED ORDER — LUMATEPERONE TOSYLATE 42 MG PO CAPS: 42.0000 mg | ORAL_CAPSULE | Freq: Every day | ORAL | 0 refills | Status: DC

## 2022-07-27 MED ORDER — QUETIAPINE FUMARATE 100 MG PO TABS: 100.0000 mg | ORAL_TABLET | Freq: Every day | ORAL | 2 refills | Status: DC

## 2022-07-27 MED ORDER — DIAZEPAM 10 MG PO TABS: 10.0000 mg | ORAL_TABLET | Freq: Two times a day (BID) | ORAL | 0 refills | Status: DC

## 2022-07-27 MED ORDER — ESCITALOPRAM OXALATE 10 MG PO TABS: 10.0000 mg | ORAL_TABLET | Freq: Every day | ORAL | 2 refills | Status: DC
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
## 2022-07-30 NOTE — Telephone Encounter (Signed)
Spoke with the patient who is currently scheduled for an AFIB ablation with Dr. Lalla Brothers on 8/6. She would like to know if this can be moved sooner. I have added patient to Dr. Lovena Neighbours wait list. She is aware that we will call her if there are any cancellations.

## 2022-07-30 NOTE — Telephone Encounter (Signed)
Follow Up:    Patient is returning Emily Rice's call from 07-25-22.

## 2022-08-01 ENCOUNTER — Telehealth: Payer: Self-pay | Admitting: Cardiology

## 2022-08-01 NOTE — Telephone Encounter (Signed)
Spoke with the patient about her ablation. Advised that she is on the wait list and we will let her know if someone cancels. Reviewed lab work and CT dates. Patient verbalized understanding.

## 2022-08-01 NOTE — Telephone Encounter (Signed)
New Message:     Patient says she would like to talk to [facility] about her upcoming Ablation.

## 2022-08-02 ENCOUNTER — Other Ambulatory Visit: Payer: Self-pay | Admitting: Adult Health

## 2022-08-02 DIAGNOSIS — F310 Bipolar disorder, current episode hypomanic: Secondary | ICD-10-CM

## 2022-08-03 ENCOUNTER — Telehealth: Payer: Self-pay | Admitting: Adult Health

## 2022-08-03 DIAGNOSIS — F310 Bipolar disorder, current episode hypomanic: Secondary | ICD-10-CM

## 2022-08-03 LAB — DRUG SCREEN 10 W/CONF, SERUM
Amphetamines, IA: NEGATIVE ng/mL
Barbiturates, IA: NEGATIVE ug/mL
Benzodiazepines, IA: POSITIVE ng/mL — AB
Cocaine & Metabolite, IA: NEGATIVE ng/mL
Methadone, IA: NEGATIVE ng/mL
Opiates, IA: NEGATIVE ng/mL
Oxycodones, IA: NEGATIVE ng/mL
Phencyclidine, IA: NEGATIVE ng/mL
Propoxyphene, IA: NEGATIVE ng/mL
THC(Marijuana) Metabolite, IA: NEGATIVE ng/mL

## 2022-08-03 LAB — BENZODIAZEPINES,MS,WB/SP RFX
7-Aminoclonazepam: NEGATIVE ng/mL
Alprazolam: 112 ng/mL
Benzodiazepines Confirm: POSITIVE
Chlordiazepoxide: NEGATIVE
Clonazepam: NEGATIVE ng/mL
Desalkylflurazepam: NEGATIVE ng/mL
Desmethylchlordiazepoxide: NEGATIVE
Desmethyldiazepam: NEGATIVE ng/mL
Diazepam: NEGATIVE ng/mL
Flurazepam: NEGATIVE ng/mL
Lorazepam: NEGATIVE ng/mL
Midazolam: NEGATIVE ng/mL
Oxazepam: NEGATIVE ng/mL
Temazepam: NEGATIVE ng/mL
Triazolam: NEGATIVE ng/mL

## 2022-08-03 NOTE — Telephone Encounter (Signed)
Pt called and said that she would like all her medicines to be cancelled at local pharmacy and sent to [facility] mail order. She also would like her captyla 42 mg to be sent there as well. She is doing well on the samples. So the medicines are lamictal, valium,seroquel xanax and lexapro

## 2022-08-07 MED ORDER — ESCITALOPRAM OXALATE 10 MG PO TABS
10.0000 mg | ORAL_TABLET | Freq: Every day | ORAL | 0 refills | Status: DC
Start: 2022-08-07 — End: 2022-10-31

## 2022-08-07 MED ORDER — QUETIAPINE FUMARATE 100 MG PO TABS
100.0000 mg | ORAL_TABLET | Freq: Every day | ORAL | 0 refills | Status: DC
Start: 2022-08-07 — End: 2022-10-20

## 2022-08-07 MED ORDER — LAMOTRIGINE 200 MG PO TABS
200.0000 mg | ORAL_TABLET | Freq: Two times a day (BID) | ORAL | 0 refills | Status: DC
Start: 2022-08-07 — End: 2022-11-21

## 2022-08-07 MED ORDER — LUMATEPERONE TOSYLATE 42 MG PO CAPS
42.0000 mg | ORAL_CAPSULE | Freq: Every day | ORAL | 0 refills | Status: DC
Start: 2022-08-07 — End: 2022-08-16

## 2022-08-08 ENCOUNTER — Ambulatory Visit: Payer: Medicare PPO | Admitting: Cardiology

## 2022-08-08 NOTE — Telephone Encounter (Signed)
Diazepam LF 5/17 Alprazolam LF 4/1

## 2022-08-09 ENCOUNTER — Telehealth: Payer: Self-pay | Admitting: Neurology

## 2022-08-09 ENCOUNTER — Telehealth: Payer: Self-pay | Admitting: Cardiology

## 2022-08-09 NOTE — Telephone Encounter (Signed)
Call back to Lynn, message left to call office back.

## 2022-08-09 NOTE — Progress Notes (Signed)
This was resulted by Dr. Terrace Arabia

## 2022-08-09 NOTE — Telephone Encounter (Signed)
Emily Rice is calling from Dr. [name] office. Stated pt was unaware that a referral was going to be placed. Stated pt had a memory test just a year ago and insurance might not pay. She is requesting a call back from nurse to discuss.

## 2022-08-09 NOTE — Telephone Encounter (Signed)
Patient states that she is supposed to have lab work prior to her ablation. She says that Dr. Lalla Brothers told her 3 weeks, 2 weeks and then 1 week prior. I don't see anymore lab orders to schedule and she has an appt on 07/16 for a BMET, CBC and INR-CL lab draw. Please advise.

## 2022-08-09 NOTE — Telephone Encounter (Signed)
Call back from Ephraim at DR. Roseanne Reno. Reviewed last visit note and no mention of previous neurodiagnostic testing in October. Alcario Drought states she will fax report for Dr. Terrace Arabia to review and see if she still wants to proceed with testing.

## 2022-08-10 NOTE — Telephone Encounter (Signed)
Spoke with the patient and advised her that she will need 3 weekly INR checks prior to her ablation that are therapeutic. She will get this done with her PCP.

## 2022-08-13 DIAGNOSIS — M25561 Pain in right knee: Secondary | ICD-10-CM | POA: Diagnosis not present

## 2022-08-13 DIAGNOSIS — M25569 Pain in unspecified knee: Secondary | ICD-10-CM | POA: Diagnosis not present

## 2022-08-13 DIAGNOSIS — G8929 Other chronic pain: Secondary | ICD-10-CM | POA: Diagnosis not present

## 2022-08-13 DIAGNOSIS — Z86718 Personal history of other venous thrombosis and embolism: Secondary | ICD-10-CM | POA: Diagnosis not present

## 2022-08-13 DIAGNOSIS — R748 Abnormal levels of other serum enzymes: Secondary | ICD-10-CM | POA: Diagnosis not present

## 2022-08-13 DIAGNOSIS — M25562 Pain in left knee: Secondary | ICD-10-CM | POA: Diagnosis not present

## 2022-08-13 DIAGNOSIS — F319 Bipolar disorder, unspecified: Secondary | ICD-10-CM | POA: Diagnosis not present

## 2022-08-13 DIAGNOSIS — G20B2 Parkinson's disease with dyskinesia, with fluctuations: Secondary | ICD-10-CM | POA: Diagnosis not present

## 2022-08-13 DIAGNOSIS — Z7901 Long term (current) use of anticoagulants: Secondary | ICD-10-CM | POA: Diagnosis not present

## 2022-08-13 DIAGNOSIS — M791 Myalgia, unspecified site: Secondary | ICD-10-CM | POA: Diagnosis not present

## 2022-08-15 ENCOUNTER — Telehealth: Payer: Self-pay

## 2022-08-15 DIAGNOSIS — F411 Generalized anxiety disorder: Secondary | ICD-10-CM

## 2022-08-15 DIAGNOSIS — F310 Bipolar disorder, current episode hypomanic: Secondary | ICD-10-CM

## 2022-08-15 NOTE — Telephone Encounter (Signed)
Pended.

## 2022-08-15 NOTE — Telephone Encounter (Signed)
Will discuss.

## 2022-08-16 MED ORDER — LUMATEPERONE TOSYLATE 42 MG PO CAPS
42.0000 mg | ORAL_CAPSULE | Freq: Every day | ORAL | 0 refills | Status: DC
Start: 2022-08-16 — End: 2022-11-21

## 2022-08-16 NOTE — Telephone Encounter (Signed)
Diazepam LF 5/17, due 6/14

## 2022-08-16 NOTE — Telephone Encounter (Signed)
Patient called in for refill on Diazepam 10mg . States she is unsure if RM wants to prescribe a three month supply or a one month. If one month please send to [facility] [address] and for three months [facility] [address] [place]. She also inquired bout Caplyta RM gave her samples for medication and its working well she needs prescription for Caplyta sent to [facility] [address] for one month or for three month supply send to [facility]. Ph: [phone] Appt 6/19

## 2022-08-17 ENCOUNTER — Telehealth: Payer: Self-pay | Admitting: Adult Health

## 2022-08-17 NOTE — Telephone Encounter (Signed)
GM sent in an RX for Caplyta yesterday for 90 days to [facility]. It may need a PA. Can we look to start one?

## 2022-08-20 ENCOUNTER — Other Ambulatory Visit: Payer: Self-pay | Admitting: Cardiology

## 2022-08-20 DIAGNOSIS — I480 Paroxysmal atrial fibrillation: Secondary | ICD-10-CM

## 2022-08-20 IMAGING — CT CT ABD-PELV W/O CM
2 of 4 series · 17 of 46 positions shown, 19 images · non-contrast
Comparison: None.

CLINICAL DATA: Abdominal pain in the lower abdomen radiating to
back and legs.

EXAM:
CT ABDOMEN AND PELVIS WITHOUT CONTRAST
TECHNIQUE: Multidetector CT imaging of the abdomen and pelvis was performed
following the standard protocol without IV contrast.

[Series 3: ap without · axial · non-contrast · 0.83mm/px · z∈[-608,-203]mm · 14 of 93 slices shown, 16 images]
[im 6/93  soft-tissue]
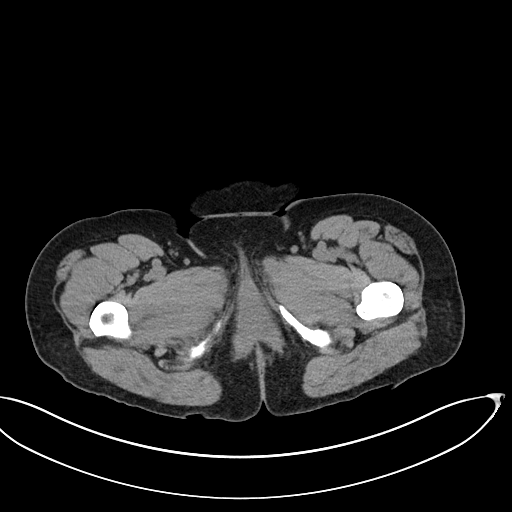
[im 6/93  bone]
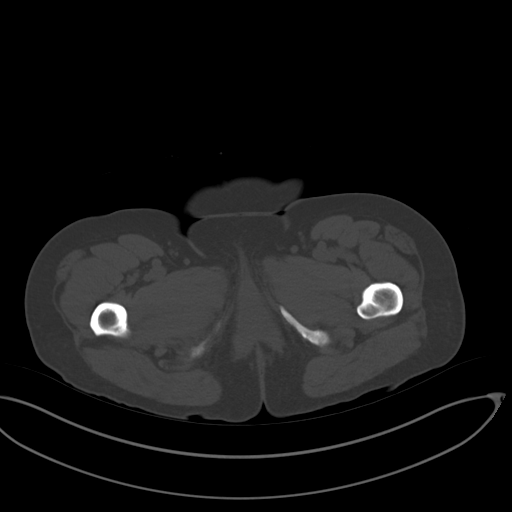
[im 11/93  soft-tissue]
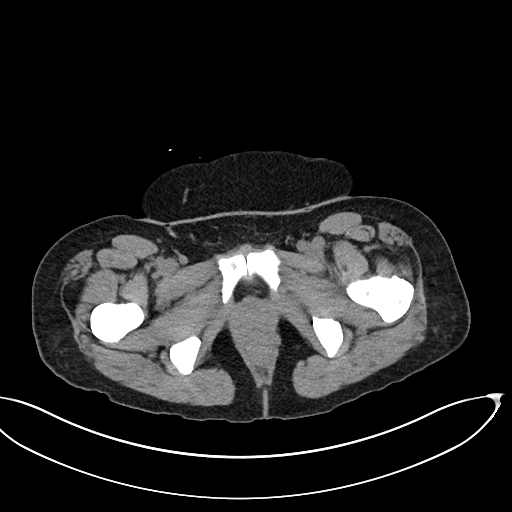
[im 21/93  soft-tissue]
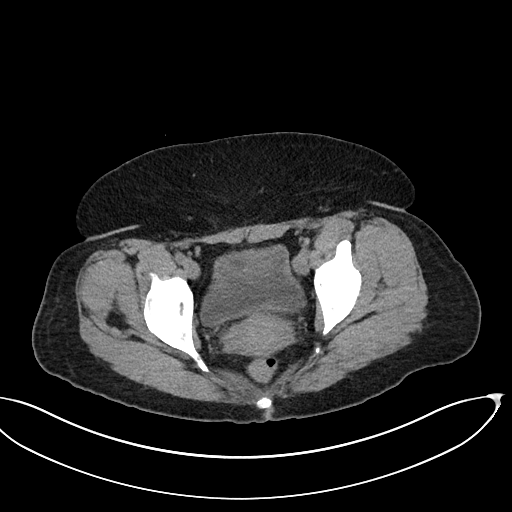
[im 26/93  soft-tissue]
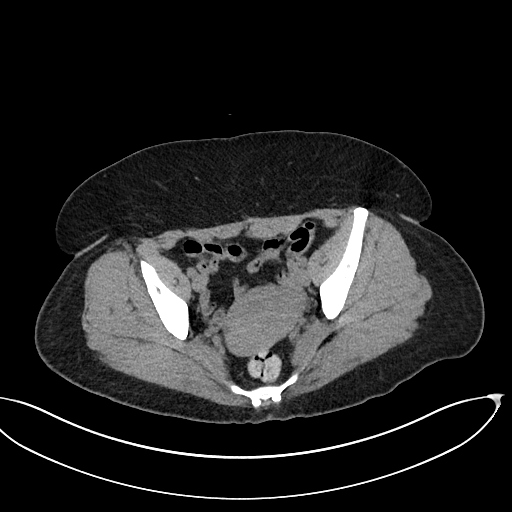
[im 31/93  soft-tissue]
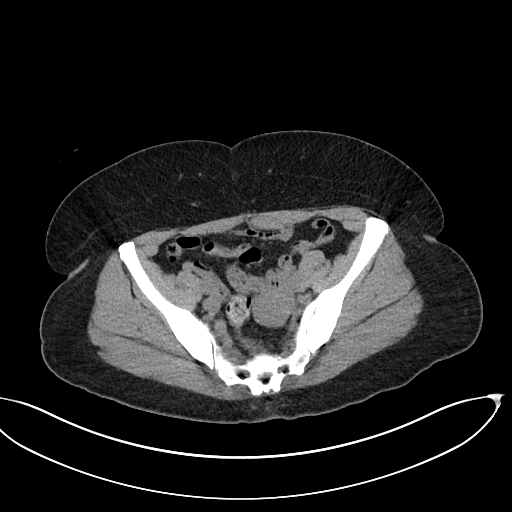
[im 36/93  soft-tissue]
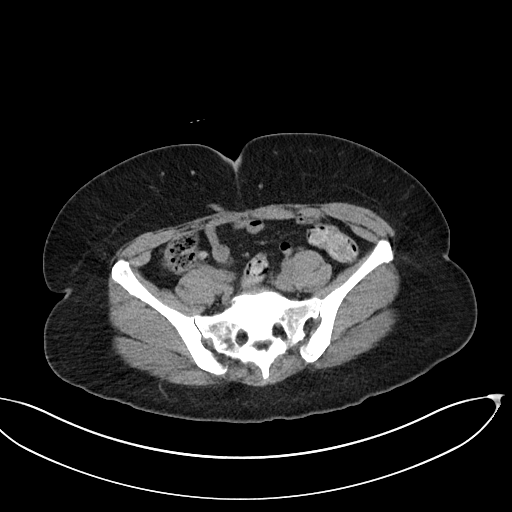
[im 41/93  soft-tissue]
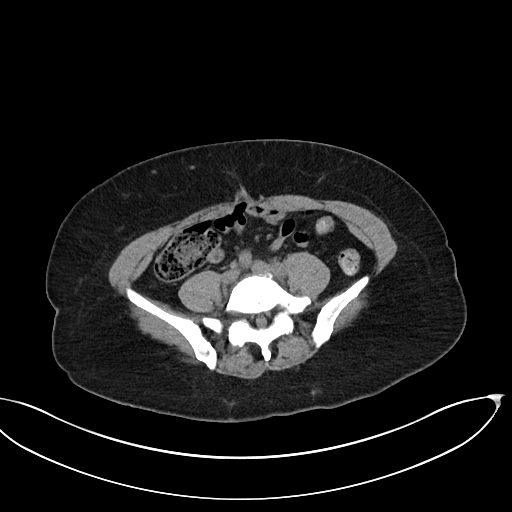
[im 52/93  soft-tissue]
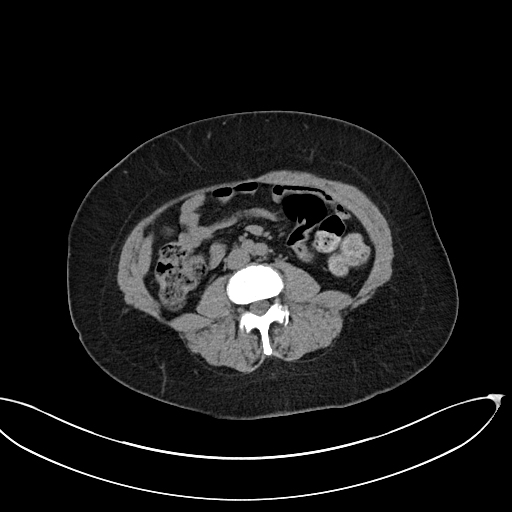
[im 57/93  soft-tissue]
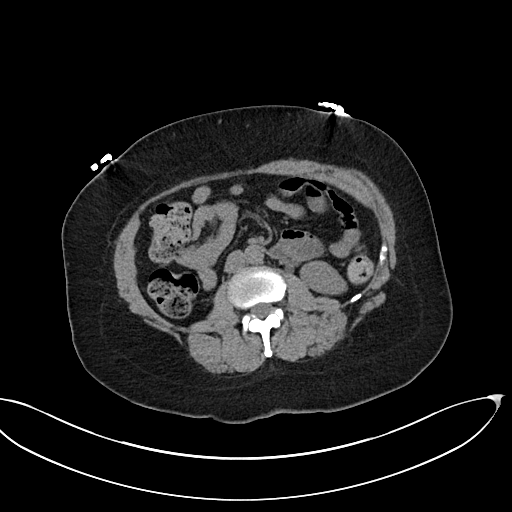
[im 57/93  bone]
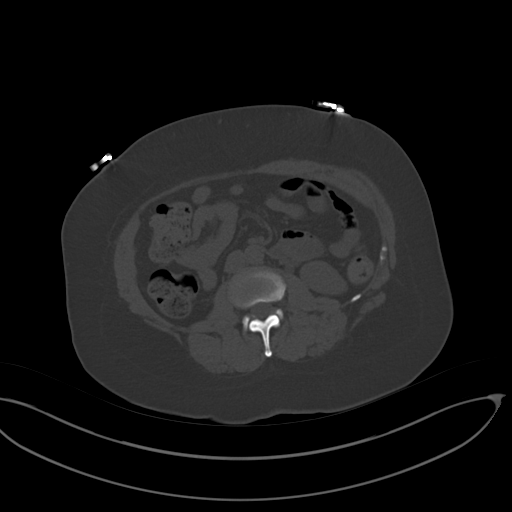
[im 62/93  soft-tissue]
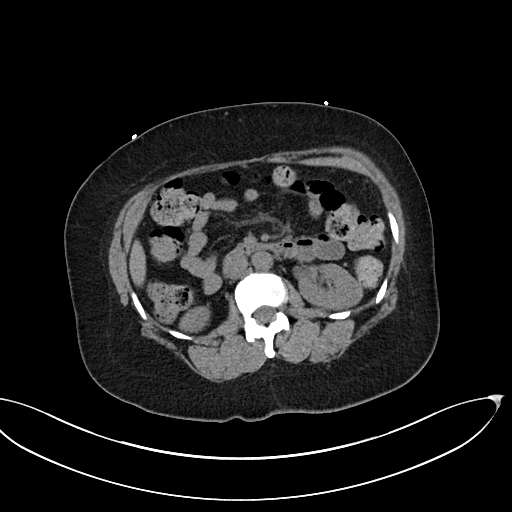
[im 67/93  soft-tissue]
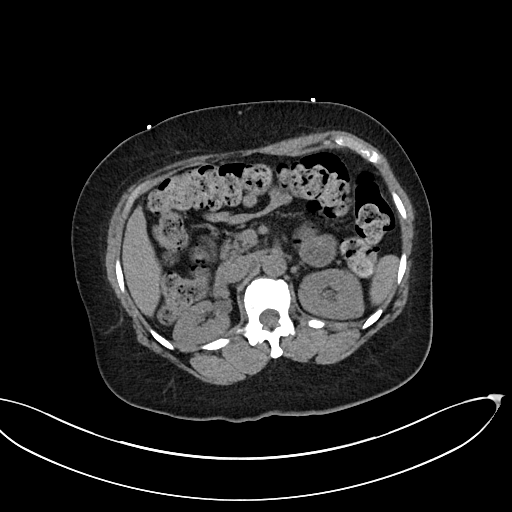
[im 72/93  soft-tissue]
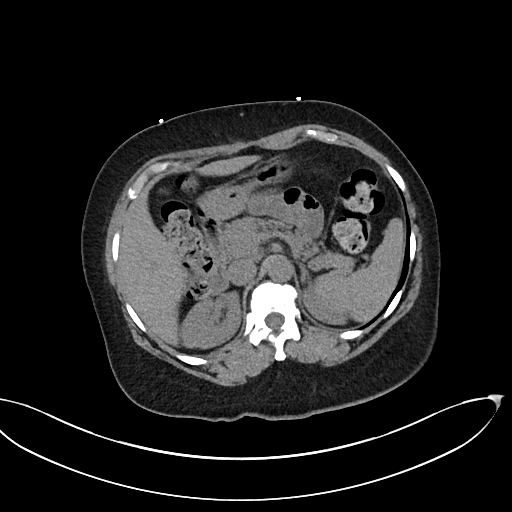
[im 82/93  soft-tissue]
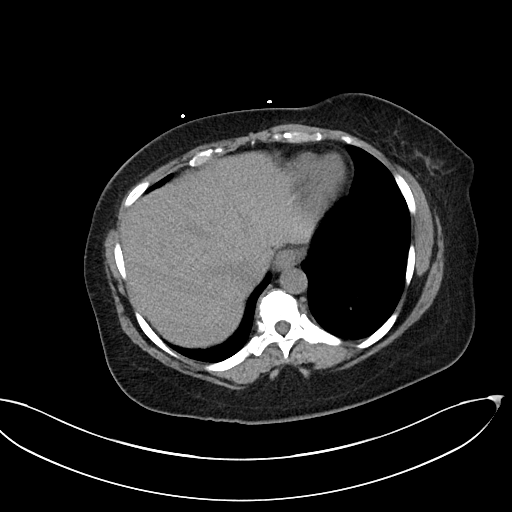
[im 87/93  soft-tissue]
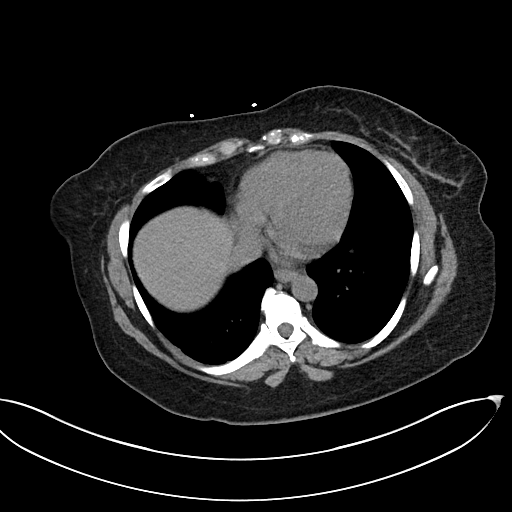

[Series 6: cor · coronal · 0.77mm/px · 3 of 94 slices shown]
[im 32/94  soft-tissue]
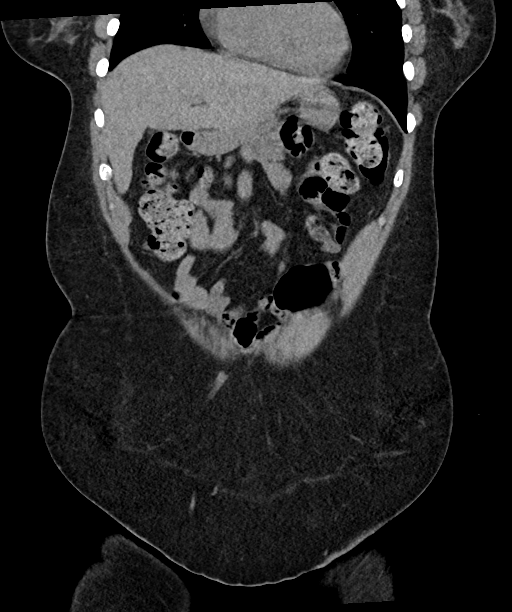
[im 42/94  soft-tissue]
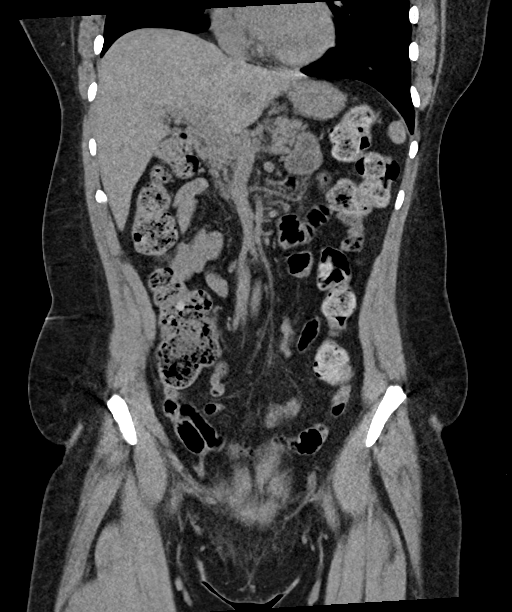
[im 52/94  soft-tissue]
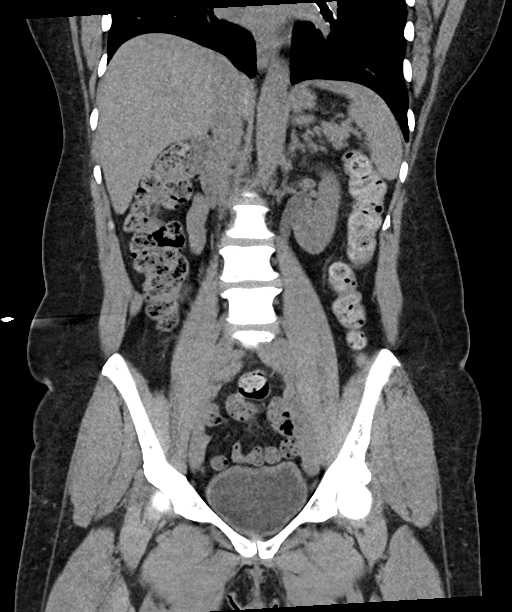

[17 of 46 positions shown; findings below may reference images not displayed]

FINDINGS: Lower chest: Lung bases are clear.

Hepatobiliary: Liver, gallbladder and biliary tree are normal.

Pancreas: Normal.

Spleen: Normal.

Adrenals/Urinary Tract: Adrenal glands are normal. Kidneys normal in
size without hydronephrosis or nephrolithiasis. Ureters and bladder
are normal.

Stomach/Bowel: Stomach and small bowel are normal. Appendix is
normal. Colon is normal.

Vascular/Lymphatic: Abdominal aorta is normal caliber. No
adenopathy.

Reproductive: Mild nodular contour to the uterus suggesting
fibroids. Adnexal regions unremarkable.

Other: No free fluid or focal inflammatory change.

Musculoskeletal: No focal abnormality.
IMPRESSION: 1. No acute findings in the abdomen/pelvis.
2. Mild nodular contour to the uterus suggesting fibroids.

## 2022-08-21 ENCOUNTER — Encounter: Payer: Medicare PPO | Admitting: Genetic Counselor

## 2022-08-21 ENCOUNTER — Telehealth: Payer: Self-pay | Admitting: Cardiology

## 2022-08-21 NOTE — Telephone Encounter (Signed)
We can send in the 30 day and give her samples if needed.

## 2022-08-21 NOTE — Telephone Encounter (Signed)
Spoke with the patient who states she thought that she had a message from me last week. Advised that I did not reach out to her and it could have been an old message.  Answered all of patient's questions in regards to upcoming lab work, CT scan and ablation.

## 2022-08-21 NOTE — Telephone Encounter (Signed)
Follow Up:    Patient says she is returning Emily Rice's call from last week.

## 2022-08-21 NOTE — Telephone Encounter (Signed)
Usually this medication is not allowed a 90 day which is the case here as well.  [employer] approves ONLY 30 day supply of CAPLYTA  42 MG effective through 03/12/2023

## 2022-08-23 ENCOUNTER — Ambulatory Visit: Payer: Medicare PPO | Admitting: Adult Health

## 2022-08-24 ENCOUNTER — Other Ambulatory Visit: Payer: Self-pay

## 2022-08-24 DIAGNOSIS — F411 Generalized anxiety disorder: Secondary | ICD-10-CM

## 2022-08-24 MED ORDER — DIAZEPAM 10 MG PO TABS
10.0000 mg | ORAL_TABLET | Freq: Two times a day (BID) | ORAL | 0 refills | Status: DC
Start: 2022-08-24 — End: 2022-10-01

## 2022-08-24 NOTE — Telephone Encounter (Signed)
Pended diazepam

## 2022-08-28 ENCOUNTER — Other Ambulatory Visit: Payer: Medicaid Other

## 2022-08-28 ENCOUNTER — Ambulatory Visit: Payer: Medicare PPO | Admitting: Adult Health

## 2022-08-29 ENCOUNTER — Ambulatory Visit: Payer: Medicare PPO | Attending: Genetic Counselor | Admitting: Genetic Counselor

## 2022-08-29 ENCOUNTER — Ambulatory Visit: Payer: Medicare PPO | Admitting: Adult Health

## 2022-08-29 ENCOUNTER — Telehealth: Payer: Self-pay | Admitting: Cardiology

## 2022-08-29 NOTE — Telephone Encounter (Signed)
Patient is asking that you give her a call. Please advise  

## 2022-08-30 ENCOUNTER — Telehealth: Payer: Self-pay | Admitting: Cardiology

## 2022-08-30 NOTE — Telephone Encounter (Signed)
Left message for patient to call back  

## 2022-08-30 NOTE — Telephone Encounter (Signed)
Spoke to the pt, she is returning Emily Rice, Emily Rice fundraiser phone call. However, no documentation that nurse called the pt. Offered to help the pt, she preferred to speak with [name] . Will forward to nurse.

## 2022-08-30 NOTE — Telephone Encounter (Signed)
Pt returning nurses phone call and would like a callback. Please advise.  

## 2022-08-31 ENCOUNTER — Encounter: Payer: Self-pay | Admitting: Adult Health

## 2022-08-31 ENCOUNTER — Telehealth: Payer: Self-pay | Admitting: Cardiology

## 2022-08-31 ENCOUNTER — Ambulatory Visit (INDEPENDENT_AMBULATORY_CARE_PROVIDER_SITE_OTHER): Payer: Medicare PPO | Admitting: Adult Health

## 2022-08-31 DIAGNOSIS — F411 Generalized anxiety disorder: Secondary | ICD-10-CM

## 2022-08-31 DIAGNOSIS — F09 Unspecified mental disorder due to known physiological condition: Secondary | ICD-10-CM | POA: Diagnosis not present

## 2022-08-31 DIAGNOSIS — F310 Bipolar disorder, current episode hypomanic: Secondary | ICD-10-CM | POA: Diagnosis not present

## 2022-08-31 DIAGNOSIS — G8929 Other chronic pain: Secondary | ICD-10-CM | POA: Diagnosis not present

## 2022-08-31 DIAGNOSIS — F319 Bipolar disorder, unspecified: Secondary | ICD-10-CM | POA: Diagnosis not present

## 2022-08-31 DIAGNOSIS — K219 Gastro-esophageal reflux disease without esophagitis: Secondary | ICD-10-CM | POA: Diagnosis not present

## 2022-08-31 DIAGNOSIS — D573 Sickle-cell trait: Secondary | ICD-10-CM | POA: Diagnosis not present

## 2022-08-31 DIAGNOSIS — F4310 Post-traumatic stress disorder, unspecified: Secondary | ICD-10-CM | POA: Diagnosis not present

## 2022-08-31 DIAGNOSIS — I480 Paroxysmal atrial fibrillation: Secondary | ICD-10-CM | POA: Diagnosis not present

## 2022-08-31 DIAGNOSIS — N1831 Chronic kidney disease, stage 3a: Secondary | ICD-10-CM | POA: Diagnosis not present

## 2022-08-31 NOTE — Telephone Encounter (Signed)
Spoke with the patient who states that she is having pain in the back of her leg. She has a history of DVTs. She denies any shortness of breath or chest pain. She states that her leg is swollen. She reports that she did miss a dose of her coumadin yesterday. She states today pain is much worse and rates it as an 8/10. She states that pain worsens with walking. Advised patient to go to the ER for evaluation. Patient is agreeable to plan.
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
## 2022-08-31 NOTE — Telephone Encounter (Signed)
Patient called stating she had DVT in her leg about 3-4 month ago, it was a 3rd one. She states she is scheduled for an ablation on 8/6.  She wants to know if she go for a ultrasound of leg now.  She would like to speak to [place] about this.

## 2022-08-31 NOTE — Telephone Encounter (Signed)
See above encounter.

## 2022-09-03 ENCOUNTER — Other Ambulatory Visit (HOSPITAL_BASED_OUTPATIENT_CLINIC_OR_DEPARTMENT_OTHER): Payer: Medicaid Other

## 2022-09-03 NOTE — Progress Notes (Cosign Needed)
Pre Test GC  Referring Provider: Steffanie Dunn, MD   Referral Reason  Tykisha Areola was referred for genetic consult and testing of inherited hypercoagulable conditions considering her personal history of recurrent deep vein thrombosis and pulmonary embolism.    Personal Medical Information Jeanita (II.2 on pedigree), a retired Engineer, civil (consulting), is a 62 year-old [place] American woman who reports having a pulmonary embolism at age 89. She tells me that she noticed her right foot was swollen after a 12-hour shift at [employer] [place]. When she complained of bilateral chest pains, her colleague took her to the ER and was found to have a pulmonary embolism. She was hospitalized and treated with anticoagulants. 3 months after coming off Coumadin about a year later, while hospitalized for a psychiatric illness, she recognized leg pains reminiscent of a DVT and underwent a CT that detected DVT. Upon being discharged, she reports being seen by a Geneticist in [place] and undergoing blood work including genetic testing for hypercoagulable conditions. She recollects being told that her test results were normal.   I have asked her to upload a copy of this in her electronic records. She intends to reach out to [facility] to procure a copy of the test that was done sometime in 2002.   Family history  Relation to Proband Pedigree # Current age Heart condition/age of onset Notes  Son IV.1 34 None Sickle cell trait  Daughter IV.2 28 None   Brother III.2 58 None Sickle cell trait        Father II.4 Deceased Multiple DVTs  observed in CT scan at 11  Died @ 57 from stroke  Paternal aunts, 2 II.1, II.3 54,  II.3-Deceased II.1 and II.3- CAD II.3- Died of breast cancer @ 32s  Paternal uncle II.2 Deceased  Died at 108s from a suspected asthma attack while driving   Paternal grandfather I.1 Deceased Unaware Died @ 59s from a gunshot wound  Paternal grandmother I.2 Deceased Unaware Died @ 88s from a M.I.        Mother II.5  13 None   Maternal aunts, 4 II.6-II.8, II.10 70s-80s II.10-Deceased None II.10- Died suddenly in her 92s  Maternal uncles, 4 II.7-II.9, II.11 31s II.11-Deceased None II.11- Died of ? @ ?  Maternal grandfather I.3 Deceased None Died @ 73 from old age  Maternal grandmother I.4 Deceased None Died @ late 75s from old age   Pre-test Genetic Consult notes  I explained to the patient that an inherited hypercoagulable condition is associated with increasing likelihood of forming blood clots deep in a vein. If the blood clot breaks off it can travel to lungs and block one of the pulmonary arteries leading to a pulmonary embolism or if in the brain, to a stroke. Defects in the blood clotting cascade, either due to mutations in factor V, factor 2 or deficiencies of antithrombin, protein C and protein S can increase the risk of forming blood clots.   A genetic defect in the background of risk factors can dramatically increase the chance of DVT. Risk factors include being immobile for long periods, such as bed rest, long flights, surgery, pregnancy, trauma, dehydration, drug abuse to name a few.  Considering the significant family history of DVT in her father, it is highly likely that she has inherited the hypercoagulable state condition from her father. This is an autosomal dominant condition; hence her children are at a 50% risk of inheriting this condition. She verbalized understanding of this.   Patient should be aware  of the protections afforded by the Genetic Information Non-Discrimination Act (GINA). GINA protects one from losing their employment or health insurance based on their genotype. However, these protections do not cover life insurance and disability. It is recommended that children procure life insurance prior to undergoing genetic testing.  Please note that the patient has not been counseled in this visit on other personal, cultural, or ethical issues that she may face due to her heart  condition.   Plan Since Liisa states that she has undergone genetic testing at [facility] for DVT, I have asked her to obtain a copy of this test report to review. She will discuss obtaining life insurance with her children. She also reports having pain in the back of her knee akin to a DVT. I recommend that she call Dr. Lovena Neighbours office and inform them to ensure she gets appropriate relief or treatment.  Sidney Ace, Ph.D, [facility] Clinical Molecular Geneticist

## 2022-09-03 NOTE — Telephone Encounter (Signed)
Follow Up:    This patient says she is returning Carlyle's call.

## 2022-09-03 NOTE — Telephone Encounter (Signed)
I did not call the patient today. Not sure who did. Called patient and left her a message.

## 2022-09-04 ENCOUNTER — Telehealth: Payer: Self-pay | Admitting: Cardiology

## 2022-09-04 DIAGNOSIS — Z86718 Personal history of other venous thrombosis and embolism: Secondary | ICD-10-CM

## 2022-09-04 NOTE — Telephone Encounter (Signed)
Patient is returning call.  °

## 2022-09-04 NOTE — Telephone Encounter (Signed)
Left message for patient to call back  

## 2022-09-04 NOTE — Telephone Encounter (Signed)
Pt is requesting a callback from nurse Carlyle. Please advise

## 2022-09-05 NOTE — Telephone Encounter (Signed)
Spoke with the patient and answered her questions in regards to upcoming appointments. Patient never went to the ER after speaking last week about concern for DVT. She is scheduled for an ultrasound on Friday 6/28. She is aware to go to the ER prior if she develops any chest pain or shortness of breath.

## 2022-09-05 NOTE — Telephone Encounter (Signed)
Patient returned RN's call. 

## 2022-09-07 ENCOUNTER — Ambulatory Visit (HOSPITAL_COMMUNITY)
Admission: RE | Admit: 2022-09-07 | Discharge: 2022-09-07 | Disposition: A | Discharge status: Home / Self Care | Payer: Medicare PPO | Source: Ambulatory Visit | Attending: Vascular Surgery | Admitting: Vascular Surgery

## 2022-09-07 ENCOUNTER — Telehealth: Payer: Self-pay | Admitting: Cardiology

## 2022-09-07 DIAGNOSIS — Z86718 Personal history of other venous thrombosis and embolism: Secondary | ICD-10-CM | POA: Diagnosis not present

## 2022-09-07 NOTE — Telephone Encounter (Signed)
Caller reporting on ruling out DVD.

## 2022-09-07 NOTE — Telephone Encounter (Signed)
Emily Rice states she just finished scan on patient and she "cannot find a clot on her." Will route to RN [name] to make aware of negative study.   September 05, 2022 [name], [place]   09/05/22  1:27 PM Note Spoke with the patient and answered her questions in regards to upcoming appointments. Patient never went to the ER after speaking last week about concern for DVT. She is scheduled for an ultrasound on Friday 6/28. She is aware to go to the ER prior if she develops any chest pain or shortness of breath.

## 2022-09-12 DIAGNOSIS — Z7901 Long term (current) use of anticoagulants: Secondary | ICD-10-CM | POA: Diagnosis not present

## 2022-09-17 ENCOUNTER — Telehealth: Payer: Self-pay | Admitting: Adult Health

## 2022-09-17 ENCOUNTER — Encounter: Payer: Self-pay | Admitting: Cardiology

## 2022-09-17 NOTE — Telephone Encounter (Signed)
Emily Rice called at 10:00 to report that she is really with staying awake. She said she can't even have a conversation with anyone without falling asleep in the middle of it. Same with watching TV, etc.  She needs to know if medications need to be adjusted or what she should do.  Please call.  She requested that Almira Coaster be the one to call so she doesn't have to go back and forth with calls between [place], the nurse and her.

## 2022-09-17 NOTE — Telephone Encounter (Signed)
error 

## 2022-09-17 NOTE — Telephone Encounter (Signed)
Please see message. °

## 2022-09-18 ENCOUNTER — Telehealth: Payer: Self-pay | Admitting: Cardiology

## 2022-09-18 NOTE — Telephone Encounter (Signed)
Patient reporting taking 50 mg of Seroquel and taking diazepam BID.  Patient reporting always falling asleep during church at home. She said she loves church but just can't stay awake.

## 2022-09-18 NOTE — Telephone Encounter (Signed)
Follow Up:       Patient would like for Carlyle to please give her a call. She have some questions about her appointments.

## 2022-09-18 NOTE — Telephone Encounter (Signed)
Spoke with the patient in regards to her appointment with Dr. Lalla Brothers on Thursday 7/11. It was originally scheduled due to leg pain and concern for a blood clot. Patient had a lower extremity venous ultrasound and blood clot was ruled out.  Advised patient to keep appointment with Dr. Lalla Brothers since she has not been seen since 01/2022 and she is scheduled for an ablation in 10/2022. She will have lab work drawn for her procedure on the same day. Patient verbalized understanding.

## 2022-09-18 NOTE — Telephone Encounter (Signed)
Called patient with the recommendations. She asked me to also send them in MyChart, which I did.

## 2022-09-18 NOTE — Telephone Encounter (Signed)
Has she stopped the Seroquel? How is she taking the Valium?

## 2022-09-19 ENCOUNTER — Telehealth: Payer: Self-pay | Admitting: *Deleted

## 2022-09-19 NOTE — Progress Notes (Unsigned)
  Electrophysiology Office Follow up Visit Note:    Date:  09/19/2022   ID:  Emily Rice, DOB 1960/07/20, MRN [id number]  PCP:  Ollen Bowl, MD  [facility] Cardiologist:  Tessa Lerner, DO  [facility] Electrophysiologist:  Lanier Prude, MD    Interval History:    Emily Rice is a 62 y.o. female who presents for a follow up visit.   Last seen February 05, 2022 for atrial fibrillation.  She has a history of recurrent DVT/PE, traumatic subarachnoid hemorrhage, bipolar disorder, sickle cell trait and symptomatic atrial fibrillation.  At her last appointment we discussed catheter ablation.  She is currently scheduled for ablation on October 16, 2022. She recently had lower extremity pain that was evaluated with lower extremity Dopplers which showed no evidence of blood clots.      Past medical, surgical, social and family history were reviewed.  ROS:   Please see the history of present illness.    All other systems reviewed and are negative.  EKGs/Labs/Other Studies Reviewed:    The following studies were reviewed today:  September 07, 2022 lower extremity venous Dopplers No evidence of DVT       Physical Exam:    VS:  LMP  (LMP Unknown)     Wt Readings from Last 3 Encounters:  07/12/22 175 lb 0.7 oz (79.4 kg)  05/19/22 175 lb (79.4 kg)  02/05/22 187 lb 3.2 oz (84.9 kg)     GEN: *** Well nourished, well developed in no acute distress CARDIAC: ***RRR, no murmurs, rubs, gallops RESPIRATORY:  Clear to auscultation without rales, wheezing or rhonchi       ASSESSMENT:    No diagnosis found. PLAN:    In order of problems listed above:  #Atrial fibrillation Symptomatic.  Recurrent.  Previously discussed catheter ablation versus antiarrhythmic since she wished to proceed with catheter ablation.  She is currently scheduled for ablation in August of this year.  Procedure reviewed again and she wishes to proceed.  Discussed treatment options today for AF  including antiarrhythmic drug therapy and ablation. Discussed risks, recovery and likelihood of success with each treatment strategy. Risk, benefits, and alternatives to EP study and radiofrequency ablation for afib were discussed. These risks include but are not limited to stroke, bleeding, vascular damage, tamponade, perforation, damage to the esophagus, lungs, phrenic nerve and other structures, pulmonary vein stenosis, worsening renal function, and death.  Discussed potential need for repeat ablation procedures and antiarrhythmic drugs after an initial ablation. The patient understands these risk and wishes to proceed.  We will therefore proceed with catheter ablation at the next available time.  Carto, ICE, anesthesia are requested for the procedure.  Will also obtain CT PV protocol prior to the procedure to exclude LAA thrombus and further evaluate atrial anatomy.  #DVT history Continue OAC       Signed, Steffanie Dunn, MD, [facility], [facility] 09/19/2022 8:52 AM    Electrophysiology [facility]

## 2022-09-19 NOTE — Telephone Encounter (Signed)
   Pre-operative Risk Assessment    Patient Name: Emily Rice  DOB: 1961-03-06 MRN: [id number]      Request for Surgical Clearance    Procedure:   HYSTEROSCOPY WITH DILATION AND CURETTAGE WITH MYOSURE  Date of Surgery:  Clearance 11/28/22                                 Surgeon:  NOT LISTED Surgeon's Group or Practice Name:  [facility] Phone number:  [phone]  Fax number:  [phone] ATTN: CARSHENIA SPRUILL   Type of Clearance Requested:   - Medical  - Pharmacy:  Hold Warfarin (Coumadin)     Type of Anesthesia:   CHOICE   Additional requests/questions:    Elpidio Anis   09/19/2022, 9:12 AM

## 2022-09-19 NOTE — Progress Notes (Incomplete)
Electrophysiology Office Follow up Visit Note:    Date:  09/20/2022   ID:  Emily Rice, DOB 1961-01-11, MRN [id number]  PCP:  Ollen Bowl, MD  [facility] Cardiologist:  Tessa Lerner, DO  [facility] Electrophysiologist:  Lanier Prude, MD    Interval History:    Emily Rice is a 62 y.o. female who presents for a follow up visit.   Last seen February 05, 2022 for atrial fibrillation.  She has a history of recurrent DVT/PE, traumatic subarachnoid hemorrhage, bipolar disorder, sickle cell trait and symptomatic atrial fibrillation.  At her last appointment we discussed catheter ablation.  She is currently scheduled for ablation on October 16, 2022. She recently had lower extremity pain that was evaluated with lower extremity Dopplers which showed no evidence of blood clots.  Today, she states she is generally feeling stable. However, she continues to experience significant LE pain localized to the popliteal region on the right. This has been especially painful while trying to sleep at night.  Additionally she is concerned of a possible infection in her posterior gums, with headaches. Her dentist has referred her to a periodontist, but is unable to see her until December or January. She has been trying every few days to see if they have cancellations.  She denies any palpitations, chest pain, shortness of breath, peripheral edema, lightheadedness, syncope, orthopnea, or PND.     Past medical, surgical, social and family history were reviewed.  ROS:  Please see the history of present illness.  All other systems reviewed and are negative. (+) Bilateral LE pain to the popliteal regions (+) Pain of posterior gums with headaches  EKGs/Labs/Other Studies Reviewed:    The following studies were reviewed today:  September 07, 2022 lower extremity venous Dopplers No evidence of DVT       Physical Exam:    VS:  BP 120/76   Pulse (!) 58   Ht 5\' 3"  (1.6 m)   Wt 189 lb 6.4  oz (85.9 kg)   LMP  (LMP Unknown)   SpO2 98%   BMI 33.55 kg/m     Wt Readings from Last 3 Encounters:  09/20/22 189 lb 6.4 oz (85.9 kg)  07/12/22 175 lb 0.7 oz (79.4 kg)  05/19/22 175 lb (79.4 kg)     GEN:  Well nourished, well developed in no acute distress CARDIAC: RRR, no murmurs, rubs, gallops RESPIRATORY:  Clear to auscultation without rales, wheezing or rhonchi       ASSESSMENT:    1. Paroxysmal atrial fibrillation (HCC)   2. SAH (subarachnoid hemorrhage) (HCC)    PLAN:    In order of problems listed above:  #Atrial fibrillation Symptomatic.  Recurrent.  Previously discussed catheter ablation versus antiarrhythmic since she wished to proceed with catheter ablation.  She is currently scheduled for ablation in August of this year.  Unfortunately, she has an active infection involving the gums around her back teeth.  She is working to get in with a Biomedical scientist.  She will call us to reschedule her ablation after she is seen the periodontist and receive clearance for the procedure.  I will schedule her with an APP in 6 months is a backstop so that we can check on progress with her infection.  #DVT history Continue OAC  I,Mathew Stumpf,acting as a Neurosurgeon for Lanier Prude, MD.,have documented all relevant documentation on the behalf of Lanier Prude, MD,as directed by  Lanier Prude, MD while in the presence  of Lanier Prude, MD.  I, Lanier Prude, MD, have reviewed all documentation for this visit. The documentation on 09/20/22 for the exam, diagnosis, procedures, and orders are all accurate and complete.   Signed, Steffanie Dunn, MD, [facility], [facility] 09/20/2022 9:00 AM    Electrophysiology [facility]

## 2022-09-20 ENCOUNTER — Ambulatory Visit: Payer: Medicare PPO

## 2022-09-20 ENCOUNTER — Ambulatory Visit: Payer: Medicare PPO | Attending: Cardiology | Admitting: Cardiology

## 2022-09-20 ENCOUNTER — Encounter: Payer: Self-pay | Admitting: Cardiology

## 2022-09-20 VITALS — BP 120/76 | HR 58 | Ht 63.0 in | Wt 189.4 lb

## 2022-09-20 DIAGNOSIS — I480 Paroxysmal atrial fibrillation: Secondary | ICD-10-CM

## 2022-09-20 DIAGNOSIS — I609 Nontraumatic subarachnoid hemorrhage, unspecified: Secondary | ICD-10-CM | POA: Diagnosis not present

## 2022-09-20 NOTE — Patient Instructions (Signed)
Medication Instructions:  Your physician recommends that you continue on your current medications as directed. Please refer to the Current Medication list given to you today. *If you need a refill on your cardiac medications before your next appointment, please call your pharmacy*  Testing/Procedures: Contact us when you are ready to reschedule your ablation  Your physician has recommended that you have an ablation. Catheter ablation is a medical procedure used to treat some cardiac arrhythmias (irregular heartbeats). During catheter ablation, a long, thin, flexible tube is put into a blood vessel in your groin (upper thigh), or neck. This tube is called an ablation catheter. It is then guided to your heart through the blood vessel. Radio frequency waves destroy small areas of heart tissue where abnormal heartbeats may cause an arrhythmia to start. Please see the instruction sheet given to you today.   Follow-Up: At [facility], you and your health needs are our priority.  As part of our continuing mission to provide you with exceptional heart care, we have created designated Provider Care Teams.  These Care Teams include your primary Cardiologist (physician) and Advanced Practice Providers (APPs -  Physician Assistants and Nurse Practitioners) who all work together to provide you with the care you need, when you need it.  We recommend signing up for the patient portal called "MyChart".  Sign up information is provided on this After Visit Summary.  MyChart is used to connect with patients for Virtual Visits (Telemedicine).  Patients are able to view lab/test results, encounter notes, upcoming appointments, etc.  Non-urgent messages can be sent to your provider as well.   To learn more about what you can do with MyChart, go to [email or web address].    Your next appointment:   6 month(s)  Provider:   You will see one of the following Advanced Practice Providers on your designated Care  Team:   Francis Dowse, [place] [address]" [place], [place] Canary Brim, NP

## 2022-09-20 NOTE — Telephone Encounter (Signed)
   Name: Emily Rice  DOB: [birth year]  MRN: [id number]   Primary Cardiologist: Tessa Lerner, DO  Chart reviewed as part of pre-operative protocol coverage.   General cardiologist is Dr. Odis Hollingshead, who also manages her coumadin. Please reach out to his office for clearance and coumadin hold.  I will route this recommendation to the requesting party via Epic fax function and remove from pre-op pool. Please call with questions.  Roe Rutherford [name], PA 09/20/2022, 11:49 AM

## 2022-09-20 NOTE — Telephone Encounter (Signed)
Anticoagulation is managed by Dr. Odis Hollingshead.  Please defer to that office for clearance.

## 2022-09-24 ENCOUNTER — Telehealth: Payer: Self-pay | Admitting: Cardiology

## 2022-09-24 NOTE — Telephone Encounter (Signed)
Patient calling in about her surgery, would like for the nurse to give her a call back. Please advise

## 2022-09-24 NOTE — Telephone Encounter (Signed)
Patient returned call, transferred to triage.  Patient states she has an infection of her back teeth/gums. She had to cancel 8/6 ablation with Dr. Lalla Brothers due to infection. She wanted to be sure labs and CT have also been cancelled--confirmed all appointments related to ablation have been cancelled.  Patient states her dentist referred her to a periodontist, but cannot get in to be seen until January. She states she is not currently taking anything for treatment of infection. Advised patient to contact her dentist to see if there is any one else she can be referred to who might be able to see her sooner. Patient verbalized understanding.

## 2022-09-24 NOTE — Telephone Encounter (Signed)
Left message to call back  

## 2022-09-25 ENCOUNTER — Other Ambulatory Visit: Payer: Medicaid Other

## 2022-09-29 ENCOUNTER — Other Ambulatory Visit: Payer: Self-pay | Admitting: Adult Health

## 2022-09-29 DIAGNOSIS — F411 Generalized anxiety disorder: Secondary | ICD-10-CM

## 2022-10-01 ENCOUNTER — Telehealth: Payer: Self-pay | Admitting: Adult Health

## 2022-10-01 ENCOUNTER — Other Ambulatory Visit: Payer: Self-pay

## 2022-10-01 MED ORDER — DIAZEPAM 5 MG PO TABS: 5.0000 mg | ORAL_TABLET | Freq: Two times a day (BID) | ORAL | 0 refills | Status: DC

## 2022-10-01 NOTE — Telephone Encounter (Signed)
Pt requesting RF diazepam [facility] [address]

## 2022-10-01 NOTE — Telephone Encounter (Signed)
Pended.

## 2022-10-09 ENCOUNTER — Other Ambulatory Visit (HOSPITAL_COMMUNITY): Payer: Medicaid Other

## 2022-10-09 ENCOUNTER — Ambulatory Visit (HOSPITAL_COMMUNITY)
Admission: RE | Admit: 2022-10-09 | Discharge: 2022-10-09 | Disposition: A | Discharge status: Home / Self Care | Payer: Medicare PPO | Source: Ambulatory Visit | Attending: Cardiology | Admitting: Cardiology

## 2022-10-10 ENCOUNTER — Ambulatory Visit: Payer: Medicare PPO | Admitting: Adult Health

## 2022-10-10 DIAGNOSIS — Z7901 Long term (current) use of anticoagulants: Secondary | ICD-10-CM | POA: Diagnosis not present

## 2022-10-16 ENCOUNTER — Encounter (HOSPITAL_COMMUNITY): Payer: Self-pay

## 2022-10-16 ENCOUNTER — Ambulatory Visit (HOSPITAL_COMMUNITY): Admit: 2022-10-16 | Payer: Medicare HMO | Admitting: Cardiology

## 2022-10-16 SURGERY — ATRIAL FIBRILLATION ABLATION
Anesthesia: General

## 2022-10-17 ENCOUNTER — Other Ambulatory Visit: Payer: Self-pay | Admitting: Adult Health

## 2022-10-17 ENCOUNTER — Ambulatory Visit: Payer: Medicare PPO | Admitting: Adult Health

## 2022-10-17 NOTE — Telephone Encounter (Signed)
Patient called back with regards to Valium. States that the anxiety is worse in the late morning/afternoon and would like to increase to 10mg  in the morning. Ph: [phone] Appt 8/12

## 2022-10-17 NOTE — Telephone Encounter (Signed)
Pt called and needs a refill on her valium 5 mg. She also wants gina to know that she has not been taking the seroquel at all.She said that if gina wants her to take it she will. Please give her a call at [phone]. [facility] [address]

## 2022-10-18 ENCOUNTER — Ambulatory Visit: Payer: Medicare PPO | Admitting: Neurology

## 2022-10-18 NOTE — Telephone Encounter (Signed)
Patient has FU 8/12. She needs RF on Valium before appt. She is asking to increase from 5 mg to 10 mg QAM and 5 mg QPM. She also reports not taking Seroquel but will if you want her to, then says she is taking 1/2 tablet. She has difficulty trying to explain this, but feel that it can be discussed at appt on Monday. Note from June: Decreased Seroquel 100mg  to 50mg  at bedtime - may d/c Diazepam was decreased due to sleepiness. From 7/9 TC:  Decrease Seroquel to 25mg  at bedtime - have her decrease the valium from 10mg  BID to 5mg  in am and 10mg  at pm for a week and then reduce to 5mg  BID.

## 2022-10-18 NOTE — Telephone Encounter (Signed)
Patient called again. She said she is anxious and not sleeping. She is asking for 10 mg of Valium BID plus something in addition mid afternoon. She also wants to know if she should increase Seroquel back to 100 mg since she isn't sleeping. Please let me know and I'll pend.

## 2022-10-18 NOTE — Telephone Encounter (Signed)
Tried to call patient back and LVM. Told her I would send MyChart message and I did.

## 2022-10-18 NOTE — Telephone Encounter (Signed)
Pt left message requesting RF Diazepam. Only 2 tablets left. [facility] [address]

## 2022-10-18 NOTE — Telephone Encounter (Signed)
Patient requesting change in dose and note has been sent to provider.

## 2022-10-19 ENCOUNTER — Other Ambulatory Visit: Payer: Self-pay

## 2022-10-19 MED ORDER — ALPRAZOLAM 0.5 MG PO TABS: 0.5000 mg | ORAL_TABLET | Freq: Two times a day (BID) | ORAL | 0 refills | Status: DC | PRN

## 2022-10-20 ENCOUNTER — Other Ambulatory Visit: Payer: Self-pay | Admitting: Adult Health

## 2022-10-20 DIAGNOSIS — F310 Bipolar disorder, current episode hypomanic: Secondary | ICD-10-CM

## 2022-10-22 ENCOUNTER — Other Ambulatory Visit: Payer: Self-pay | Admitting: Adult Health

## 2022-10-22 ENCOUNTER — Telehealth: Payer: Self-pay | Admitting: Adult Health

## 2022-10-22 ENCOUNTER — Ambulatory Visit: Payer: Medicare PPO | Admitting: Adult Health

## 2022-10-22 ENCOUNTER — Ambulatory Visit (INDEPENDENT_AMBULATORY_CARE_PROVIDER_SITE_OTHER): Payer: Medicare PPO | Admitting: Adult Health

## 2022-10-22 ENCOUNTER — Encounter: Payer: Self-pay | Admitting: Adult Health

## 2022-10-22 DIAGNOSIS — F310 Bipolar disorder, current episode hypomanic: Secondary | ICD-10-CM | POA: Diagnosis not present

## 2022-10-22 DIAGNOSIS — G4700 Insomnia, unspecified: Secondary | ICD-10-CM

## 2022-10-22 DIAGNOSIS — F411 Generalized anxiety disorder: Secondary | ICD-10-CM | POA: Diagnosis not present

## 2022-10-22 DIAGNOSIS — F09 Unspecified mental disorder due to known physiological condition: Secondary | ICD-10-CM | POA: Diagnosis not present

## 2022-10-22 MED ORDER — DIAZEPAM 5 MG PO TABS
5.0000 mg | ORAL_TABLET | Freq: Two times a day (BID) | ORAL | 3 refills | Status: DC
Start: 2022-10-22 — End: 2022-11-22
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
## 2022-10-22 NOTE — Telephone Encounter (Signed)
He has text, videos and photos.  He said she wakes up in a rage.  He said if you wanted to call him back, pls do so.

## 2022-10-22 NOTE — Telephone Encounter (Signed)
Pt's husband LVM @ 8:06a.  He said he is bringing pt to appt today at 5pm, but he wanted Almira Coaster to know that Shritha "is good at masking".  He said she has been in a bad manic state for the last 2 wks.  She rants and has temper tantrums

## 2022-10-22 NOTE — Telephone Encounter (Signed)
Please see messages from husband. I haven't called husband as messages seemed detailed enough.

## 2022-10-22 NOTE — Addendum Note (Signed)
Addended by: Dorothyann Gibbs on: 10/22/2022 05:59 PM   Modules accepted: Orders

## 2022-10-25 ENCOUNTER — Telehealth: Payer: Self-pay | Admitting: Adult Health

## 2022-10-25 ENCOUNTER — Other Ambulatory Visit: Payer: Self-pay

## 2022-10-25 MED ORDER — ALPRAZOLAM 0.5 MG PO TABS: 0.5000 mg | ORAL_TABLET | Freq: Two times a day (BID) | ORAL | 0 refills | Status: DC | PRN

## 2022-10-25 NOTE — Telephone Encounter (Signed)
Emily Rice called at 1:19 to report that she only has 3 days left of her Xanax. She is waiting for the Valium to be able to be filled.  Should she just go without the Xanax and wait for the Valium or would be call in a short refill of the Xanax to tie her over.  Please advise.[id number] [place] [place] - [address]

## 2022-10-25 NOTE — Telephone Encounter (Signed)
Pended.

## 2022-10-29 ENCOUNTER — Telehealth (INDEPENDENT_AMBULATORY_CARE_PROVIDER_SITE_OTHER): Payer: Medicare PPO | Admitting: Family Medicine

## 2022-10-30 ENCOUNTER — Telehealth: Payer: Self-pay | Admitting: *Deleted

## 2022-10-30 NOTE — Telephone Encounter (Signed)
Preoperative team, patient is followed by [facility] cardiology.  Please forward request to them for review/recommendations.  Thank you for your help.

## 2022-10-30 NOTE — Telephone Encounter (Signed)
I will fax notes to requesting office they will need to reach out to primary cardiologist Dr. Odis Hollingshead at [facility].

## 2022-10-30 NOTE — Telephone Encounter (Signed)
   Pre-operative Risk Assessment    Patient Name: Emily Rice  DOB: 08-Jan-1961 MRN: [id number]    DATE OF LAST VISIT: 09/20/22 WITH DR. Lalla Brothers DATE OF NEXT VISIT: NONE SCHEDULED   Request for Surgical Clearance    Procedure:   HYSTEROSCOPY WITH D&C WITH MYOSURE  Date of Surgery:  Clearance 11/28/22                                 Surgeon:  DR. Efraim Kaufmann DAVIES Surgeon's Group or Practice Name:  [facility] Phone number:  [phone] Fax number:  [phone] ATTN: CARSHENIA SPRUILL   Type of Clearance Requested:   - Medical  - Pharmacy:  Hold Warfarin (Coumadin)     Type of Anesthesia:   CHOICE   Additional requests/questions:    Elpidio Anis   10/30/2022, 9:35 AM

## 2022-10-30 NOTE — Telephone Encounter (Signed)
Anticoagulation followed by Dr Odis Hollingshead at [facility].  Please reach out to that office for clearance

## 2022-10-31 ENCOUNTER — Telehealth: Payer: Self-pay | Admitting: Adult Health

## 2022-10-31 DIAGNOSIS — F310 Bipolar disorder, current episode hypomanic: Secondary | ICD-10-CM

## 2022-10-31 NOTE — Telephone Encounter (Signed)
LVM to RC 

## 2022-10-31 NOTE — Telephone Encounter (Signed)
Emily Rice has called again. See previous message in chart. She feels like that no one wants to help her and would like a call back from [name]. Phone number is [phone]

## 2022-11-01 ENCOUNTER — Telehealth: Payer: Self-pay | Admitting: Adult Health

## 2022-11-01 DIAGNOSIS — F319 Bipolar disorder, unspecified: Secondary | ICD-10-CM | POA: Diagnosis not present

## 2022-11-01 DIAGNOSIS — R748 Abnormal levels of other serum enzymes: Secondary | ICD-10-CM | POA: Diagnosis not present

## 2022-11-01 DIAGNOSIS — Z86718 Personal history of other venous thrombosis and embolism: Secondary | ICD-10-CM | POA: Diagnosis not present

## 2022-11-01 DIAGNOSIS — M79651 Pain in right thigh: Secondary | ICD-10-CM | POA: Diagnosis not present

## 2022-11-01 DIAGNOSIS — M791 Myalgia, unspecified site: Secondary | ICD-10-CM | POA: Diagnosis not present

## 2022-11-01 DIAGNOSIS — M79604 Pain in right leg: Secondary | ICD-10-CM | POA: Diagnosis not present

## 2022-11-01 DIAGNOSIS — G8929 Other chronic pain: Secondary | ICD-10-CM | POA: Diagnosis not present

## 2022-11-01 DIAGNOSIS — G20B2 Parkinson's disease with dyskinesia, with fluctuations: Secondary | ICD-10-CM | POA: Diagnosis not present

## 2022-11-01 NOTE — Telephone Encounter (Signed)
Even though Rx is for 90 days they are only shipping 30 days. Last shipped 7/17 and RF is in processing.

## 2022-11-01 NOTE — Telephone Encounter (Signed)
Patient returned call. Discussed with Almira Coaster.

## 2022-11-01 NOTE — Telephone Encounter (Signed)
Pt called at 3:45p.  She doesn't have her Caplyta.  [facility] said to expect it in 5-7 days.  She wants to know if she can get samples from [place] until then.  Pls call her when it is ready for pick up or let her know if we don't have it.  Next appt 9/23

## 2022-11-01 NOTE — Telephone Encounter (Signed)
Samples pulled.  Patient notified.

## 2022-11-01 NOTE — Telephone Encounter (Signed)
Emily Rice  picked up samples of Caplyta.  She said [facility] doesn't automatically send the Caplyta.She would need a new prescription each time. This would be normal, so I'm not sure what she meant.  If she 90 days in June, why is she out?

## 2022-11-02 ENCOUNTER — Other Ambulatory Visit: Payer: Self-pay | Admitting: Cardiology

## 2022-11-02 DIAGNOSIS — I480 Paroxysmal atrial fibrillation: Secondary | ICD-10-CM

## 2022-11-06 ENCOUNTER — Encounter: Payer: Self-pay | Admitting: Cardiology

## 2022-11-06 ENCOUNTER — Telehealth: Payer: Self-pay | Admitting: Cardiology

## 2022-11-06 DIAGNOSIS — R259 Unspecified abnormal involuntary movements: Secondary | ICD-10-CM | POA: Diagnosis not present

## 2022-11-06 DIAGNOSIS — I609 Nontraumatic subarachnoid hemorrhage, unspecified: Secondary | ICD-10-CM | POA: Diagnosis not present

## 2022-11-06 NOTE — Telephone Encounter (Signed)
Patient was last seen in the office in July 2023.  Office procedure preprocedure clearance for hysteroscopy, dilatation and curettage with MyoSure with her OB/GYN.  EKG from March 2024 independently reviewed.  Patient denies anginal discomfort or heart failure symptoms.  Overall functional capacity remains stable.  Prior cardiac workup reviewed with the patient over the phone.  Overall acceptable risk for upcoming noncardiac procedure.   Currently on Coumadin given her history of DVT and PE which is being managed by PCP.  Have advised the patient to reach out to PCP for further recommendations with regards to Coumadin dosing prior to upcoming procedure.  She verbalizes understanding.   Tessa Lerner, [place], [facility]  Pager:  [phone] Office: [phone]

## 2022-11-08 DIAGNOSIS — Z7901 Long term (current) use of anticoagulants: Secondary | ICD-10-CM | POA: Diagnosis not present

## 2022-11-21 ENCOUNTER — Encounter: Payer: Self-pay | Admitting: Adult Health

## 2022-11-21 ENCOUNTER — Ambulatory Visit (INDEPENDENT_AMBULATORY_CARE_PROVIDER_SITE_OTHER): Payer: Medicare PPO | Admitting: Adult Health

## 2022-11-21 ENCOUNTER — Other Ambulatory Visit: Payer: Self-pay | Admitting: Adult Health

## 2022-11-21 DIAGNOSIS — G4700 Insomnia, unspecified: Secondary | ICD-10-CM | POA: Diagnosis not present

## 2022-11-21 DIAGNOSIS — F09 Unspecified mental disorder due to known physiological condition: Secondary | ICD-10-CM | POA: Diagnosis not present

## 2022-11-21 DIAGNOSIS — F310 Bipolar disorder, current episode hypomanic: Secondary | ICD-10-CM

## 2022-11-21 DIAGNOSIS — F411 Generalized anxiety disorder: Secondary | ICD-10-CM

## 2022-11-21 MED ORDER — LAMOTRIGINE 200 MG PO TABS
200.0000 mg | ORAL_TABLET | Freq: Two times a day (BID) | ORAL | 0 refills | Status: DC
Start: 2022-11-21 — End: 2022-12-16

## 2022-11-21 MED ORDER — LUMATEPERONE TOSYLATE 42 MG PO CAPS
42.0000 mg | ORAL_CAPSULE | Freq: Every day | ORAL | 0 refills | Status: DC
Start: 2022-11-21 — End: 2023-01-11

## 2022-11-21 MED ORDER — QUETIAPINE FUMARATE 50 MG PO TABS
ORAL_TABLET | ORAL | 1 refills | Status: DC
Start: 2022-11-21 — End: 2023-01-03
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
## 2022-11-22 ENCOUNTER — Encounter: Payer: Self-pay | Admitting: Adult Health

## 2022-11-22 MED ORDER — BUSPIRONE HCL 5 MG PO TABS
5.0000 mg | ORAL_TABLET | Freq: Three times a day (TID) | ORAL | 2 refills | Status: AC
Start: 2022-11-22 — End: ?

## 2022-11-22 MED ORDER — DIAZEPAM 5 MG PO TABS
5.0000 mg | ORAL_TABLET | Freq: Two times a day (BID) | ORAL | 1 refills | Status: DC
Start: 2022-11-22 — End: 2023-01-10

## 2022-11-22 NOTE — Telephone Encounter (Signed)
This encounter was created in error - please disregard.

## 2022-11-26 ENCOUNTER — Ambulatory Visit: Payer: Medicare PPO | Admitting: Sports Medicine

## 2022-11-28 ENCOUNTER — Ambulatory Visit: Payer: Medicare PPO | Admitting: Adult Health

## 2022-11-28 ENCOUNTER — Ambulatory Visit (HOSPITAL_COMMUNITY)
Admission: RE | Admit: 2022-11-28 | Payer: Medicare PPO | Source: Home / Self Care | Admitting: Obstetrics and Gynecology

## 2022-11-29 ENCOUNTER — Ambulatory Visit: Payer: Medicare PPO | Admitting: Adult Health

## 2022-12-03 ENCOUNTER — Ambulatory Visit: Payer: Medicare PPO | Admitting: Adult Health

## 2022-12-04 ENCOUNTER — Telehealth: Payer: Self-pay | Admitting: Adult Health

## 2022-12-04 NOTE — Telephone Encounter (Signed)
Patient called regarding her Diazepam 5mg  medicatio. She asked if she should be taking it as she is taking Buspar 5mg . Has not been taking it and was just unsure if should. Ph: [phone]

## 2022-12-04 NOTE — Telephone Encounter (Signed)
Told patient it was fine to take diazepam and Buspar as prescribed.

## 2022-12-05 NOTE — Progress Notes (Deleted)
Emily Rice D.[facility] [address] [place] [address] Phone: [phone]   Assessment and Plan:     There are no diagnoses linked to this encounter.  ***   Pertinent previous records reviewed include ***   Follow Up: ***     Subjective:   I, Avrey Hyser, am serving as a Neurosurgeon for Doctor Richardean Sale  Chief Complaint: right thigh and leg pain   HPI:   12/06/2022 Patient is a 62 year old female complaining of right thigh and leg pain. Patient states  Relevant Historical Information: ***  Additional pertinent review of systems negative.   Current Outpatient Medications:    acetaminophen (TYLENOL) 325 MG tablet, Take 2 tablets (650 mg total) by mouth every 6 (six) hours as needed for mild pain or moderate pain., Disp: , Rfl:    busPIRone (BUSPAR) 5 MG tablet, Take 1 tablet (5 mg total) by mouth 3 (three) times daily., Disp: 90 tablet, Rfl: 2   carbidopa-levodopa (SINEMET IR) 25-100 MG tablet, Take one tablet in the am and one tablet in the pm (Patient taking differently: Take 1 tablet by mouth 3 (three) times daily. Take one tablet in the am, one tablet in the afternoon, and one tablet in the pm), Disp: 60 tablet, Rfl: 4   cetirizine (ZYRTEC) 10 MG tablet, Take 10 mg by mouth daily as needed for allergies., Disp: , Rfl:    Cholecalciferol 75 MCG (3000 UT) TABS, Take 3,000 Units by mouth daily., Disp: 30 tablet, Rfl: 0   diazepam (VALIUM) 5 MG tablet, Take 1 tablet (5 mg total) by mouth 2 (two) times daily., Disp: 28 tablet, Rfl: 1   diltiazem (CARDIZEM CD) 360 MG 24 hr capsule, TAKE 1 CAPSULE (360 MG TOTAL) BY MOUTH EVERY MORNING., Disp: 90 capsule, Rfl: 0   diphenhydrAMINE (BENADRYL ALLERGY EXTRA STR) 50 MG tablet, Take 1 tablet (50 mg total) by mouth once for 1 dose. Take 1 tablet 1 hour prior to CT scan. (Patient not taking: Reported on 09/20/2022), Disp: 1 tablet, Rfl: 0   escitalopram (LEXAPRO) 10 MG tablet, TAKE 1  TABLET AT BEDTIME, Disp: 90 tablet, Rfl: 3   fexofenadine (ALLEGRA) 180 MG tablet, Take 180 mg by mouth as needed for allergies or rhinitis. (Patient not taking: Reported on 09/20/2022), Disp: , Rfl:    fluticasone (FLONASE) 50 MCG/ACT nasal spray, Place 1 spray into both nostrils daily as needed for allergies. , Disp: , Rfl:    lamoTRIgine (LAMICTAL) 200 MG tablet, Take 1 tablet (200 mg total) by mouth 2 (two) times daily., Disp: 180 tablet, Rfl: 0   lumateperone tosylate (CAPLYTA) 42 MG capsule, Take 1 capsule (42 mg total) by mouth daily., Disp: 90 capsule, Rfl: 0   Multiple Vitamin (MULTIVITAMIN WITH MINERALS) TABS tablet, Take 1 tablet by mouth daily., Disp: , Rfl:    ondansetron (ZOFRAN) 4 MG tablet, Take 4 mg by mouth every 6 (six) hours as needed for nausea/vomiting. (Patient not taking: Reported on 09/20/2022), Disp: , Rfl:    polyethylene glycol (MIRALAX / GLYCOLAX) 17 g packet, Take 17 g by mouth 2 (two) times daily. (Patient taking differently: Take 17 g by mouth daily as needed for mild constipation.), Disp: 14 each, Rfl: 0   predniSONE (DELTASONE) 50 MG tablet, Take 1 tablet 13 hours prior to CT scan, take 1 tablet 7 hours prior to CT scan, take 1 tablet 1 hour prior to CT scan., Disp: 3 tablet, Rfl: 0  QUEtiapine (SEROQUEL) 50 MG tablet, Take one tablet at bedtime., Disp: 90 tablet, Rfl: 1   warfarin (COUMADIN) 5 MG tablet, Take 5 mg by mouth daily., Disp: , Rfl:    Objective:     There were no vitals filed for this visit.    There is no height or weight on file to calculate BMI.    Physical Exam:    ***   Electronically signed by:  Emily Rice D.[facility] 7:47 AM 12/05/22

## 2022-12-06 ENCOUNTER — Emergency Department (HOSPITAL_COMMUNITY): Payer: Medicare PPO

## 2022-12-06 ENCOUNTER — Encounter (HOSPITAL_COMMUNITY): Payer: Self-pay

## 2022-12-06 ENCOUNTER — Emergency Department (HOSPITAL_COMMUNITY)
Admission: EM | Admit: 2022-12-06 | Discharge: 2022-12-07 | Disposition: A | Discharge status: Home / Self Care | Payer: Medicare PPO | Attending: Emergency Medicine | Admitting: Emergency Medicine

## 2022-12-06 ENCOUNTER — Other Ambulatory Visit: Payer: Self-pay

## 2022-12-06 ENCOUNTER — Ambulatory Visit: Payer: Medicare PPO | Admitting: Sports Medicine

## 2022-12-06 VITALS — BP 102/72 | HR 44 | Ht 63.0 in | Wt 185.0 lb

## 2022-12-06 DIAGNOSIS — Z7901 Long term (current) use of anticoagulants: Secondary | ICD-10-CM | POA: Diagnosis not present

## 2022-12-06 DIAGNOSIS — Z86718 Personal history of other venous thrombosis and embolism: Secondary | ICD-10-CM

## 2022-12-06 DIAGNOSIS — M79604 Pain in right leg: Secondary | ICD-10-CM | POA: Diagnosis not present

## 2022-12-06 DIAGNOSIS — I480 Paroxysmal atrial fibrillation: Secondary | ICD-10-CM

## 2022-12-06 DIAGNOSIS — M25561 Pain in right knee: Secondary | ICD-10-CM | POA: Diagnosis not present

## 2022-12-06 DIAGNOSIS — I82431 Acute embolism and thrombosis of right popliteal vein: Secondary | ICD-10-CM

## 2022-12-06 DIAGNOSIS — R0602 Shortness of breath: Secondary | ICD-10-CM | POA: Diagnosis not present

## 2022-12-06 DIAGNOSIS — R918 Other nonspecific abnormal finding of lung field: Secondary | ICD-10-CM | POA: Diagnosis not present

## 2022-12-06 DIAGNOSIS — R609 Edema, unspecified: Secondary | ICD-10-CM

## 2022-12-06 DIAGNOSIS — M1711 Unilateral primary osteoarthritis, right knee: Secondary | ICD-10-CM | POA: Diagnosis not present

## 2022-12-06 DIAGNOSIS — Z86711 Personal history of pulmonary embolism: Secondary | ICD-10-CM

## 2022-12-06 DIAGNOSIS — R0789 Other chest pain: Secondary | ICD-10-CM | POA: Diagnosis not present

## 2022-12-06 LAB — BASIC METABOLIC PANEL
Anion gap: 12 (ref 5–15)
BUN: 13 mg/dL (ref 6–20)
CO2: 24 mmol/L (ref 22–32)
Calcium: 9.4 mg/dL (ref 8.9–10.3)
Chloride: 104 mmol/L (ref 98–111)
Creatinine, Ser: 0.9 mg/dL (ref 0.44–1.00)
GFR, Estimated: >60 mL/min (ref >60)
Glucose, Bld: 92 mg/dL (ref 70–99)
Potassium: 4 mmol/L (ref 3.5–5.1)
Sodium: 140 mmol/L (ref 135–145)

## 2022-12-06 LAB — CBC WITH DIFFERENTIAL/PLATELET
Abs Immature Granulocytes: 0.03 10*3/uL (ref 0.00–0.07)
Basophils Absolute: 0.1 10*3/uL (ref 0.0–0.1)
Basophils Relative: 1 %
Eosinophils Absolute: 0.1 10*3/uL (ref 0.0–0.5)
Eosinophils Relative: 1 %
HCT: 43.7 % (ref 36.0–46.0)
Hemoglobin: 14.1 g/dL (ref 12.0–15.0)
Immature Granulocytes: 0 %
Lymphocytes Relative: 30 %
Lymphs Abs: 2.9 10*3/uL (ref 0.7–4.0)
MCH: 26.1 pg (ref 26.0–34.0)
MCHC: 32.3 g/dL (ref 30.0–36.0)
MCV: 80.8 fL (ref 80.0–100.0)
Monocytes Absolute: 0.5 10*3/uL (ref 0.1–1.0)
Monocytes Relative: 5 %
Neutro Abs: 6 10*3/uL (ref 1.7–7.7)
Neutrophils Relative %: 63 %
Platelets: 206 10*3/uL (ref 150–400)
RBC: 5.41 MIL/uL — ABNORMAL HIGH (ref 3.87–5.11)
RDW: 15.7 % — ABNORMAL HIGH (ref 11.5–15.5)
WBC: 9.7 10*3/uL (ref 4.0–10.5)
nRBC: 0 % (ref 0.0–0.2)

## 2022-12-06 LAB — TROPONIN I (HIGH SENSITIVITY)
Troponin I (High Sensitivity): 3 ng/L (ref <18)
Troponin I (High Sensitivity): 3 ng/L (ref <18)

## 2022-12-06 LAB — PROTIME-INR
INR: 1.7 — ABNORMAL HIGH (ref 0.8–1.2)
Prothrombin Time: 19.7 seconds — ABNORMAL HIGH (ref 11.4–15.2)

## 2022-12-06 MED ORDER — LORAZEPAM 1 MG PO TABS
1.0000 mg | ORAL_TABLET | Freq: Once | ORAL | Status: AC
  Administered 2022-12-06: 1 mg via ORAL
  Filled 2022-12-06: qty 1

## 2022-12-06 MED ORDER — METHYLPREDNISOLONE SODIUM SUCC 40 MG IJ SOLR
40.0000 mg | Freq: Once | INTRAMUSCULAR | Status: AC
  Administered 2022-12-06: 40 mg via INTRAVENOUS
  Filled 2022-12-06: qty 1

## 2022-12-06 MED ORDER — MORPHINE SULFATE (PF) 4 MG/ML IV SOLN
4.0000 mg | Freq: Once | INTRAVENOUS | Status: AC
  Administered 2022-12-06: 4 mg via INTRAVENOUS
  Filled 2022-12-06: qty 1

## 2022-12-06 MED ORDER — OXYCODONE-ACETAMINOPHEN 5-325 MG PO TABS
1.0000 | ORAL_TABLET | Freq: Once | ORAL | Status: AC
  Administered 2022-12-06: 1 via ORAL
  Filled 2022-12-06: qty 1

## 2022-12-06 MED ORDER — DIPHENHYDRAMINE HCL 50 MG/ML IJ SOLN
50.0000 mg | Freq: Once | INTRAMUSCULAR | Status: AC
  Filled 2022-12-06: qty 1

## 2022-12-06 MED ORDER — DIPHENHYDRAMINE HCL 25 MG PO CAPS
50.0000 mg | ORAL_CAPSULE | Freq: Once | ORAL | Status: AC
  Administered 2022-12-07: 50 mg via ORAL
  Filled 2022-12-06: qty 2

## 2022-12-06 NOTE — ED Provider Triage Note (Signed)
Emergency Medicine Provider Triage Evaluation Note  Emily Rice , a 62 y.o. female  was evaluated in triage.  Pt complains of history of DVT and PE on warfarin who presents to the ED due to persistent right lower extremity pain.  Diagnosed with a DVT a few months ago in right lower extremity.  Has been compliant with her Coumadin.  Sent by MD for ultrasound. Admits to CP which she attributes to her A. Fib?  Review of Systems  Positive: Leg pain, CP Negative: fever  Physical Exam  BP 121/87   Pulse 63   Temp 99 F (37.2 C)   Resp 18   LMP  (LMP Unknown)   SpO2 99%  Gen:   Awake, no distress   Resp:  Normal effort  MSK:   Moves extremities without difficulty  Other:    Medical Decision Making  Medically screening exam initiated at 5:15 PM.  Appropriate orders placed.  Emily Rice was informed that the remainder of the evaluation will be completed by another provider, this initial triage assessment does not replace that evaluation, and the importance of remaining in the ED until their evaluation is complete.  Labs [place] to rule out DVT   Emily Rice 12/06/22 1717

## 2022-12-06 NOTE — ED Triage Notes (Signed)
Pt arrived POV reporting she was sent from MD. Hx od DVT and PE. Pt reports RLE pain from DVT that never went away completely, got worse recently. Pt MD called a few imaging places but they weren't able to accommodate today. Denies cp. States always feeling shob. No other symptoms

## 2022-12-06 NOTE — Progress Notes (Addendum)
Emily Rice D.[facility] [address] [place] [address] Phone: [phone]   Assessment and Plan:     1. Acute deep vein thrombosis (DVT) of popliteal vein of right lower extremity (HCC) 2. Paroxysmal atrial fibrillation (HCC) 3. History of DVT (deep vein thrombosis) 4. History of pulmonary embolism -Chronic with exacerbation, initial sports medicine visit - Unclear etiology of ongoing and worsening right lower extremity pain.  Concern for new acute DVT based on worsening pain through right lower extremity primarily in popliteal fossa, thigh, history of 3 confirmed DVTs, history of PE - Patient was a difficult historian, however it appears that patient has had a history of 3 confirmed DVTs and history of PE with most recent DVT confirmed 6 to 8 months ago.  She states that she was taking Eliquis at the time of most recent DVT and has since been switched back to warfarin.  She has a venous ultrasound from 09/07/2022 that showed no blood clot.  Based on patient's worsening symptoms that feels similar to prior DVT, normal venous ultrasound 2 months ago, I am concerned for a recurrent DVT.  We have ordered a stat venous ultrasound.  We called and spoke with technician who offered patient an 8 AM appointment tomorrow morning for evaluation.  Patient has no red flag signs and is anticoagulated on warfarin, so I do feel that that is a reasonable timeframe.  Patient stated that her husband could not take her to that appointments and asked to change location.  We reordered and change location of DVT ultrasound, though I cannot guarantee when they can get patient in to be seen.  Patient advised to go immediately to ER if she should start to have chest pain or shortness of breath - Patient states that her warfarin has been titrated by her PCP.  I believe patient would benefit from specialist recommendation.  Will refer to hematology oncology due to history of  multiple DVTs, PE, family history of DVT/stroke, suspicion for hypercoagulability.  Patient states that she saw hematology oncology in the past, though she cannot tell me where or when. - I do not think that warfarin is an appropriate medication for patient based on her self-admitted difficulty with memory.  I believe proper titrations would be difficult to manage for patient.  I will defer to specialist for further  care.  -After we had completed office visit, patient asked front office staff if I could reside "Percocet" for her pain.  Patient was informed that I would not prescribe opioid medication   Pertinent previous records reviewed include lab work from 07/24/2022, DVT ultrasound from 09/07/2022,   Follow Up: I will contact patient with DVT results to help dictate plan.   Subjective:   I, Emily Rice, am serving as a Neurosurgeon for Doctor Emily Rice  Chief Complaint: right thigh pain   HPI:   12/06/22 Patient is a 62 year old female complaining of right thigh pain. Patient states that this is the 3rd blood clot she has had its in the popliteal space. She still has pain from her DVT the pain radiates to the hamstring and down to her foot. Tylenol for the pain and that does not help . Pain with sitting she gets a shooting pain. She isnt able to sleep through the night due to pain through the whole leg     Relevant Historical Information: History of multiple DVTs, paroxysmal atrial fibrillation, history of PE, bipolar affective disorder, elevated CK,  history of TBI, family history of stroke/DVT  Additional pertinent review of systems negative.   Current Outpatient Medications:    acetaminophen (TYLENOL) 325 MG tablet, Take 2 tablets (650 mg total) by mouth every 6 (six) hours as needed for mild pain or moderate pain., Disp: , Rfl:    busPIRone (BUSPAR) 5 MG tablet, Take 1 tablet (5 mg total) by mouth 3 (three) times daily., Disp: 90 tablet, Rfl: 2   carbidopa-levodopa (SINEMET IR)  25-100 MG tablet, Take one tablet in the am and one tablet in the pm (Patient taking differently: Take 1 tablet by mouth 3 (three) times daily. Take one tablet in the am, one tablet in the afternoon, and one tablet in the pm), Disp: 60 tablet, Rfl: 4   cetirizine (ZYRTEC) 10 MG tablet, Take 10 mg by mouth daily as needed for allergies., Disp: , Rfl:    Cholecalciferol 75 MCG (3000 UT) TABS, Take 3,000 Units by mouth daily., Disp: 30 tablet, Rfl: 0   diazepam (VALIUM) 5 MG tablet, Take 1 tablet (5 mg total) by mouth 2 (two) times daily., Disp: 28 tablet, Rfl: 1   diltiazem (CARDIZEM CD) 360 MG 24 hr capsule, TAKE 1 CAPSULE (360 MG TOTAL) BY MOUTH EVERY MORNING., Disp: 90 capsule, Rfl: 0   diphenhydrAMINE (BENADRYL ALLERGY EXTRA STR) 50 MG tablet, Take 1 tablet (50 mg total) by mouth once for 1 dose. Take 1 tablet 1 hour prior to CT scan., Disp: 1 tablet, Rfl: 0   escitalopram (LEXAPRO) 10 MG tablet, TAKE 1 TABLET AT BEDTIME, Disp: 90 tablet, Rfl: 3   fexofenadine (ALLEGRA) 180 MG tablet, Take 180 mg by mouth as needed for allergies or rhinitis., Disp: , Rfl:    fluticasone (FLONASE) 50 MCG/ACT nasal spray, Place 1 spray into both nostrils daily as needed for allergies. , Disp: , Rfl:    lamoTRIgine (LAMICTAL) 200 MG tablet, Take 1 tablet (200 mg total) by mouth 2 (two) times daily., Disp: 180 tablet, Rfl: 0   lumateperone tosylate (CAPLYTA) 42 MG capsule, Take 1 capsule (42 mg total) by mouth daily., Disp: 90 capsule, Rfl: 0   Multiple Vitamin (MULTIVITAMIN WITH MINERALS) TABS tablet, Take 1 tablet by mouth daily., Disp: , Rfl:    ondansetron (ZOFRAN) 4 MG tablet, Take 4 mg by mouth every 6 (six) hours as needed for nausea/vomiting., Disp: , Rfl:    polyethylene glycol (MIRALAX / GLYCOLAX) 17 g packet, Take 17 g by mouth 2 (two) times daily. (Patient taking differently: Take 17 g by mouth daily as needed for mild constipation.), Disp: 14 each, Rfl: 0   predniSONE (DELTASONE) 50 MG tablet, Take 1 tablet  13 hours prior to CT scan, take 1 tablet 7 hours prior to CT scan, take 1 tablet 1 hour prior to CT scan., Disp: 3 tablet, Rfl: 0   QUEtiapine (SEROQUEL) 50 MG tablet, Take one tablet at bedtime., Disp: 90 tablet, Rfl: 1   warfarin (COUMADIN) 5 MG tablet, Take 5 mg by mouth daily., Disp: , Rfl:    Objective:     Vitals:   12/06/22 1432  BP: 102/72  Pulse: (!) 44  SpO2: 98%  Weight: 185 lb (83.9 kg)  Height: 5\' 3"  (1.6 m)      Body mass index is 32.77 kg/m.    Physical Exam:    General:  awake, alert oriented, no acute distress nontoxic Skin: no suspicious lesions or rashes Neuro:sensation intact and strength 5/5 with no deficits, no atrophy, normal muscle tone  Psych: No signs of anxiety, depression or other mood disorder  Right leg/knee: Swelling difficult to assess in thigh and knee due to body habitus ROM Flex 100, Ext 10 Nonspecific global TTP including anterior and posterior thigh, popliteal fossa, medial and lateral joint space Neg anterior and posterior drawer Neg lachman Neg sag sign Negative varus stress Negative valgus stress Negative McMurray   Gait normal    Electronically signed by:  Emily Rice D.[facility] 3:45 PM 12/06/22

## 2022-12-06 NOTE — Progress Notes (Signed)
RLE venous duplex was completed.  Preliminary results given to Claudette Stapler, PA-C.   Results can be found under chart review under CV PROC. 12/06/2022 7:17 PM Chasin Findling RVT, RDMS

## 2022-12-06 NOTE — Patient Instructions (Addendum)
DVT referral  Hematology referral  We will call you with results

## 2022-12-06 NOTE — ED Provider Notes (Signed)
[place] EMERGENCY DEPARTMENT AT [facility] Provider Note   CSN: [id number] Arrival date & time: 12/06/22  1618     History  Chief Complaint  Patient presents with   Leg Pain    Emily Rice is a 62 y.o. female.  Patient is a 62 year old female with a past medical history of prior DVT on Coumadin, A-fib, bipolar disorder presenting to the emergency department with right leg pain.  Patient states that since she had her last DVT several months ago she has continued to have pain in her right leg.  She states that it has been worsening which prompted her to be evaluated.  She states that she was seen by her doctor who wanted her to have a repeat DVT study however was unable to order her an outpatient 1 today and recommended that she come to the ER to be evaluated.  Patient initially reported to triage she was not having any chest pain or shortness of breath however was complaining of some chest pain to the provider in triage when she was screened.  She is no longer complaining of chest pain to me.  She denies any trauma or falls.  States that she has been taking Tylenol extra strength for pain but it was no longer helping so she stopped taking it.  The history is provided by the patient.  Leg Pain      Home Medications Prior to Admission medications   Medication Sig Start Date End Date Taking? Authorizing Provider  acetaminophen (TYLENOL) 325 MG tablet Take 2 tablets (650 mg total) by mouth every 6 (six) hours as needed for mild pain or moderate pain. 04/04/20   Angiulli, Mcarthur Rossetti, PA-C  busPIRone (BUSPAR) 5 MG tablet Take 1 tablet (5 mg total) by mouth 3 (three) times daily. 11/22/22   Mozingo, Thereasa Solo, NP  carbidopa-levodopa (SINEMET IR) 25-100 MG tablet Take one tablet in the am and one tablet in the pm Patient taking differently: Take 1 tablet by mouth 3 (three) times daily. Take one tablet in the am, one tablet in the afternoon, and one tablet in the pm 05/25/20    Ranelle Oyster, MD  cetirizine (ZYRTEC) 10 MG tablet Take 10 mg by mouth daily as needed for allergies.    [provider]  Cholecalciferol 75 MCG (3000 UT) TABS Take 3,000 Units by mouth daily. 04/04/20   Angiulli, Mcarthur Rossetti, PA-C  diazepam (VALIUM) 5 MG tablet Take 1 tablet (5 mg total) by mouth 2 (two) times daily. 11/22/22   Mozingo, Thereasa Solo, NP  diltiazem (CARDIZEM CD) 360 MG 24 hr capsule TAKE 1 CAPSULE (360 MG TOTAL) BY MOUTH EVERY MORNING. 08/20/22 02/16/23  Tolia, Sunit, DO  diphenhydrAMINE (BENADRYL ALLERGY EXTRA STR) 50 MG tablet Take 1 tablet (50 mg total) by mouth once for 1 dose. Take 1 tablet 1 hour prior to CT scan. 03/30/22 10/13/23  Lanier Prude, MD  escitalopram (LEXAPRO) 10 MG tablet TAKE 1 TABLET AT BEDTIME 10/31/22   Mozingo, Thereasa Solo, NP  fexofenadine (ALLEGRA) 180 MG tablet Take 180 mg by mouth as needed for allergies or rhinitis.    [provider]  fluticasone (FLONASE) 50 MCG/ACT nasal spray Place 1 spray into both nostrils daily as needed for allergies.     [provider]  lamoTRIgine (LAMICTAL) 200 MG tablet Take 1 tablet (200 mg total) by mouth 2 (two) times daily. 11/21/22   Mozingo, Thereasa Solo, NP  lumateperone tosylate (CAPLYTA) 42 MG  capsule Take 1 capsule (42 mg total) by mouth daily. 11/21/22   Mozingo, Thereasa Solo, NP  Multiple Vitamin (MULTIVITAMIN WITH MINERALS) TABS tablet Take 1 tablet by mouth daily. 04/05/20   Angiulli, Mcarthur Rossetti, PA-C  ondansetron (ZOFRAN) 4 MG tablet Take 4 mg by mouth every 6 (six) hours as needed for nausea/vomiting. 12/01/21   [provider]  polyethylene glycol (MIRALAX / GLYCOLAX) 17 g packet Take 17 g by mouth 2 (two) times daily. Patient taking differently: Take 17 g by mouth daily as needed for mild constipation. 04/04/20   Angiulli, Mcarthur Rossetti, PA-C  predniSONE (DELTASONE) 50 MG tablet Take 1 tablet 13 hours prior to CT scan, take 1 tablet 7 hours prior to CT scan, take 1  tablet 1 hour prior to CT scan. 03/30/22   Lanier Prude, MD  QUEtiapine (SEROQUEL) 50 MG tablet Take one tablet at bedtime. 11/21/22   Mozingo, Thereasa Solo, NP  warfarin (COUMADIN) 5 MG tablet Take 5 mg by mouth daily. 12/15/21   [provider]      Allergies    Hydromorphone, Iodinated contrast media, and Prochlorperazine    Review of Systems   Review of Systems  Physical Exam Updated Vital Signs BP 134/78   Pulse (!) 54   Temp 97.7 F (36.5 C) (Oral)   Resp 12   LMP  (LMP Unknown)   SpO2 98%  Physical Exam Vitals and nursing note reviewed.  Constitutional:      General: She is not in acute distress.    Appearance: Normal appearance.  HENT:     Head: Normocephalic and atraumatic.     Nose: Nose normal.     Mouth/Throat:     Mouth: Mucous membranes are moist.     Pharynx: Oropharynx is clear.  Eyes:     Extraocular Movements: Extraocular movements intact.     Conjunctiva/sclera: Conjunctivae normal.  Cardiovascular:     Rate and Rhythm: Normal rate and regular rhythm.     Pulses: Normal pulses.     Heart sounds: Normal heart sounds.  Pulmonary:     Effort: Pulmonary effort is normal.     Breath sounds: Normal breath sounds.  Abdominal:     General: Abdomen is flat.     Palpations: Abdomen is soft.     Tenderness: There is no abdominal tenderness.  Musculoskeletal:        General: Normal range of motion.     Cervical back: Normal range of motion.     Comments: No palpable knee effusion, no knee joint laxity on the R Compartments soft, no tenderness to palpation. Mildly increased pain with knee flexion.  Skin:    General: Skin is warm and dry.  Neurological:     General: No focal deficit present.     Mental Status: She is alert and oriented to person, place, and time.  Psychiatric:        Mood and Affect: Mood normal.        Behavior: Behavior normal.     ED Results / Procedures / Treatments   Labs (all labs ordered are listed, but only  abnormal results are displayed) Labs Reviewed  CBC WITH DIFFERENTIAL/PLATELET - Abnormal; Notable for the following components:      Result Value   RBC 5.41 (*)    RDW 15.7 (*)    All other components within normal limits  PROTIME-INR - Abnormal; Notable for the following components:   Prothrombin Time 19.7 (*)  INR 1.7 (*)    All other components within normal limits  BASIC METABOLIC PANEL  TROPONIN I (HIGH SENSITIVITY)  TROPONIN I (HIGH SENSITIVITY)    EKG EKG Interpretation Date/Time:  Thursday December 06 2022 17:36:28 EDT Ventricular Rate:  62 PR Interval:  132 QRS Duration:  100 QT Interval:  428 QTC Calculation: 435 R Axis:   76  Text Interpretation: Sinus rhythm Atrial premature complexes Low voltage, precordial leads PACs otherise unchanged from prior EKG Confirmed by Elayne Snare ([id number]) on 12/06/2022 8:42:12 PM  Radiology DG Knee 2 Views Right  Result Date: 12/06/2022 CLINICAL DATA:  Knee pain EXAM: RIGHT KNEE - 1-2 VIEW COMPARISON:  None Available. FINDINGS: Small joint effusion is likely present. Soft tissues are otherwise within normal limits. There is no acute fracture or dislocation. There is patellofemoral and medial compartment joint space narrowing and osteophyte formation compatible with degenerative change. IMPRESSION: Mild-to-moderate degenerative changes of the right knee. Electronically Signed   By: Darliss Cheney M.D.   On: 12/06/2022 23:09   DG Chest Portable 1 View  Result Date: 12/06/2022 CLINICAL DATA:  Right lower extremity pain and shortness of breath. EXAM: PORTABLE CHEST 1 VIEW COMPARISON:  December 26, 2021 FINDINGS: The heart size and mediastinal contours are within normal limits. Both lungs are clear. A radiopaque intramedullary rod and fixation screws are seen within the proximal right humerus. No acute osseous abnormalities are identified. IMPRESSION: No active cardiopulmonary disease. Electronically Signed   By: Aram Candela M.D.    On: 12/06/2022 20:20   VAS [place] LOWER EXTREMITY VENOUS (DVT) (7a-7p)  Result Date: 12/06/2022  Lower Venous DVT Study Patient Name:  LARAIB CASPAR  Date of Exam:   12/06/2022 Medical Rec #: [id number]        Accession #:    [id number] Date of Birth: Jul 17, 1960       Patient Gender: F Patient Age:   5 years Exam Location:  [facility] Procedure:      VAS [place] LOWER EXTREMITY VENOUS (DVT) Referring Phys: Claudette Stapler --------------------------------------------------------------------------------  Indications: Pain.  Risk Factors: HX of DVT & PE. Anticoagulation: Coumadin. Comparison Study: Previous exam on 09/07/2022 was negative for DVT Performing Technologist: Ernestene Mention RVT, RDMS  Examination Guidelines: A complete evaluation includes B-mode imaging, spectral Doppler, color Doppler, and power Doppler as needed of all accessible portions of each vessel. Bilateral testing is considered an integral part of a complete examination. Limited examinations for reoccurring indications may be performed as noted. The reflux portion of the exam is performed with the patient in reverse Trendelenburg.  +---------+---------------+---------+-----------+---------------+--------------+ RIGHT    CompressibilityPhasicitySpontaneityProperties     Thrombus Aging +---------+---------------+---------+-----------+---------------+--------------+ CFV      Full           Yes      Yes                                      +---------+---------------+---------+-----------+---------------+--------------+ SFJ      Full                                                             +---------+---------------+---------+-----------+---------------+--------------+ FV Prox  Full  Yes      Yes                                      +---------+---------------+---------+-----------+---------------+--------------+ FV Mid   Full           Yes      Yes                                       +---------+---------------+---------+-----------+---------------+--------------+ FV DistalFull           Yes      Yes                                      +---------+---------------+---------+-----------+---------------+--------------+ PFV      Full                                                             +---------+---------------+---------+-----------+---------------+--------------+ POP      Full           Yes      Yes        with striations               +---------+---------------+---------+-----------+---------------+--------------+ PTV      Full                                                             +---------+---------------+---------+-----------+---------------+--------------+ PERO     Full                                                             +---------+---------------+---------+-----------+---------------+--------------+   +----+---------------+---------+-----------+----------+--------------+ LEFTCompressibilityPhasicitySpontaneityPropertiesThrombus Aging +----+---------------+---------+-----------+----------+--------------+ CFV Full           Yes      Yes                                 +----+---------------+---------+-----------+----------+--------------+    Summary: RIGHT: - There is no evidence of deep vein thrombosis in the lower extremity.  - No cystic structure found in the popliteal fossa.  LEFT: - No evidence of common femoral vein obstruction.   *See table(s) above for measurements and observations.    Preliminary     Procedures Procedures    Medications Ordered in ED Medications  diphenhydrAMINE (BENADRYL) capsule 50 mg (has no administration in time range)    Or  diphenhydrAMINE (BENADRYL) injection 50 mg (has no administration in time range)  LORazepam (ATIVAN) tablet 1 mg (has no administration in time range)  oxyCODONE-acetaminophen (PERCOCET/ROXICET) 5-325 MG per tablet 1 tablet (1 tablet Oral Given 12/06/22 2156)   morphine (PF) 4 MG/ML injection 4  mg (4 mg Intravenous Given 12/06/22 2154)  methylPREDNISolone sodium succinate (SOLU-MEDROL) 40 mg/mL injection 40 mg (40 mg Intravenous Given 12/06/22 2235)    ED Course/ Medical Decision Making/ A&P Clinical Course as of 12/06/22 2342  Thu Dec 06, 2022  2216 INR mildly subtherapeutic. I spoke with pharmacy who recommended 7.5 mg today (additional 2.5 if already taken today) and then can take regular dose and follow up for recheck. [VK]  2218 Patient complained of chest pain again to RN. Will repeat EKG. Has had 2 previous normal troponins today. With history of VTE and subtherapeutic INR, will have CTPE. Has contrast dye allergy and will need prep. [VK]  2339 Patient signed out to Dr. Eudelia Bunch pending CTPE. [VK]    Clinical Course User Index [VK] Rexford Maus, DO                                 Medical Decision Making This patient presents to the ED with chief complaint(s) of RLE pain with pertinent past medical history of DVT and A fib on Coumadin, bipolar disorder which further complicates the presenting complaint. The complaint involves an extensive differential diagnosis and also carries with it a high risk of complications and morbidity.    The differential diagnosis includes DVT, muscle strain or spasm, arthritis, no trauma or falls making fracture dislocation unlikely, she is neurovascularly intact making neurovascular injury unlikely  Additional history obtained: Additional history obtained from N/A Records reviewed outpatient sports medicine records  ED Course and Reassessment: On patient's arrival she is hemodynamically stable in no acute distress.  Initially evaluated by provider in triage who ordered DVT ultrasound.  Patient was initially complaining of chest pain at that time and additionally had EKG and labs including troponin.  Patient's troponins have been negative x 2.  DVT study negative.  Chest x-ray without acute disease.   Patient's INR is slightly subtherapeutic.  Patient still complaining of significant pain in her left leg, mostly around knee and will have x-ray performed.  She was initially offered Percocet for pain but requested some IV pain medication.  She is unable to obtain NSAIDs due to anticoagulation and will be given a dose of morphine.  Independent labs interpretation:  The following labs were independently interpreted: Subtherapeutic INR otherwise within normal range  Independent visualization of imaging: - I independently visualized the following imaging with scope of interpretation limited to determining acute life threatening conditions related to emergency care: Chest x-ray, knee x-ray, which revealed knee osteoarthritis otherwise no acute disease    Amount and/or Complexity of Data Reviewed Radiology: ordered.  Risk Prescription drug management.          Final Clinical Impression(s) / ED Diagnoses Final diagnoses:  Osteoarthritis of right knee, unspecified osteoarthritis type  Right leg pain    Rx / DC Orders ED Discharge Orders     None         Rexford Maus, DO 12/06/22 2342

## 2022-12-07 ENCOUNTER — Emergency Department (HOSPITAL_COMMUNITY): Payer: Medicare PPO

## 2022-12-07 ENCOUNTER — Telehealth: Payer: Self-pay | Admitting: Adult Health

## 2022-12-07 ENCOUNTER — Telehealth: Payer: Self-pay | Admitting: Sports Medicine

## 2022-12-07 DIAGNOSIS — M1711 Unilateral primary osteoarthritis, right knee: Secondary | ICD-10-CM | POA: Diagnosis not present

## 2022-12-07 DIAGNOSIS — R918 Other nonspecific abnormal finding of lung field: Secondary | ICD-10-CM | POA: Diagnosis not present

## 2022-12-07 DIAGNOSIS — R0602 Shortness of breath: Secondary | ICD-10-CM | POA: Diagnosis not present

## 2022-12-07 MED ORDER — IOHEXOL 350 MG/ML SOLN
80.0000 mL | Freq: Once | INTRAVENOUS | Status: AC | PRN
  Administered 2022-12-07: 80 mL via INTRAVENOUS

## 2022-12-07 MED ORDER — SODIUM CHLORIDE (PF) 0.9 % IJ SOLN
INTRAMUSCULAR | Status: AC
  Filled 2022-12-07: qty 50

## 2022-12-07 MED ORDER — WARFARIN - PHYSICIAN DOSING INPATIENT: Freq: Every day | Status: DC

## 2022-12-07 MED ORDER — TRAMADOL HCL 50 MG PO TABS: 50.0000 mg | ORAL_TABLET | Freq: Two times a day (BID) | ORAL | 0 refills | Status: AC | PRN

## 2022-12-07 MED ORDER — WARFARIN SODIUM 7.5 MG PO TABS
7.5000 mg | ORAL_TABLET | Freq: Once | ORAL | Status: AC
  Administered 2022-12-07: 7.5 mg via ORAL
  Filled 2022-12-07: qty 1

## 2022-12-07 NOTE — Telephone Encounter (Signed)
Pt seen yesterday, went to ED at [facility] to get the testing we recommended. Wanted to be sure Dr. Jean Rosenthal saw this. Unsure if she follows up here or we would refer her elsewhere based on the results.

## 2022-12-07 NOTE — Telephone Encounter (Signed)
Pt lvm that she would like to talk to bridget or regina about her medications. Please call her at [phone]

## 2022-12-07 NOTE — Discharge Instructions (Addendum)
During the workup we noted incidental findings on your imaging that would require you to follow-up with your regular doctor for further evaluation/management: 4 mm subpleural nodular density in the left lower lobe. No   follow-up needed if patient is low-risk. Non-contrast chest CT can   be considered in 12 months if patient is high-risk. This  recommendation follows the consensus statement: Guidelines for  Management of Incidental Pulmonary Nodules Detected on CT Images:  From the Fleischner Society 2017; Radiology 2017; 284:228-243.

## 2022-12-07 NOTE — Telephone Encounter (Signed)
Patient called to say she needed something for anxiety in the afternoon. She describes manic sx of cleaning the house all day. She went to the ER because she thought she had a blood clot and said she was talking so much that the staff couldn't ask questions. I let her talk, did not offer any feedback. She wanted me to let you know how anxious she was in the afternoon and only has 5 mg Valium BID.

## 2022-12-10 NOTE — Telephone Encounter (Signed)
No answer and no way to leave a message

## 2022-12-11 DIAGNOSIS — Z7901 Long term (current) use of anticoagulants: Secondary | ICD-10-CM | POA: Diagnosis not present

## 2022-12-11 MED ORDER — BUSPIRONE HCL 10 MG PO TABS: 10.0000 mg | ORAL_TABLET | Freq: Three times a day (TID) | ORAL | 0 refills | Status: DC

## 2022-12-11 NOTE — Telephone Encounter (Signed)
Patient called back. She said her phone wasn't working yesterday, couldn't get or make calls. She has lost her Rx of Buspar. Ok to increase dose so will send in new Rx for 10 mg TID.

## 2022-12-11 NOTE — Addendum Note (Signed)
Addended by: Karin Lieu T on: 12/11/2022 11:30 AM   Modules accepted: Orders

## 2022-12-14 ENCOUNTER — Telehealth: Payer: Self-pay | Admitting: Hematology and Oncology

## 2022-12-14 ENCOUNTER — Telehealth: Payer: Self-pay | Admitting: *Deleted

## 2022-12-14 NOTE — Telephone Encounter (Signed)
Left patient a message regarding next follow up with [facility] per staff message

## 2022-12-15 ENCOUNTER — Other Ambulatory Visit: Payer: Self-pay | Admitting: Adult Health

## 2022-12-15 DIAGNOSIS — F310 Bipolar disorder, current episode hypomanic: Secondary | ICD-10-CM

## 2022-12-19 DIAGNOSIS — E8889 Other specified metabolic disorders: Secondary | ICD-10-CM | POA: Diagnosis not present

## 2022-12-19 DIAGNOSIS — Z1331 Encounter for screening for depression: Secondary | ICD-10-CM | POA: Diagnosis not present

## 2022-12-19 DIAGNOSIS — Z86711 Personal history of pulmonary embolism: Secondary | ICD-10-CM | POA: Diagnosis not present

## 2022-12-19 DIAGNOSIS — I480 Paroxysmal atrial fibrillation: Secondary | ICD-10-CM | POA: Diagnosis not present

## 2022-12-19 DIAGNOSIS — E66811 Obesity, class 1: Secondary | ICD-10-CM | POA: Diagnosis not present

## 2022-12-19 DIAGNOSIS — N1831 Chronic kidney disease, stage 3a: Secondary | ICD-10-CM | POA: Diagnosis not present

## 2022-12-19 DIAGNOSIS — Z6831 Body mass index (BMI) 31.0-31.9, adult: Secondary | ICD-10-CM | POA: Diagnosis not present

## 2022-12-19 DIAGNOSIS — R7303 Prediabetes: Secondary | ICD-10-CM | POA: Diagnosis not present

## 2022-12-25 NOTE — Telephone Encounter (Signed)
Error. Nam Vossler M Aleka Twitty, RN  

## 2022-12-26 ENCOUNTER — Ambulatory Visit: Payer: Medicare PPO | Admitting: Hematology and Oncology

## 2022-12-26 DIAGNOSIS — I480 Paroxysmal atrial fibrillation: Secondary | ICD-10-CM | POA: Diagnosis not present

## 2022-12-26 DIAGNOSIS — D6869 Other thrombophilia: Secondary | ICD-10-CM | POA: Diagnosis not present

## 2022-12-26 DIAGNOSIS — R079 Chest pain, unspecified: Secondary | ICD-10-CM | POA: Diagnosis not present

## 2022-12-26 DIAGNOSIS — R0602 Shortness of breath: Secondary | ICD-10-CM | POA: Diagnosis not present

## 2022-12-27 ENCOUNTER — Telehealth: Payer: Self-pay | Admitting: Cardiology

## 2022-12-27 ENCOUNTER — Encounter: Payer: Self-pay | Admitting: Cardiology

## 2022-12-27 ENCOUNTER — Other Ambulatory Visit: Payer: Self-pay | Admitting: Internal Medicine

## 2022-12-27 DIAGNOSIS — Z1231 Encounter for screening mammogram for malignant neoplasm of breast: Secondary | ICD-10-CM

## 2022-12-27 NOTE — Telephone Encounter (Signed)
Spoke with Pt. Pt states she has been symptomatic the past few months. Yesterday at the dentist she started to have chest pain that moved to the front of her neck. They took her BP it was 157/80 followed by 171/117. Dentist took her next door to [place] (possibly a primary care) where her BP decreased and a EKG read normal sinus rhythm. She is stating that she is having around 3 episodes a week. Pt said Dr Lalla Brothers had told her they were going to "hold off on the surgery". Pt stated she is having no symptoms at the time of the call. Told pt I would forward to both DrLambert and DrTolia for review and gave 991/ED precautions.

## 2022-12-27 NOTE — Telephone Encounter (Signed)
Patient c/o Palpitations:  High priority if patient c/o lightheadedness, shortness of breath, or chest pain  How long have you had palpitations/irregular HR/ Afib? Are you having the symptoms now? Since 2014 - but it has increased in the past couple of weeks   Are you currently experiencing lightheadedness, SOB or CP? No   Do you have a history of afib (atrial fibrillation) or irregular heart rhythm? Yes   Have you checked your BP or HR? (document readings if available): BP is elevated 157/80 // 171/117 when checked at dentist 10/16   Are you experiencing any other symptoms? Chest pressure going into neck. Not currently having this symptom.   Patient is requesting call back to discuss what Dr. Odis Hollingshead would suggest for next steps. Please advise.

## 2022-12-28 ENCOUNTER — Telehealth: Payer: Self-pay

## 2022-12-28 NOTE — Telephone Encounter (Signed)
Pt called and states she needs to speak with Dr Al Pimple regarding pain in lower extremity as it relates to her DVT.  Called pt to further discuss and validate her concerns as well as need for her to discuss these issues further with Dr Maurine Cane per her active care with managing the coumadin.  She states she has had ongoing pain to her RLE and would like to consult Dr Al Pimple with the pain, as she states she is not receiving the care she would hope for from her PCP. Per Dr Al Pimple, pt needs to follow up with primary MD per pain issues as pt was seen here for coag work up consult only. Doppler from 9/24 demonstrates there is no DVT.  Pt appears to have reached out to cardiology 10/17 regarding pain to her chest and neck and was advised to go to ED per Dr Odis Hollingshead.  Advised pt she should go to ED for further evaluation.   Pt continues to state Dr Jacqulyn Bath has not been helpful with her pain concerns and then states "so if she is not going to look after this issue for me then I just do not need to see her anymore."  I requested clarification on which provider pt was referring to and she yelled, "Dr Al Pimple are you not listening?"   This nurse kindly asked pt to not yell at me and warned I would end the call if she does again.   She once again yells, "I am in pain and I am a grown woman. Do not get smart with me little girl!"  This nurse stated, Ms Tremonti I am going to end the call since you continue to yell at me. We recommend you visit the ED for further evaluation of your pain.  Call ended.

## 2022-12-28 NOTE — Telephone Encounter (Signed)
Agree:   If she has acute symptoms / progressive she needs to go to ER.   Have her keep a log of her BP and pulse at home - will leave BP management to PCP.   Its been more than 1 years since the last OV and 2 years since her last stress test.   Have her come see me / APP / DOD / me on DOD if appts open.   Hope this helps.   Shamiya Demeritt [place], DO, [facility]

## 2022-12-31 ENCOUNTER — Telehealth: Payer: Self-pay

## 2022-12-31 NOTE — Telephone Encounter (Signed)
This RN received VM from pt stating that she would like to rescheduled his appt for Wednesday 01/02/23. This RN called pt back to inform her that she is now on the schedule for 01/02/23 for 3:45 pm. Pt verbalized understanding.

## 2023-01-02 ENCOUNTER — Other Ambulatory Visit: Payer: Self-pay

## 2023-01-02 ENCOUNTER — Other Ambulatory Visit: Payer: Self-pay | Admitting: Adult Health

## 2023-01-02 ENCOUNTER — Ambulatory Visit: Payer: Medicare PPO | Admitting: Hematology and Oncology

## 2023-01-02 ENCOUNTER — Inpatient Hospital Stay: Payer: Medicare PPO | Attending: Hematology and Oncology | Admitting: Hematology and Oncology

## 2023-01-02 ENCOUNTER — Other Ambulatory Visit (HOSPITAL_COMMUNITY): Payer: Self-pay

## 2023-01-02 VITALS — BP 149/73 | HR 78 | Temp 97.9°F | Resp 18 | Wt 181.9 lb

## 2023-01-02 DIAGNOSIS — Z6831 Body mass index (BMI) 31.0-31.9, adult: Secondary | ICD-10-CM | POA: Diagnosis not present

## 2023-01-02 DIAGNOSIS — Z86711 Personal history of pulmonary embolism: Secondary | ICD-10-CM | POA: Diagnosis not present

## 2023-01-02 DIAGNOSIS — E66811 Obesity, class 1: Secondary | ICD-10-CM | POA: Diagnosis not present

## 2023-01-02 DIAGNOSIS — Z86718 Personal history of other venous thrombosis and embolism: Secondary | ICD-10-CM | POA: Insufficient documentation

## 2023-01-02 DIAGNOSIS — R7303 Prediabetes: Secondary | ICD-10-CM | POA: Diagnosis not present

## 2023-01-02 DIAGNOSIS — Z7901 Long term (current) use of anticoagulants: Secondary | ICD-10-CM | POA: Diagnosis not present

## 2023-01-02 DIAGNOSIS — I82401 Acute embolism and thrombosis of unspecified deep veins of right lower extremity: Secondary | ICD-10-CM

## 2023-01-02 DIAGNOSIS — N1831 Chronic kidney disease, stage 3a: Secondary | ICD-10-CM | POA: Diagnosis not present

## 2023-01-02 DIAGNOSIS — I480 Paroxysmal atrial fibrillation: Secondary | ICD-10-CM | POA: Diagnosis not present

## 2023-01-02 MED ORDER — WEGOVY 0.25 MG/0.5ML ~~LOC~~ SOAJ
0.2500 mg | SUBCUTANEOUS | 0 refills | Status: DC
  Filled 2023-01-02: qty 2, 28d supply, fill #0

## 2023-01-02 NOTE — Telephone Encounter (Signed)
Patient states she calling back for update. Please advise
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
## 2023-01-02 NOTE — Telephone Encounter (Signed)
Left a message for the pt to call back.  
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
## 2023-01-03 ENCOUNTER — Telehealth: Payer: Self-pay | Admitting: Adult Health

## 2023-01-03 ENCOUNTER — Other Ambulatory Visit (HOSPITAL_COMMUNITY): Payer: Self-pay

## 2023-01-03 DIAGNOSIS — F310 Bipolar disorder, current episode hypomanic: Secondary | ICD-10-CM

## 2023-01-03 MED ORDER — BUSPIRONE HCL 10 MG PO TABS: 10.0000 mg | ORAL_TABLET | Freq: Three times a day (TID) | ORAL | 0 refills | Status: DC

## 2023-01-03 MED ORDER — QUETIAPINE FUMARATE 50 MG PO TABS: ORAL_TABLET | ORAL | 0 refills | Status: DC

## 2023-01-03 NOTE — Telephone Encounter (Signed)
Pt called at 11:53a requesting she needs the Buspar to go to [facility] as she is tolerating it well.  She is also asking for the Quetiapine 50mg  to goto [facility] also.  Pt said she has ha a few nights of being very anxious and unable to sleep.  She was up to 4am this morning.  She said she normally wakes up at about 6am and has a full day but instead today she got up at 11.  She said that is unusual for her.  Next appt 10/31

## 2023-01-03 NOTE — Telephone Encounter (Signed)
Sent!

## 2023-01-04 ENCOUNTER — Other Ambulatory Visit (HOSPITAL_COMMUNITY): Payer: Self-pay

## 2023-01-06 ENCOUNTER — Encounter: Payer: Self-pay | Admitting: Cardiology

## 2023-01-08 ENCOUNTER — Other Ambulatory Visit: Payer: Self-pay | Admitting: Adult Health
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
## 2023-01-09 ENCOUNTER — Other Ambulatory Visit: Payer: Self-pay | Admitting: *Deleted

## 2023-01-09 DIAGNOSIS — I824Y1 Acute embolism and thrombosis of unspecified deep veins of right proximal lower extremity: Secondary | ICD-10-CM

## 2023-01-09 DIAGNOSIS — R52 Pain, unspecified: Secondary | ICD-10-CM

## 2023-01-10 ENCOUNTER — Telehealth: Payer: Self-pay | Admitting: *Deleted

## 2023-01-10 ENCOUNTER — Encounter: Payer: Self-pay | Admitting: Adult Health

## 2023-01-10 ENCOUNTER — Telehealth: Payer: Self-pay | Admitting: Adult Health

## 2023-01-10 ENCOUNTER — Ambulatory Visit: Payer: Medicare PPO | Admitting: Adult Health

## 2023-01-10 DIAGNOSIS — G4700 Insomnia, unspecified: Secondary | ICD-10-CM

## 2023-01-10 DIAGNOSIS — Z7901 Long term (current) use of anticoagulants: Secondary | ICD-10-CM | POA: Diagnosis not present

## 2023-01-10 DIAGNOSIS — F4310 Post-traumatic stress disorder, unspecified: Secondary | ICD-10-CM | POA: Diagnosis not present

## 2023-01-10 DIAGNOSIS — F09 Unspecified mental disorder due to known physiological condition: Secondary | ICD-10-CM | POA: Diagnosis not present

## 2023-01-10 DIAGNOSIS — F310 Bipolar disorder, current episode hypomanic: Secondary | ICD-10-CM | POA: Diagnosis not present

## 2023-01-10 DIAGNOSIS — I82431 Acute embolism and thrombosis of right popliteal vein: Secondary | ICD-10-CM | POA: Diagnosis not present

## 2023-01-10 DIAGNOSIS — F411 Generalized anxiety disorder: Secondary | ICD-10-CM | POA: Diagnosis not present

## 2023-01-10 DIAGNOSIS — I480 Paroxysmal atrial fibrillation: Secondary | ICD-10-CM | POA: Diagnosis not present

## 2023-01-10 MED ORDER — DIAZEPAM 5 MG PO TABS
5.0000 mg | ORAL_TABLET | Freq: Two times a day (BID) | ORAL | 0 refills | Status: DC
Start: 2023-01-10 — End: 2023-02-11

## 2023-01-10 MED ORDER — DIAZEPAM 5 MG PO TABS
5.0000 mg | ORAL_TABLET | Freq: Two times a day (BID) | ORAL | 0 refills | Status: DC
Start: 2023-01-10 — End: 2023-01-10

## 2023-01-10 NOTE — Telephone Encounter (Signed)
Please see message from patient. I have not called her since she was just seen today and would have thought she would have discussed with you.

## 2023-01-10 NOTE — Telephone Encounter (Signed)
Left message for patient to call back.   Need to know an update about seeing her periodontist for an infection which is why ablation was cancelled.

## 2023-01-10 NOTE — Telephone Encounter (Signed)
Emily Rice just checked out today and wanted to let you know that she has a big concern about the highs and lows.  Please call to discuss.  Next appt 03/14/23
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
## 2023-01-10 NOTE — Telephone Encounter (Signed)
This RN placed referrals per MD request to [facility] and to PalliativeCare as well.

## 2023-01-11 ENCOUNTER — Other Ambulatory Visit: Payer: Self-pay | Admitting: Adult Health

## 2023-01-11 DIAGNOSIS — F310 Bipolar disorder, current episode hypomanic: Secondary | ICD-10-CM

## 2023-01-11 MED ORDER — LUMATEPERONE TOSYLATE 42 MG PO CAPS
42.0000 mg | ORAL_CAPSULE | Freq: Every day | ORAL | 0 refills | Status: DC
Start: 2023-01-11 — End: 2023-07-26

## 2023-01-11 NOTE — Telephone Encounter (Signed)
Pt LVM @ 1:35p.  She thought she had script of Caplyta with [facility] but did not.  She has 5 days of meds left and she's asking to have this month's script sent to   [id number] [place], [facility] - [address] [place], [place] [address] Phone: [phone]  Fax: [phone]   Then send a script for next month to [facility].  Next appt 1/2

## 2023-01-15 ENCOUNTER — Other Ambulatory Visit: Payer: Self-pay

## 2023-01-15 ENCOUNTER — Telehealth: Payer: Self-pay | Admitting: Nurse Practitioner

## 2023-01-16 NOTE — Telephone Encounter (Signed)
Spoke with the patient who states that she has  been having issues with A Fib and is interested in having her afib ablation rescheduled. Patient does not want to have it done until next year. She last saw Dr. Lalla Brothers in July and ablation was cancelled due to infection in her gums. Patient states that she saw the periodontist a couple of weeks ago and she actually does not have an infection. I have scheduled her for an appointment with a APP in 12/19 - she did not want a sooner appointment.

## 2023-01-21 ENCOUNTER — Telehealth: Payer: Self-pay

## 2023-01-21 NOTE — Telephone Encounter (Signed)
This patient called symptom management directly and the message was forwarded to Dr. Remonia Richter nurse.  The patient thought she was calling the "pain management" department and was referred there.  She stated she did not know what the appointment was for.  I attempted to call the patient back but received her voicemail. Referral to palliative care sent on 10/31 and scheduler called with appts on 11/5. Routing to palliative team and copying in the pod. Lorayne Marek, RN

## 2023-01-22 ENCOUNTER — Inpatient Hospital Stay: Payer: Medicare PPO | Admitting: Nurse Practitioner

## 2023-01-22 ENCOUNTER — Ambulatory Visit: Payer: Medicare PPO

## 2023-01-22 ENCOUNTER — Telehealth: Payer: Self-pay

## 2023-01-22 NOTE — Telephone Encounter (Signed)
Patient called and LVM reporting that she does not want palliative care at this time and wishes to cancel her appt on 11/12. RN attempted to call pt back, no answer LVM and call back number. Palliative care available upon re-consult by provider.

## 2023-01-23 ENCOUNTER — Telehealth: Payer: Self-pay | Admitting: Nurse Practitioner

## 2023-01-23 ENCOUNTER — Encounter (INDEPENDENT_AMBULATORY_CARE_PROVIDER_SITE_OTHER): Payer: Self-pay | Admitting: Physician Assistant

## 2023-01-23 ENCOUNTER — Ambulatory Visit (INDEPENDENT_AMBULATORY_CARE_PROVIDER_SITE_OTHER): Payer: Medicare PPO | Admitting: Physician Assistant

## 2023-01-23 VITALS — BP 121/69 | HR 51 | Temp 98.6°F | Ht 63.0 in | Wt 177.0 lb

## 2023-01-23 DIAGNOSIS — Z86718 Personal history of other venous thrombosis and embolism: Secondary | ICD-10-CM

## 2023-01-23 DIAGNOSIS — F310 Bipolar disorder, current episode hypomanic: Secondary | ICD-10-CM

## 2023-01-23 DIAGNOSIS — R748 Abnormal levels of other serum enzymes: Secondary | ICD-10-CM | POA: Diagnosis not present

## 2023-01-23 DIAGNOSIS — Z86711 Personal history of pulmonary embolism: Secondary | ICD-10-CM

## 2023-01-23 DIAGNOSIS — R7401 Elevation of levels of liver transaminase levels: Secondary | ICD-10-CM | POA: Diagnosis not present

## 2023-01-23 DIAGNOSIS — Z79899 Other long term (current) drug therapy: Secondary | ICD-10-CM | POA: Diagnosis not present

## 2023-01-23 DIAGNOSIS — E785 Hyperlipidemia, unspecified: Secondary | ICD-10-CM

## 2023-01-23 DIAGNOSIS — I480 Paroxysmal atrial fibrillation: Secondary | ICD-10-CM | POA: Diagnosis not present

## 2023-01-23 DIAGNOSIS — S066X1S Traumatic subarachnoid hemorrhage with loss of consciousness of 30 minutes or less, sequela: Secondary | ICD-10-CM

## 2023-01-23 DIAGNOSIS — R52 Pain, unspecified: Secondary | ICD-10-CM

## 2023-01-23 NOTE — Telephone Encounter (Signed)
Referral received for pain management after discussions with Dr. Al Pimple. Patient cancelled scheduled appointment. I spoke with Mrs. Emily Rice via phone and offered support. Education provided on referral included palliative's role in collaboration with Oncology/Hematology team. Patient verbalized understanding sharing her familiarity with reasoning of referral as discussed with Dr. Al Pimple and years of Oncology nursing experience. She confirms pain in lower extremity. Mrs. Emily Rice states she would like to trial continued use of ibuprofen although aware that frequent use is not recommended in addition to use of compression hose for pain and support. If no improvement in her discomfort would then wish to seek additional support from palliative to include prescription medication. Patient acknowledges pain relief with use of opioids however declines to reschedule at this time. Writer acknowledges request. Patient has been provided with contact information and aware she may contact scheduler over the next month if patient feels that palliative consultation is warranted. Mrs. Emily Rice verbalized understanding and appreciation of follow-up phone call and clarification. Dr. Al Pimple and team notified via epic messaging.
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
## 2023-01-24 ENCOUNTER — Telehealth: Payer: Self-pay | Admitting: Cardiology

## 2023-01-24 NOTE — Telephone Encounter (Signed)
Spoke with patient and she states she has been going in and out of AFIB since 11/3. She states she noticed her HR has been all over the place from low  as high 30's to 140's. She denies any chest pain, headache, nausea or vomiting. She states she does experience SOB but that is not new. She his concerned because her HR is fluctuating even at rest. HR currently 55. Will forward ti provider.

## 2023-01-24 NOTE — Telephone Encounter (Signed)
Patient c/o Palpitations:  STAT if patient reporting lightheadedness, shortness of breath, or chest pain  How long have you had palpitations/irregular HR/ Afib? Are you having the symptoms now? Since 11/3, yes  Are you currently experiencing lightheadedness, SOB or CP? Some sob  Do you have a history of afib (atrial fibrillation) or irregular heart rhythm? Yes afib   Have you checked your BP or HR? (document readings if available): HR 163  Are you experiencing any other symptoms? No

## 2023-01-25 NOTE — Telephone Encounter (Signed)
She thinks she is in Afib or she has kardia or apple iwatch that has identified it.   She is taking her cardizem 360mg  daily.   What is her BP during these events or otherwise.   How long do these episodes last? How frequent?  Emily Leeson [place], DO, [facility]

## 2023-01-25 NOTE — Telephone Encounter (Signed)
Left voicemail to return call to office.

## 2023-01-25 NOTE — Telephone Encounter (Signed)
Spoke with patient and she is taking her cardizem. She states she does have the watch that is telling her she is in AFIB but she states she can feel it has well. She states her heart rate is all over place and it keeps fluctuating. Currently asymptomatic. HR currently 87

## 2023-01-25 NOTE — Telephone Encounter (Signed)
Patient is returning call.  °

## 2023-01-28 NOTE — Telephone Encounter (Signed)
She is rate controlled.  Has an appt w/ Renee in the coming weeks.   Tysheem Accardo [place], DO, [facility]

## 2023-01-30 ENCOUNTER — Telehealth: Payer: Self-pay | Admitting: Cardiology

## 2023-01-30 NOTE — Telephone Encounter (Signed)
Patient is calling with questions in regards to her upcoming appointment on 02/13/23. Please advise.

## 2023-01-30 NOTE — Telephone Encounter (Signed)
Spoke with the patient and she will keep follow up with Renee on 12/4. No further questions or concerns.

## 2023-01-30 NOTE — Telephone Encounter (Signed)
Pt left VM yesterday pm stating she had declined seeing the pain medication provider at the time they called - but now her pain is returning and she would like to see them.She stated she would give them  a call.  Today VM received again stating she does not have their number.  This RN will forward this note to [facility] for scheduling an appt.

## 2023-01-31 ENCOUNTER — Telehealth: Payer: Self-pay | Admitting: Nurse Practitioner

## 2023-02-06 ENCOUNTER — Telehealth: Payer: Self-pay | Admitting: *Deleted

## 2023-02-06 ENCOUNTER — Encounter (HOSPITAL_COMMUNITY): Admission: RE | Payer: Self-pay | Source: Home / Self Care

## 2023-02-06 ENCOUNTER — Ambulatory Visit (HOSPITAL_COMMUNITY): Admit: 2023-02-06 | Payer: Medicare PPO | Admitting: Obstetrics and Gynecology

## 2023-02-06 ENCOUNTER — Ambulatory Visit
Admission: RE | Admit: 2023-02-06 | Discharge: 2023-02-06 | Disposition: A | Discharge status: Home / Self Care | Payer: Medicare PPO | Source: Ambulatory Visit | Attending: Internal Medicine

## 2023-02-06 DIAGNOSIS — Z86711 Personal history of pulmonary embolism: Secondary | ICD-10-CM | POA: Diagnosis not present

## 2023-02-06 DIAGNOSIS — Z1231 Encounter for screening mammogram for malignant neoplasm of breast: Secondary | ICD-10-CM

## 2023-02-06 DIAGNOSIS — L819 Disorder of pigmentation, unspecified: Secondary | ICD-10-CM | POA: Diagnosis not present

## 2023-02-06 DIAGNOSIS — L700 Acne vulgaris: Secondary | ICD-10-CM | POA: Diagnosis not present

## 2023-02-06 DIAGNOSIS — Z7901 Long term (current) use of anticoagulants: Secondary | ICD-10-CM | POA: Diagnosis not present

## 2023-02-06 SURGERY — DILATATION & CURETTAGE/HYSTEROSCOPY WITH MYOSURE
Anesthesia: Choice

## 2023-02-06 NOTE — Telephone Encounter (Signed)
VM left by pt on this RN's line stating she needs to reschedule her appt with pain management but does not have their number.   She states issues with obtaining transportation on 12/4.  Return call number given as [phone].  This message will be forwarded to Palliative Care pod for appropriate follow up.

## 2023-02-08 NOTE — Progress Notes (Unsigned)
Palliative Medicine [facility]  [phone] [phone]   Name: DORCIE LANSER Date: 02/08/2023 MRN: [id number]  DOB: 1960-04-13  Patient Care Team: Ollen Bowl, MD as PCP - General (Internal Medicine) Lanier Prude, MD as PCP - Electrophysiology (Cardiology) Tessa Lerner, DO as PCP - Cardiology (Cardiology) Ellis Savage, NP as Nurse Practitioner (Psychiatry) Steva Ready, DO (Obstetrics and Gynecology)    REASON FOR CONSULTATION: Emily Rice is a 62 y.o. female with oncologic medical history including hematologic medical history including history of recurrent DVT and PE most recently had an acute popliteal DVT of the right lower extremity now on anticoagulation with Lovenox. Palliative ask to see for symptom and pain management and goals of care   SOCIAL HISTORY:     reports that she has never smoked. She has never used smokeless tobacco. She reports that she does not currently use alcohol after a past usage of about 2.0 standard drinks of alcohol per week. She reports that she does not use drugs.  ADVANCE DIRECTIVES:  None on file  CODE STATUS: Full code  PAST MEDICAL HISTORY: Past Medical History:  Diagnosis Date   A-fib (HCC)    Anxiety    Bipolar disorder (HCC)    Depression    DVT (deep vein thrombosis) in pregnancy    DVT (deep venous thrombosis) (HCC) 2001 and 2002   DVT (deep venous thrombosis) (HCC)    x 2   Migraines    Pulmonary embolism (HCC) 2001   Tremor     PAST SURGICAL HISTORY:  Past Surgical History:  Procedure Laterality Date   CHOLECYSTECTOMY  2014   CHOLECYSTECTOMY     HUMERUS IM NAIL Right 03/03/2020   Procedure: INTRAMEDULLARY (IM) NAIL HUMERAL;  Surgeon: Roby Lofts, MD;  Location: [facility] OR;  Service: Orthopedics;  Laterality: Right;   OVARIAN CYST REMOVAL     TONSILLECTOMY      HEMATOLOGY/ONCOLOGY HISTORY:  Oncology History   No history exists.    ALLERGIES:  is allergic  to hydromorphone, iodinated contrast media, and prochlorperazine.  MEDICATIONS:  Current Outpatient Medications  Medication Sig Dispense Refill   acetaminophen (TYLENOL) 325 MG tablet Take 2 tablets (650 mg total) by mouth every 6 (six) hours as needed for mild pain or moderate pain.     busPIRone (BUSPAR) 10 MG tablet Take 1 tablet (10 mg total) by mouth 3 (three) times daily. 270 tablet 0   carbidopa-levodopa (SINEMET IR) 25-100 MG tablet Take one tablet in the am and one tablet in the pm (Patient taking differently: Take 1 tablet by mouth 3 (three) times daily. Take one tablet in the am, one tablet in the afternoon, and one tablet in the pm) 60 tablet 4   cetirizine (ZYRTEC) 10 MG tablet Take 10 mg by mouth daily as needed for allergies.     Cholecalciferol 75 MCG (3000 UT) TABS Take 3,000 Units by mouth daily. 30 tablet 0   diazepam (VALIUM) 5 MG tablet Take 1 tablet (5 mg total) by mouth 2 (two) times daily. 14 tablet 0   diltiazem (CARDIZEM CD) 360 MG 24 hr capsule TAKE 1 CAPSULE (360 MG TOTAL) BY MOUTH EVERY MORNING. 90 capsule 0   diphenhydrAMINE (BENADRYL ALLERGY EXTRA STR) 50 MG tablet Take 1 tablet (50 mg total) by mouth once for 1 dose. Take 1 tablet 1 hour prior to CT scan. 1 tablet 0   escitalopram (LEXAPRO) 10 MG tablet TAKE 1 TABLET AT  BEDTIME 90 tablet 3   fexofenadine (ALLEGRA) 180 MG tablet Take 180 mg by mouth as needed for allergies or rhinitis.     fluticasone (FLONASE) 50 MCG/ACT nasal spray Place 1 spray into both nostrils daily as needed for allergies.      lamoTRIgine (LAMICTAL) 200 MG tablet TAKE 1 TABLET TWICE DAILY 180 tablet 0   lumateperone tosylate (CAPLYTA) 42 MG capsule TAKE 1 CAPSULE EVERY DAY 90 capsule 0   lumateperone tosylate (CAPLYTA) 42 MG capsule Take 1 capsule (42 mg total) by mouth daily. 30 capsule 0   Multiple Vitamin (MULTIVITAMIN WITH MINERALS) TABS tablet Take 1 tablet by mouth daily.     polyethylene glycol (MIRALAX / GLYCOLAX) 17 g packet Take 17  g by mouth 2 (two) times daily. (Patient taking differently: Take 17 g by mouth daily as needed for mild constipation.) 14 each 0   predniSONE (DELTASONE) 50 MG tablet Take 1 tablet 13 hours prior to CT scan, take 1 tablet 7 hours prior to CT scan, take 1 tablet 1 hour prior to CT scan. 3 tablet 0   QUEtiapine (SEROQUEL) 50 MG tablet Take one tablet at bedtime. 90 tablet 0   Semaglutide-Weight Management (WEGOVY) 0.25 MG/0.5ML SOAJ Inject 0.25 mg into the skin every 7 (seven) days. 2 mL 0   warfarin (COUMADIN) 5 MG tablet Take 5 mg by mouth daily.     No current facility-administered medications for this visit.    VITAL SIGNS: LMP  (LMP Unknown)  There were no vitals filed for this visit.  Estimated body mass index is 31.35 kg/m as calculated from the following:   Height as of 01/23/23: 5\' 3"  (1.6 m).   Weight as of 01/23/23: 177 lb (80.3 kg).  LABS: CBC:    Component Value Date/Time   WBC 9.7 12/06/2022 1742   HGB 14.1 12/06/2022 1742   HGB 13.0 04/27/2022 0735   HCT 43.7 12/06/2022 1742   HCT 39.7 04/27/2022 0735   PLT 206 12/06/2022 1742   PLT 239 04/27/2022 0735   MCV 80.8 12/06/2022 1742   MCV 80 04/27/2022 0735   NEUTROABS 6.0 12/06/2022 1742   LYMPHSABS 2.9 12/06/2022 1742   MONOABS 0.5 12/06/2022 1742   EOSABS 0.1 12/06/2022 1742   BASOSABS 0.1 12/06/2022 1742   Comprehensive Metabolic Panel:    Component Value Date/Time   NA 140 12/06/2022 1742   NA 142 07/24/2022 0856   K 4.0 12/06/2022 1742   CL 104 12/06/2022 1742   CO2 24 12/06/2022 1742   BUN 13 12/06/2022 1742   BUN 12 07/24/2022 0856   CREATININE 0.90 12/06/2022 1742   GLUCOSE 92 12/06/2022 1742   CALCIUM 9.4 12/06/2022 1742   AST 27 07/24/2022 0856   ALT 17 07/24/2022 0856   ALKPHOS 102 07/24/2022 0856   BILITOT 0.3 07/24/2022 0856   PROT 7.3 07/24/2022 0856   ALBUMIN 4.7 07/24/2022 0856    RADIOGRAPHIC STUDIES: No results found.  PERFORMANCE STATUS (ECOG) : {CHL ONC ECOG  MV:[id number]}  Review of Systems Unless otherwise noted, a complete review of systems is negative.  Physical Exam General: NAD Cardiovascular: regular rate and rhythm Pulmonary: clear ant fields Abdomen: soft, nontender, + bowel sounds Extremities: no edema, no joint deformities Skin: no rashes Neurological: Alert and oriented x3  IMPRESSION: *** I introduced myself, Haruto Demaria RN, and Palliative's role in collaboration with the oncology team. Concept of Palliative Care was introduced as specialized medical care for people and their families living with serious  illness.  It focuses on providing relief from the symptoms and stress of a serious illness.  The goal is to improve quality of life for both the patient and the family. Values and goals of care important to patient and family were attempted to be elicited.    We discussed *** current illness and what it means in the larger context of *** on-going co-morbidities. Natural disease trajectory and expectations were discussed.  I discussed the importance of continued conversation with family and their medical providers regarding overall plan of care and treatment options, ensuring decisions are within the context of the patients values and GOCs.  PLAN: Established therapeutic relationship. Education provided on palliative's role in collaboration with their Oncology/Radiation team. I will plan to see patient back in 2-4 weeks in collaboration to other oncology appointments.    Patient expressed understanding and was in agreement with this plan. She also understands that She can call the clinic at any time with any questions, concerns, or complaints.   Thank you for your referral and allowing Palliative to assist in Mrs. Rosalind L Asselin's care.   Number and complexity of problems addressed: ***HIGH - 1 or more chronic illnesses with SEVERE exacerbation, progression, or side effects of treatment - advanced cancer, pain. Any controlled  substances utilized were prescribed in the context of palliative care.   Visit consisted of counseling and education dealing with the complex and emotionally intense issues of symptom management and palliative care in the setting of serious and potentially life-threatening illness.  Signed by: Willette Alma, AGPCNP-BC Palliative Medicine Team/[facility]   *Please note that this is a verbal dictation therefore any spelling or grammatical errors are due to the "Dragon Medical One" system interpretation.

## 2023-02-11 ENCOUNTER — Other Ambulatory Visit: Payer: Self-pay

## 2023-02-11 ENCOUNTER — Telehealth: Payer: Self-pay | Admitting: Adult Health

## 2023-02-11 DIAGNOSIS — F411 Generalized anxiety disorder: Secondary | ICD-10-CM

## 2023-02-11 MED ORDER — DIAZEPAM 5 MG PO TABS: 5.0000 mg | ORAL_TABLET | Freq: Two times a day (BID) | ORAL | 0 refills | Status: DC

## 2023-02-11 NOTE — Telephone Encounter (Signed)
Pended RF for diazepam #60, no refills.

## 2023-02-11 NOTE — Telephone Encounter (Signed)
[place] called at 4:50 to request refill of her Diazepam.  Appt 03/14/23.  Send to .[facility] - [place], [place] - [address]

## 2023-02-13 ENCOUNTER — Ambulatory Visit: Payer: Medicare PPO | Admitting: Physician Assistant

## 2023-02-13 ENCOUNTER — Inpatient Hospital Stay: Payer: Medicare PPO | Attending: Hematology and Oncology | Admitting: Nurse Practitioner

## 2023-02-26 ENCOUNTER — Other Ambulatory Visit: Payer: Self-pay | Admitting: *Deleted

## 2023-02-26 DIAGNOSIS — I872 Venous insufficiency (chronic) (peripheral): Secondary | ICD-10-CM

## 2023-02-28 ENCOUNTER — Other Ambulatory Visit: Payer: Self-pay | Admitting: Adult Health

## 2023-02-28 ENCOUNTER — Ambulatory Visit: Payer: Medicare PPO | Admitting: Physician Assistant

## 2023-02-28 ENCOUNTER — Telehealth: Payer: Self-pay

## 2023-02-28 ENCOUNTER — Ambulatory Visit: Payer: Medicare PPO | Admitting: Family Medicine

## 2023-02-28 VITALS — BP 112/66 | HR 52 | Temp 97.8°F | Ht 63.0 in | Wt 178.0 lb

## 2023-02-28 DIAGNOSIS — E785 Hyperlipidemia, unspecified: Secondary | ICD-10-CM

## 2023-02-28 DIAGNOSIS — Z86711 Personal history of pulmonary embolism: Secondary | ICD-10-CM | POA: Diagnosis not present

## 2023-02-28 DIAGNOSIS — I480 Paroxysmal atrial fibrillation: Secondary | ICD-10-CM

## 2023-02-28 DIAGNOSIS — F3161 Bipolar disorder, current episode mixed, mild: Secondary | ICD-10-CM

## 2023-02-28 DIAGNOSIS — R7303 Prediabetes: Secondary | ICD-10-CM | POA: Diagnosis not present

## 2023-02-28 DIAGNOSIS — Z7689 Persons encountering health services in other specified circumstances: Secondary | ICD-10-CM

## 2023-02-28 DIAGNOSIS — Z86718 Personal history of other venous thrombosis and embolism: Secondary | ICD-10-CM | POA: Diagnosis not present

## 2023-02-28 DIAGNOSIS — Z7901 Long term (current) use of anticoagulants: Secondary | ICD-10-CM

## 2023-02-28 DIAGNOSIS — F310 Bipolar disorder, current episode hypomanic: Secondary | ICD-10-CM

## 2023-02-28 DIAGNOSIS — I10 Essential (primary) hypertension: Secondary | ICD-10-CM

## 2023-02-28 LAB — CBC WITH DIFFERENTIAL/PLATELET
Basophils Absolute: 0 10*3/uL (ref 0.0–0.1)
Basophils Relative: 0.5 % (ref 0.0–3.0)
Eosinophils Absolute: 0.1 10*3/uL (ref 0.0–0.7)
Eosinophils Relative: 1.4 % (ref 0.0–5.0)
HCT: 43.2 % (ref 36.0–46.0)
Hemoglobin: 14 g/dL (ref 12.0–15.0)
Lymphocytes Relative: 30 % (ref 12.0–46.0)
Lymphs Abs: 2.8 10*3/uL (ref 0.7–4.0)
MCHC: 32.3 g/dL (ref 30.0–36.0)
MCV: 82.6 fL (ref 78.0–100.0)
Monocytes Absolute: 0.6 10*3/uL (ref 0.1–1.0)
Monocytes Relative: 6.2 % (ref 3.0–12.0)
Neutro Abs: 5.9 10*3/uL (ref 1.4–7.7)
Neutrophils Relative %: 61.9 % (ref 43.0–77.0)
Platelets: 232 10*3/uL (ref 150.0–400.0)
RBC: 5.23 Mil/uL — ABNORMAL HIGH (ref 3.87–5.11)
RDW: 15.1 % (ref 11.5–15.5)
WBC: 9.5 10*3/uL (ref 4.0–10.5)

## 2023-02-28 LAB — PROTIME-INR
INR: 2.6 {ratio} — ABNORMAL HIGH (ref 0.8–1.0)
Prothrombin Time: 26.2 s — ABNORMAL HIGH (ref 9.6–13.1)

## 2023-02-28 LAB — COMPREHENSIVE METABOLIC PANEL
ALT: 6 U/L (ref 0–35)
AST: 19 U/L (ref 0–37)
Albumin: 4.6 g/dL (ref 3.5–5.2)
Alkaline Phosphatase: 99 U/L (ref 39–117)
BUN: 15 mg/dL (ref 6–23)
CO2: 29 meq/L (ref 19–32)
Calcium: 9.7 mg/dL (ref 8.4–10.5)
Chloride: 103 meq/L (ref 96–112)
Creatinine, Ser: 1.09 mg/dL (ref 0.40–1.20)
GFR: 55.37 mL/min — ABNORMAL LOW (ref >60.00)
Glucose, Bld: 109 mg/dL — ABNORMAL HIGH (ref 70–99)
Potassium: 4.2 meq/L (ref 3.5–5.1)
Sodium: 141 meq/L (ref 135–145)
Total Bilirubin: 0.7 mg/dL (ref 0.2–1.2)
Total Protein: 8 g/dL (ref 6.0–8.3)

## 2023-02-28 LAB — LIPID PANEL
Cholesterol: 165 mg/dL (ref 0–200)
HDL: 56.1 mg/dL (ref >39.00)
LDL Cholesterol: 86 mg/dL (ref 0–99)
NonHDL: 109.18
Total CHOL/HDL Ratio: 3
Triglycerides: 116 mg/dL (ref 0.0–149.0)
VLDL: 23.2 mg/dL (ref 0.0–40.0)

## 2023-02-28 LAB — HEMOGLOBIN A1C: Hgb A1c MFr Bld: 5.8 % (ref 4.6–6.5)

## 2023-02-28 NOTE — Progress Notes (Signed)
New Patient Office Visit  Subjective    Patient ID: Emily Rice, female    DOB: November 10, 1960  Age: 62 y.o. MRN: [id number]  CC:  Chief Complaint  Patient presents with   Establish Care    Needs to get PT INR checked, wants a place close to here since Carollee Herter is not here today.    Hx of 3 blood clots in right leg- last one a little over a year ago, worked up by hemotologist. Continuous pain in right leg due to this.  Seeing Cardio in January- A Fib    HPI Emily Rice presents to establish care Last PCP [facility]- Dr. Vivi Barrack here from [facility] 4 years ago   Other providers: Cardiologist- Dr. Odis Hollingshead and Dr. Lalla Brothers EP Hematologist- for DVT work up OB/GYN- Dr. Steva Ready  Psychiatrist- Kallie Locks, NP for bipolar disorder  Counselor  Vascular-  Neurologist- Dr. Terrace Arabia  Neuropsychologist - Dr. [name]  [facility]- Dr. Rikki Spearing (started [facility])    A-fib- diagnosed in 2014. No longer rate controlled.  States her pulse is b/t 30s-150s   Hx of recurrent DVT and PE. Hypercoagulable work up did not show clotting disorder.  States she has been referred to vascular and pain management. She was taking narcotic pain medication at one point.   On warfarin indefinitely per hematologist.   Tremor- on carbidopa/levodopa    Married.  Does not drive  Is not working. Retired Charity fundraiser      Outpatient Encounter Medications as of 02/28/2023  Medication Sig   acetaminophen (TYLENOL) 325 MG tablet Take 2 tablets (650 mg total) by mouth every 6 (six) hours as needed for mild pain or moderate pain.   busPIRone (BUSPAR) 10 MG tablet Take 1 tablet (10 mg total) by mouth 3 (three) times daily.   carbidopa-levodopa (SINEMET IR) 25-100 MG tablet Take one tablet in the am and one tablet in the pm (Patient taking differently: Take 1 tablet by mouth 3 (three) times daily. Take one tablet in the am, one tablet in the afternoon, and one tablet in the pm)   cetirizine (ZYRTEC) 10 MG tablet  Take 10 mg by mouth daily as needed for allergies.   Cholecalciferol 75 MCG (3000 UT) TABS Take 3,000 Units by mouth daily.   diazepam (VALIUM) 5 MG tablet Take 1 tablet (5 mg total) by mouth 2 (two) times daily.   diltiazem (CARDIZEM CD) 360 MG 24 hr capsule TAKE 1 CAPSULE (360 MG TOTAL) BY MOUTH EVERY MORNING.   diphenhydrAMINE (BENADRYL ALLERGY EXTRA STR) 50 MG tablet Take 1 tablet (50 mg total) by mouth once for 1 dose. Take 1 tablet 1 hour prior to CT scan.   escitalopram (LEXAPRO) 10 MG tablet TAKE 1 TABLET AT BEDTIME   fexofenadine (ALLEGRA) 180 MG tablet Take 180 mg by mouth as needed for allergies or rhinitis.   fluticasone (FLONASE) 50 MCG/ACT nasal spray Place 1 spray into both nostrils daily as needed for allergies.    lamoTRIgine (LAMICTAL) 200 MG tablet TAKE 1 TABLET TWICE DAILY   lumateperone tosylate (CAPLYTA) 42 MG capsule TAKE 1 CAPSULE EVERY DAY   lumateperone tosylate (CAPLYTA) 42 MG capsule Take 1 capsule (42 mg total) by mouth daily.   Multiple Vitamin (MULTIVITAMIN WITH MINERALS) TABS tablet Take 1 tablet by mouth daily.   polyethylene glycol (MIRALAX / GLYCOLAX) 17 g packet Take 17 g by mouth 2 (two) times daily. (Patient taking differently: Take 17 g by mouth daily as needed for  mild constipation.)   QUEtiapine (SEROQUEL) 50 MG tablet Take one tablet at bedtime.   Semaglutide-Weight Management (WEGOVY) 0.25 MG/0.5ML SOAJ Inject 0.25 mg into the skin every 7 (seven) days.   warfarin (COUMADIN) 5 MG tablet Take 5 mg by mouth daily.   predniSONE (DELTASONE) 50 MG tablet Take 1 tablet 13 hours prior to CT scan, take 1 tablet 7 hours prior to CT scan, take 1 tablet 1 hour prior to CT scan. (Patient not taking: Reported on 02/28/2023)   No facility-administered encounter medications on file as of 02/28/2023.    Past Medical History:  Diagnosis Date   A-fib [facility])    Allergy    Anxiety    Bipolar disorder (HCC)    Chondromalacia patellae 12/14/2003   Depression    DVT  (deep vein thrombosis) in pregnancy    DVT (deep venous thrombosis) (HCC) 2001 and 2002   DVT (deep venous thrombosis) (HCC)    x 2   Esophagitis 06/06/2016   GERD (gastroesophageal reflux disease)    Hyperlipidemia    Migraines    Pulmonary embolism (HCC) 2001   S/P cholecystectomy 09/11/2012   Sickle cell anemia (HCC)    Suicidal ideation 12/28/2008   Tremor     Past Surgical History:  Procedure Laterality Date   CHOLECYSTECTOMY  2014   CHOLECYSTECTOMY     HUMERUS IM NAIL Right 03/03/2020   Procedure: INTRAMEDULLARY (IM) NAIL HUMERAL;  Surgeon: Roby Lofts, MD;  Location: [facility] OR;  Service: Orthopedics;  Laterality: Right;   OVARIAN CYST REMOVAL     TONSILLECTOMY      Family History  Problem Relation Age of Onset   Hypertension Mother    Stroke Father    Hypertension Father    Atrial fibrillation Father    Heart disease Father    Diabetes Sister    Hypertension Maternal Uncle    CAD Brother    ADD / ADHD Son    Depression Son    Anxiety disorder Daughter    BRCA 1/2 Neg Hx    Breast cancer Neg Hx     Social History   Socioeconomic History   Marital status: Married    Spouse name: Not on file   Number of children: 2   Years of education: college   Highest education level: Master's degree (e.g., MA, MS, MEng, MEd, MSW, MBA)  Occupational History   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 Right-handed. No daily use of  caffeine.     Social Drivers of Corporate investment banker Strain: Low Risk  (02/28/2023)   Overall Financial Resource Strain (CARDIA)    Difficulty of Paying Living Expenses: Not very hard  Food Insecurity: No Food Insecurity (02/28/2023)   Hunger Vital Sign    Worried About Running Out of Food in the Last Year:  Never true    Ran Out of Food in the Last Year: Never true  Transportation Needs: Unmet Transportation Needs (02/28/2023)   PRAPARE - Transportation    Lack of Transportation (Medical): Yes    Lack of Transportation (Non-Medical): Patient declined  Physical Activity: Sufficiently Active (02/28/2023)   Exercise Vital Sign    Days of Exercise per Week: 5 days    Minutes of Exercise per Session: 30 min  Stress: Patient Declined (02/28/2023)   [employer] of Occupational Health - Occupational Stress Questionnaire    Feeling of Stress : Patient declined  Social Connections: Socially Integrated (02/28/2023)   Social Connection and Isolation Panel [NHANES]    Frequency of Communication with Friends and Family: More than three times a week    Frequency of Social Gatherings with Friends and Family: More than three times a week    Attends Religious Services: More than 4 times per year    Active Member of [employer] or Organizations: Yes    Attends Banker Meetings: 1 to 4 times per year    Marital Status: Married  Catering manager Violence: Not on file    Review of Systems  Constitutional:  Negative for chills and fever.  Respiratory:  Negative for shortness of breath.   Cardiovascular:  Positive for chest pain and palpitations.  Gastrointestinal:  Negative for abdominal pain, constipation, diarrhea, nausea and vomiting.  Genitourinary:  Negative for dysuria, frequency and urgency.  Neurological:  Negative for dizziness and focal weakness.        Objective    BP 112/66 (BP Location: Left Arm, Patient Position: Sitting, Cuff Size: Large)   Pulse (!) 52  Comment: per patient pulse ranges from 36-40s  Temp 97.8 F (36.6 C) (Temporal)   Ht 5\' 3"  (1.6 m)   Wt 178 lb (80.7 kg)   LMP  (LMP Unknown)   SpO2 97%   BMI 31.53 kg/m   Physical Exam Constitutional:      General: She is not in acute distress.    Appearance: She is not ill-appearing.  Eyes:     Extraocular Movements: Extraocular movements intact.     Conjunctiva/sclera: Conjunctivae normal.  Cardiovascular:     Rate and Rhythm: Normal rate and regular rhythm.  Pulmonary:     Effort: Pulmonary effort is normal.     Breath sounds: Normal breath sounds.  Musculoskeletal:     Cervical back: Normal range of motion and neck supple.  Skin:    General: Skin is warm and dry.  Neurological:     General: No focal deficit present.     Mental Status: She is alert and oriented to person, place, and time.  Psychiatric:        Mood and Affect: Mood normal.        Behavior: Behavior normal.        Thought Content: Thought content normal.         Assessment & Plan:   Problem List Items Addressed This Visit     Benign essential hypertension - Primary   Relevant Orders   CBC with Differential/Platelet (Completed)   Comprehensive metabolic panel (Completed)   Bipolar affective disorder, current episode mixed (HCC)   History of DVT (deep vein thrombosis)   History of pulmonary embolism   Long term current use of anticoagulant therapy   Relevant Orders   Protime-INR (Completed)   Paroxysmal atrial fibrillation (HCC)   Other Visit Diagnoses       Prediabetes  Relevant Orders   Hemoglobin A1c (Completed)     Hyperlipidemia, unspecified hyperlipidemia type       Relevant Orders   Lipid panel (Completed)     Encounter to establish care          She is new to the practice and here to establish care.  Requests PT/INR check. Reports her last one was approx 2 months ago.  No bleeding concerns.  Reviewed notes from several specialists.  Reviewed medications and recent  labs.  No changes to medications today.  Check labs.  Follow up in 2 weeks.   Return in about 2 weeks (around 03/14/2023) for need 40 minutes .   Hetty Blend, NP-C

## 2023-02-28 NOTE — Patient Instructions (Signed)
Please go downstairs for labs before you leave.

## 2023-02-28 NOTE — Telephone Encounter (Signed)
Documentation placed in pt's chart under lab results

## 2023-02-28 NOTE — Telephone Encounter (Signed)
INR today is 2.6 This is within pt's range of 2.0-3.0   Contacted pt and advised to continue her current warfarin dosing of 5 mg daily except take 7.5 mg on Sundays. Pt reports she was told to make f/u apt for a couple of weeks and front desk scheduled her for 1/10. Advised her INR could also be checked that day. Placed pt on coumadin clinic schedule for same day. Provided coumadin clinic direct phone number and advised if any changes to contact the coumadin clinic. Pt verbalized understanding.   Per PCP lab result;  Avanell Shackleton, NP-C 02/28/2023  1:11 PM EST Back to Top    Her INR is 2.6. please ask her to see Carollee Herter going forward for her coumadin checks and let's get her scheduled for next week if possible. Her kidney function is slightly abnormal with a GFR of 55. Ask her to come in well hydrated and we will recheck this at her follow up visit. Her labs are fine otherwise.    Pt as advised of PCP result note. Pt reports she already drinks a lot of water but will drink more. Results reviewed with pt. Pt verbalized understanding.

## 2023-02-28 NOTE — Telephone Encounter (Signed)
Diagnoses for anticoagulation therapy include, DVT, PE and Afib.   INR range for these diagnoses is 2.0-3.0 Lab INR was drawn at PCP office today. Will f/u with pt via phone once the results are returned.

## 2023-02-28 NOTE — Telephone Encounter (Signed)
New Patient visit was today and would like to get established with Carollee Herter for [facility] Coumadin clinic if possible. Please let me know if you need anything from me.

## 2023-02-28 NOTE — Progress Notes (Signed)
Her INR is 2.6. please ask her to see Carollee Herter going forward for her coumadin checks and let's get her scheduled for next week if possible. Her kidney function is slightly abnormal with a GFR of 55. Ask her to come in well hydrated and we will recheck this at her follow up visit. Her labs are fine otherwise.

## 2023-03-07 ENCOUNTER — Ambulatory Visit: Payer: Medicare PPO | Admitting: Physician Assistant

## 2023-03-14 ENCOUNTER — Ambulatory Visit: Payer: Medicare PPO | Admitting: Physician Assistant

## 2023-03-14 ENCOUNTER — Ambulatory Visit: Payer: Medicare PPO | Admitting: Adult Health

## 2023-03-14 ENCOUNTER — Ambulatory Visit (HOSPITAL_COMMUNITY)
Admission: RE | Admit: 2023-03-14 | Discharge: 2023-03-14 | Disposition: A | Discharge status: Home / Self Care | Payer: Medicare PPO | Source: Ambulatory Visit | Attending: Vascular Surgery | Admitting: Vascular Surgery

## 2023-03-14 ENCOUNTER — Ambulatory Visit (INDEPENDENT_AMBULATORY_CARE_PROVIDER_SITE_OTHER): Payer: Medicare PPO | Admitting: Internal Medicine

## 2023-03-14 ENCOUNTER — Encounter: Payer: Self-pay | Admitting: Physician Assistant

## 2023-03-14 ENCOUNTER — Encounter (INDEPENDENT_AMBULATORY_CARE_PROVIDER_SITE_OTHER): Payer: Self-pay | Admitting: Internal Medicine

## 2023-03-14 ENCOUNTER — Encounter: Payer: Self-pay | Admitting: Adult Health

## 2023-03-14 VITALS — BP 136/77 | HR 52 | Temp 97.5°F | Resp 22 | Ht 63.0 in | Wt 185.2 lb

## 2023-03-14 VITALS — BP 114/63 | HR 54 | Temp 98.0°F | Ht 63.0 in | Wt 179.0 lb

## 2023-03-14 DIAGNOSIS — I872 Venous insufficiency (chronic) (peripheral): Secondary | ICD-10-CM | POA: Diagnosis not present

## 2023-03-14 DIAGNOSIS — Z86718 Personal history of other venous thrombosis and embolism: Secondary | ICD-10-CM | POA: Diagnosis not present

## 2023-03-14 DIAGNOSIS — F310 Bipolar disorder, current episode hypomanic: Secondary | ICD-10-CM

## 2023-03-14 DIAGNOSIS — Z6831 Body mass index (BMI) 31.0-31.9, adult: Secondary | ICD-10-CM

## 2023-03-14 DIAGNOSIS — H5711 Ocular pain, right eye: Secondary | ICD-10-CM

## 2023-03-14 DIAGNOSIS — F09 Unspecified mental disorder due to known physiological condition: Secondary | ICD-10-CM

## 2023-03-14 DIAGNOSIS — F4310 Post-traumatic stress disorder, unspecified: Secondary | ICD-10-CM | POA: Diagnosis not present

## 2023-03-14 DIAGNOSIS — F411 Generalized anxiety disorder: Secondary | ICD-10-CM | POA: Diagnosis not present

## 2023-03-14 DIAGNOSIS — T50905A Adverse effect of unspecified drugs, medicaments and biological substances, initial encounter: Secondary | ICD-10-CM | POA: Insufficient documentation

## 2023-03-14 DIAGNOSIS — R7303 Prediabetes: Secondary | ICD-10-CM | POA: Diagnosis not present

## 2023-03-14 DIAGNOSIS — E66811 Obesity, class 1: Secondary | ICD-10-CM

## 2023-03-14 DIAGNOSIS — Z1331 Encounter for screening for depression: Secondary | ICD-10-CM | POA: Diagnosis not present

## 2023-03-14 DIAGNOSIS — R635 Abnormal weight gain: Secondary | ICD-10-CM

## 2023-03-14 DIAGNOSIS — R0602 Shortness of breath: Secondary | ICD-10-CM | POA: Diagnosis not present

## 2023-03-14 DIAGNOSIS — G4700 Insomnia, unspecified: Secondary | ICD-10-CM | POA: Diagnosis not present

## 2023-03-14 MED ORDER — BUSPIRONE HCL 15 MG PO TABS: 15.0000 mg | ORAL_TABLET | Freq: Three times a day (TID) | ORAL | 2 refills | Status: DC

## 2023-03-14 MED ORDER — DIAZEPAM 5 MG PO TABS: 5.0000 mg | ORAL_TABLET | Freq: Two times a day (BID) | ORAL | 0 refills | Status: DC

## 2023-03-14 NOTE — Assessment & Plan Note (Addendum)
 Patient reports acute eye pain starting this morning with blurred vision and tearing.  No redness, fever or periorbital swelling.  Patient advised to schedule an appointment with ophthalmology today and if not to go to the emergency room to rule out eye emergency as she is experiencing visual changes with eye pain

## 2023-03-14 NOTE — Assessment & Plan Note (Signed)
 Most recent A1c is  Lab Results  Component Value Date   HGBA1C 5.8 02/28/2023   HGBA1C 5.8 (H) 07/31/2021    Patient aware of disease state and risk of progression. This may contribute to abnormal cravings, fatigue and diabetic complications without having diabetes.   We have discussed treatment options which include: losing 7 to 10% of body weight, increasing physical activity to a goal of 150 minutes a week at moderate intensity.  Advised to maintain a diet low on simple and processed carbohydrates.  She may also be a candidate for pharmacoprophylaxis with metformin  or incretin mimetic.  We will check fasting insulin  levels today.

## 2023-03-14 NOTE — Assessment & Plan Note (Signed)
 Patient is on Seroquel as she also takes several anticholinergic drugs which may contribute to weight gain.  She may benefit from pharmacoprophylaxis with metformin or GLP-1 therapy.  Will discuss further at the next office visit.
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
## 2023-03-14 NOTE — Assessment & Plan Note (Signed)
 See obesity treatment plan
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
## 2023-03-15 LAB — VITAMIN D 25 HYDROXY (VIT D DEFICIENCY, FRACTURES): Vit D, 25-Hydroxy: 59.4 ng/mL (ref 30.0–100.0)

## 2023-03-15 LAB — INSULIN, RANDOM: INSULIN: 8.7 u[IU]/mL (ref 2.6–24.9)

## 2023-03-18 ENCOUNTER — Ambulatory Visit: Payer: Medicare PPO | Admitting: Physician Assistant

## 2023-03-19 ENCOUNTER — Telehealth: Payer: Self-pay

## 2023-03-19 NOTE — Telephone Encounter (Signed)
 Patient has called back and left two messages again on the nurse line. I tried reaching out to her with no answer to see what she needs. I have reviewed all of the recommendations with her and not sure what it is she is looking for. I left a message that she can call back if needed however she needs to call her hematologist and follow up with them.

## 2023-03-19 NOTE — Telephone Encounter (Signed)
 Patient had called and left a message on the nurse triage line stating she had some questions about her recent visit with Lucie and wanted to know what other treatments are available. Reviewed pt's chart, returned call for clarification, two identifiers used.I discussed with patient what the recommendations were from Samantha for her treatment and that she needed to follow up with her hematologist. She also should start exercising/walking more and to wear compression as this is what was discussed with her at that appointment. Patient was to follow up with us  on an as needed basis.

## 2023-03-21 ENCOUNTER — Telehealth: Payer: Self-pay

## 2023-03-21 NOTE — Telephone Encounter (Signed)
 Contacted pt to offer an earlier apt for coumadin  clinic tomorrow due to the bad weather coming in the afternoon. She reports she cannot come in any earlier because her husband has to take off work. She reported she thought she had an apt with PCP tomorrow too but chart showed apt was cancelled due to lack of transportation. Pt was not aware that apt was cancelled. RS PCP f/u and coumadin  clinic apt for next week. Pt agreed to dates and times. Sent msg to front office to help with placing pt on PCP schedule due to blocked slot. Advised if any changes to contact the office. Pt verbalized understanding.

## 2023-03-22 ENCOUNTER — Ambulatory Visit: Payer: Medicare PPO | Admitting: Family Medicine

## 2023-03-22 ENCOUNTER — Ambulatory Visit: Payer: Medicare PPO

## 2023-03-23 ENCOUNTER — Other Ambulatory Visit: Payer: Self-pay | Admitting: Adult Health

## 2023-03-23 DIAGNOSIS — F310 Bipolar disorder, current episode hypomanic: Secondary | ICD-10-CM

## 2023-03-25 ENCOUNTER — Telehealth (INDEPENDENT_AMBULATORY_CARE_PROVIDER_SITE_OTHER): Payer: Self-pay | Admitting: Internal Medicine

## 2023-03-25 NOTE — Telephone Encounter (Signed)
 Pt needs clarification on calorie intake rather its 1000 or 1500

## 2023-03-25 NOTE — Telephone Encounter (Signed)
 Pt called in asking for clarification on whether she should be intaking 1000 calories or 1500 calories. Please follow up with patient.

## 2023-03-29 ENCOUNTER — Ambulatory Visit (INDEPENDENT_AMBULATORY_CARE_PROVIDER_SITE_OTHER): Payer: Medicare PPO

## 2023-03-29 ENCOUNTER — Encounter: Payer: Self-pay | Admitting: Family Medicine

## 2023-03-29 ENCOUNTER — Ambulatory Visit (INDEPENDENT_AMBULATORY_CARE_PROVIDER_SITE_OTHER): Payer: Medicare PPO | Admitting: Family Medicine

## 2023-03-29 ENCOUNTER — Ambulatory Visit: Payer: Medicare PPO | Admitting: Family Medicine

## 2023-03-29 VITALS — BP 140/80 | HR 51 | Ht 63.0 in | Wt 182.0 lb

## 2023-03-29 VITALS — BP 140/80 | HR 51 | Ht 64.0 in | Wt 182.2 lb

## 2023-03-29 DIAGNOSIS — Z7901 Long term (current) use of anticoagulants: Secondary | ICD-10-CM

## 2023-03-29 DIAGNOSIS — R944 Abnormal results of kidney function studies: Secondary | ICD-10-CM | POA: Diagnosis not present

## 2023-03-29 DIAGNOSIS — R7303 Prediabetes: Secondary | ICD-10-CM

## 2023-03-29 DIAGNOSIS — I10 Essential (primary) hypertension: Secondary | ICD-10-CM | POA: Diagnosis not present

## 2023-03-29 DIAGNOSIS — Z86718 Personal history of other venous thrombosis and embolism: Secondary | ICD-10-CM | POA: Diagnosis not present

## 2023-03-29 DIAGNOSIS — Z0000 Encounter for general adult medical examination without abnormal findings: Secondary | ICD-10-CM

## 2023-03-29 LAB — POCT INR: INR: 1.5 — AB (ref 2.0–3.0)

## 2023-03-29 NOTE — Progress Notes (Signed)
Pt is also seeing PCP today. Pt reports she has been eating a lot more vitamin K containing foods since last INR. She is going to return the her normal intake. Increase dose today and tomorrow to take 1 1/2 tablets and then continue 1 tablet daily except take 1 1/2 tablets on Sunday. Recheck in 2 weeks.

## 2023-03-29 NOTE — Progress Notes (Signed)
Subjective:   Emily Rice is a 63 y.o. female who presents for an Initial Medicare Annual Wellness Visit.  Visit Complete: In person  Patient Medicare AWV questionnaire was completed by the patient on 03/28/2023; I have confirmed that all information answered by patient is correct and no changes since this date.  Cardiac Risk Factors include: advanced age (>17men, >2 women);Other (see comment);hypertension, Risk factor comments: A-Fib, TBI     Objective:    Today's Vitals   03/28/23 1959  BP: (!) 140/80  Pulse: (!) 51  SpO2: 99%  Weight: 182 lb 3.2 oz (82.6 kg)  Height: 5\' 4"  (1.626 m)  PainSc: 4    Body mass index is 31.27 kg/m.     03/29/2023    3:22 PM 12/26/2021    9:07 AM 11/25/2021   12:30 PM 07/05/2021    4:52 PM 03/08/2020    5:00 PM 03/03/2020    2:00 AM 12/10/2019   11:59 AM  Advanced Directives  Does Patient Have a Medical Advance Directive? No No No No No No No  Would patient like information on creating a medical advance directive? Yes (MAU/Ambulatory/Procedural Areas - Information given)  No - Patient declined  Yes (Inpatient - patient requests chaplain consult to create a medical advance directive) No - Patient declined No - Patient declined    Current Medications (verified) Outpatient Encounter Medications as of 03/29/2023  Medication Sig   acetaminophen (TYLENOL) 325 MG tablet Take 2 tablets (650 mg total) by mouth every 6 (six) hours as needed for mild pain or moderate pain.   busPIRone (BUSPAR) 15 MG tablet Take 1 tablet (15 mg total) by mouth 3 (three) times daily.   CAPLYTA 42 MG capsule TAKE 1 CAPSULE EVERY DAY   Cholecalciferol 75 MCG (3000 UT) TABS Take 3,000 Units by mouth daily.   diazepam (VALIUM) 5 MG tablet Take 1 tablet (5 mg total) by mouth 2 (two) times daily.   diltiazem (CARDIZEM CD) 360 MG 24 hr capsule TAKE 1 CAPSULE (360 MG TOTAL) BY MOUTH EVERY MORNING.   escitalopram (LEXAPRO) 10 MG tablet TAKE 1 TABLET AT BEDTIME    fexofenadine (ALLEGRA) 180 MG tablet Take 180 mg by mouth as needed for allergies or rhinitis.   fluticasone (FLONASE) 50 MCG/ACT nasal spray Place 1 spray into both nostrils daily as needed for allergies.    lamoTRIgine (LAMICTAL) 200 MG tablet TAKE 1 TABLET TWICE DAILY   lumateperone tosylate (CAPLYTA) 42 MG capsule Take 1 capsule (42 mg total) by mouth daily.   Multiple Vitamin (MULTIVITAMIN WITH MINERALS) TABS tablet Take 1 tablet by mouth daily.   polyethylene glycol (MIRALAX / GLYCOLAX) 17 g packet Take 17 g by mouth 2 (two) times daily. (Patient taking differently: Take 17 g by mouth daily as needed for mild constipation.)   QUEtiapine (SEROQUEL) 50 MG tablet Take one tablet at bedtime.   warfarin (COUMADIN) 5 MG tablet Take 5 mg by mouth daily.   carbidopa-levodopa (SINEMET IR) 25-100 MG tablet Take one tablet in the am and one tablet in the pm (Patient taking differently: Take 1 tablet by mouth 3 (three) times daily. Take one tablet in the am, one tablet in the afternoon, and one tablet in the pm)   cetirizine (ZYRTEC) 10 MG tablet Take 10 mg by mouth daily as needed for allergies.   diphenhydrAMINE (BENADRYL ALLERGY EXTRA STR) 50 MG tablet Take 1 tablet (50 mg total) by mouth once for 1 dose. Take 1 tablet 1 hour  prior to CT scan. (Patient not taking: Reported on 03/29/2023)   predniSONE (DELTASONE) 50 MG tablet Take 1 tablet 13 hours prior to CT scan, take 1 tablet 7 hours prior to CT scan, take 1 tablet 1 hour prior to CT scan. (Patient not taking: Reported on 03/29/2023)   Semaglutide-Weight Management (WEGOVY) 0.25 MG/0.5ML SOAJ Inject 0.25 mg into the skin every 7 (seven) days.   No facility-administered encounter medications on file as of 03/29/2023.    Allergies (verified) Hydromorphone, Iodinated contrast media, and Prochlorperazine   History: Past Medical History:  Diagnosis Date   A-fib (HCC)    Allergy    Anxiety    Bipolar disorder (HCC)    Chondromalacia patellae  12/14/2003   Depression    DVT (deep vein thrombosis) in pregnancy    DVT (deep venous thrombosis) (HCC) 2001 and 2002   DVT (deep venous thrombosis) (HCC)    x 2   Esophagitis 06/06/2016   GERD (gastroesophageal reflux disease)    Hyperlipidemia    Migraines    Pulmonary embolism (HCC) 2001   S/P cholecystectomy 09/11/2012   Sickle cell anemia (HCC)    Suicidal ideation 12/28/2008   Tremor    Past Surgical History:  Procedure Laterality Date   CHOLECYSTECTOMY  2014   CHOLECYSTECTOMY     HUMERUS IM NAIL Right 03/03/2020   Procedure: INTRAMEDULLARY (IM) NAIL HUMERAL;  Surgeon: Roby Lofts, MD;  Location: [facility] OR;  Service: Orthopedics;  Laterality: Right;   OVARIAN CYST REMOVAL     TONSILLECTOMY     Family History  Problem Relation Age of Onset   Hypertension Mother    Stroke Father    Hypertension Father    Atrial fibrillation Father    Heart disease Father    Diabetes Sister    Hypertension Maternal Uncle    CAD Brother    ADD / ADHD Son    Depression Son    Anxiety disorder Daughter    BRCA 1/2 Neg Hx    Breast cancer Neg Hx    Social History   Socioeconomic History   Marital status: Married    Spouse name: Marine scientist   Number of children: 2   Years of education: college   Highest education level: Master's degree (e.g., MA, MS, MEng, MEd, MSW, MBA)  Occupational History   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   Right-handed.    No daily use of caffeine.   Lives with husband.-2025      Social Drivers of Health   Financial Resource Strain: Low Risk  (03/29/2023)   Overall Financial Resource Strain (CARDIA)    Difficulty of Paying Living Expenses: Not hard at all  Food Insecurity: No Food Insecurity (03/29/2023)   Hunger Vital Sign    Worried About Running Out of Food in the Last Year: Never true    Ran  Out of Food in the Last Year: Never true  Transportation Needs: No Transportation Needs (03/29/2023)   PRAPARE - Administrator, Civil Service (Medical): No    Lack of Transportation (Non-Medical): No  Recent Concern: Transportation Needs - Unmet Transportation Needs (02/28/2023)   PRAPARE - Transportation    Lack of Transportation (Medical): Yes    Lack of Transportation (Non-Medical): Patient declined  Physical Activity: Sufficiently Active (03/29/2023)   Exercise Vital Sign    Days of Exercise per Week: 5 days    Minutes of Exercise per Session: 60 min  Stress: Stress Concern Present (03/29/2023)   [employer] of Occupational Health - Occupational Stress Questionnaire    Feeling of Stress : To some extent  Social Connections: Moderately Isolated (03/29/2023)   Social Connection and Isolation Panel [NHANES]    Frequency of Communication with Friends and Family: More than three times a week    Frequency of Social Gatherings with Friends and Family: More than three times a week    Attends Religious Services: Never    Database administrator or Organizations: No    Attends Engineer, structural: Never    Marital Status: Married    Tobacco Counseling Counseling given: Not Answered Tobacco comments: Never   Clinical Intake:  Pre-visit preparation completed: Yes  Pain : 0-10 Pain Score: 4  Pain Location: Knee Pain Orientation: Left Pain Onset: More than a month ago Pain Frequency: Constant Pain Relieving Factors: Tylenol Effect of Pain on Daily Activities: when walking  Pain  Relieving Factors: Tylenol  Nutritional Risks: None  How often do you need to have someone help you when you read instructions, pamphlets, or other written materials from your doctor or pharmacy?: 1 - Never  Interpreter Needed?: No  Information entered by :: Harbert Fitterer, RMA   Activities of Daily Living    03/28/2023    7:59 PM  In your present state of health, do you have any difficulty performing the following activities:  Hearing? 0  Vision? 0  Difficulty concentrating or making decisions? 1  Walking or climbing stairs? 0  Dressing or bathing? 0  Doing errands, shopping? 1  Preparing Food and eating ? N  Using the Toilet? N  In the past six months, have you accidently leaked urine? N  Do you have problems with loss of bowel control? N  Managing your Medications? N  Managing your Finances? Y  Housekeeping or managing your Housekeeping? N    Patient Care Team: Avanell Shackleton, NP-C as PCP - General (Family Medicine) Lanier Prude, MD as PCP - Electrophysiology (Cardiology) Tessa Lerner, DO as PCP - Cardiology (Cardiology) Ellis Savage, NP as Nurse Practitioner (Psychiatry) Steva Ready, DO (Obstetrics and Gynecology)  Indicate any recent Medical Services you may have received from other than [facility] providers in the past year (date may be approximate).     Assessment:   This is a routine wellness examination for [place].  Hearing/Vision screen Hearing Screening - Comments:: Denies hearing difficulties   Vision Screening - Comments:: Wears eyeglasses    Goals Addressed   None   Depression Screen    03/29/2023    4:03 PM 10/05/2020    2:53 PM 04/21/2020    8:20 AM  PHQ 2/9 Scores  PHQ - 2 Score 0 4 3  PHQ- 9 Score 0  9    Fall Risk    03/28/2023    7:59 PM 02/28/2023    9:49 AM 10/05/2020  2:53 PM 07/20/2020    2:50 PM 04/21/2020    8:19 AM  Fall Risk   Falls in the past year? 1 0 1 1 1   Comment     3 FALLS IN Jan- Feb  Number falls in past yr:  0 0 1  1  Injury with Fall? 0 0 1  0  Risk for fall due to :  No Fall Risks     Follow up Falls evaluation completed;Falls prevention discussed Falls evaluation completed       MEDICARE RISK AT HOME: Medicare Risk at Home Any stairs in or around the home?: (Patient-Rptd) Yes If so, are there any without handrails?: (Patient-Rptd) No Home free of loose throw rugs in walkways, pet beds, electrical cords, etc?: (Patient-Rptd) No Adequate lighting in your home to reduce risk of falls?: (Patient-Rptd) Yes Life alert?: (Patient-Rptd) No Use of a cane, walker or w/c?: (Patient-Rptd) No Grab bars in the bathroom?: (Patient-Rptd) No Shower chair or bench in shower?: (Patient-Rptd) No Elevated toilet seat or a handicapped toilet?: (Patient-Rptd) No  TIMED UP AND GO:  Was the test performed? Yes  Length of time to ambulate 10 feet: 15 sec Gait slow and steady without use of assistive device    Cognitive Function:      07/12/2022    9:11 AM 07/31/2021    3:38 PM  Montreal Cognitive Assessment   Visuospatial/ Executive (0/5) 2 3  Naming (0/3) 3 3  Attention: Read list of digits (0/2) 1 2  Attention: Read list of letters (0/1) 1 1  Attention: Serial 7 subtraction starting at 100 (0/3) 1 2  Language: Repeat phrase (0/2) 1 1  Language : Fluency (0/1) 0 0  Abstraction (0/2) 2 1  Delayed Recall (0/5) 1 1  Orientation (0/6) 6 6  Total 18 20  Adjusted Score (based on education)  20      03/29/2023    2:58 PM  6CIT Screen  What Year? 0 points  What month? 0 points  What time? 0 points  Count back from 20 0 points  Months in reverse 0 points  Repeat phrase 0 points  Total Score 0 points    Immunizations Immunization History  Administered Date(s) Administered   Fluzone Influenza virus vaccine,trivalent (IIV3), split virus 12/10/2013, 11/09/2014, 11/02/2015, 10/30/2016   Hepatitis B, ADULT 11/12/1990, 03/31/1991, 10/27/1991   Influenza, Quadrivalent, Recombinant, Inj, Pf 11/03/2017    Influenza, Seasonal, Injecte, Preservative Fre 12/14/2022   Influenza,inj,Quad PF,6+ Mos 11/02/2013, 11/09/2014, 11/02/2015, 10/30/2016   Influenza,inj,quad, With Preservative 10/30/2016   Influenza-Unspecified 12/11/2008, 11/25/2009, 01/10/2011, 01/21/2012, 06/10/2019   PFIZER(Purple Top)SARS-COV-2 Vaccination 05/22/2019, 06/03/2019, 06/16/2019   PPD Test 04/19/2004, 09/03/2017, 09/23/2017   Pneumococcal Polysaccharide-23 03/12/2012, 09/03/2012   Td (Adult) 10/19/1996   Td (Adult),unspecified 11/15/2005, 11/15/2005   Tdap 01/10/2011, 03/02/2020    TDAP status: Up to date  Flu Vaccine status: Up to date  Pneumococcal vaccine status: Due, Education has been provided regarding the importance of this vaccine. Advised may receive this vaccine at local pharmacy or Health Dept. Aware to provide a copy of the vaccination record if obtained from local pharmacy or Health Dept. Verbalized acceptance and understanding.  Covid-19 vaccine status: Information provided on how to obtain vaccines.   Qualifies for Shingles Vaccine? Yes   Zostavax completed No   Shingrix Completed?: No.    Education has been provided regarding the importance of this vaccine. Patient has been advised to call insurance company to determine out of pocket expense if they  have not yet received this vaccine. Advised may also receive vaccine at local pharmacy or Health Dept. Verbalized acceptance and understanding.  Screening Tests Health Maintenance  Topic Date Due   Hepatitis C Screening  Never done   Cervical Cancer Screening (HPV/Pap Cotest)  Never done   Colonoscopy  Never done   Pneumococcal Vaccine 36-21 Years old (3 of 3 - PCV) 09/03/2013   COVID-19 Vaccine (4 - 2024-25 season) 11/11/2022   Medicare Annual Wellness (AWV)  03/28/2024   MAMMOGRAM  02/05/2025   DTaP/Tdap/Td (3 - Td or Tdap) 03/02/2030   INFLUENZA VACCINE  Completed   HIV Screening  Completed   HPV VACCINES  Aged Out   Zoster Vaccines- Shingrix   Discontinued    Health Maintenance  Health Maintenance Due  Topic Date Due   Hepatitis C Screening  Never done   Cervical Cancer Screening (HPV/Pap Cotest)  Never done   Colonoscopy  Never done   Pneumococcal Vaccine 67-83 Years old (3 of 3 - PCV) 09/03/2013   COVID-19 Vaccine (4 - 2024-25 season) 11/11/2022    Mammogram status: Completed 02/06/2023. Repeat every year   Lung Cancer Screening: (Low Dose CT Chest recommended if Age 61-80 years, 20 pack-year currently smoking OR have quit w/in 15years.) does not qualify.   Lung Cancer Screening Referral: N/A  Additional Screening:  Hepatitis C Screening: does qualify; Completed N/A  Vision Screening: Recommended annual ophthalmology exams for early detection of glaucoma and other disorders of the eye. Is the patient up to date with their annual eye exam?  Yes  Who is the provider or what is the name of the office in which the patient attends annual eye exams? [facility] If pt is not established with a provider, would they like to be referred to a provider to establish care? No .   Dental Screening: Recommended annual dental exams for proper oral hygiene   Community Resource Referral / Chronic Care Management: CRR required this visit?  No   CCM required this visit?  No     Plan:     I have personally reviewed and noted the following in the patient's chart:   Medical and social history Use of alcohol, tobacco or illicit drugs  Current medications and supplements including opioid prescriptions. Patient is not currently taking opioid prescriptions. Functional ability and status Nutritional status Physical activity Advanced directives List of other physicians Hospitalizations, surgeries, and ER visits in previous 12 months Vitals Screenings to include cognitive, depression, and falls Referrals and appointments  In addition, I have reviewed and discussed with patient certain preventive protocols, quality metrics,  and best practice recommendations. A written personalized care plan for preventive services as well as general preventive health recommendations were provided to patient.     Danissa Rundle L Margaretta Chittum, CMA   03/29/2023   After Visit Summary: (MyChart) Due to this being a telephonic visit, the after visit summary with patients personalized plan was offered to patient via MyChart   Nurse Notes: Patient is due for a Pneumonia vaccine and would like to discuss during visit today.  She is also due for a Hep C screening.  Patient stated that she had a colonoscopy after 2015 and came back normal.  She also would like to discuss options for her lt knee pain due to having 2 blood clots.  Patient had no other concerns to address today,=

## 2023-03-29 NOTE — Patient Instructions (Addendum)
Emily Rice , Thank you for taking time to come for your Medicare Wellness Visit. I appreciate your ongoing commitment to your health goals. Please review the following plan we discussed and let me know if I can assist you in the future.   Referrals/Orders/Follow-Ups/Clinician Recommendations: It was very nice to meet you today.  Aim for 30 minutes of exercise or brisk walking, 6-8 glasses of water, and 5 servings of fruits and vegetables each day. You are due for a Pneumonia vaccine and a Shingles vaccine  This is a list of the screening recommended for you and due dates:  Health Maintenance  Topic Date Due   Hepatitis C Screening  Never done   Pap with HPV screening  Never done   Colon Cancer Screening  Never done   Pneumococcal Vaccination (3 of 3 - PCV) 09/03/2013   COVID-19 Vaccine (4 - 2024-25 season) 11/11/2022   Medicare Annual Wellness Visit  03/28/2024   Mammogram  02/05/2025   DTaP/Tdap/Td vaccine (3 - Td or Tdap) 03/02/2030   Flu Shot  Completed   HIV Screening  Completed   HPV Vaccine  Aged Out   Zoster (Shingles) Vaccine  Discontinued    Advanced directives: (Copy Requested) Please bring a copy of your health care power of attorney and living will to the office to be added to your chart at your convenience.  Next Medicare Annual Wellness Visit scheduled for next year: Yes

## 2023-03-29 NOTE — Progress Notes (Signed)
Subjective:     Patient ID: Roney Mans, female    DOB: 1961/03/10, 63 y.o.   MRN: [id number]  No chief complaint on file.   HPI   History of Present Illness         Here today for follow up, our 2nd visit.  She had AWV with nurse prior to seeing me. She will see Carollee Herter with coumadin clinic following my visit.    Moved here from [place] 4 years ago    Other providers: Cardiologist- Dr. Odis Hollingshead and Dr. Lalla Brothers EP Hematologist- Dr Al Pimple for DVT work up OB/GYN- Dr. Steva Ready  Psychiatrist- Kallie Locks, NP for bipolar disorder  Counselor- Rockne Menghini Vascular- Doreatha Massed [facility] Neurologist- Dr. Terrace Arabia  Neuropsychologist - Dr. [name]  [facility]- Dr. Rikki Spearing (started [facility])    States Dr. Al Pimple wants to refer her to pain management for her RLE pain.   She is under the care of Yvette Rack, NP for bipolar disorder, GAD, insomnia, cognitive disorder and PTSD.  Prescribed Valium 5 mg bid, Caplyta 42 mg daily, Seroquel 50 mg at bedtime, Lamictal 200 mg bid, Lexapro 10 mg daily and Buspar was recently increased to 15 mg TID.   Sees therapist Rockne Menghini  Health Maintenance Due  Topic Date Due   Hepatitis C Screening  Never done   Cervical Cancer Screening (HPV/Pap Cotest)  Never done   Colonoscopy  Never done   Pneumococcal Vaccine 22-21 Years old (3 of 3 - PCV) 09/03/2013   COVID-19 Vaccine (4 - 2024-25 season) 11/11/2022    Past Medical History:  Diagnosis Date   A-fib [facility])    Allergy    Anxiety    Bipolar disorder (HCC)    Chondromalacia patellae 12/14/2003   Depression    DVT (deep vein thrombosis) in pregnancy    DVT (deep venous thrombosis) (HCC) 2001 and 2002   DVT (deep venous thrombosis) (HCC)    x 2   Esophagitis 06/06/2016   GERD (gastroesophageal reflux disease)    Hyperlipidemia    Migraines    Pulmonary embolism (HCC) 2001   S/P cholecystectomy 09/11/2012   Sickle cell anemia (HCC)    Suicidal ideation 12/28/2008   Tremor     Past  Surgical History:  Procedure Laterality Date   CHOLECYSTECTOMY  2014   CHOLECYSTECTOMY     HUMERUS IM NAIL Right 03/03/2020   Procedure: INTRAMEDULLARY (IM) NAIL HUMERAL;  Surgeon: Roby Lofts, MD;  Location: [facility] OR;  Service: Orthopedics;  Laterality: Right;   OVARIAN CYST REMOVAL     TONSILLECTOMY      Family History  Problem Relation Age of Onset   Hypertension Mother    Stroke Father    Hypertension Father    Atrial fibrillation Father    Heart disease Father    Diabetes Sister    Hypertension Maternal Uncle    CAD Brother    ADD / ADHD Son    Depression Son    Anxiety disorder Daughter    BRCA 1/2 Neg Hx    Breast cancer Neg Hx     Social History   Socioeconomic History   Marital status: Married    Spouse name: Reggie Pile   Number of children: 2   Years of education: college   Highest education level: Master's degree (e.g., MA, MS, MEng, MEd, MSW, MBA)  Occupational History   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   Right-handed.   No daily use of caffeine.   Lives with husband.-2025      Social Drivers of Health   Financial Resource Strain: Low Risk  (03/29/2023)   Overall Financial Resource Strain (CARDIA)    Difficulty of Paying Living Expenses: Not hard at all  Food Insecurity: No Food Insecurity (03/29/2023)   Hunger Vital Sign    Worried About Running Out of Food in the Last Year: Never true    Ran Out of Food in the Last  Year: Never true  Transportation Needs: No Transportation Needs (03/29/2023)   PRAPARE - Administrator, Civil Service (Medical): No    Lack of Transportation (Non-Medical): No  Recent Concern: Transportation Needs - Unmet Transportation Needs (02/28/2023)   PRAPARE - Transportation    Lack of Transportation (Medical): Yes    Lack of Transportation (Non-Medical): Patient declined  Physical Activity: Sufficiently Active (03/29/2023)   Exercise Vital Sign    Days of Exercise per Week: 5 days    Minutes of Exercise per Session: 60 min  Stress: Stress Concern Present (03/29/2023)   [employer] of Occupational Health - Occupational Stress Questionnaire    Feeling of Stress : To some extent  Social Connections: Moderately Isolated (03/29/2023)   Social Connection and Isolation Panel [NHANES]    Frequency of Communication with Friends and Family: More than three times a week    Frequency of Social Gatherings with Friends and Family: More than three times a week    Attends Religious Services: Never    Database administrator or Organizations: No    Attends Banker Meetings: Never    Marital Status: Married  Catering manager Violence: Not At Risk (03/29/2023)   Humiliation, Afraid, Rape, and Kick questionnaire    Fear of Current or Ex-Partner: No    Emotionally Abused: No    Physically Abused: No    Sexually Abused: No    Outpatient Medications Prior to Visit  Medication Sig Dispense Refill   acetaminophen (TYLENOL) 325 MG tablet Take 2 tablets (650 mg total) by mouth every 6 (six) hours as needed for mild pain or moderate pain.     busPIRone (BUSPAR) 15 MG tablet Take 1 tablet (15 mg total) by mouth 3 (three) times daily. 90 tablet 2   CAPLYTA 42 MG capsule TAKE 1 CAPSULE EVERY DAY 30 capsule 11   carbidopa-levodopa (SINEMET IR) 25-100 MG tablet Take one tablet in the am and one tablet in the pm (Patient taking differently: Take 1 tablet by mouth 3 (three) times  daily. Take one tablet in the am, one tablet in the afternoon, and one tablet in the pm) 60 tablet 4   cetirizine (ZYRTEC) 10 MG tablet Take 10 mg by mouth daily as needed for allergies.     Cholecalciferol 75 MCG (3000 UT) TABS Take 3,000 Units by mouth daily. 30 tablet 0   diazepam (VALIUM) 5 MG tablet  Take 1 tablet (5 mg total) by mouth 2 (two) times daily. 60 tablet 0   diltiazem (CARDIZEM CD) 360 MG 24 hr capsule TAKE 1 CAPSULE (360 MG TOTAL) BY MOUTH EVERY MORNING. 90 capsule 0   diphenhydrAMINE (BENADRYL ALLERGY EXTRA STR) 50 MG tablet Take 1 tablet (50 mg total) by mouth once for 1 dose. Take 1 tablet 1 hour prior to CT scan. (Patient not taking: Reported on 03/29/2023) 1 tablet 0   escitalopram (LEXAPRO) 10 MG tablet TAKE 1 TABLET AT BEDTIME 90 tablet 3   fexofenadine (ALLEGRA) 180 MG tablet Take 180 mg by mouth as needed for allergies or rhinitis.     fluticasone (FLONASE) 50 MCG/ACT nasal spray Place 1 spray into both nostrils daily as needed for allergies.      lamoTRIgine (LAMICTAL) 200 MG tablet TAKE 1 TABLET TWICE DAILY 180 tablet 0   lumateperone tosylate (CAPLYTA) 42 MG capsule Take 1 capsule (42 mg total) by mouth daily. 30 capsule 0   Multiple Vitamin (MULTIVITAMIN WITH MINERALS) TABS tablet Take 1 tablet by mouth daily.     polyethylene glycol (MIRALAX / GLYCOLAX) 17 g packet Take 17 g by mouth 2 (two) times daily. (Patient taking differently: Take 17 g by mouth daily as needed for mild constipation.) 14 each 0   predniSONE (DELTASONE) 50 MG tablet Take 1 tablet 13 hours prior to CT scan, take 1 tablet 7 hours prior to CT scan, take 1 tablet 1 hour prior to CT scan. (Patient not taking: Reported on 03/29/2023) 3 tablet 0   QUEtiapine (SEROQUEL) 50 MG tablet Take one tablet at bedtime. 90 tablet 0   Semaglutide-Weight Management (WEGOVY) 0.25 MG/0.5ML SOAJ Inject 0.25 mg into the skin every 7 (seven) days. 2 mL 0   warfarin (COUMADIN) 5 MG tablet Take 5 mg by mouth daily.     No  facility-administered medications prior to visit.    Allergies  Allergen Reactions   Hydromorphone Other (See Comments)    Stroke-like symptoms. treated with Narcan; Tolerated Percocet in the past.   Iodinated Contrast Media Other (See Comments)    Reaction not fully known   Prochlorperazine Swelling and Other (See Comments)    Compazine - tongue swelling    Review of Systems  Constitutional:  Negative for chills and fever.  Respiratory:  Negative for shortness of breath.   Cardiovascular:  Negative for chest pain, palpitations and leg swelling.  Gastrointestinal:  Negative for abdominal pain, constipation, diarrhea, nausea and vomiting.  Neurological:  Negative for dizziness and focal weakness.       Objective:    Physical Exam Constitutional:      General: She is not in acute distress.    Appearance: She is not ill-appearing.  Eyes:     Extraocular Movements: Extraocular movements intact.     Conjunctiva/sclera: Conjunctivae normal.  Cardiovascular:     Rate and Rhythm: Normal rate.  Pulmonary:     Effort: Pulmonary effort is normal.  Musculoskeletal:     Cervical back: Normal range of motion and neck supple.  Skin:    General: Skin is warm and dry.  Neurological:     General: No focal deficit present.     Mental Status: She is alert and oriented to person, place, and time.  Psychiatric:        Mood and Affect: Mood normal.        Behavior: Behavior normal.        Thought Content: Thought content normal.  BP (!) 140/80   Pulse (!) 51   Ht 5\' 3"  (1.6 m)   Wt 182 lb (82.6 kg)   LMP  (LMP Unknown)   SpO2 99%   BMI 32.24 kg/m  Wt Readings from Last 3 Encounters:  03/29/23 182 lb (82.6 kg)  03/28/23 182 lb 3.2 oz (82.6 kg)  03/14/23 185 lb 3.2 oz (84 kg)       Assessment & Plan:   Problem List Items Addressed This Visit     Benign essential hypertension - Primary   History of DVT (deep vein thrombosis)   Long term current use of anticoagulant  therapy   Prediabetes   Other Visit Diagnoses       Decreased GFR          Reviewed notes from specialists as well as labs.  Continue monitoring BP at home.  Continue current medications.  Continue seeing specialists.  Check with pharmacy regarding Prevnar 20.  Recheck renal function and check hepatitis C at her follow up  I am having Noor L. Earl Lites maintain her fluticasone, fexofenadine, acetaminophen, Cholecalciferol, multivitamin with minerals, polyethylene glycol, carbidopa-levodopa, warfarin, cetirizine, predniSONE, diphenhydrAMINE, diltiazem, escitalopram, Wegovy, QUEtiapine, lumateperone tosylate, lamoTRIgine, diazepam, busPIRone, and Caplyta.  No orders of the defined types were placed in this encounter.

## 2023-03-29 NOTE — Patient Instructions (Signed)
Check with your pharmacy regarding Prevnar 20

## 2023-03-29 NOTE — Patient Instructions (Addendum)
Pre visit review using our clinic review tool, if applicable. No additional management support is needed unless otherwise documented below in the visit note.  Increase dose today and tomorrow to take 1 1/2 tablets and then continue 1 tablet daily except take 1 1/2 tablets on Sunday. Recheck in 2 weeks.

## 2023-04-01 ENCOUNTER — Ambulatory Visit (INDEPENDENT_AMBULATORY_CARE_PROVIDER_SITE_OTHER): Payer: Medicare PPO | Admitting: Internal Medicine

## 2023-04-01 ENCOUNTER — Ambulatory Visit: Payer: Medicare PPO | Admitting: Student

## 2023-04-01 ENCOUNTER — Telehealth (INDEPENDENT_AMBULATORY_CARE_PROVIDER_SITE_OTHER): Payer: Self-pay

## 2023-04-01 NOTE — Telephone Encounter (Signed)
Left msg for pt to return my call re: meal plan

## 2023-04-02 NOTE — Telephone Encounter (Signed)
Pt called and confirmed she understood her plan of 1000 cal and 80 g protein

## 2023-04-03 ENCOUNTER — Telehealth: Payer: Self-pay | Admitting: Adult Health

## 2023-04-03 ENCOUNTER — Other Ambulatory Visit: Payer: Self-pay

## 2023-04-03 DIAGNOSIS — F411 Generalized anxiety disorder: Secondary | ICD-10-CM

## 2023-04-03 MED ORDER — BUSPIRONE HCL 15 MG PO TABS
15.0000 mg | ORAL_TABLET | Freq: Three times a day (TID) | ORAL | 1 refills | Status: DC
Start: 2023-04-03 — End: 2023-06-05

## 2023-04-03 NOTE — Telephone Encounter (Signed)
No PA requests have been received yet. She is due for renewal on Caplyta, her last PA expired 03/12/23. She's never had PA's on anything noted.   PA for Caplyta submitted

## 2023-04-03 NOTE — Telephone Encounter (Signed)
Canceled Buspar 15mg  at [facility] and sent 90ds to [facility].

## 2023-04-03 NOTE — Telephone Encounter (Signed)
The PA request for a 90 day supply of Caplyta is DENIED, this drug is only limited to a 30 day supply. Due to this cost/tier her plan only allows 30 day. Page 5 of patient's prescription drug guide discusses the limit.

## 2023-04-03 NOTE — Telephone Encounter (Signed)
Pt called and said that she needs a PA done her caplyta 42 mg. Insurance will not cover this med. Sahe thinks she needs PA on all her meds. She also wants the buspar 15 mg to be cancelled at [facility] and send to [facility]  mail order

## 2023-04-03 NOTE — Telephone Encounter (Signed)
It has already been submitted to [facility] as a 30 day supply

## 2023-04-04 NOTE — Telephone Encounter (Signed)
Pt's prior authorization for Caplyta 42 mg is approved and effective 01/11/2023-03/11/2024, PA# [id number].  Reference # [id number]

## 2023-04-08 ENCOUNTER — Telehealth: Payer: Self-pay

## 2023-04-08 NOTE — Telephone Encounter (Signed)
Tried to contact pt but she answered and then hung up. LVM for her to return call.

## 2023-04-08 NOTE — Telephone Encounter (Signed)
Contacted pt who reports she needed to change her apt but after looking at her husband's schedule, he provides transportation, she is going to keep the current coumadin clinic apt she has on 1/31.  Pt reports she has had some gum bleeding, x2, but it has only been during brushing. She reports she flosses as well and has not had any bleeding with flossing. She denies any other bleeding or abnormal bruising. Advised pt to have INR checked tomorrow. She report she thinks everything is fine and her husband cannot bring her to the clinic. Advised if any other s/s of abnormal bruising or bleeding to contact coumadin clinic and go to ER. Pt verbalized understanding.

## 2023-04-08 NOTE — Telephone Encounter (Signed)
Copied from CRM [id number]. Topic: Appointments - Scheduling Inquiry for Clinic >> Apr 08, 2023 11:34 AM Thomes Dinning wrote: Reason for CRM: Patient is wanting to confirm when nurse Carollee Herter would be in the office to have her appointment rescheduled.

## 2023-04-11 ENCOUNTER — Encounter (INDEPENDENT_AMBULATORY_CARE_PROVIDER_SITE_OTHER): Payer: Self-pay | Admitting: Internal Medicine

## 2023-04-11 ENCOUNTER — Ambulatory Visit (INDEPENDENT_AMBULATORY_CARE_PROVIDER_SITE_OTHER): Payer: Medicare PPO | Admitting: Internal Medicine

## 2023-04-11 ENCOUNTER — Other Ambulatory Visit: Payer: Self-pay | Admitting: Adult Health

## 2023-04-11 VITALS — BP 119/69 | HR 70 | Ht 63.0 in | Wt 174.0 lb

## 2023-04-11 DIAGNOSIS — E66811 Obesity, class 1: Secondary | ICD-10-CM

## 2023-04-11 DIAGNOSIS — Z6831 Body mass index (BMI) 31.0-31.9, adult: Secondary | ICD-10-CM

## 2023-04-11 DIAGNOSIS — R7303 Prediabetes: Secondary | ICD-10-CM

## 2023-04-11 DIAGNOSIS — F310 Bipolar disorder, current episode hypomanic: Secondary | ICD-10-CM

## 2023-04-11 DIAGNOSIS — R7401 Elevation of levels of liver transaminase levels: Secondary | ICD-10-CM | POA: Diagnosis not present

## 2023-04-11 DIAGNOSIS — R638 Other symptoms and signs concerning food and fluid intake: Secondary | ICD-10-CM | POA: Insufficient documentation

## 2023-04-11 MED ORDER — WEGOVY 0.5 MG/0.5ML ~~LOC~~ SOAJ: 0.5000 mg | SUBCUTANEOUS | 0 refills | Status: DC

## 2023-04-11 NOTE — Assessment & Plan Note (Signed)
She has increased orexigenic signaling, impaired satiety and inhibitory control. This is secondary to an abnormal energy regulation system and pathological neurohormonal pathways characteristic of excess adiposity.  She also has a history of traumatic brain injury which may have affected her energy regulation system.  In addition to nutritional and behavioral strategies she benefits from pharmacotherapy.  She had been prescribed Wegovy at previous weight management center and was approved by her insurance she will be restarted at 0.5 mg once a week.

## 2023-04-11 NOTE — Progress Notes (Signed)
Office: [phone]  /  Fax: [phone]  Weight Summary And Biometrics  Vitals BP: 119/69 Pulse Rate: 70 SpO2: 96 %   Anthropometric Measurements Height: 5\' 3"  (1.6 m) Weight: 174 lb (78.9 kg) BMI (Calculated): 30.83 Weight at Last Visit: 179 lb Weight Lost Since Last Visit: 5 lb Weight Gained Since Last Visit: 0 lb Starting Weight: 179 lb Total Weight Loss (lbs): 5 lb (2.268 kg) Peak Weight: 203 lb   Body Composition  Body Fat %: 41.2 % Fat Mass (lbs): 72 lbs Muscle Mass (lbs): 97.4 lbs Total Body Water (lbs): 69.4 lbs Visceral Fat Rating : 11    RMR: 1325  Today's Visit #: 2  Starting Date: 03/14/23   Subjective   Chief Complaint: Obesity  Emily Rice is here to discuss her progress with her obesity treatment plan. She is on the the Vegetarian Plan  - 1,000 calories and 80 grams of protein daily and states she is following her eating plan approximately 85 % of the time. She states she is exercising 60 minutes 2 times per week.  Weight Progress Since Last Visit:  Since last office visit she has lost 5 pounds. She reports good adherence to reduced calorie nutritional plan. She has been working on reading food labels, not skipping meals, increasing protein intake at every meal, eating more fruits, eating more vegetables, drinking more water, avoiding and / or reducing liquid calories, avoiding or reducing simple and processed carbohydrates, journaling and tracking calories, making healthier choices, reducing portion sizes, and incorporating more whole foods   She is a former patient of [facility] program and had been prescribed Wegovy.  She reports medication was covered by her insurance.  She is requesting that I prescribe medication as it was helping her with appetite and satiety.  Challenges affecting patient progress: medical comorbidities and presence of obesogenic drugs.   Orexigenic Control: Reports problems with appetite and hunger signals.   Reports problems with satiety and satiation.  Denies problems with eating patterns and portion control.  Reports abnormal cravings. Denies feeling deprived or restricted.   Pharmacotherapy for weight management: She had been taking Wegovy in the fall of last year but she stopped going to [facility] program she denied any medication side effects.  She is requesting that we prescribe medication at .5 mg.  Assessment and Plan   Treatment Plan For Obesity:  Recommended Dietary Goals  Alta is currently in the action stage of change. As such, her goal is to continue weight management plan. She has agreed to: continue current plan  Behavioral Health and Counseling  We discussed the following behavioral modification strategies today: continue to work on maintaining a reduced calorie state, getting the recommended amount of protein, incorporating whole foods, making healthy choices, staying well hydrated and practicing mindfulness when eating..  Additional education and resources provided today: None  Recommended Physical Activity Goals  [name] has been advised to work up to 150 minutes of moderate intensity aerobic activity a week and strengthening exercises 2-3 times per week for cardiovascular health, weight loss maintenance and preservation of muscle mass.   She has agreed to :  continue to gradually increase the amount and intensity of exercise routine  Pharmacotherapy  We discussed various medication options to help [name] with her weight loss efforts and we both agreed to : start anti-obesity medication.  In addition to reduced calorie nutrition plan (RCNP), behavioral strategies and physical activity, [name] would benefit from pharmacotherapy to assist with hunger  signals, satiety and cravings. This will reduce obesity-related health risks by inducing weight loss, and help reduce food consumption and adherence to [facility]) . It may also improve QOL by improving  self-confidence and reduce the  setbacks associated with metabolic adaptations.  After discussion of treatment options, mechanisms of action, benefits, side effects, contraindications and shared decision making she is agreeable to starting Wegovy 0.5 mg once a week. Patient also made aware that medication is indicated for long-term management of obesity and the risk of weight regain following discontinuation of treatment and hence the importance of adhering to medical weight loss plan.  We demonstrated use of device and patient using teach back method was able to demonstrate proper technique.  Associated Conditions Impacted by Obesity Treatment  Prediabetes Assessment & Plan: Most recent A1c is  Lab Results  Component Value Date   HGBA1C 5.8 02/28/2023   HGBA1C 5.8 (H) 07/31/2021    Patient aware of disease state and risk of progression. This may contribute to abnormal cravings, fatigue and diabetic complications without having diabetes.   We have discussed treatment options which include: losing 7 to 10% of body weight, increasing physical activity to a goal of 150 minutes a week at moderate intensity.  Advised to maintain a diet low on simple and processed carbohydrates.  She may also be a candidate for pharmacoprophylaxis with metformin or incretin mimetic.  Patient had been prescribed Wegovy by [facility] weight management program and is requesting to resume medication.  She will be started on Wegovy 0.5 mg once a week   Orders: -     YNWGNF; Inject 0.5 mg into the skin once a week.  Dispense: 2 mL; Refill: 0  Class 1 obesity with serious comorbidity and body mass index (BMI) of 31.0 to 31.9 in adult, unspecified obesity type Assessment & Plan: See obesity treatment plan  Orders: -     AOZHYQ; Inject 0.5 mg into the skin once a week.  Dispense: 2 mL; Refill: 0  Transaminitis Assessment & Plan: I reviewed her history she had mild elevations in AST ALT sometime last year this  apparently improved with some medication changes she is not really clear what she was taking at the time.  She denies any alcohol risk factors for viral hepatitis.  I also reviewed abdominal imaging has been completed and there is no signs of fatty liver.  Her most recent liver enzymes are all within normal limits.  I recommend monitoring for now.  She continues to work on implementing reduced calorie nutrition plan and losing weight.   Abnormal food appetite Assessment & Plan: She has increased orexigenic signaling, impaired satiety and inhibitory control. This is secondary to an abnormal energy regulation system and pathological neurohormonal pathways characteristic of excess adiposity.  She also has a history of traumatic brain injury which may have affected her energy regulation system.  In addition to nutritional and behavioral strategies she benefits from pharmacotherapy.  She had been prescribed Wegovy at previous weight management center and was approved by her insurance she will be restarted at 0.5 mg once a week.       Objective   Physical Exam:  Blood pressure 119/69, pulse 70, height 5\' 3"  (1.6 m), weight 174 lb (78.9 kg), SpO2 96%. Body mass index is 30.82 kg/m.  General: She is overweight, cooperative, alert, well developed, and in no acute distress. PSYCH: Has normal mood, affect and thought process.   HEENT: EOMI, sclerae are anicteric. Lungs: Normal breathing effort, no conversational  dyspnea. Extremities: No edema.  Neurologic: No gross sensory or motor deficits. No tremors or fasciculations noted.    Diagnostic Data Reviewed:  BMET    Component Value Date/Time   NA 141 02/28/2023 1028   NA 142 07/24/2022 0856   K 4.2 02/28/2023 1028   CL 103 02/28/2023 1028   CO2 29 02/28/2023 1028   GLUCOSE 109 (H) 02/28/2023 1028   BUN 15 02/28/2023 1028   BUN 12 07/24/2022 0856   CREATININE 1.09 02/28/2023 1028   CALCIUM 9.7 02/28/2023 1028   GFRNONAA >60 12/06/2022 1742    GFRAA >60 12/10/2019 1600   Lab Results  Component Value Date   HGBA1C 5.8 02/28/2023   HGBA1C 5.8 (H) 07/31/2021   Lab Results  Component Value Date   INSULIN 8.7 03/14/2023   Lab Results  Component Value Date   TSH 0.973 07/12/2022   CBC    Component Value Date/Time   WBC 9.5 02/28/2023 1028   RBC 5.23 (H) 02/28/2023 1028   HGB 14.0 02/28/2023 1028   HGB 13.0 04/27/2022 0735   HCT 43.2 02/28/2023 1028   HCT 39.7 04/27/2022 0735   PLT 232.0 02/28/2023 1028   PLT 239 04/27/2022 0735   MCV 82.6 02/28/2023 1028   MCV 80 04/27/2022 0735   MCH 26.1 12/06/2022 1742   MCHC 32.3 02/28/2023 1028   RDW 15.1 02/28/2023 1028   RDW 16.0 (H) 04/27/2022 0735   Iron Studies No results found for: "IRON", "TIBC", "FERRITIN", "IRONPCTSAT" Lipid Panel     Component Value Date/Time   CHOL 165 02/28/2023 1028   CHOL 193 02/27/2021 1013   TRIG 116.0 02/28/2023 1028   HDL 56.10 02/28/2023 1028   HDL 60 02/27/2021 1013   CHOLHDL 3 02/28/2023 1028   VLDL 23.2 02/28/2023 1028   LDLCALC 86 02/28/2023 1028   LDLCALC 115 (H) 02/27/2021 1013   LDLDIRECT 111 (H) 02/27/2021 1013   Hepatic Function Panel     Component Value Date/Time   PROT 8.0 02/28/2023 1028   PROT 7.3 07/24/2022 0856   ALBUMIN 4.6 02/28/2023 1028   ALBUMIN 4.7 07/24/2022 0856   AST 19 02/28/2023 1028   ALT 6 02/28/2023 1028   ALKPHOS 99 02/28/2023 1028   BILITOT 0.7 02/28/2023 1028   BILITOT 0.3 07/24/2022 0856      Component Value Date/Time   TSH 0.973 07/12/2022 1002   Nutritional Lab Results  Component Value Date   VD25OH 59.4 03/14/2023   VD25OH 30.38 03/04/2020    Follow-Up   Return in about 3 weeks (around 05/02/2023) for For Weight Mangement with Dr. Rikki Spearing.Marland Kitchen She was informed of the importance of frequent follow up visits to maximize her success with intensive lifestyle modifications for her multiple health conditions.  Attestation Statement   Reviewed by clinician on day of visit:  allergies, medications, problem list, medical history, surgical history, family history, social history, and previous encounter notes.   I have spent 40 minutes in the care of the patient today including: preparing to see patient (e.g. review and interpretation of tests, old notes ), obtaining and/or reviewing separately obtained history, performing a medically appropriate examination or evaluation, counseling and educating the patient, ordering medications, test or procedures, documenting clinical information in the electronic or other health care record, and independently interpreting results and communicating results to the patient, family, or caregiver   Worthy Rancher, MD

## 2023-04-11 NOTE — Assessment & Plan Note (Signed)
See obesity treatment plan

## 2023-04-11 NOTE — Assessment & Plan Note (Signed)
I reviewed her history she had mild elevations in AST ALT sometime last year this apparently improved with some medication changes she is not really clear what she was taking at the time.  She denies any alcohol risk factors for viral hepatitis.  I also reviewed abdominal imaging has been completed and there is no signs of fatty liver.  Her most recent liver enzymes are all within normal limits.  I recommend monitoring for now.  She continues to work on implementing reduced calorie nutrition plan and losing weight.

## 2023-04-11 NOTE — Assessment & Plan Note (Signed)
Most recent A1c is  Lab Results  Component Value Date   HGBA1C 5.8 02/28/2023   HGBA1C 5.8 (H) 07/31/2021    Patient aware of disease state and risk of progression. This may contribute to abnormal cravings, fatigue and diabetic complications without having diabetes.   We have discussed treatment options which include: losing 7 to 10% of body weight, increasing physical activity to a goal of 150 minutes a week at moderate intensity.  Advised to maintain a diet low on simple and processed carbohydrates.  She may also be a candidate for pharmacoprophylaxis with metformin or incretin mimetic.  Patient had been prescribed Wegovy by [facility] weight management program and is requesting to resume medication.  She will be started on Wegovy 0.5 mg once a week

## 2023-04-12 ENCOUNTER — Ambulatory Visit (INDEPENDENT_AMBULATORY_CARE_PROVIDER_SITE_OTHER): Payer: Medicare PPO

## 2023-04-12 DIAGNOSIS — Z7901 Long term (current) use of anticoagulants: Secondary | ICD-10-CM

## 2023-04-12 LAB — POCT INR: INR: 2.5 (ref 2.0–3.0)

## 2023-04-12 NOTE — Patient Instructions (Addendum)
Pre visit review using our clinic review tool, if applicable. No additional management support is needed unless otherwise documented below in the visit note.  Continue 1 tablet daily except take 1 1/2 tablets on Sunday. Recheck in 3 weeks.

## 2023-04-12 NOTE — Progress Notes (Signed)
Continue 1 tablet daily except take 1 1/2 tablets on Sunday. Recheck in 3 weeks.

## 2023-04-15 ENCOUNTER — Telehealth: Payer: Self-pay | Admitting: Adult Health

## 2023-04-15 DIAGNOSIS — F319 Bipolar disorder, unspecified: Secondary | ICD-10-CM | POA: Diagnosis not present

## 2023-04-15 DIAGNOSIS — R419 Unspecified symptoms and signs involving cognitive functions and awareness: Secondary | ICD-10-CM | POA: Diagnosis not present

## 2023-04-15 DIAGNOSIS — Z79899 Other long term (current) drug therapy: Secondary | ICD-10-CM | POA: Diagnosis not present

## 2023-04-15 NOTE — Telephone Encounter (Signed)
Pt brought in paperwork to be filled out and mailed. I didn't know what to charge since it is 2 pages that we need to fill out

## 2023-04-16 ENCOUNTER — Telehealth (INDEPENDENT_AMBULATORY_CARE_PROVIDER_SITE_OTHER): Payer: Self-pay

## 2023-04-16 ENCOUNTER — Ambulatory Visit: Payer: Medicare PPO | Admitting: Adult Health

## 2023-04-16 NOTE — Telephone Encounter (Signed)
Spoke to pt and advised will complete PA and if not covered, she can discuss alternative medications at her next office visit.

## 2023-04-18 NOTE — Telephone Encounter (Signed)
 Error

## 2023-04-18 NOTE — Telephone Encounter (Signed)
 Pt has apt tomorrow, I will complete the part we can and pt can get at her apt.

## 2023-04-19 ENCOUNTER — Telehealth (INDEPENDENT_AMBULATORY_CARE_PROVIDER_SITE_OTHER): Payer: Self-pay | Admitting: Adult Health

## 2023-04-19 DIAGNOSIS — Z0389 Encounter for observation for other suspected diseases and conditions ruled out: Secondary | ICD-10-CM

## 2023-04-19 NOTE — Progress Notes (Signed)
 Patient no show appointment. ? ?

## 2023-04-23 ENCOUNTER — Telehealth (INDEPENDENT_AMBULATORY_CARE_PROVIDER_SITE_OTHER): Payer: Self-pay | Admitting: Internal Medicine

## 2023-04-23 ENCOUNTER — Ambulatory Visit (INDEPENDENT_AMBULATORY_CARE_PROVIDER_SITE_OTHER): Payer: Medicare PPO | Admitting: Internal Medicine

## 2023-04-23 NOTE — Telephone Encounter (Signed)
Spoke to pt, it is her Reginal Lutes that is waiting to be picked up

## 2023-04-23 NOTE — Telephone Encounter (Signed)
Patient called in to say her prior Emily Rice was approved but when she spoke with pharmacy she was told it will still cost $500 after Insurance pays. Patient is asking if you can prescribe a different medicine in its place. Something less expensive.

## 2023-04-23 NOTE — Telephone Encounter (Signed)
Emily Rice,  this patient called in wanting to talk with you. She has some questions about her Wegovy. Verified phone and it is correct.

## 2023-04-24 ENCOUNTER — Ambulatory Visit: Payer: Medicare PPO | Admitting: Adult Health

## 2023-04-24 ENCOUNTER — Encounter (INDEPENDENT_AMBULATORY_CARE_PROVIDER_SITE_OTHER): Payer: Self-pay

## 2023-04-24 ENCOUNTER — Encounter: Payer: Self-pay | Admitting: Adult Health

## 2023-04-24 ENCOUNTER — Telehealth: Payer: Self-pay | Admitting: Adult Health

## 2023-04-24 DIAGNOSIS — F09 Unspecified mental disorder due to known physiological condition: Secondary | ICD-10-CM | POA: Diagnosis not present

## 2023-04-24 DIAGNOSIS — F310 Bipolar disorder, current episode hypomanic: Secondary | ICD-10-CM

## 2023-04-24 DIAGNOSIS — G4700 Insomnia, unspecified: Secondary | ICD-10-CM

## 2023-04-24 DIAGNOSIS — F411 Generalized anxiety disorder: Secondary | ICD-10-CM

## 2023-04-24 NOTE — Telephone Encounter (Signed)
Patient had some concerns about Caplyta she is unsure if insurance will pay for the increased dosage. She thinks she may need a PA. Pls rtc to discuss [phone]
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
## 2023-04-25 ENCOUNTER — Ambulatory Visit: Payer: Medicare PPO | Admitting: Nurse Practitioner

## 2023-04-25 NOTE — Telephone Encounter (Signed)
Caplyta only comes in one strength 42 mg. I will check on a PA.

## 2023-04-25 NOTE — Telephone Encounter (Signed)
Pt received a letter from [facility] stating that Caplyta 42 mg was increased to level 5, she can not purchase it. She can't remember the price but was too expensive. She said that they told her she requires a PA.  She says that the Caplyta 42 mg really works for her, she does not want to try another medication.   [facility] said that when the medication was ordered in Jan it was cx because she never approved the the copay. The medication for a 30 day supply is $411.

## 2023-04-25 NOTE — Progress Notes (Unsigned)
Electrophysiology Office Note:   Date:  04/26/2023  ID:  Emily Rice, DOB 11/21/1960, MRN [id number]  Primary Cardiologist: Tessa Lerner, DO Primary Heart Failure: None Electrophysiologist: Lanier Prude, MD      History of Present Illness:   Emily Rice is a 63 y.o. female with h/o AF, HTN, TBI/SAH, recurrent DVT, sickle cell trait, Bipolar disorder seen today for routine electrophysiology followup.   Since last being seen in our clinic the patient reports she feels she has been having more AF as of late.  She is interested in ablation. She reports her prior concerns for her dental infection turned out not to be infection at all per a repeat evaluation by periodontist.  She wears a smart watch and reports she has AF at least 2x per week.  She has other procedures she wants to get done but states no one will operate on her because of her AF and slow HR's.  When she is in AF, she feels it in her throat and gets short of breath.   She denies chest pain, palpitations, dyspnea, PND, orthopnea, nausea, vomiting, dizziness, syncope, edema, weight gain, or early satiety.   Review of systems complete and found to be negative unless listed in HPI.   EP Information / Studies Reviewed:    EKG is ordered today. Personal review as below.  EKG Interpretation Date/Time:  Friday April 26 2023 16:13:37 EST Ventricular Rate:  52 PR Interval:  140 QRS Duration:  86 QT Interval:  470 QTC Calculation: 437 R Axis:   47  Text Interpretation: Sinus bradycardia with Premature atrial complexes Confirmed by Canary Brim ([id number]) on 04/26/2023 4:39:47 PM   Studies:  ECHO 02/2021 > LVEF 60%, mild MR/TR  LE Venous Duplex 03/2023 > negative for DVT   Arrhythmia / AAD AF     Risk Assessment/Calculations:    CHA2DS2-VASc Score = 2   This indicates a 2.2% annual risk of stroke. The patient's score is based upon: CHF History: 0 HTN History: 1 Diabetes History: 0 Stroke History: 0 Vascular  Disease History: 0 Age Score: 0 Gender Score: 1              Physical Exam:   VS:  BP 130/80   Pulse (!) 52   Ht 5' 3.5" (1.613 m)   Wt 177 lb 12.8 oz (80.6 kg)   LMP  (LMP Unknown)   SpO2 99%   BMI 31.00 kg/m    Wt Readings from Last 3 Encounters:  04/26/23 177 lb 12.8 oz (80.6 kg)  04/11/23 174 lb (78.9 kg)  03/29/23 182 lb (82.6 kg)     GEN: Well nourished, well developed in no acute distress NECK: No JVD; No carotid bruits CARDIAC: Regular rate and rhythm, no murmurs, rubs, gallops RESPIRATORY:  Clear to auscultation without rales, wheezing or rhonchi  ABDOMEN: Soft, non-tender, non-distended EXTREMITIES:  No edema; No deformity   ASSESSMENT AND PLAN:    Paroxysmal Atrial Fibrillation  CHA2DS2-VASc 2 -continue cardizem CD 360 mg daily   -OAC for stroke prophylaxis  -will have patient follow up with Dr. Lalla Brothers for ablation  -offered a cardiac monitor but patient left mid appointment due to multiple phone calls from her husband   Secondary Hypercoagulable State  -continue coumadin   Hx Recurrent DVT Sickle Cell Trait  -OAC as above   Hx Dental Infection  Ablation previously held due to concern for infection -previously discussed ablation but had to be put on hold due to  concern for active infection involving the gums/teeth > pt reports this was not the case and was a misdiagnosis by her dentist. Saw a periodontist.   Follow up with Dr. Lalla Brothers  for further discussion regarding ablation.   Patient left mid appointment after multiple phone calls / texts from her husband.  She asked to be rescheduled with Dr. Lalla Brothers to continue discussion.   Signed, Canary Brim, NP-C, AGACNP-BC [facility] - Electrophysiology  04/26/2023, 4:57 PM

## 2023-04-26 ENCOUNTER — Ambulatory Visit: Payer: Medicare PPO

## 2023-04-26 ENCOUNTER — Ambulatory Visit: Payer: Medicare PPO | Attending: Pulmonary Disease | Admitting: Pulmonary Disease

## 2023-04-26 ENCOUNTER — Encounter: Payer: Self-pay | Admitting: Pulmonary Disease

## 2023-04-26 VITALS — BP 130/80 | HR 52 | Ht 63.5 in | Wt 177.8 lb

## 2023-04-26 DIAGNOSIS — Z86718 Personal history of other venous thrombosis and embolism: Secondary | ICD-10-CM

## 2023-04-26 DIAGNOSIS — I480 Paroxysmal atrial fibrillation: Secondary | ICD-10-CM | POA: Diagnosis not present

## 2023-04-26 DIAGNOSIS — Z7901 Long term (current) use of anticoagulants: Secondary | ICD-10-CM

## 2023-04-26 LAB — POCT INR: INR: 2.2 (ref 2.0–3.0)

## 2023-04-26 NOTE — Progress Notes (Signed)
Continue 1 tablet daily except take 1 1/2 tablets on Sunday. Recheck in 4 weeks.

## 2023-04-26 NOTE — Patient Instructions (Addendum)
Pre visit review using our clinic review tool, if applicable. No additional management support is needed unless otherwise documented below in the visit note.  Continue 1 tablet daily except take 1 1/2 tablets on Sunday. Recheck in 4 weeks.

## 2023-04-26 NOTE — Patient Instructions (Signed)
PATIENT NEEDED TO LEAVE MID APPOINTMENT

## 2023-04-29 NOTE — Telephone Encounter (Signed)
Pt is only allowed a 30 day supply at a time no 90 day, see previous phone notes. She can use a co-pay savings card if [employer] only.

## 2023-04-30 NOTE — Telephone Encounter (Signed)
 Lvm to rc

## 2023-05-01 ENCOUNTER — Other Ambulatory Visit: Payer: Self-pay

## 2023-05-01 ENCOUNTER — Encounter (INDEPENDENT_AMBULATORY_CARE_PROVIDER_SITE_OTHER): Payer: Self-pay

## 2023-05-01 DIAGNOSIS — F411 Generalized anxiety disorder: Secondary | ICD-10-CM

## 2023-05-01 MED ORDER — DIAZEPAM 5 MG PO TABS: 5.0000 mg | ORAL_TABLET | Freq: Two times a day (BID) | ORAL | 0 refills | Status: DC

## 2023-05-01 NOTE — Telephone Encounter (Signed)
I spoke with Emily Rice she said she made a mistake and says that she hasn't had to pay for the medication.   Needs rf on valium. Will pend lf 1/2

## 2023-05-02 ENCOUNTER — Ambulatory Visit (INDEPENDENT_AMBULATORY_CARE_PROVIDER_SITE_OTHER): Payer: Medicare PPO | Admitting: Internal Medicine

## 2023-05-02 ENCOUNTER — Telehealth: Payer: Self-pay

## 2023-05-02 ENCOUNTER — Encounter (INDEPENDENT_AMBULATORY_CARE_PROVIDER_SITE_OTHER): Payer: Self-pay | Admitting: Internal Medicine

## 2023-05-02 VITALS — BP 117/56 | HR 67 | Temp 98.1°F | Ht 63.0 in | Wt 173.0 lb

## 2023-05-02 DIAGNOSIS — R7401 Elevation of levels of liver transaminase levels: Secondary | ICD-10-CM

## 2023-05-02 DIAGNOSIS — E66811 Obesity, class 1: Secondary | ICD-10-CM

## 2023-05-02 DIAGNOSIS — R7303 Prediabetes: Secondary | ICD-10-CM | POA: Diagnosis not present

## 2023-05-02 DIAGNOSIS — T50905A Adverse effect of unspecified drugs, medicaments and biological substances, initial encounter: Secondary | ICD-10-CM | POA: Diagnosis not present

## 2023-05-02 DIAGNOSIS — Z6831 Body mass index (BMI) 31.0-31.9, adult: Secondary | ICD-10-CM | POA: Diagnosis not present

## 2023-05-02 DIAGNOSIS — R635 Abnormal weight gain: Secondary | ICD-10-CM

## 2023-05-02 MED ORDER — METFORMIN HCL ER 500 MG PO TB24: 500.0000 mg | ORAL_TABLET | Freq: Two times a day (BID) | ORAL | 0 refills | Status: DC

## 2023-05-02 NOTE — Telephone Encounter (Signed)
Copied from CRM [id number]. Topic: Appointments - Scheduling Inquiry for Clinic >> May 02, 2023 11:22 AM Ernst Spell wrote: Reason for CRM: pt called to r/s her appt with Carollee Herter on 05/24/23. However, she explained that she needs a 4pm appt and that Carollee Herter works around her schedule to accommodate her request. Please callback to schedule appt as a there wasn't a 4pm slot on any day of her schedule.

## 2023-05-02 NOTE — Telephone Encounter (Signed)
[facility] coumadin clinic apt for 3/11. Gave pt again the direct number to the coumadin clinic. Pt verbalized understanding.
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
## 2023-05-03 ENCOUNTER — Ambulatory Visit: Payer: Medicare PPO

## 2023-05-06 ENCOUNTER — Telehealth: Payer: Self-pay | Admitting: Adult Health

## 2023-05-06 NOTE — Telephone Encounter (Signed)
 It looks like PA was denied and needs an appeal for Caplyta. Previous PA expired in Dec.

## 2023-05-06 NOTE — Telephone Encounter (Signed)
 Pt notified that 30-day RF is approved, which is how the RF were sent to [facility].

## 2023-05-06 NOTE — Telephone Encounter (Signed)
 No its approved for 30 day only. The denial was for a 90 day.

## 2023-05-06 NOTE — Telephone Encounter (Signed)
 PT called and said that she received a denial letter on caplyta 42 mg.  Only has about week left of medicines. So maybe a PA needs to be done

## 2023-05-07 ENCOUNTER — Telehealth: Payer: Self-pay

## 2023-05-07 NOTE — Telephone Encounter (Signed)
 I reached out to [facility] to get some clarification on her cost for Caplyta, a 30 day supply is $411.53 not for the whole year. It is a Tier 5 medication. With her plan she has to pay 25% of the medication which is over $2000.00.   I will get her samples for now but she will have to change medications I am sure.

## 2023-05-07 NOTE — Telephone Encounter (Signed)
 Patient reporting [facility] said the cost of Caplyta for one year is $411.53. She can pay 40% up front and pay the rest over 11 months. Patient said it is still too expensive. She likes the medication, but can't afford it. ? If she can apply for patient assistance.

## 2023-05-07 NOTE — Telephone Encounter (Signed)
 Contacted [facility] since there seemed to be an issue with pt filling her Caplyta, they report they have filled it for a 30 day supply nothing else needed from office but they have been trying to reach out to pt before they can ship to her. She needs to contact [phone].

## 2023-05-07 NOTE — Telephone Encounter (Signed)
 Since pt has insurance she will not qualify for Pt. Assistance, they are pretty strict. I am not sure if her husband works or not but we will check the qualifications to make sure.

## 2023-05-08 ENCOUNTER — Other Ambulatory Visit: Payer: Self-pay

## 2023-05-08 DIAGNOSIS — I480 Paroxysmal atrial fibrillation: Secondary | ICD-10-CM

## 2023-05-08 MED ORDER — DILTIAZEM HCL ER COATED BEADS 360 MG PO CP24: 360.0000 mg | ORAL_CAPSULE | Freq: Every morning | ORAL | 0 refills | Status: DC

## 2023-05-12 ENCOUNTER — Telehealth: Payer: Self-pay | Admitting: Cardiology

## 2023-05-12 ENCOUNTER — Other Ambulatory Visit: Payer: Self-pay | Admitting: Adult Health

## 2023-05-12 DIAGNOSIS — F310 Bipolar disorder, current episode hypomanic: Secondary | ICD-10-CM

## 2023-05-12 DIAGNOSIS — I480 Paroxysmal atrial fibrillation: Secondary | ICD-10-CM

## 2023-05-12 MED ORDER — DILTIAZEM HCL ER COATED BEADS 360 MG PO CP24: 360.0000 mg | ORAL_CAPSULE | Freq: Every morning | ORAL | 0 refills | Status: DC

## 2023-05-12 NOTE — Telephone Encounter (Signed)
 Patient called the answering service today concerned that she has run out of her diltiazem. She orders her medications through an mail-order service, and her next refill of diltiazem does not come until 3/5. She asked for a 5 day supply of cardizem. I sent the prescription to her requested pharmacy   Jonita Albee, PA-C 05/12/2023 3:09 PM

## 2023-05-15 ENCOUNTER — Telehealth (INDEPENDENT_AMBULATORY_CARE_PROVIDER_SITE_OTHER): Payer: Self-pay | Admitting: Internal Medicine

## 2023-05-15 NOTE — Telephone Encounter (Signed)
 Patient is asking about metformin saying that she is taking it as prescribed, but is not seeing any appetite suppression.  The patient is asking if you could go up in the dose to 1,000 mg BID.  She is eating healthy and all her proteins, but going over her calories.  Patient is only available on March 13, has appointment on March 17.  Explained to patient that I would send message.  Please advise.

## 2023-05-15 NOTE — Telephone Encounter (Signed)
 This patient of Dr. Rikki Spearing would like for you to call her, she didn't tell me why. Verified phone number is correct.

## 2023-05-16 ENCOUNTER — Telehealth: Payer: Self-pay | Admitting: Cardiology

## 2023-05-16 ENCOUNTER — Telehealth (INDEPENDENT_AMBULATORY_CARE_PROVIDER_SITE_OTHER): Payer: Self-pay | Admitting: Internal Medicine

## 2023-05-16 ENCOUNTER — Other Ambulatory Visit (INDEPENDENT_AMBULATORY_CARE_PROVIDER_SITE_OTHER): Payer: Self-pay | Admitting: Internal Medicine

## 2023-05-16 DIAGNOSIS — E66811 Obesity, class 1: Secondary | ICD-10-CM

## 2023-05-16 DIAGNOSIS — R7303 Prediabetes: Secondary | ICD-10-CM

## 2023-05-16 MED ORDER — METFORMIN HCL ER 500 MG PO TB24: 1000.0000 mg | ORAL_TABLET | Freq: Two times a day (BID) | ORAL | 0 refills | Status: DC

## 2023-05-16 NOTE — Telephone Encounter (Signed)
 Patient called in to say she has had to cancel her 3/17 visit with Dr. Rikki Spearing. States she will do the intermittent fasting to see if that helps with her appetite.

## 2023-05-16 NOTE — Telephone Encounter (Signed)
 Patient says she would like a call back to discuss 2/14 EKG.

## 2023-05-16 NOTE — Progress Notes (Signed)
 Medication increased to 1000 mg twice a day

## 2023-05-16 NOTE — Telephone Encounter (Signed)
 Pt called questioning the PACs seen on her EKG.  We discussed PACs and what they are and that they are not new either.  Pt appreciates the return call on this.  She also mentions about getting scheduled for an ablation and that she was scheduled at one point but it had to be postponed.  She would like to get a date on hold (sees Dr. Lalla Brothers beginning of April) and would like to go end of May if possible.  Aware I am forwarding to RN and procedure scheduler.  Aware it may be next week before someone follows up with her about procedure date. Pt agreeable to plan.

## 2023-05-17 NOTE — Telephone Encounter (Signed)
 Patient wants to confirm results will be sent to her PCP, Cheryll Dessert NP.

## 2023-05-17 NOTE — Telephone Encounter (Signed)
 Spoke with the patient and she would like to wait until she sees Dr. Lalla Brothers to schedule her ablation.

## 2023-05-17 NOTE — Telephone Encounter (Signed)
 Spoke with pt and advised her PCP is a part of [facility] and she has access to all of her records through [employer].  Pt verbalizes understanding and thanked Charity fundraiser for the callback.

## 2023-05-21 ENCOUNTER — Ambulatory Visit: Payer: Medicare PPO

## 2023-05-24 ENCOUNTER — Ambulatory Visit: Payer: Medicare PPO

## 2023-05-27 ENCOUNTER — Other Ambulatory Visit

## 2023-05-27 ENCOUNTER — Encounter (INDEPENDENT_AMBULATORY_CARE_PROVIDER_SITE_OTHER): Payer: Self-pay | Admitting: Internal Medicine

## 2023-05-27 ENCOUNTER — Telehealth: Payer: Self-pay | Admitting: Adult Health

## 2023-05-27 ENCOUNTER — Telehealth (INDEPENDENT_AMBULATORY_CARE_PROVIDER_SITE_OTHER): Payer: Self-pay | Admitting: Internal Medicine

## 2023-05-27 ENCOUNTER — Telehealth: Payer: Self-pay

## 2023-05-27 ENCOUNTER — Encounter: Payer: Self-pay | Admitting: Family Medicine

## 2023-05-27 ENCOUNTER — Ambulatory Visit (INDEPENDENT_AMBULATORY_CARE_PROVIDER_SITE_OTHER): Payer: Medicare PPO | Admitting: Internal Medicine

## 2023-05-27 ENCOUNTER — Other Ambulatory Visit: Payer: Self-pay | Admitting: Family Medicine

## 2023-05-27 ENCOUNTER — Ambulatory Visit (INDEPENDENT_AMBULATORY_CARE_PROVIDER_SITE_OTHER): Admitting: Family Medicine

## 2023-05-27 ENCOUNTER — Ambulatory Visit: Payer: Self-pay | Admitting: Family Medicine

## 2023-05-27 VITALS — BP 110/70 | HR 50 | Temp 98.4°F | Resp 18 | Ht 63.0 in | Wt 174.0 lb

## 2023-05-27 DIAGNOSIS — I480 Paroxysmal atrial fibrillation: Secondary | ICD-10-CM

## 2023-05-27 DIAGNOSIS — N1831 Chronic kidney disease, stage 3a: Secondary | ICD-10-CM | POA: Diagnosis not present

## 2023-05-27 DIAGNOSIS — R410 Disorientation, unspecified: Secondary | ICD-10-CM

## 2023-05-27 DIAGNOSIS — R0602 Shortness of breath: Secondary | ICD-10-CM | POA: Diagnosis not present

## 2023-05-27 DIAGNOSIS — N3001 Acute cystitis with hematuria: Secondary | ICD-10-CM

## 2023-05-27 LAB — POCT URINALYSIS DIPSTICK
Bilirubin, UA: NEGATIVE
Blood, UA: POSITIVE
Glucose, UA: NEGATIVE
Ketones, UA: POSITIVE
Nitrite, UA: NEGATIVE
Protein, UA: NEGATIVE
Spec Grav, UA: 1.02 (ref 1.010–1.025)
Urobilinogen, UA: NEGATIVE U/dL — AB
pH, UA: 5 (ref 5.0–8.0)

## 2023-05-27 MED ORDER — NITROFURANTOIN MONOHYD MACRO 100 MG PO CAPS
100.0000 mg | ORAL_CAPSULE | Freq: Two times a day (BID) | ORAL | 0 refills | Status: AC
Start: 2023-05-27 — End: 2023-06-01

## 2023-05-27 NOTE — Telephone Encounter (Signed)
 Pt called 9:50 am. Last couple of days very confused. Only new med is Metformin. Interaction with meds for mental health? RTC [phone]  APT 4/28

## 2023-05-27 NOTE — Telephone Encounter (Signed)
 Copied from CRM [id number]. Topic: Clinical - Medical Advice >> May 27, 2023  8:20 AM Kathryne Eriksson wrote: Reason for CRM: Requesting A Call Back >> May 27, 2023  8:21 AM Kathryne Eriksson wrote: Patient states she is needing to speak with Avanell Shackleton, NP-C nurse in regards to an urgent matter. Patient's call back number is [phone]

## 2023-05-27 NOTE — Telephone Encounter (Signed)
 Chief Complaint: Confusion Symptoms: Confusion over last two weeks, worsened in last 2 days Frequency: 2 weeks Pertinent Negatives: Patient denies numbness or tingling of arms or legs, weakness, headache Disposition: [] ED /[] Urgent Care (no appt availability in office) / [x] Appointment(In office/virtual)/ []  [facility]/ [] Home Care/ [] Refused Recommended Disposition /[] [facility]/ []  Follow-up with PCP Additional Notes: Patient called to make appt for today for evaluation of confusion that has been onset for roughly two weeks, but worsened over last two days. Patient states she is having forgetfulness of what her husband is telling her. Patient states only recent change in medication is increase in Metformin. Patient has also reached out to psychiatry for appt as well. Patient denies weakness, numbness, tingling of arms or legs, headache, burning with urination, back pain, increased urination. Patient states she has been having her normal tremors a little more lately. Patient states her husband will bring her in today but she needs an afternoon appt. Appt made for further evaluation.   Copied from CRM [id number]. Topic: Clinical - Red Word Triage >> May 27, 2023 10:04 AM Eunice Blase wrote: Red Word that prompted transfer to Nurse Triage: Forgetting, memory loss, confusion. Reason for Disposition  [1] Acting confused (e.g., disoriented, slurred speech) AND [2] brief (now gone)    Worsened over two weeks - no other neurological symptoms  Answer Assessment - Initial Assessment Questions 1. LEVEL OF CONSCIOUSNESS: "How is he (she, the patient) acting right now?" (e.g., alert-oriented, confused, lethargic, stuporous, comatose)     Alert and oriented 2. ONSET: "When did the confusion start?"  (minutes, hours, days)     Two weeks ago 3. PATTERN "Does this come and go, or has it been constant since it started?"  "Is it present now?"     Growing more confused, husband has noticed 4.  ALCOHOL or DRUGS: "Has he been drinking alcohol or taking any drugs?"      No 5. NARCOTIC MEDICINES: "Has he been receiving any narcotic medications?" (e.g., morphine, Vicodin)     No 6. CAUSE: "What do you think is causing the confusion?"      Patient is unsure, states only change in medication is increase in Metformin 7. OTHER SYMPTOMS: "Are there any other symptoms?" (e.g., difficulty breathing, headache, fever, weakness)     No  Protocols used: Confusion - Delirium-A-AH

## 2023-05-27 NOTE — Telephone Encounter (Signed)
 Pt called office and was able to schedule an appointment for today to be evaluated for the confusion she has been having

## 2023-05-27 NOTE — Telephone Encounter (Signed)
 Patient called stating she has been having a lot of confusion for the last couple days. She is not able to remember certain things, she had difficult remembering the name of our office. She was concerned it may be a side effect of the increase of Metformin. Per patient she started out taking 500 mg  twice a day and then it was increased to 1000 mg twice a day on May 16, 2023. Told patient Metformin does not usually display those kind of side effects and I would let Dr. Rikki Spearing know. However she need to call her PCP and request an appointment for her to be seen today to be evaluated. Informed her they should have some same day appointments for her to be seen today to be evaluated for the confusion. Patient verbalized understanding.

## 2023-05-27 NOTE — Telephone Encounter (Signed)
 Asked patient if she measured her BS at home and she does not. She asked the prescribing physician at [facility] and reports that he said at the dose she is on it wouldn't cause hypoglycemia. She is going to see PCP today and will have labs. Asked her to let us know what PCP says and lab results.

## 2023-05-27 NOTE — Telephone Encounter (Signed)
 Patient has called our office twice at [facility]. Please contact patient to evaluate the confusion she is having.
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
## 2023-05-28 ENCOUNTER — Other Ambulatory Visit: Payer: Self-pay | Admitting: Family Medicine

## 2023-05-28 ENCOUNTER — Ambulatory Visit

## 2023-05-29 ENCOUNTER — Telehealth: Payer: Self-pay

## 2023-05-29 ENCOUNTER — Other Ambulatory Visit: Payer: Self-pay

## 2023-05-29 ENCOUNTER — Other Ambulatory Visit

## 2023-05-29 DIAGNOSIS — Z7901 Long term (current) use of anticoagulants: Secondary | ICD-10-CM

## 2023-05-29 DIAGNOSIS — R410 Disorientation, unspecified: Secondary | ICD-10-CM

## 2023-05-29 DIAGNOSIS — N1831 Chronic kidney disease, stage 3a: Secondary | ICD-10-CM

## 2023-05-29 DIAGNOSIS — I480 Paroxysmal atrial fibrillation: Secondary | ICD-10-CM

## 2023-05-29 DIAGNOSIS — R0602 Shortness of breath: Secondary | ICD-10-CM

## 2023-05-29 NOTE — Addendum Note (Signed)
 Addended by: Theressa Stamps on: 05/29/2023 04:43 PM   Modules accepted: Orders

## 2023-05-29 NOTE — Telephone Encounter (Signed)
 Pt reports she has to go to clinic today for a lab draw and is requesting to see coumadin clinic nurse today instead of 3/21. Advised coumadin clinic nurse is at a different clinic today but a lab INR could be drawn with her other labs. Advised will not receive the results today but will have them tomorrow and f/u with her for warfarin dosing instructions. Pt verbalized understanding and was appreciative of the help.   Placed lab INR order.

## 2023-05-30 ENCOUNTER — Encounter: Payer: Self-pay | Admitting: Family Medicine

## 2023-05-30 ENCOUNTER — Ambulatory Visit (INDEPENDENT_AMBULATORY_CARE_PROVIDER_SITE_OTHER): Payer: Self-pay

## 2023-05-30 DIAGNOSIS — Z7901 Long term (current) use of anticoagulants: Secondary | ICD-10-CM | POA: Diagnosis not present

## 2023-05-30 LAB — CBC WITH DIFFERENTIAL/PLATELET
Basophils Absolute: 0.1 10*3/uL (ref 0.0–0.1)
Basophils Relative: 0.8 % (ref 0.0–3.0)
Eosinophils Absolute: 0.1 10*3/uL (ref 0.0–0.7)
Eosinophils Relative: 1.8 % (ref 0.0–5.0)
HCT: 41.4 % (ref 36.0–46.0)
Hemoglobin: 13.5 g/dL (ref 12.0–15.0)
Lymphocytes Relative: 33.1 % (ref 12.0–46.0)
Lymphs Abs: 2.6 10*3/uL (ref 0.7–4.0)
MCHC: 32.6 g/dL (ref 30.0–36.0)
MCV: 82.1 fl (ref 78.0–100.0)
Monocytes Absolute: 0.4 10*3/uL (ref 0.1–1.0)
Monocytes Relative: 5.4 % (ref 3.0–12.0)
Neutro Abs: 4.6 10*3/uL (ref 1.4–7.7)
Neutrophils Relative %: 58.9 % (ref 43.0–77.0)
Platelets: 211 10*3/uL (ref 150.0–400.0)
RBC: 5.05 Mil/uL (ref 3.87–5.11)
RDW: 15.3 % (ref 11.5–15.5)
WBC: 7.8 10*3/uL (ref 4.0–10.5)

## 2023-05-30 LAB — COMPREHENSIVE METABOLIC PANEL
ALT: 4 U/L (ref 0–35)
AST: 19 U/L (ref 0–37)
Albumin: 4.7 g/dL (ref 3.5–5.2)
Alkaline Phosphatase: 67 U/L (ref 39–117)
BUN: 11 mg/dL (ref 6–23)
CO2: 30 meq/L (ref 19–32)
Calcium: 9.3 mg/dL (ref 8.4–10.5)
Chloride: 105 meq/L (ref 96–112)
Creatinine, Ser: 0.95 mg/dL (ref 0.40–1.20)
GFR: 65.19 mL/min (ref >60.00)
Glucose, Bld: 90 mg/dL (ref 70–99)
Potassium: 4.4 meq/L (ref 3.5–5.1)
Sodium: 142 meq/L (ref 135–145)
Total Bilirubin: 0.3 mg/dL (ref 0.2–1.2)
Total Protein: 7.8 g/dL (ref 6.0–8.3)

## 2023-05-30 LAB — PROTIME-INR
INR: 2.9 ratio — ABNORMAL HIGH (ref 0.8–1.0)
Prothrombin Time: 29.1 s — ABNORMAL HIGH (ref 9.6–13.1)

## 2023-05-30 LAB — BRAIN NATRIURETIC PEPTIDE: Pro B Natriuretic peptide (BNP): 66 pg/mL (ref 0.0–100.0)

## 2023-05-30 LAB — TSH: TSH: 0.56 u[IU]/mL (ref 0.35–5.50)

## 2023-05-30 NOTE — Progress Notes (Signed)
 Continue 1 tablet daily except take 1 1/2 tablets on Sunday. Recheck in 4 weeks.  Contacted pt and advised of result, dosing and scheduled for next apt.  She called back and said she does not think the date of her next coumadin clinic apt will work. She is going to check if there are any labs near her that she could have a lab INR drawn when needed rather than going to the clinic. Advised to contact coumadin clinic if she does so an order could be placed. Pt verbalized understanding.

## 2023-05-30 NOTE — Patient Instructions (Addendum)
 Pre visit review using our clinic review tool, if applicable. No additional management support is needed unless otherwise documented below in the visit note.  Continue 1 tablet daily except take 1 1/2 tablets on Sunday. Recheck in 4 weeks.

## 2023-05-31 ENCOUNTER — Telehealth: Payer: Self-pay | Admitting: Adult Health

## 2023-05-31 ENCOUNTER — Ambulatory Visit

## 2023-05-31 LAB — URINE CULTURE: Result:: NO GROWTH

## 2023-05-31 NOTE — Telephone Encounter (Signed)
 LF diazepam 2/21, #60.

## 2023-05-31 NOTE — Telephone Encounter (Signed)
 Pt  called asking for a refill on her valium. She is so anxious she has been taking 3 a day. She said that she could take something different. She also wants to know what other medicine she can take in place of the caplyta since insurance will not cover it. Please give her a call at [phone]

## 2023-06-03 ENCOUNTER — Telehealth (INDEPENDENT_AMBULATORY_CARE_PROVIDER_SITE_OTHER): Payer: Self-pay | Admitting: Internal Medicine

## 2023-06-03 NOTE — Telephone Encounter (Signed)
 Good morning!  Patient finished antibiotics yesterday and has resumed metformin last night (1,0000mg  x2 daily. She would like to double check that this is okay. She would like phone call to confirm.  Thanks!

## 2023-06-03 NOTE — Telephone Encounter (Signed)
 Nothing further needed. Pt was contacted with warfarin dosing at time of lab result.

## 2023-06-04 ENCOUNTER — Other Ambulatory Visit: Payer: Self-pay

## 2023-06-04 DIAGNOSIS — F411 Generalized anxiety disorder: Secondary | ICD-10-CM

## 2023-06-04 MED ORDER — DIAZEPAM 5 MG PO TABS: 5.0000 mg | ORAL_TABLET | Freq: Two times a day (BID) | ORAL | 0 refills | Status: DC

## 2023-06-05 ENCOUNTER — Telehealth: Payer: Self-pay

## 2023-06-05 ENCOUNTER — Other Ambulatory Visit: Payer: Self-pay | Admitting: Adult Health

## 2023-06-05 MED ORDER — BUSPIRONE HCL 30 MG PO TABS: 30.0000 mg | ORAL_TABLET | Freq: Two times a day (BID) | ORAL | 0 refills | Status: DC

## 2023-06-05 NOTE — Telephone Encounter (Signed)
 Patient notified of Rx for Buspar, of Caplyta samples, and that I had asked scheduling to call her to schedule an earlier appt with Almira Coaster.

## 2023-06-05 NOTE — Telephone Encounter (Signed)
 Buspar 30 mg BID sent to her pharmacy

## 2023-06-05 NOTE — Telephone Encounter (Signed)
 Pt said the Valium is not working to control her anxiety. She said she is running thru the house and yesterday dropped her glass coffee pot and broke it because she is so anxious. She said her husband is trying to help her and he doesn't know what to do. She said the bipolar is up and down, mood is up and down. I pulled her samples of Caplyta until you can decide what to do next. She is asking for another benzo, doesn't have to be Valium. I reviewed with her why you would not increase the Valium.   She said I am not telling you everything for you to realize how distressed she is. She has FU the end of April. Will have admin call and schedule an earlier appt.

## 2023-06-10 ENCOUNTER — Telehealth: Payer: Self-pay | Admitting: Cardiology

## 2023-06-10 ENCOUNTER — Telehealth: Payer: Self-pay

## 2023-06-10 ENCOUNTER — Encounter: Payer: Self-pay | Admitting: Adult Health

## 2023-06-10 ENCOUNTER — Telehealth (INDEPENDENT_AMBULATORY_CARE_PROVIDER_SITE_OTHER): Admitting: Adult Health

## 2023-06-10 DIAGNOSIS — G4700 Insomnia, unspecified: Secondary | ICD-10-CM | POA: Diagnosis not present

## 2023-06-10 DIAGNOSIS — F411 Generalized anxiety disorder: Secondary | ICD-10-CM | POA: Diagnosis not present

## 2023-06-10 DIAGNOSIS — F09 Unspecified mental disorder due to known physiological condition: Secondary | ICD-10-CM | POA: Diagnosis not present

## 2023-06-10 DIAGNOSIS — F310 Bipolar disorder, current episode hypomanic: Secondary | ICD-10-CM

## 2023-06-10 DIAGNOSIS — I480 Paroxysmal atrial fibrillation: Secondary | ICD-10-CM

## 2023-06-10 MED ORDER — LORAZEPAM 0.5 MG PO TABS: 0.5000 mg | ORAL_TABLET | Freq: Every day | ORAL | 0 refills | Status: DC

## 2023-06-10 MED ORDER — DILTIAZEM HCL ER COATED BEADS 360 MG PO CP24: 360.0000 mg | ORAL_CAPSULE | Freq: Every morning | ORAL | 0 refills | Status: DC

## 2023-06-10 NOTE — Telephone Encounter (Signed)
*  STAT* If patient is at the pharmacy, call can be transferred to refill team.   1. Which medications need to be refilled? (please list name of each medication and dose if known) diltiazem (CARDIZEM CD) 360 MG 24 hr capsule    2. Would you like to learn more about the convenience, safety, & potential cost savings by using the [facility]?      3. Are you open to using the [facility] (Type [facility]. ).   4. Which pharmacy/location (including street and city if local pharmacy) is medication to be sent to?[id number] - [place], [place] - [address]    5. Do they need a 30 day or 90 day supply? 30

## 2023-06-10 NOTE — Telephone Encounter (Signed)
 Pt called to try to line up her PCP apt with her coumadin clinic apt on 4/22. Pt has apt currently with PCP on 4/17, which is a Thursday. Coumadin clinic at [facility] is Tuesdays and Fridays. Cannot draw INR lab on 4/17 at PCP apt because result will not be received until the following week, due to clinic being closed on 4/18 for Easter holiday. Advised it would not be best practice to use an INR result that is 68 days old for any warfarin dosing. Cannot move PCP apt to the day of coumadin clinic on 4/22 due to PCP only has morning clinic and pt has to have apts as late in the day as possible for transportation from her husband.  Pt agreed to keep both apts, with PCP on 4/17 and coumadin clinic oin 4/22.

## 2023-06-10 NOTE — Telephone Encounter (Signed)
 Pt's medication was sent to pt's pharmacy as requested. Confirmation received.
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
## 2023-06-11 ENCOUNTER — Ambulatory Visit (INDEPENDENT_AMBULATORY_CARE_PROVIDER_SITE_OTHER): Payer: Medicare PPO | Admitting: Internal Medicine

## 2023-06-12 ENCOUNTER — Encounter (INDEPENDENT_AMBULATORY_CARE_PROVIDER_SITE_OTHER): Payer: Self-pay | Admitting: Family Medicine

## 2023-06-12 ENCOUNTER — Ambulatory Visit: Payer: Medicare PPO | Admitting: Cardiology

## 2023-06-12 ENCOUNTER — Ambulatory Visit (INDEPENDENT_AMBULATORY_CARE_PROVIDER_SITE_OTHER): Admitting: Family Medicine

## 2023-06-12 ENCOUNTER — Other Ambulatory Visit: Payer: Self-pay | Admitting: Adult Health

## 2023-06-12 VITALS — BP 134/81 | HR 45 | Temp 97.6°F | Ht 63.0 in | Wt 174.0 lb

## 2023-06-12 DIAGNOSIS — R7303 Prediabetes: Secondary | ICD-10-CM | POA: Diagnosis not present

## 2023-06-12 DIAGNOSIS — Z6831 Body mass index (BMI) 31.0-31.9, adult: Secondary | ICD-10-CM

## 2023-06-12 DIAGNOSIS — F310 Bipolar disorder, current episode hypomanic: Secondary | ICD-10-CM | POA: Diagnosis not present

## 2023-06-12 DIAGNOSIS — E66811 Obesity, class 1: Secondary | ICD-10-CM | POA: Diagnosis not present

## 2023-06-12 NOTE — Progress Notes (Signed)
 Emily Rice, D.O.  ABFM, ABOM Specializing in Clinical Bariatric Medicine  Office located at: [address] [place], [place]  [address]   Assessment and Plan:  No orders of the defined types were placed in this encounter.   Medications Discontinued During This Encounter  Medication Reason   diazepam (VALIUM) 5 MG tablet      No orders of the defined types were placed in this encounter.     FOR THE DISEASE OF OBESITY:  Morbid obesity (HCC) - CURRENT 30.83 Class 1 obesity with serious comorbidity and body mass index (BMI) of 31.0 to 31.9 in adult, unspecified obesity type Assessment & Plan: Since last office visit on 05/02/2023, patient's muscle mass has decreased by 1.8 lbs. Fat mass has increased by 2.8 lbs. Total body water has increased by 8.2 lbs.  Counseling done on how various foods will affect these numbers and how to maximize success  Total lbs lost to date: 5 lbs Total weight loss percentage to date: 2.79%    Recommended Dietary Goals [name] is currently in the action stage of change. As such, her goal is to continue weight management plan.  She has agreed to: continue current plan   Behavioral Intervention We discussed the following today: increasing lean protein intake to established goals, decreasing simple carbohydrates , and continue to work on implementation of reduced calorie nutritional plan  Additional resources provided today: None  Evidence-based interventions for health behavior change were utilized today including the discussion of self monitoring techniques, problem-solving barriers and SMART goal setting techniques.   Regarding patient's less desirable eating habits and patterns, we employed the technique of small changes.   Pt will specifically work on: n/a   Recommended Physical Activity Goals [name] has been advised to work up to 150 minutes of moderate intensity aerobic activity a week and strengthening exercises 2-3 times per  week for cardiovascular health, weight loss maintenance and preservation of muscle mass.   She has agreed to :  Continue current level of physical activity    Pharmacotherapy We both agreed to : continue with nutritional and behavioral strategies and current medication regimen   FOR ASSOCIATED CONDITIONS ADDRESSED TODAY:  Prediabetes Assessment & Plan: Marketia is taking Metformin XR 500 mg once daily. Pt is tolerating medication well, denies any GI upset. Hunger/cravings well controlled. Pt endorses journaling and getting about 80+g of protein daily. Pt obtained CMP, kidney function was WNL. Recommended pt take Metformin one tablet twice daily as prescribed. Continue closely adhering to prudent nutrition plan. Will monitor condition closely as it pertains to her weight loss journey.    Bipolar affective disorder, current episode hypomanic [facility]) Assessment & Plan: Pristine presented today with mild confusion and slurring of words. She reports recently seeing psychiatrist and received a change in her Benzodiazepine. She started the new dose yesterday. I reached out to psychiatry team to recommend checking on pt with new dose and accompanied symptoms. Recommend pt also f/up with psychiatry about medication. Will monitor closely alongside specialist.   Follow up:   Return in 5 weeks (on 07/16/2023). She was informed of the importance of frequent follow up visits to maximize her success with intensive lifestyle modifications for her multiple health conditions.  Subjective:   Chief complaint: Obesity [name] is here to discuss her progress with her obesity treatment plan. She is on the the Vegetarian Plan and states she is following her eating plan approximately 75% of the time. She states she is walking on  the treadmill 30-60 minutes 5-7 days per week.  Interval History:  Emily Rice is here for a follow up office visit. This is my first time seeing this pt, she usually is seen by Dr. Rikki Spearing.  Since last OV on 05/02/2023, pt is up 1 lb. She is eating foods like brown rice, sweet potatoes, and fruits. Emily Rice reports feeling a bit "off" today. She had confusion regarding her transportation getting here. Additionally, pt had troubles standing up prior to seeing me. She attributes this to her hx of DVT in her right lower extremity and experiencing lingering pain.   Pharmacotherapy for weight loss: She is currently taking Metformin XR 1000 mg twice daily.   Review of Systems:  Pertinent positives were addressed with patient today.  Reviewed by clinician on day of visit: allergies, medications, problem list, medical history, surgical history, family history, social history, and previous encounter notes.  Weight Summary and Biometrics   Weight Lost Since Last Visit: 0  Weight Gained Since Last Visit: 1lb   Vitals Temp: 97.6 F (36.4 C) BP: 134/81 Pulse Rate: (!) 45 SpO2: 98 %   Anthropometric Measurements Height: 5\' 3"  (1.6 m) Weight: 174 lb (78.9 kg) BMI (Calculated): 30.83 Weight at Last Visit: 173lb Weight Lost Since Last Visit: 0 Weight Gained Since Last Visit: 1lb Starting Weight: 179lb Total Weight Loss (lbs): 5 lb (2.268 kg) Peak Weight: 203lb   Body Composition  Body Fat %: 42.5 % Fat Mass (lbs): 74.2 lbs Muscle Mass (lbs): 95.2 lbs Total Body Water (lbs): 76.4 lbs Visceral Fat Rating : 11   Other Clinical Data Fasting: yes Labs: no Today's Visit #: 4 Starting Date: 03/14/23    Objective:   PHYSICAL EXAM: Blood pressure 134/81, pulse (!) 45, temperature 97.6 F (36.4 C), height 5\' 3"  (1.6 m), weight 174 lb (78.9 kg), SpO2 98%. Body mass index is 30.82 kg/m.  General: she is overweight, cooperative and in no acute distress. PSYCH: Has normal mood, affect and thought process.   HEENT: EOMI, sclerae are anicteric. Lungs: Normal breathing effort, no conversational dyspnea. Extremities: Moves * 4 Neurologic: A and O * 3, good  insight  DIAGNOSTIC DATA REVIEWED: BMET    Component Value Date/Time   NA 142 05/29/2023 1645   NA 142 07/24/2022 0856   K 4.4 05/29/2023 1645   CL 105 05/29/2023 1645   CO2 30 05/29/2023 1645   GLUCOSE 90 05/29/2023 1645   BUN 11 05/29/2023 1645   BUN 12 07/24/2022 0856   CREATININE 0.95 05/29/2023 1645   CALCIUM 9.3 05/29/2023 1645   GFRNONAA >60 12/06/2022 1742   GFRAA >60 12/10/2019 1600   Lab Results  Component Value Date   HGBA1C 5.8 02/28/2023   HGBA1C 5.8 (H) 07/31/2021   Lab Results  Component Value Date   INSULIN 8.7 03/14/2023   Lab Results  Component Value Date   TSH 0.56 05/29/2023   CBC    Component Value Date/Time   WBC 7.8 05/29/2023 1645   RBC 5.05 05/29/2023 1645   HGB 13.5 05/29/2023 1645   HGB 13.0 04/27/2022 0735   HCT 41.4 05/29/2023 1645   HCT 39.7 04/27/2022 0735   PLT 211.0 05/29/2023 1645   PLT 239 04/27/2022 0735   MCV 82.1 05/29/2023 1645   MCV 80 04/27/2022 0735   MCH 26.1 12/06/2022 1742   MCHC 32.6 05/29/2023 1645   RDW 15.3 05/29/2023 1645   RDW 16.0 (H) 04/27/2022 0735   Iron Studies No results found for: "  IRON", "TIBC", "FERRITIN", "IRONPCTSAT" Lipid Panel     Component Value Date/Time   CHOL 165 02/28/2023 1028   CHOL 193 02/27/2021 1013   TRIG 116.0 02/28/2023 1028   HDL 56.10 02/28/2023 1028   HDL 60 02/27/2021 1013   CHOLHDL 3 02/28/2023 1028   VLDL 23.2 02/28/2023 1028   LDLCALC 86 02/28/2023 1028   LDLCALC 115 (H) 02/27/2021 1013   LDLDIRECT 111 (H) 02/27/2021 1013   Hepatic Function Panel     Component Value Date/Time   PROT 7.8 05/29/2023 1645   PROT 7.3 07/24/2022 0856   ALBUMIN 4.7 05/29/2023 1645   ALBUMIN 4.7 07/24/2022 0856   AST 19 05/29/2023 1645   ALT 4 05/29/2023 1645   ALKPHOS 67 05/29/2023 1645   BILITOT 0.3 05/29/2023 1645   BILITOT 0.3 07/24/2022 0856      Component Value Date/Time   TSH 0.56 05/29/2023 1645   Nutritional Lab Results  Component Value Date   VD25OH 59.4  03/14/2023   VD25OH 30.38 03/04/2020    Attestations:   I, Camryn Mix, acting as a Stage manager for [employer], DO., have compiled all relevant documentation for today's office visit on behalf of Thomasene Lot, DO, while in the presence of [employer], DO.  Reviewed by clinician on day of visit: allergies, medications, problem list, medical history, surgical history, family history, social history, and previous encounter notes pertinent to patient's obesity diagnosis. I have spent 40 minutes in the care of the patient today including: preparing to see patient (e.g. review and interpretation of tests, old notes ), obtaining and/or reviewing separately obtained history, performing a medically appropriate examination or evaluation, counseling and educating the patient, ordering medications, test or procedures, documenting clinical information in the electronic or other health care record, and independently interpreting results and communicating results to the patient, family, or caregiver   I have reviewed the above documentation for accuracy and completeness, and I agree with the above. Emily Rice, D.O.  The 21st Century Cures Act was signed into law in 2016 which includes the topic of electronic health records.  This provides immediate access to information in MyChart.  This includes consultation notes, operative notes, office notes, lab results and pathology reports.  If you have any questions about what you read please let us know at your next visit so we can discuss your concerns and take corrective action if need be.  We are right here with you.

## 2023-06-13 ENCOUNTER — Ambulatory Visit (INDEPENDENT_AMBULATORY_CARE_PROVIDER_SITE_OTHER): Admitting: Internal Medicine

## 2023-06-13 ENCOUNTER — Other Ambulatory Visit (INDEPENDENT_AMBULATORY_CARE_PROVIDER_SITE_OTHER): Payer: Self-pay | Admitting: Internal Medicine

## 2023-06-13 ENCOUNTER — Ambulatory Visit: Admitting: Physician Assistant

## 2023-06-13 DIAGNOSIS — R7303 Prediabetes: Secondary | ICD-10-CM

## 2023-06-13 DIAGNOSIS — E66811 Obesity, class 1: Secondary | ICD-10-CM

## 2023-06-18 ENCOUNTER — Ambulatory Visit (INDEPENDENT_AMBULATORY_CARE_PROVIDER_SITE_OTHER): Admitting: Internal Medicine

## 2023-06-18 ENCOUNTER — Telehealth (INDEPENDENT_AMBULATORY_CARE_PROVIDER_SITE_OTHER): Payer: Self-pay | Admitting: Internal Medicine

## 2023-06-18 NOTE — Telephone Encounter (Signed)
 Patient stated she had a urinary tract infection and Dr. Rikki Spearing decreased her Metformin down to 500 mg in am and 500mg  at night. Patient states she was told the UTI was not from her Metformin and she is requesting her Metformin to be increased back up to 1000 mg in am and 1000 mg at night because she is not losing weight.

## 2023-06-19 ENCOUNTER — Ambulatory Visit: Payer: Self-pay

## 2023-06-19 ENCOUNTER — Ambulatory Visit: Payer: Self-pay | Admitting: Family Medicine

## 2023-06-19 NOTE — Telephone Encounter (Signed)
 Needs appt for medication to be sent, has appt tomrrow 4/10

## 2023-06-19 NOTE — Telephone Encounter (Signed)
  Chief Complaint: urinary symptoms Symptoms: bladder pain, urinary frequency/urgency Frequency: x 2 days Pertinent Negatives: Patient denies back or flank pain,  Disposition: [] ED /[] Urgent Care (no appt availability in office) / [x] Appointment(In office/virtual)/ []  [facility]/ [] Home Care/ [x] Refused Recommended Disposition /[] [facility]/ []  Follow-up with PCP Additional Notes: Patient seen on 05/27/23 at office visit with FNP Deliah Boston. Treated for UTI with macrobid twice daily for 5 days. She states at the time she did not feel symptomatic and she completed the antibiotic. Patient states she does not have transportation to bring her today or tomorrow. Advised she should come in for visit. Patient asking if the provider can send in an antibiotic and something for the bladder pain. Patient verbalizes understanding to call back if she can find a ride today or for any worsening symptoms.  Copied from CRM [id number]. Topic: Clinical - Red Word Triage >> Jun 19, 2023  9:06 AM Fredrich Romans wrote: Red Word that prompted transfer to Nurse Triage: bladder pain,pain with urination,frequency ,urgency Reason for Disposition  Age > 50 years  Answer Assessment - Initial Assessment Questions 1. SEVERITY: "How bad is the pain?"  (e.g., Scale 1-10; mild, moderate, or severe)   - MILD (1-3): complains slightly about urination hurting   - MODERATE (4-7): interferes with normal activities     - SEVERE (8-10): excruciating, unwilling or unable to urinate because of the pain      Mild bladder pain. She states the pain woke her up during the night.  2. FREQUENCY: "How many times have you had painful urination today?"      3-4 times.  3. PATTERN: "Is pain present every time you urinate or just sometimes?"      Sometimes.  4. ONSET: "When did the painful urination start?"      This morning.  5. FEVER: "Do you have a fever?" If Yes, ask: "What is your temperature, how was it measured, and  when did it start?"     Denies.  6. PAST UTI: "Have you had a urine infection before?" If Yes, ask: "When was the last time?" and "What happened that time?"      Yes, treated on 05/27/23 with Macrobid.  7. CAUSE: "What do you think is causing the painful urination?"  (e.g., UTI, scratch, Herpes sore)     UTI.  8. OTHER SYMPTOMS: "Do you have any other symptoms?" (e.g., blood in urine, flank pain, genital sores, urgency, vaginal discharge)     Urinary frequency and urgency.  9. PREGNANCY: "Is there any chance you are pregnant?" "When was your last menstrual period?"     N/A.  Protocols used: Urination Pain - Female-A-AH

## 2023-06-19 NOTE — Telephone Encounter (Signed)
 Chief Complaint: Bladder pain started last night  Symptoms: Urinary frequency Pertinent Negatives: Patient denies blood in urine, flank pain  Disposition:  [x] Appointment (In office)  Additional Notes: Pt spoke with another nurse today for Nurse Triage. Review note.   Pt states for the past couple of days she has noticed bladder pain and urinary frequency. Pt states she told nurse earlier she did not have transportation for an appointment today or tomorrow, but patient now has transportation for an appointment tomorrow. Pt scheduled for an appointment tomorrow at PCP office. This RN educated pt on new-worsening symptoms and when to call back/seek emergent care. Pt verbalized understanding and agrees to plan.    Copied from CRM [id number]. Topic: Clinical - Red Word Triage >> Jun 19, 2023 10:04 AM Emily Rice wrote: Patient calling back to connect with triage nurses.  Reason for Disposition  Urinating more frequently than usual (i.e., frequency)  Answer Assessment - Initial Assessment Questions SYMPTOM: "What's the main symptom you're concerned about?" (e.g., frequency, incontinence)     Urinary frequency, bladder pain ONSET: "When did the  bladder pain  start?"     Last night CAUSE: "What do you think is causing the symptoms?"     UTI  Protocols used: Urinary Symptoms-A-AH

## 2023-06-20 ENCOUNTER — Ambulatory Visit (INDEPENDENT_AMBULATORY_CARE_PROVIDER_SITE_OTHER): Admitting: Emergency Medicine

## 2023-06-20 ENCOUNTER — Encounter: Payer: Self-pay | Admitting: Emergency Medicine

## 2023-06-20 VITALS — BP 128/82 | HR 48 | Temp 98.2°F | Ht 63.0 in | Wt 174.0 lb

## 2023-06-20 DIAGNOSIS — R300 Dysuria: Secondary | ICD-10-CM | POA: Insufficient documentation

## 2023-06-20 DIAGNOSIS — N3001 Acute cystitis with hematuria: Secondary | ICD-10-CM | POA: Diagnosis not present

## 2023-06-20 DIAGNOSIS — R748 Abnormal levels of other serum enzymes: Secondary | ICD-10-CM

## 2023-06-20 LAB — POC URINALSYSI DIPSTICK (AUTOMATED)
Bilirubin, UA: NEGATIVE
Blood, UA: NEGATIVE
Glucose, UA: NEGATIVE
Ketones, UA: NEGATIVE
Nitrite, UA: NEGATIVE
Protein, UA: POSITIVE — AB
Spec Grav, UA: 1.01 (ref 1.010–1.025)
Urobilinogen, UA: 0.2 U/dL
pH, UA: 7 (ref 5.0–8.0)

## 2023-06-20 LAB — CK: Total CK: 316 U/L — ABNORMAL HIGH (ref 7–177)

## 2023-06-20 MED ORDER — CEFUROXIME AXETIL 250 MG PO TABS
250.0000 mg | ORAL_TABLET | Freq: Two times a day (BID) | ORAL | 0 refills | Status: DC
Start: 2023-06-20 — End: 2023-06-27
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
## 2023-06-20 NOTE — Patient Instructions (Signed)

## 2023-06-20 NOTE — Assessment & Plan Note (Signed)
 Most likely partially treated infection Positive urinalysis Urine sent for culture Recently treated with Macrobid for 5 days Recommend to start Ceftin 250 mg twice a day for 7 days pending culture Clinically stable.  No signs of pyelonephritis. ED precautions given Advised to contact the office if no better or worse during the next several days.

## 2023-06-20 NOTE — Assessment & Plan Note (Signed)
 Symptom management discussed. May take over-the-counter Azo as needed Advised to stay well-hydrated and start antibiotics today pending cultures. Advised to contact the office if no better or worse during the next several days.

## 2023-06-22 LAB — URINE CULTURE

## 2023-06-24 ENCOUNTER — Encounter: Payer: Self-pay | Admitting: Emergency Medicine

## 2023-06-24 ENCOUNTER — Telehealth: Payer: Self-pay | Admitting: Family Medicine

## 2023-06-24 NOTE — Telephone Encounter (Signed)
 Pt saw Dr. Vedia Geralds 06/20/23

## 2023-06-24 NOTE — Telephone Encounter (Unsigned)
 Copied from CRM [id number]. Topic: Clinical - Lab/Test Results >> Jun 21, 2023  4:39 PM Star [place] wrote: Reason for CRM: patient calling for results of labs- please call [phone]

## 2023-06-25 ENCOUNTER — Ambulatory Visit: Payer: Medicare PPO | Admitting: Adult Health

## 2023-06-25 ENCOUNTER — Other Ambulatory Visit (INDEPENDENT_AMBULATORY_CARE_PROVIDER_SITE_OTHER): Payer: Self-pay

## 2023-06-25 DIAGNOSIS — R7303 Prediabetes: Secondary | ICD-10-CM

## 2023-06-25 DIAGNOSIS — E66811 Obesity, class 1: Secondary | ICD-10-CM

## 2023-06-25 MED ORDER — METFORMIN HCL ER 500 MG PO TB24: 1000.0000 mg | ORAL_TABLET | Freq: Two times a day (BID) | ORAL | 0 refills | Status: DC

## 2023-06-25 NOTE — Telephone Encounter (Signed)
 Sent 500 mg metformin: pt to take two 500 mg tabs (total of 1,000 mg) with food am and pm

## 2023-06-27 ENCOUNTER — Ambulatory Visit: Admitting: Family Medicine

## 2023-06-27 ENCOUNTER — Encounter: Payer: Self-pay | Admitting: Family Medicine

## 2023-06-27 ENCOUNTER — Other Ambulatory Visit: Payer: Self-pay | Admitting: Adult Health

## 2023-06-27 VITALS — BP 126/80 | HR 52 | Temp 97.6°F | Ht 63.0 in | Wt 171.0 lb

## 2023-06-27 DIAGNOSIS — Z86018 Personal history of other benign neoplasm: Secondary | ICD-10-CM

## 2023-06-27 DIAGNOSIS — I10 Essential (primary) hypertension: Secondary | ICD-10-CM

## 2023-06-27 DIAGNOSIS — Z86718 Personal history of other venous thrombosis and embolism: Secondary | ICD-10-CM | POA: Diagnosis not present

## 2023-06-27 DIAGNOSIS — Z7901 Long term (current) use of anticoagulants: Secondary | ICD-10-CM

## 2023-06-27 DIAGNOSIS — I480 Paroxysmal atrial fibrillation: Secondary | ICD-10-CM

## 2023-06-27 DIAGNOSIS — N3001 Acute cystitis with hematuria: Secondary | ICD-10-CM

## 2023-06-27 DIAGNOSIS — R7303 Prediabetes: Secondary | ICD-10-CM | POA: Diagnosis not present

## 2023-06-27 LAB — POC URINALSYSI DIPSTICK (AUTOMATED)
Bilirubin, UA: NEGATIVE
Glucose, UA: NEGATIVE
Ketones, UA: NEGATIVE
Nitrite, UA: NEGATIVE
Protein, UA: POSITIVE — AB
Spec Grav, UA: 1.025 (ref 1.010–1.025)
Urobilinogen, UA: 0.2 U/dL
pH, UA: 6 (ref 5.0–8.0)

## 2023-06-27 NOTE — Patient Instructions (Signed)
 Please go downstairs for prolactin level.   We will be in touch with your urine culture

## 2023-06-27 NOTE — Progress Notes (Signed)
 Subjective:     Patient ID: Emily Rice, female    DOB: [birth year], 63 y.o.   MRN: [id number]  Chief Complaint  Patient presents with   Medical Management of Chronic Issues    3 month f/u    HPI  History of Present Illness         She is here to follow up on chronic health conditions.    Other providers: Cardiologist- Dr. Albert Huff and Dr. Marven Slimmer EP Hematologist- for DVT work up OB/GYN- Dr. Meldon Sport  Psychiatrist- Mozingo, NP for bipolar disorder  Counselor  Vascular-  Neurologist- Dr. Gracie Lav  Neuropsychologist - Dr. Elmer Hackney  [facility]- Dr. Allie Area (started Wegovy )   She was here on 06/20/2023 for UTI and completed antibiotic. Would like to recheck urine. Symptoms have resolved.   Reports doing well overall. She started spin classes.   States she is having an atrial ablation soon  A-fib- diagnosed in 2014. No longer rate controlled.  States her pulse is b/t 30s-150s    Hx of recurrent DVT and PE. Hypercoagulable work up did not show clotting disorder.  States she has been referred to vascular and pain management. She was taking narcotic pain medication at one point.   Hepatitis C done in 2015 at [facility]   Vitamin D  def  States Dr. Arno Bibles referred her to the pain clinic states Tylenol  is working well.   States she had a colonoscopy in April 2018 at [facility] and that was her 2nd one. States they were both negative.  Denies family history of colon cancer   On warfarin indefinitely per hematologist.    Tremor- on carbidopa /levodopa    Wegovy  was not covered.  Started metformin  by Dr. Allie Area   Pituitary adenoma hx- requests labs.   Memory issues- recently saw Dr. Annette Barters     Married.  Does not drive  Is not working. Retired [employer] manager Due  Topic Date Due   Cervical Cancer Screening (HPV/Pap Cotest)  Never done   Pneumococcal Vaccine 60-61 Years old (3 of 3 - PCV) 09/03/2013    Past Medical History:   Diagnosis Date   A-fib (HCC)    Allergy    Anxiety    Bipolar disorder (HCC)    Chondromalacia patellae 12/14/2003   Depression    DVT (deep vein thrombosis) in pregnancy    DVT (deep venous thrombosis) (HCC) 2001 and 2002   DVT (deep venous thrombosis) (HCC)    x 2   Esophagitis 06/06/2016   GERD (gastroesophageal reflux disease)    Hyperlipidemia    Migraines    Pulmonary embolism (HCC) 2001   S/P cholecystectomy 09/11/2012   Sickle cell anemia (HCC)    Suicidal ideation 12/28/2008   Tremor     Past Surgical History:  Procedure Laterality Date   CHOLECYSTECTOMY  2014   CHOLECYSTECTOMY     HUMERUS IM NAIL Right 03/03/2020   Procedure: INTRAMEDULLARY (IM) NAIL HUMERAL;  Surgeon: Laneta Pintos, MD;  Location: [facility] OR;  Service: Orthopedics;  Laterality: Right;   OVARIAN CYST REMOVAL     TONSILLECTOMY      Family History  Problem Relation Age of Onset   Hypertension Mother    Stroke Father    Hypertension Father    Atrial fibrillation Father    Heart disease Father    Diabetes Sister    Hypertension Maternal Uncle    CAD Brother    ADD / ADHD Son  Depression Son    Anxiety disorder Daughter    BRCA 1/2 Neg Hx    Breast cancer Neg Hx     Social History   Socioeconomic History   Marital status: Married    Spouse name: Foster Im   Number of children: 2   Years of education: college   Highest education level: Master's degree (e.g., MA, MS, MEng, MEd, MSW, MBA)  Occupational History   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   Right-handed.   No daily use of caffeine.   Lives with husband.-2025      Social Drivers of Health   Financial Resource Strain: Low Risk  (03/29/2023)   Overall Financial Resource Strain (CARDIA)    Difficulty of Paying Living Expenses: Not hard at all  Food Insecurity: No Food Insecurity (03/29/2023)   Hunger Vital Sign    Worried About Running Out of Food in the Last Year: Never true    Ran Out of Food in the Last Year: Never true  Transportation Needs: No Transportation Needs (03/29/2023)   PRAPARE - Administrator, Civil Service (Medical): No    Lack of Transportation (Non-Medical): No  Recent Concern: Transportation Needs - Unmet Transportation Needs (02/28/2023)   PRAPARE - Transportation    Lack of Transportation (Medical): Yes    Lack of Transportation (Non-Medical): Patient declined  Physical Activity: Sufficiently Active (03/29/2023)   Exercise Vital Sign    Days of Exercise per Week: 5 days    Minutes of Exercise per Session: 60 min  Stress: Stress Concern Present (03/29/2023)   [employer] of Occupational Health - Occupational Stress Questionnaire    Feeling of Stress : To some extent  Social Connections: Moderately Isolated (03/29/2023)   Social Connection and Isolation Panel [NHANES]    Frequency of Communication with Friends and Family: More than three times a week    Frequency of Social Gatherings with Friends and Family: More than three times a week    Attends Religious Services: Never    Database administrator or Organizations: No    Attends Banker Meetings: Never    Marital Status: Married  Catering manager Violence: Not At Risk (03/29/2023)   Humiliation, Afraid, Rape, and Kick questionnaire    Fear of Current or Ex-Partner: No    Emotionally Abused: No    Physically Abused: No    Sexually Abused: No    Outpatient Medications Prior  to Visit  Medication Sig Dispense Refill   acetaminophen  (TYLENOL ) 325 MG tablet Take 2 tablets (650 mg total) by mouth every 6 (six) hours as needed for mild pain or moderate pain.     CAPLYTA  42 MG capsule TAKE 1 CAPSULE EVERY DAY 30 capsule 11   carbidopa -levodopa  (SINEMET  IR) 25-100 MG tablet Take one tablet in the am and one tablet in the pm 60  tablet 4   cetirizine (ZYRTEC) 10 MG tablet Take 10 mg by mouth daily as needed for allergies.     Cholecalciferol  75 MCG (3000 UT) TABS Take 3,000 Units by mouth daily. 30 tablet 0   diphenhydrAMINE  (BENADRYL  ALLERGY EXTRA STR) 50 MG tablet Take 1 tablet (50 mg total) by mouth once for 1 dose. Take 1 tablet 1 hour prior to CT scan. 1 tablet 0   escitalopram  (LEXAPRO ) 10 MG tablet TAKE 1 TABLET AT BEDTIME 90 tablet 3   fexofenadine (ALLEGRA) 180 MG tablet Take 180 mg by mouth as needed for allergies or rhinitis.     fluticasone (FLONASE) 50 MCG/ACT nasal spray Place 1 spray into both nostrils daily as needed for allergies.      lamoTRIgine  (LAMICTAL ) 200 MG tablet TAKE 1 TABLET TWICE DAILY 180 tablet 0   LORazepam  (ATIVAN ) 0.5 MG tablet Take 1 tablet (0.5 mg total) by mouth daily. 30 tablet 0   lumateperone  tosylate (CAPLYTA ) 42 MG capsule Take 1 capsule (42 mg total) by mouth daily. 30 capsule 0   Multiple Vitamin (MULTIVITAMIN WITH MINERALS) TABS tablet Take 1 tablet by mouth daily.     polyethylene glycol (MIRALAX  / GLYCOLAX ) 17 g packet Take 17 g by mouth 2 (two) times daily. (Patient taking differently: Take 17 g by mouth daily as needed for mild constipation.) 14 each 0   QUEtiapine  (SEROQUEL ) 50 MG tablet TAKE 1 TABLET AT BEDTIME 90 tablet 1   warfarin (COUMADIN ) 5 MG tablet Take 5 mg by mouth daily.     busPIRone  (BUSPAR ) 30 MG tablet Take 1 tablet (30 mg total) by mouth 2 (two) times daily. 60 tablet 0   diltiazem  (CARDIZEM  CD) 360 MG 24 hr capsule Take 1 capsule (360 mg total) by mouth every morning. 30 capsule 0   metFORMIN  (GLUCOPHAGE -XR)  500 MG 24 hr tablet Take 2 tablets (1,000 mg total) by mouth 2 (two) times daily with a meal. 120 tablet 0   cefUROXime  (CEFTIN ) 250 MG tablet Take 1 tablet (250 mg total) by mouth 2 (two) times daily with a meal for 7 days. (Patient not taking: Reported on 06/27/2023) 14 tablet 0   predniSONE  (DELTASONE ) 50 MG tablet Take 1 tablet 13 hours prior to CT scan, take 1 tablet 7 hours prior to CT scan, take 1 tablet 1 hour prior to CT scan. (Patient not taking: Reported on 06/20/2023) 3 tablet 0   No facility-administered medications prior to visit.    Allergies  Allergen Reactions   Hydromorphone  Other (See Comments)    Stroke-like symptoms. treated with Narcan; Tolerated Percocet in the past.   Iodinated Contrast Media Other (See Comments)    Reaction not fully known   Prochlorperazine Swelling and Other (See Comments)    Compazine - tongue swelling    Review of Systems  Constitutional:  Negative for chills and fever.  Respiratory:  Negative for shortness of breath.   Cardiovascular:  Positive for palpitations. Negative for chest pain and leg swelling.  Gastrointestinal:  Negative for abdominal pain, constipation, diarrhea, nausea and vomiting.  Genitourinary:  Negative for dysuria, frequency, hematuria and urgency.  Neurological:  Negative for dizziness, sensory change, focal weakness and headaches.  Psychiatric/Behavioral:  Positive for memory loss.        Objective:    Physical Exam Constitutional:      General: She is not in acute distress.    Appearance: She is not ill-appearing.  Eyes:     Extraocular Movements: Extraocular  movements intact.     Conjunctiva/sclera: Conjunctivae normal.  Cardiovascular:     Rate and Rhythm: Normal rate.  Pulmonary:     Effort: Pulmonary effort is normal.  Musculoskeletal:     Cervical back: Normal range of motion and neck supple.  Skin:    General: Skin is warm and dry.  Neurological:     General: No focal deficit present.     Mental  Status: She is alert and oriented to person, place, and time.     Motor: No weakness.     Coordination: Coordination normal.     Gait: Gait normal.  Psychiatric:        Mood and Affect: Mood normal.        Behavior: Behavior normal.        Thought Content: Thought content normal.      BP 126/80 (BP Location: Left Arm, Patient Position: Sitting)   Pulse (!) 52   Temp 97.6 F (36.4 C) (Temporal)   Ht 5\' 3"  (1.6 m)   Wt 171 lb (77.6 kg)   LMP  (LMP Unknown)   SpO2 98%   BMI 30.29 kg/m  Wt Readings from Last 3 Encounters:  07/01/23 167 lb (75.8 kg)  06/28/23 171 lb (77.6 kg)  06/27/23 171 lb (77.6 kg)       Assessment & Plan:   Problem List Items Addressed This Visit     Acute cystitis with hematuria   Asymptomatic. Completed antibiotic therapy.  UA inconclusive, appears to be resolved. Urine sent for culture.       Relevant Orders   POCT Urinalysis Dipstick (Automated) (Completed)   Urine Culture (Completed)   Benign essential hypertension - Primary   Controlled. Continue current therapy.       History of DVT (deep vein thrombosis)   History of pituitary adenoma   Prolactin ordered as requested.       Relevant Orders   Prolactin (Completed)   Long term (current) use of anticoagulants   Paroxysmal atrial fibrillation (HCC)   Continue current medications and follow up with cardiology       Prediabetes   Continue healthy diet. Follow up with [facility]       I have discontinued Imanie L. Dlouhy's predniSONE  and cefUROXime . I am also having her maintain her fluticasone, fexofenadine, acetaminophen , Cholecalciferol , multivitamin with minerals, polyethylene glycol, carbidopa -levodopa , warfarin, cetirizine, diphenhydrAMINE , escitalopram , lumateperone  tosylate, Caplyta , QUEtiapine , lamoTRIgine , and LORazepam .  No orders of the defined types were placed in this encounter.

## 2023-06-28 ENCOUNTER — Encounter: Payer: Self-pay | Admitting: Family Medicine

## 2023-06-28 ENCOUNTER — Encounter: Payer: Self-pay | Admitting: Pulmonary Disease

## 2023-06-28 ENCOUNTER — Ambulatory Visit: Attending: Cardiology | Admitting: Pulmonary Disease

## 2023-06-28 ENCOUNTER — Ambulatory Visit: Admitting: Student

## 2023-06-28 VITALS — BP 138/80 | HR 62 | Ht 63.0 in | Wt 171.0 lb

## 2023-06-28 DIAGNOSIS — K869 Disease of pancreas, unspecified: Secondary | ICD-10-CM

## 2023-06-28 DIAGNOSIS — I480 Paroxysmal atrial fibrillation: Secondary | ICD-10-CM | POA: Diagnosis not present

## 2023-06-28 LAB — URINE CULTURE: Result:: NO GROWTH

## 2023-06-28 LAB — PROLACTIN: Prolactin: 18.3 ng/mL

## 2023-06-28 MED ORDER — DILTIAZEM HCL ER COATED BEADS 360 MG PO CP24: 360.0000 mg | ORAL_CAPSULE | Freq: Every morning | ORAL | 5 refills | Status: DC

## 2023-06-28 NOTE — Progress Notes (Addendum)
 Electrophysiology Office Note:   Date:  06/28/2023  ID:  KLARISSA MCILVAIN, DOB 11-Nov-1960, MRN [id number]  Primary Cardiologist: Olinda Bertrand, DO Primary Heart Failure: None Electrophysiologist: Boyce Byes, MD      History of Present Illness:   Emily Rice is a 63 y.o. female with h/o AF, HTN, TBI/SAH, recurrent DVT, sickle cell trait, Bipolar disorder  seen today for routine electrophysiology followup.   Seen in Clinic on 04/26/23 and she left mid appointment.  At that time, she reported she feet she has been having more AF as of late.  She is interested in ablation. She reported her prior concerns for her dental infection turned out not to be infection at all per a repeat evaluation by periodontist.  She wears a smart watch and reports she has AF at least 2x per week.  She has other procedures she wants to get done but states no one will operate on her because of her AF and slow HR's.  When she is in AF, she feels it in her throat and gets short of breath.   Since last being seen in our clinic the patient reports doing well. She is interested in pursuing ablation with Dr. Marven Slimmer.  She states she knows when she is in AF as she will be short of breath, have a fullness in her neck/chest.  She feels as though she has these symptoms 3-4 times per week.  She denies chest pain, palpitations, dyspnea, PND, orthopnea, nausea, vomiting, dizziness, syncope, edema, weight gain, or early satiety.   Review of systems complete and found to be negative unless listed in HPI.   EP Information / Studies Reviewed:    EKG is ordered today. Personal review as below.  EKG Interpretation Date/Time:  Friday June 28 2023 15:44:22 EDT Ventricular Rate:  61 PR Interval:  132 QRS Duration:  86 QT Interval:  434 QTC Calculation: 436 R Axis:   57  Text Interpretation: Normal sinus rhythm Right atrial enlargement Confirmed by Creighton Doffing ([id number]) on 06/28/2023 3:46:21 PM   Studies:  ECHO 02/2021 >  LVEF 60%, mild MR/TR  LE Venous Duplex 03/2023 > negative for DVT   Arrhythmia / AAD AF    Risk Assessment/Calculations:    CHA2DS2-VASc Score = 2   This indicates a 2.2% annual risk of stroke. The patient's score is based upon: CHF History: 0 HTN History: 1 Diabetes History: 0 Stroke History: 0 Vascular Disease History: 0 Age Score: 0 Gender Score: 1              Physical Exam:   VS:  BP 138/80 (BP Location: Right Arm)   Pulse 62   Ht 5\' 3"  (1.6 m)   Wt 171 lb (77.6 kg)   LMP  (LMP Unknown)   SpO2 94%   BMI 30.29 kg/m    Wt Readings from Last 3 Encounters:  06/28/23 171 lb (77.6 kg)  06/27/23 171 lb (77.6 kg)  06/20/23 174 lb (78.9 kg)     GEN: Well nourished, well developed in no acute distress NECK: No JVD; No carotid bruits CARDIAC: Regular rate and rhythm, no murmurs, rubs, gallops RESPIRATORY:  Clear to auscultation without rales, wheezing or rhonchi  ABDOMEN: Soft, non-tender, non-distended EXTREMITIES:  No edema; No deformity   ASSESSMENT AND PLAN:    Paroxysmal Atrial Fibrillation  CHA2DS2-VASc 2 -cardizem  CD 360 mg daily  -OAC for stroke prophylaxis -EKG with NSR   -procedure reviewed with patient, risks / benefits -will discuss  with Dr. Marven Slimmer to determine if he would like to see her again in clinic vs schedule ablation & discuss with her the day of procedure  Secondary Hypercoagulable State  -continue Coumadin    Hx Dental Infection  Ablation previously held due to concern for infection -pt reports this was a misdiagnosis by her dentist and her periodontist cleared her   Follow up with Dr. Marven Slimmer  pending chart review > for appt vs schedule for ablation   Signed, Creighton Doffing, NP-C, AGACNP-BC  [facility] - Electrophysiology  06/28/2023, 4:23 PM

## 2023-06-28 NOTE — Patient Instructions (Signed)
 Medication Instructions:  Your physician recommends that you continue on your current medications as directed. Please refer to the Current Medication list given to you today.  *If you need a refill on your cardiac medications before your next appointment

## 2023-06-30 ENCOUNTER — Encounter: Payer: Self-pay | Admitting: Family Medicine

## 2023-07-01 ENCOUNTER — Ambulatory Visit (INDEPENDENT_AMBULATORY_CARE_PROVIDER_SITE_OTHER): Admitting: Internal Medicine

## 2023-07-01 ENCOUNTER — Encounter (INDEPENDENT_AMBULATORY_CARE_PROVIDER_SITE_OTHER): Payer: Self-pay | Admitting: Internal Medicine

## 2023-07-01 VITALS — BP 127/74 | HR 56 | Temp 98.5°F | Ht 63.0 in | Wt 167.0 lb

## 2023-07-01 DIAGNOSIS — R635 Abnormal weight gain: Secondary | ICD-10-CM

## 2023-07-01 DIAGNOSIS — Z6831 Body mass index (BMI) 31.0-31.9, adult: Secondary | ICD-10-CM | POA: Diagnosis not present

## 2023-07-01 DIAGNOSIS — E66811 Obesity, class 1: Secondary | ICD-10-CM | POA: Diagnosis not present

## 2023-07-01 DIAGNOSIS — R638 Other symptoms and signs concerning food and fluid intake: Secondary | ICD-10-CM | POA: Diagnosis not present

## 2023-07-01 DIAGNOSIS — Z86018 Personal history of other benign neoplasm: Secondary | ICD-10-CM | POA: Insufficient documentation

## 2023-07-01 DIAGNOSIS — R7303 Prediabetes: Secondary | ICD-10-CM

## 2023-07-01 MED ORDER — METFORMIN HCL ER 500 MG PO TB24: 1000.0000 mg | ORAL_TABLET | Freq: Two times a day (BID) | ORAL | 1 refills | Status: DC

## 2023-07-01 NOTE — Assessment & Plan Note (Signed)
Controlled.  Continue current therapy.  

## 2023-07-01 NOTE — Assessment & Plan Note (Signed)
 She has lost seven pounds since the last visit, following a pescatarian reduced-calorie nutrition plan 70-80% of the time. She tracks her calories, incorporates whole foods, ensures adequate protein intake, and maintains hydration. She exercises five days a week, combining strength, cardio, and yoga. Her body fat percentage decreased from 42% to 40%, with preserved muscle mass. Her BMI is now 29, down from a starting weight of 179 pounds in January. Weight loss is attributed to diet, exercise, and metformin , which helps prevent weight gain from her mental health medications, reduces visceral fat, and prevents diabetes. - Continue current diet and exercise regimen - Continue metformin  2 tablets twice a day - Refill metformin  prescription at [facility] - Encourage consistency in diet and exercise - Monitor weight weekly at home

## 2023-07-01 NOTE — Assessment & Plan Note (Signed)
 Patient is on Seroquel  as she also takes several anticholinergic drugs which may contribute to weight gain.  Continue metformin  for pharmacoprophylaxis

## 2023-07-01 NOTE — Assessment & Plan Note (Signed)
 Continue current medications and follow-up with cardiology

## 2023-07-01 NOTE — Assessment & Plan Note (Signed)
 Improved on nutritional strategies and metformin .  She has increased orexigenic signaling, impaired satiety and inhibitory control. This is secondary to an abnormal energy regulation system and pathological neurohormonal pathways characteristic of excess adiposity.  She also has a history of traumatic brain injury which may have affected her energy regulation system.  She benefits from ongoing pharmacotherapy

## 2023-07-01 NOTE — Assessment & Plan Note (Signed)
 Asymptomatic. Completed antibiotic therapy.  UA inconclusive, appears to be resolved. Urine sent for culture.

## 2023-07-01 NOTE — Progress Notes (Signed)
 Office: [phone]  /  Fax: [phone]  Weight Summary And Biometrics  Vitals Temp: 98.5 F (36.9 C) BP: 127/74 Pulse Rate: (!) 56 SpO2: 100 %   Anthropometric Measurements Height: 5\' 3"  (1.6 m) Weight: 167 lb (75.8 kg) BMI (Calculated): 29.59 Weight at Last Visit: 174 lb Weight Lost Since Last Visit: 7 lb Weight Gained Since Last Visit: 0 lb Starting Weight: 179 lb Total Weight Loss (lbs): 12 lb (5.443 kg) Peak Weight: 203 lb   Body Composition  Body Fat %: 40.3 % Fat Mass (lbs): 67.4 lbs Muscle Mass (lbs): 95 lbs Total Body Water (lbs): 70 lbs Visceral Fat Rating : 10    No data recorded Today's Visit #: 5  Starting Date: 03/14/23   Subjective   Chief Complaint: Obesity  Interval History Discussed the use of AI scribe software for clinical note transcription with the patient, who gave verbal consent to proceed.  History of Present Illness   Emily Rice is a 63 year old female who presents for medical weight management.  She has lost seven pounds since her last visit. She follows a pescatarian reduced-calorie nutrition plan 70-80% of the time, tracks her calories, incorporates more whole foods, gets the recommended amount of protein, and maintains adequate hydration. She occasionally skips meals and practices intermittent fasting by not eating until 10 or 11 AM. She exercises five days a week for 45-60 minutes, combining strength training, cardio, and yoga. She reports adequate sleep.  She has a history of hypertension, abnormal food appetite, and bipolar disorder, for which she is on weight-promoting medications. She is currently taking metformin  for pharmacoprophylaxis, with a dose of two tablets twice a day. The medication has helped reduce her appetite, allowing her to delay eating until later in the morning.  She recently experienced a bladder infection, which was unexpected as she had not had one since her early twenties. A routine urine sample  revealed a urinary tract infection with moderate blood and elevated leukocytes. She was initially treated with a five-day course of nitrofurantoin , which did not resolve the infection. She later developed symptoms of bladder pain, urgency, and frequency, leading to a second antibiotic course with cefdinir for seven days, which improved her condition.  She experiences mild pain in her right leg, where she previously had a deep vein thrombosis, but notes it is not as severe as before. She prefers outdoor walking over using a treadmill and engages in spinning and stretching exercises.       Challenges affecting patient progress: medical comorbidities and presence of obesogenic drugs.    Pharmacotherapy for weight management: She is currently taking Metformin  (off label use for incretin effect and / or insulin  resistance and / or diabetes prevention) with adequate clinical response  and without side effects..   Assessment and Plan   Treatment Plan For Obesity:  Recommended Dietary Goals  [name] is currently in the action stage of change. As such, her goal is to continue weight management plan. She has agreed to: continue current plan  Behavioral Health and Counseling  We discussed the following behavioral modification strategies today: increasing lean protein intake to established goals, decreasing simple carbohydrates , increasing vegetables, avoiding skipping meals, increasing water intake , and work on meal planning and preparation.  Additional education and resources provided today: None  Recommended Physical Activity Goals  [name] has been advised to work up to 150 minutes of moderate intensity aerobic activity a week and strengthening exercises 2-3 times per week for  cardiovascular health, weight loss maintenance and preservation of muscle mass.   She has agreed to :  continue to gradually increase the amount and intensity of exercise routine  Pharmacotherapy  We discussed various  medication options to help [name] with her weight loss efforts and we both agreed to : adequate clinical response to anti-obesity medication, continue current regimen  Associated Conditions Impacted by Obesity Treatment  Prediabetes Assessment & Plan: Most recent A1c is  Lab Results  Component Value Date   HGBA1C 5.8 02/28/2023   HGBA1C 5.8 (H) 07/31/2021    Patient aware of disease state and risk of progression. This may contribute to abnormal cravings, fatigue and diabetic complications without having diabetes.   Advised to maintain a diet low on simple and processed carbohydrates.  On metformin  XR 1000 mg twice a day without AE and good response. Continue medication.    Orders: -     metFORMIN  HCl ER; Take 2 tablets (1,000 mg total) by mouth 2 (two) times daily with a meal.  Dispense: 120 tablet; Refill: 1  Class 1 obesity with serious comorbidity and body mass index (BMI) of 31.0 to 31.9 in adult, unspecified obesity type Assessment & Plan: She has lost seven pounds since the last visit, following a pescatarian reduced-calorie nutrition plan 70-80% of the time. She tracks her calories, incorporates whole foods, ensures adequate protein intake, and maintains hydration. She exercises five days a week, combining strength, cardio, and yoga. Her body fat percentage decreased from 42% to 40%, with preserved muscle mass. Her BMI is now 29, down from a starting weight of 179 pounds in January. Weight loss is attributed to diet, exercise, and metformin , which helps prevent weight gain from her mental health medications, reduces visceral fat, and prevents diabetes. - Continue current diet and exercise regimen - Continue metformin  2 tablets twice a day - Refill metformin  prescription at [facility] - Encourage consistency in diet and exercise - Monitor weight weekly at home  Orders: -     metFORMIN  HCl ER; Take 2 tablets (1,000 mg total) by mouth 2 (two) times daily with a meal.  Dispense: 120  tablet; Refill: 1  Weight gain due to medication Assessment & Plan: Patient is on Seroquel  as she also takes several anticholinergic drugs which may contribute to weight gain.  Continue metformin  for pharmacoprophylaxis   Abnormal food appetite Assessment & Plan: Improved on nutritional strategies and metformin .  She has increased orexigenic signaling, impaired satiety and inhibitory control. This is secondary to an abnormal energy regulation system and pathological neurohormonal pathways characteristic of excess adiposity.  She also has a history of traumatic brain injury which may have affected her energy regulation system.  She benefits from ongoing pharmacotherapy             Objective   Physical Exam:  Blood pressure 127/74, pulse (!) 56, temperature 98.5 F (36.9 C), height 5\' 3"  (1.6 m), weight 167 lb (75.8 kg), SpO2 100%. Body mass index is 29.58 kg/m.  General: She is overweight, cooperative, alert, well developed, and in no acute distress. PSYCH: Has normal mood, affect and thought process.   HEENT: EOMI, sclerae are anicteric. Lungs: Normal breathing effort, no conversational dyspnea. Extremities: No edema.  Neurologic: No gross sensory or motor deficits. No tremors or fasciculations noted.    Diagnostic Data Reviewed:  BMET    Component Value Date/Time   NA 142 05/29/2023 1645   NA 142 07/24/2022 0856   K 4.4 05/29/2023 1645   CL  105 05/29/2023 1645   CO2 30 05/29/2023 1645   GLUCOSE 90 05/29/2023 1645   BUN 11 05/29/2023 1645   BUN 12 07/24/2022 0856   CREATININE 0.95 05/29/2023 1645   CALCIUM 9.3 05/29/2023 1645   GFRNONAA >60 12/06/2022 1742   GFRAA >60 12/10/2019 1600   Lab Results  Component Value Date   HGBA1C 5.8 02/28/2023   HGBA1C 5.8 (H) 07/31/2021   Lab Results  Component Value Date   INSULIN  8.7 03/14/2023   Lab Results  Component Value Date   TSH 0.56 05/29/2023   CBC    Component Value Date/Time   WBC 7.8 05/29/2023 1645    RBC 5.05 05/29/2023 1645   HGB 13.5 05/29/2023 1645   HGB 13.0 04/27/2022 0735   HCT 41.4 05/29/2023 1645   HCT 39.7 04/27/2022 0735   PLT 211.0 05/29/2023 1645   PLT 239 04/27/2022 0735   MCV 82.1 05/29/2023 1645   MCV 80 04/27/2022 0735   MCH 26.1 12/06/2022 1742   MCHC 32.6 05/29/2023 1645   RDW 15.3 05/29/2023 1645   RDW 16.0 (H) 04/27/2022 0735   Iron Studies No results found for: "IRON", "TIBC", "FERRITIN", "IRONPCTSAT" Lipid Panel     Component Value Date/Time   CHOL 165 02/28/2023 1028   CHOL 193 02/27/2021 1013   TRIG 116.0 02/28/2023 1028   HDL 56.10 02/28/2023 1028   HDL 60 02/27/2021 1013   CHOLHDL 3 02/28/2023 1028   VLDL 23.2 02/28/2023 1028   LDLCALC 86 02/28/2023 1028   LDLCALC 115 (H) 02/27/2021 1013   LDLDIRECT 111 (H) 02/27/2021 1013   Hepatic Function Panel     Component Value Date/Time   PROT 7.8 05/29/2023 1645   PROT 7.3 07/24/2022 0856   ALBUMIN  4.7 05/29/2023 1645   ALBUMIN  4.7 07/24/2022 0856   AST 19 05/29/2023 1645   ALT 4 05/29/2023 1645   ALKPHOS 67 05/29/2023 1645   BILITOT 0.3 05/29/2023 1645   BILITOT 0.3 07/24/2022 0856      Component Value Date/Time   TSH 0.56 05/29/2023 1645   Nutritional Lab Results  Component Value Date   VD25OH 59.4 03/14/2023   VD25OH 30.38 03/04/2020    Medications: Outpatient Encounter Medications as of 07/01/2023  Medication Sig   acetaminophen  (TYLENOL ) 325 MG tablet Take 2 tablets (650 mg total) by mouth every 6 (six) hours as needed for mild pain or moderate pain.   busPIRone  (BUSPAR ) 30 MG tablet TAKE 1 TABLET BY MOUTH 2 TIMES DAILY.   CAPLYTA  42 MG capsule TAKE 1 CAPSULE EVERY DAY   carbidopa -levodopa  (SINEMET  IR) 25-100 MG tablet Take one tablet in the am and one tablet in the pm   cetirizine (ZYRTEC) 10 MG tablet Take 10 mg by mouth daily as needed for allergies.   Cholecalciferol  75 MCG (3000 UT) TABS Take 3,000 Units by mouth daily.   diltiazem  (CARDIZEM  CD) 360 MG 24 hr capsule Take  1 capsule (360 mg total) by mouth every morning.   diphenhydrAMINE  (BENADRYL  ALLERGY EXTRA STR) 50 MG tablet Take 1 tablet (50 mg total) by mouth once for 1 dose. Take 1 tablet 1 hour prior to CT scan.   escitalopram  (LEXAPRO ) 10 MG tablet TAKE 1 TABLET AT BEDTIME   fexofenadine (ALLEGRA) 180 MG tablet Take 180 mg by mouth as needed for allergies or rhinitis.   fluticasone (FLONASE) 50 MCG/ACT nasal spray Place 1 spray into both nostrils daily as needed for allergies.    lamoTRIgine  (LAMICTAL ) 200 MG tablet TAKE  1 TABLET TWICE DAILY   LORazepam  (ATIVAN ) 0.5 MG tablet Take 1 tablet (0.5 mg total) by mouth daily.   lumateperone  tosylate (CAPLYTA ) 42 MG capsule Take 1 capsule (42 mg total) by mouth daily.   Multiple Vitamin (MULTIVITAMIN WITH MINERALS) TABS tablet Take 1 tablet by mouth daily.   polyethylene glycol (MIRALAX  / GLYCOLAX ) 17 g packet Take 17 g by mouth 2 (two) times daily. (Patient taking differently: Take 17 g by mouth daily as needed for mild constipation.)   QUEtiapine  (SEROQUEL ) 50 MG tablet TAKE 1 TABLET AT BEDTIME   warfarin (COUMADIN ) 5 MG tablet Take 5 mg by mouth daily.   [DISCONTINUED] metFORMIN  (GLUCOPHAGE -XR) 500 MG 24 hr tablet Take 2 tablets (1,000 mg total) by mouth 2 (two) times daily with a meal.   metFORMIN  (GLUCOPHAGE -XR) 500 MG 24 hr tablet Take 2 tablets (1,000 mg total) by mouth 2 (two) times daily with a meal.   No facility-administered encounter medications on file as of 07/01/2023.     Follow-Up   Return in about 5 weeks (around 08/05/2023) for For Weight Mangement with Dr. Allie Area.Aaron Aas She was informed of the importance of frequent follow up visits to maximize her success with intensive lifestyle modifications for her multiple health conditions.  Attestation Statement   Reviewed by clinician on day of visit: allergies, medications, problem list, medical history, surgical history, family history, social history, and previous encounter notes.     Ladd Picker, MD

## 2023-07-01 NOTE — Assessment & Plan Note (Signed)
 Prolactin ordered as requested.

## 2023-07-01 NOTE — Assessment & Plan Note (Signed)
 Most recent A1c is  Lab Results  Component Value Date   HGBA1C 5.8 02/28/2023   HGBA1C 5.8 (H) 07/31/2021    Patient aware of disease state and risk of progression. This may contribute to abnormal cravings, fatigue and diabetic complications without having diabetes.   Advised to maintain a diet low on simple and processed carbohydrates.  On metformin  XR 1000 mg twice a day without AE and good response. Continue medication.

## 2023-07-01 NOTE — Assessment & Plan Note (Signed)
 Continue healthy diet. Follow up with [facility]

## 2023-07-02 ENCOUNTER — Encounter: Payer: Self-pay | Admitting: Family Medicine

## 2023-07-02 ENCOUNTER — Ambulatory Visit (INDEPENDENT_AMBULATORY_CARE_PROVIDER_SITE_OTHER)

## 2023-07-02 DIAGNOSIS — Z7901 Long term (current) use of anticoagulants: Secondary | ICD-10-CM

## 2023-07-02 DIAGNOSIS — K869 Disease of pancreas, unspecified: Secondary | ICD-10-CM | POA: Insufficient documentation

## 2023-07-02 HISTORY — DX: Disease of pancreas, unspecified: K86.9

## 2023-07-02 LAB — POCT INR: INR: 1.6 — AB (ref 2.0–3.0)

## 2023-07-02 NOTE — Patient Instructions (Addendum)
 Pre visit review using our clinic review tool, if applicable. No additional management support is needed unless otherwise documented below in the visit note.  Increase dose today and tomorrow to take 1 1/2 tablets and then continue 1 tablet daily except take 1 1/2 tablets on Sunday. Recheck in 2 weeks.

## 2023-07-02 NOTE — Progress Notes (Signed)
 Pt has been eating more vitamin K containing foods.  Increase dose today and tomorrow to take 1 1/2 tablets and then continue 1 tablet daily except take 1 1/2 tablets on Sunday. Recheck in 2 weeks.

## 2023-07-03 ENCOUNTER — Telehealth: Payer: Self-pay

## 2023-07-03 NOTE — Telephone Encounter (Signed)
 Spoke with the patient and scheduled her for an appointment with Dr. Marven Slimmer

## 2023-07-03 NOTE — Addendum Note (Signed)
 Addended by: Karla Vines E on: 07/03/2023 01:07 PM   Modules accepted: Orders

## 2023-07-03 NOTE — Telephone Encounter (Signed)
-----   Message from Leanora Prophet [facility] sent at 07/02/2023  3:47 PM EDT ----- OK to schedule w me in clinic to discuss. CL ----- Message ----- From: Thomasena Fleming, NP Sent: 06/28/2023   4:31 PM EDT To: Boyce Byes, MD  Dr. Marven Slimmer,   I saw this lady in clinic again today.  She left mid visit last appt.  She is interested in pursuing ablation.  You saw her in the clinic and discussed with her but there were concerns for possible dental infection. She says she was cleared by her periodontist.  I told her that I would ask if you want to see her again vs seeing her the day of the procedure (I suspect you may want to see her again before though).   Cody Das

## 2023-07-03 NOTE — Telephone Encounter (Signed)
 Left message for patient to call back

## 2023-07-03 NOTE — Telephone Encounter (Signed)
 Pt was returning nurse call and is requesting a callback. Please advise.

## 2023-07-04 ENCOUNTER — Ambulatory Visit: Admitting: Family Medicine

## 2023-07-04 ENCOUNTER — Encounter: Payer: Self-pay | Admitting: Radiology

## 2023-07-04 NOTE — Telephone Encounter (Signed)
 Pt requesting a c/b from [place]. Please advise

## 2023-07-04 NOTE — Telephone Encounter (Signed)
 Patient calling to cancel her appointment with Dr. Albert Huff.

## 2023-07-08 ENCOUNTER — Telehealth: Payer: Self-pay | Admitting: Cardiology

## 2023-07-08 ENCOUNTER — Encounter: Payer: Self-pay | Admitting: Adult Health

## 2023-07-08 ENCOUNTER — Telehealth (INDEPENDENT_AMBULATORY_CARE_PROVIDER_SITE_OTHER): Admitting: Adult Health

## 2023-07-08 DIAGNOSIS — F09 Unspecified mental disorder due to known physiological condition: Secondary | ICD-10-CM | POA: Diagnosis not present

## 2023-07-08 DIAGNOSIS — G4700 Insomnia, unspecified: Secondary | ICD-10-CM | POA: Diagnosis not present

## 2023-07-08 DIAGNOSIS — F4310 Post-traumatic stress disorder, unspecified: Secondary | ICD-10-CM

## 2023-07-08 DIAGNOSIS — F3189 Other bipolar disorder: Secondary | ICD-10-CM | POA: Diagnosis not present

## 2023-07-08 DIAGNOSIS — F411 Generalized anxiety disorder: Secondary | ICD-10-CM | POA: Diagnosis not present

## 2023-07-08 DIAGNOSIS — F310 Bipolar disorder, current episode hypomanic: Secondary | ICD-10-CM

## 2023-07-08 MED ORDER — BUSPIRONE HCL 30 MG PO TABS: 30.0000 mg | ORAL_TABLET | Freq: Two times a day (BID) | ORAL | 1 refills | Status: DC

## 2023-07-08 NOTE — Telephone Encounter (Signed)
 Patient is requesting to speak with RN Bobby Burr.

## 2023-07-08 NOTE — Telephone Encounter (Signed)
 Spoke with the patient and reschedule her appointment with Dr. Marven Slimmer.

## 2023-07-08 NOTE — Telephone Encounter (Signed)
 Spoke with patient and she would like to reschedule her May 16 appointment and she would like to discuss it with [facility]
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
## 2023-07-09 ENCOUNTER — Telehealth: Payer: Self-pay | Admitting: Adult Health

## 2023-07-09 ENCOUNTER — Ambulatory Visit: Admitting: Cardiology

## 2023-07-09 ENCOUNTER — Other Ambulatory Visit: Payer: Self-pay

## 2023-07-09 DIAGNOSIS — F411 Generalized anxiety disorder: Secondary | ICD-10-CM

## 2023-07-09 MED ORDER — LORAZEPAM 0.5 MG PO TABS: 0.5000 mg | ORAL_TABLET | Freq: Two times a day (BID) | ORAL | 0 refills | Status: DC

## 2023-07-09 NOTE — Telephone Encounter (Signed)
 I had received a fax from [facility] for lorazepam . Had been filled at local pharmacy. She wants RF sent to [facility] due to lower cost. Will pend RF.

## 2023-07-09 NOTE — Telephone Encounter (Signed)
 Pt called asking for a refill of her lorazapam to be sent to [facility]. It is cheaper there than at the [facility].

## 2023-07-09 NOTE — Telephone Encounter (Signed)
 Pended one RF of lorazepam  to [facility].

## 2023-07-11 ENCOUNTER — Other Ambulatory Visit: Payer: Self-pay | Admitting: Physician Assistant

## 2023-07-11 ENCOUNTER — Encounter: Payer: Self-pay | Admitting: Physician Assistant

## 2023-07-11 ENCOUNTER — Ambulatory Visit: Admitting: Physician Assistant

## 2023-07-11 VITALS — BP 122/68 | HR 60 | Ht 63.0 in | Wt 171.0 lb

## 2023-07-11 DIAGNOSIS — R933 Abnormal findings on diagnostic imaging of other parts of digestive tract: Secondary | ICD-10-CM

## 2023-07-11 DIAGNOSIS — R935 Abnormal findings on diagnostic imaging of other abdominal regions, including retroperitoneum: Secondary | ICD-10-CM

## 2023-07-11 DIAGNOSIS — K869 Disease of pancreas, unspecified: Secondary | ICD-10-CM

## 2023-07-11 NOTE — Progress Notes (Signed)
 Chief Complaint: Enlarged pancreas  HPI:    Emily Rice is a 63 year old female with a past medical history as listed below including DVT, Afib on Coumadin , who was referred to me by Abram Abraham, NP-C for a complaint of enlarged pancreas .      12/07/2022 patient had a CT angio of the chest which showed fullness in the pancreatic head region which appeared similar to the prior exam (compared to CT 12/26/2021)    05/29/2023 CMP normal, CBC normal.    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  Tells me that her labs have been normal including a lipase (which I cannot see in the computer), she does have occasional abdominal pain in her upper abdomen, in fact this was severe a few weeks ago with some nausea but it went away after a couple of days.  This is not typical for her.  Denies any other alarm features including weight loss or family history of pancreatic cancer.  No history of diabetes.    Denies fever, chills, weight loss, abdominal pain or change in bowel habits.  Past Medical History:  Diagnosis Date   A-fib [facility])    Allergy    Anxiety    Bipolar disorder (HCC)    Chondromalacia patellae 12/14/2003   Depression    DVT (deep vein thrombosis) in pregnancy    DVT (deep venous thrombosis) (HCC) 2001 and 2002   DVT (deep venous thrombosis) (HCC)    x 2   Enlarged pancreas 07/02/2023   Fulness of pancreatic head on CT    Esophagitis 06/06/2016   GERD (gastroesophageal reflux disease)    Hemorrhage    Hyperlipidemia    Migraines    Pulmonary embolism (HCC) 2001   S/P cholecystectomy 09/11/2012   Sickle cell anemia (HCC)    Suicidal ideation 12/28/2008   Tremor     Past Surgical History:  Procedure Laterality Date   CHOLECYSTECTOMY  2014   CHOLECYSTECTOMY     HUMERUS  IM NAIL Right 03/03/2020   Procedure: INTRAMEDULLARY (IM) NAIL HUMERAL;  Surgeon: Laneta Pintos, MD;  Location: [facility] OR;  Service: Orthopedics;  Laterality: Right;   OVARIAN CYST REMOVAL     TONSILLECTOMY      Current Outpatient Medications  Medication Sig Dispense Refill   acetaminophen  (TYLENOL ) 325 MG tablet Take 2 tablets (650 mg total) by mouth every 6 (six) hours as needed for mild pain or moderate pain.     busPIRone  (BUSPAR ) 30 MG tablet Take 1 tablet (30 mg total) by mouth 2 (two) times daily. 60 tablet 1   CAPLYTA  42 MG capsule TAKE 1 CAPSULE EVERY DAY 30 capsule 11   carbidopa -levodopa  (SINEMET  IR) 25-100 MG tablet Take one tablet in the am and one tablet in the pm (Patient taking differently: Take 1 tablet by mouth 3 (three) times daily. Take one tablet in the am and one tablet in the pm) 60 tablet 4   cetirizine (ZYRTEC) 10 MG tablet Take 10 mg by mouth daily as needed for allergies.     Cholecalciferol  75 MCG (3000 UT) TABS Take 3,000 Units by mouth daily. 30 tablet 0   diltiazem  (CARDIZEM  CD) 360 MG 24 hr capsule Take 1 capsule (360 mg total) by mouth every  morning. 30 capsule 5   diphenhydrAMINE  (BENADRYL  ALLERGY EXTRA STR) 50 MG tablet Take 1 tablet (50 mg total) by mouth once for 1 dose. Take 1 tablet 1 hour prior to CT scan. 1 tablet 0   escitalopram  (LEXAPRO ) 10 MG tablet TAKE 1 TABLET AT BEDTIME 90 tablet 3   fexofenadine (ALLEGRA) 180 MG tablet Take 180 mg by mouth as needed for allergies or rhinitis.     fluticasone (FLONASE) 50 MCG/ACT nasal spray Place 1 spray into both nostrils daily as needed for allergies.      lamoTRIgine  (LAMICTAL ) 200 MG tablet TAKE 1 TABLET TWICE DAILY 180 tablet 0   LORazepam  (ATIVAN ) 0.5 MG tablet Take 1 tablet (0.5 mg total) by mouth 2 (two) times daily. 60 tablet 0   lumateperone  tosylate (CAPLYTA ) 42 MG capsule Take 1 capsule (42 mg total) by mouth daily. 30 capsule 0   metFORMIN  (GLUCOPHAGE -XR) 500 MG 24 hr tablet Take 2 tablets (1,000 mg  total) by mouth 2 (two) times daily with a meal. 120 tablet 1   Multiple Vitamin (MULTIVITAMIN WITH MINERALS) TABS tablet Take 1 tablet by mouth daily.     polyethylene glycol (MIRALAX  / GLYCOLAX ) 17 g packet Take 17 g by mouth 2 (two) times daily. (Patient taking differently: Take 17 g by mouth daily as needed for mild constipation.) 14 each 0   QUEtiapine  (SEROQUEL ) 50 MG tablet TAKE 1 TABLET AT BEDTIME 90 tablet 1   warfarin (COUMADIN ) 5 MG tablet Take 5 mg by mouth daily.     No current facility-administered medications for this visit.    Allergies as of 07/11/2023 - Review Complete 07/11/2023  Allergen Reaction Noted   Hydromorphone  Other (See Comments) 05/27/2007   Iodinated contrast media Other (See Comments) 05/27/2007   Prochlorperazine Swelling and Other (See Comments) 12/04/2001    Family History  Problem Relation Age of Onset   Hypertension Mother    Stroke Father    Hypertension Father    Atrial fibrillation Father    Heart disease Father    Diabetes Sister    CAD Brother    Anxiety disorder Daughter    ADD / ADHD Son    Depression Son    Hypertension Maternal Uncle    Liver disease Neg Hx    Esophageal cancer Neg Hx    Colon cancer Neg Hx     Social History   Socioeconomic History   Marital status: Married    Spouse name: Marine scientist   Number of children: 2   Years of education: college   Highest education level: Master's degree (e.g., MA, MS, MEng, MEd, MSW, MBA)  Occupational History   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   Right-handed.   No daily use of caffeine.   Lives with  husband.-2025      Social Drivers of Corporate investment banker Strain: Low Risk  (03/29/2023)   Overall Financial Resource Strain (CARDIA)    Difficulty of Paying Living Expenses: Not hard at all  Food Insecurity: No Food Insecurity (03/29/2023)   Hunger Vital Sign    Worried About Running Out of Food in the Last Year: Never true    Ran Out of Food in the Last Year: Never true  Transportation Needs: No Transportation Needs (03/29/2023)   PRAPARE - Administrator, Civil Service (Medical): No    Lack of Transportation (Non-Medical): No  Recent Concern: Transportation Needs - Unmet Transportation Needs (02/28/2023)   PRAPARE - Transportation    Lack of Transportation (Medical): Yes    Lack of Transportation (Non-Medical): Patient declined  Physical Activity: Sufficiently Active (03/29/2023)   Exercise Vital Sign    Days of Exercise per Week: 5 days    Minutes of Exercise per Session: 60 min  Stress: Stress Concern Present (03/29/2023)   [employer] of Occupational Health - Occupational Stress Questionnaire    Feeling of Stress : To some extent  Social Connections: Moderately Isolated (03/29/2023)   Social Connection and Isolation Panel [NHANES]    Frequency of Communication with Friends and Family: More than three times a week    Frequency of Social Gatherings with Friends and Family: More than three times a week    Attends Religious Services: Never    Database administrator or Organizations: No    Attends Banker Meetings: Never    Marital Status: Married  Catering manager Violence: Not At Risk (03/29/2023)   Humiliation, Afraid, Rape, and Kick questionnaire    Fear of Current or Ex-Partner: No    Emotionally Abused: No     Physically Abused: No    Sexually Abused: No    Review of Systems:    Constitutional: No weight loss, fever or chills Skin: No rash  Cardiovascular: No chest pain  Respiratory: No SOB  Gastrointestinal: See HPI and otherwise negative Genitourinary: No dysuria  Neurological: No headache, dizziness or syncope Musculoskeletal: No new muscle or joint pain Hematologic: No bleeding Psychiatric: No history of depression or anxiety   Physical Exam:  Vital signs: BP 122/68   Pulse 60   Ht 5\' 3"  (1.6 m)   Wt 171 lb (77.6 kg)   LMP  (LMP Unknown)   BMI 30.29 kg/m    Constitutional:   Pleasant AA female appears to be in NAD, Well developed, Well nourished, alert and cooperative Head:  Normocephalic and atraumatic. Eyes:   PEERL, EOMI. No icterus. Conjunctiva pink. Ears:  Normal auditory acuity. Neck:  Supple Throat: Oral cavity and pharynx without inflammation, swelling or lesion.  Respiratory: Respirations even and unlabored. Lungs clear to auscultation bilaterally.   No wheezes, crackles, or rhonchi.  Cardiovascular: Normal S1, S2. No MRG. Irregularly irregular No peripheral edema, cyanosis or pallor.  Gastrointestinal:  Soft, nondistended, nontender. No rebound or guarding. Normal bowel sounds. No appreciable masses or hepatomegaly. Rectal:  Not performed.  Msk:  Symmetrical without gross deformities. Without edema, no deformity or joint abnormality.  Neurologic:  Alert and  oriented x4;  grossly normal neurologically.  Skin:   Dry and intact without significant lesions or rashes. Psychiatric: Demonstrates good judgement and reason without abnormal affect or behaviors.  RELEVANT LABS AND IMAGING: CBC    Component Value Date/Time   WBC 7.8 05/29/2023 1645   RBC 5.05 05/29/2023 1645  HGB 13.5 05/29/2023 1645   HGB 13.0 04/27/2022 0735   HCT 41.4 05/29/2023 1645   HCT 39.7 04/27/2022 0735   PLT 211.0 05/29/2023 1645   PLT 239 04/27/2022 0735   MCV 82.1 05/29/2023 1645   MCV  80 04/27/2022 0735   MCH 26.1 12/06/2022 1742   MCHC 32.6 05/29/2023 1645   RDW 15.3 05/29/2023 1645   RDW 16.0 (H) 04/27/2022 0735   LYMPHSABS 2.6 05/29/2023 1645   MONOABS 0.4 05/29/2023 1645   EOSABS 0.1 05/29/2023 1645   BASOSABS 0.1 05/29/2023 1645    CMP     Component Value Date/Time   NA 142 05/29/2023 1645   NA 142 07/24/2022 0856   K 4.4 05/29/2023 1645   CL 105 05/29/2023 1645   CO2 30 05/29/2023 1645   GLUCOSE 90 05/29/2023 1645   BUN 11 05/29/2023 1645   BUN 12 07/24/2022 0856   CREATININE 0.95 05/29/2023 1645   CALCIUM 9.3 05/29/2023 1645   PROT 7.8 05/29/2023 1645   PROT 7.3 07/24/2022 0856   ALBUMIN  4.7 05/29/2023 1645   ALBUMIN  4.7 07/24/2022 0856   AST 19 05/29/2023 1645   ALT 4 05/29/2023 1645   ALKPHOS 67 05/29/2023 1645   BILITOT 0.3 05/29/2023 1645   BILITOT 0.3 07/24/2022 0856   GFRNONAA >60 12/06/2022 1742   GFRAA >60 12/10/2019 1600    Assessment: 1.  Abnormal imaging of the pancreas: On a CTA of the chest in September 2024, apparently this had been unchanged over the past year, I cannot see any other previous imaging, no alarm features, normal labs 2.  Enlarged pancreas  Plan: 1.  Ordered MRI of the pancreas with pancreatic protocol for further evaluation. 2.  Patient to follow in clinic per recommendations after imaging.  Patient assigned to Dr. Brice Campi today.  Emily Capra, PA-C [facility] 07/11/2023, 1:31 PM  Cc: Henson, Vickie L, NP-C

## 2023-07-11 NOTE — Patient Instructions (Signed)
 You have been scheduled for an MRI at [facility] on Thursday 07/18/23. Your appointment time is 10:30 am. Please arrive to admitting (at main entrance of the hospital) 30 minutes prior to your appointment time for registration purposes. Please make certain not to have anything to eat or drink 6 hours prior to your test. In addition, if you have any metal in your body, have a pacemaker or defibrillator, please be sure to let your ordering physician know. This test typically takes 45 minutes to 1 hour to complete. Should you need to reschedule, please call [phone] to do so.

## 2023-07-12 NOTE — Progress Notes (Signed)
 Attending Physician's Attestation   I have reviewed the chart.   I agree with the Advanced Practitioner's note, impression, and recommendations with any updates as below.    Corliss Parish, MD [facility] Advanced Endoscopy Office # [phone]

## 2023-07-16 ENCOUNTER — Ambulatory Visit (INDEPENDENT_AMBULATORY_CARE_PROVIDER_SITE_OTHER): Admitting: Internal Medicine

## 2023-07-16 NOTE — Progress Notes (Deleted)
  Electrophysiology Office Follow up Visit Note:    Date:  07/16/2023   ID:  Emily Rice, DOB 09-Dec-1960, MRN [id number]  PCP:  Abram Abraham, NP-C  [facility] Cardiologist:  Olinda Bertrand, DO  [facility] Electrophysiologist:  Boyce Byes, MD    Interval History:     Emily Rice is a 63 y.o. female who presents for a follow up visit.   I last saw the patient September 20, 2022 for atrial fibrillation.  She has a history of atrial fibrillation, traumatic subarachnoid hemorrhage, bipolar disorder, sickle cell trait.  She was previously scheduled for catheter ablation in August 2024.  We delayed her ablation because of concern of an infection in her mouth.  She was last seen in clinic by [facility] on June 28, 2023.  At that appointment she reported episodes of atrial fibrillation 2 times per week.  She uses a smart watch to monitor her rhythm.  She is short of breath when in A-fib.  At the appointment with Cody Das she reported a desire to proceed with catheter ablation.  She was on Coumadin  for stroke prophylaxis.      Past medical, surgical, social and family history were reviewed.  ROS:   Please see the history of present illness.    All other systems reviewed and are negative.  EKGs/Labs/Other Studies Reviewed:    The following studies were reviewed today:  June 28, 2023 EKG shows sinus rhythm        Physical Exam:    VS:  LMP  (LMP Unknown)     Wt Readings from Last 3 Encounters:  07/11/23 171 lb (77.6 kg)  07/01/23 167 lb (75.8 kg)  06/28/23 171 lb (77.6 kg)     GEN: no distress CARD: RRR, No MRG RESP: No IWOB. CTAB.      ASSESSMENT:    1. Paroxysmal atrial fibrillation (HCC)   2. History of pulmonary embolism    PLAN:    In order of problems listed above:  #Paroxysmal atrial fibrillation Symptomatic.  I discussed treatment options for atrial fibrillation during today's clinic appointment including antiarrhythmic drugs and catheter  ablation.  I discussed the catheter ablation procedure in detail including the risks, recovery and likelihood of success.  She is interested in proceeding with scheduling.  She will continue Coumadin  for stroke prophylaxis.  Discussed treatment options today for AF including antiarrhythmic drug therapy and ablation. Discussed risks, recovery and likelihood of success with each treatment strategy. Risk, benefits, and alternatives to EP study and ablation for afib were discussed. These risks include but are not limited to stroke, bleeding, vascular damage, tamponade, perforation, damage to the esophagus, lungs, phrenic nerve and other structures, pulmonary vein stenosis, worsening renal function, coronary vasospasm and death.  Discussed potential need for repeat ablation procedures and antiarrhythmic drugs after an initial ablation. The patient understands these risk and wishes to proceed.  We will therefore proceed with catheter ablation at the next available time.  Carto, ICE, anesthesia are requested for the procedure.  Will also obtain CT PV protocol prior to the procedure to exclude LAA thrombus and further evaluate atrial anatomy.  #History of pulmonary embolism Continue Coumadin    Signed, Harvie Liner, MD, [facility], [facility] 07/16/2023 8:44 PM    Electrophysiology [facility]

## 2023-07-17 ENCOUNTER — Ambulatory Visit: Admitting: Cardiology

## 2023-07-17 ENCOUNTER — Telehealth: Payer: Self-pay | Admitting: Cardiology

## 2023-07-17 DIAGNOSIS — Z86711 Personal history of pulmonary embolism: Secondary | ICD-10-CM

## 2023-07-17 DIAGNOSIS — I480 Paroxysmal atrial fibrillation: Secondary | ICD-10-CM

## 2023-07-17 NOTE — Telephone Encounter (Signed)
 Patient identification verified by 2 forms. Shade Flood, RN     Tried calling patient. No answer. LVMTCB

## 2023-07-17 NOTE — Telephone Encounter (Signed)
 Pt requesting a cb to discuss appt she had to cx for today. Wanting to speak w/ [name]

## 2023-07-18 ENCOUNTER — Ambulatory Visit (HOSPITAL_COMMUNITY)

## 2023-07-18 ENCOUNTER — Encounter (HOSPITAL_COMMUNITY): Payer: Self-pay

## 2023-07-18 NOTE — Telephone Encounter (Signed)
 Spoke with pt. Pt understands Luther Saltness is not in the office today but would like to speak to her directly once back in the office. Will forward to [name].

## 2023-07-18 NOTE — Telephone Encounter (Signed)
 Patient is requesting to speak with RN [name]. Please advise.

## 2023-07-19 ENCOUNTER — Ambulatory Visit: Admitting: Family Medicine

## 2023-07-19 ENCOUNTER — Ambulatory Visit: Payer: Self-pay

## 2023-07-19 ENCOUNTER — Ambulatory Visit

## 2023-07-19 NOTE — Telephone Encounter (Signed)
 Contacted pt and rescheduled coumadin  clinic apt for 5/16 per pt request. She reports she dis not have transportation for this apt and she also has a migraine. Advised triage nurse advised she go to ER. She reported she thinks they miss understood and that she only needed to talk to the coumadin  clinic nurse. Reminded pt her INR was subtherapeutic at last apt and not supratherapeutic. Provided direct number to coumadin  clinic again. Pt verbalized understanding.

## 2023-07-19 NOTE — Telephone Encounter (Signed)
 Copied from CRM [id number]. Topic: Clinical - Red Word Triage >> Jul 19, 2023  7:48 AM Allyne Areola wrote: Red Word that prompted transfer to Nurse Triage: Patient was scheduled for an appointment but will be unable to make it because she is experiencing difficulties, she did not want to disclose but would like to speak with a nurse.   Chief Complaint: Headache Symptoms: pain, dizziness, nausea Frequency: 3 days Pertinent Negatives: Patient denies injuries, fever,  Disposition: [x] ED /[] Urgent Care (no appt availability in office) / [] Appointment(In office/virtual)/ []  [place] [facility]/ [] Home Care/ [] Refused Recommended Disposition /[] [place] [facility]/ []  Follow-up with PCP Additional Notes: Patient called and advised that she cancelled an appointment this morning with Cathleen Coach. Patient has been taking Tylenol  Migraine. This helped a little bit for a little while. Sharp pains would come back. Patient states that she had a subarachnoid hemorrhage. Patient states that she doesn't know if this has anything to do with a migraine or a hemorrhage. With the patient's medical history, she is advised that it is recommended  Patient states that she figured the Emergency Room would be the recommendation. Patient did not want to  Patient states her INR was elevated a few weeks ago and she wants to make an office an appointment with her PCP to have that rechecked. Patient is advised multiple times that it is recommended that she goes to the Emergency Room. Patient states that she doesn't want to go to the Emergency Room.  This RN advised that  could call an ambulance to come get her and take her to the Emergency Room. Patient spoke about her anxiety being an issue and she feels like an ambulance coming would cause a lot more anxiety. This RN explains to the patient the decision for the Emergency Room at this time. Patient states that she will call an ambulance. The patient repeated that she understood  what I was saying multiple times. This RN advised her that her safety was priority at this time. She decided that she would call an ambulance and she didn't want this RN to call one after I offered multiple times. Patient states she will call, this RN ended the call and is calling [facility] to advise them of concerns for this patient.    Reason for Disposition  Unable to walk, or can only walk with assistance (e.g., requires support)  Answer Assessment - Initial Assessment Questions 1. LOCATION: "Where does it hurt?"      Right side of head then seems like it goes all over head 2. ONSET: "When did the headache start?" (Minutes, hours or days)      3 days ago 3. PATTERN: "Does the pain come and go, or has it been constant since it started?"     Pretty constant and came on suddenly 4. SEVERITY: "How bad is the pain?" and "What does it keep you from doing?"  (e.g., Scale 1-10; mild, moderate, or severe)   - MILD (1-3): doesn't interfere with normal activities    - MODERATE (4-7): interferes with normal activities or awakens from sleep    - SEVERE (8-10): excruciating pain, unable to do any normal activities        8 5. RECURRENT SYMPTOM: "Have you ever had headaches before?" If Yes, ask: "When was the last time?" and "What happened that time?"      Patient states a couple of years ago  & she thought she was over having migraines until this week 6. CAUSE: "  What do you think is causing the headache?"     unknown 7. MIGRAINE: "Have you been diagnosed with migraine headaches?" If Yes, ask: "Is this headache similar?"      yes 8. HEAD INJURY: "Has there been any recent injury to the head?"      No 9. OTHER SYMPTOMS: "Do you have any other symptoms?" (fever, stiff neck, eye pain, sore throat, cold symptoms)     Nausea, dizziness  Protocols used: Headache-A-AH

## 2023-07-19 NOTE — Telephone Encounter (Signed)
 Called patient who states that she has been dealing with a bad migraine all week and that is why she was unable to make it to her appointment with Dr. Marven Slimmer. Appointment has been rescheduled.

## 2023-07-19 NOTE — Telephone Encounter (Signed)
 Copied from CRM [id number]. Topic: Appointments - Appointment Cancel/Reschedule >> Jul 19, 2023  7:39 AM Deaijah H wrote: Patient would like to speak with a nurse "Cathleen Coach" to explain the reason for her cancellation of her 07/19/23 8A appointment. Please call [phone]

## 2023-07-19 NOTE — Telephone Encounter (Signed)
 This RN called [facility] and advised them of patient's complaint/triage information/ and that this RN is unsure if patient is going to go to the Emergency Room at this time.

## 2023-07-22 ENCOUNTER — Inpatient Hospital Stay (HOSPITAL_COMMUNITY)
Admission: EM | Admit: 2023-07-22 | Discharge: 2023-07-26 | DRG: 309 | Disposition: A | Discharge status: Home / Self Care | Attending: Internal Medicine | Admitting: Internal Medicine

## 2023-07-22 ENCOUNTER — Other Ambulatory Visit: Payer: Self-pay

## 2023-07-22 ENCOUNTER — Emergency Department (HOSPITAL_COMMUNITY)

## 2023-07-22 DIAGNOSIS — R001 Bradycardia, unspecified: Secondary | ICD-10-CM | POA: Diagnosis present

## 2023-07-22 DIAGNOSIS — Z86718 Personal history of other venous thrombosis and embolism: Secondary | ICD-10-CM

## 2023-07-22 DIAGNOSIS — F3160 Bipolar disorder, current episode mixed, unspecified: Secondary | ICD-10-CM | POA: Diagnosis present

## 2023-07-22 DIAGNOSIS — I4891 Unspecified atrial fibrillation: Principal | ICD-10-CM | POA: Diagnosis present

## 2023-07-22 DIAGNOSIS — Z86711 Personal history of pulmonary embolism: Secondary | ICD-10-CM

## 2023-07-22 DIAGNOSIS — Z91041 Radiographic dye allergy status: Secondary | ICD-10-CM

## 2023-07-22 DIAGNOSIS — I480 Paroxysmal atrial fibrillation: Secondary | ICD-10-CM | POA: Diagnosis present

## 2023-07-22 DIAGNOSIS — R791 Abnormal coagulation profile: Secondary | ICD-10-CM | POA: Diagnosis present

## 2023-07-22 DIAGNOSIS — K219 Gastro-esophageal reflux disease without esophagitis: Secondary | ICD-10-CM | POA: Diagnosis present

## 2023-07-22 DIAGNOSIS — R519 Headache, unspecified: Secondary | ICD-10-CM | POA: Diagnosis not present

## 2023-07-22 DIAGNOSIS — F429 Obsessive-compulsive disorder, unspecified: Secondary | ICD-10-CM | POA: Diagnosis present

## 2023-07-22 DIAGNOSIS — Z8249 Family history of ischemic heart disease and other diseases of the circulatory system: Secondary | ICD-10-CM

## 2023-07-22 DIAGNOSIS — F411 Generalized anxiety disorder: Secondary | ICD-10-CM | POA: Diagnosis present

## 2023-07-22 DIAGNOSIS — R0600 Dyspnea, unspecified: Secondary | ICD-10-CM

## 2023-07-22 DIAGNOSIS — Z823 Family history of stroke: Secondary | ICD-10-CM

## 2023-07-22 DIAGNOSIS — G43709 Chronic migraine without aura, not intractable, without status migrainosus: Secondary | ICD-10-CM | POA: Diagnosis not present

## 2023-07-22 DIAGNOSIS — Z7901 Long term (current) use of anticoagulants: Secondary | ICD-10-CM | POA: Diagnosis not present

## 2023-07-22 DIAGNOSIS — Z885 Allergy status to narcotic agent status: Secondary | ICD-10-CM | POA: Diagnosis not present

## 2023-07-22 DIAGNOSIS — E785 Hyperlipidemia, unspecified: Secondary | ICD-10-CM | POA: Diagnosis present

## 2023-07-22 DIAGNOSIS — Z888 Allergy status to other drugs, medicaments and biological substances status: Secondary | ICD-10-CM | POA: Diagnosis not present

## 2023-07-22 DIAGNOSIS — R609 Edema, unspecified: Secondary | ICD-10-CM | POA: Diagnosis not present

## 2023-07-22 DIAGNOSIS — D571 Sickle-cell disease without crisis: Secondary | ICD-10-CM | POA: Diagnosis present

## 2023-07-22 DIAGNOSIS — G4700 Insomnia, unspecified: Secondary | ICD-10-CM | POA: Diagnosis present

## 2023-07-22 DIAGNOSIS — R000 Tachycardia, unspecified: Secondary | ICD-10-CM | POA: Diagnosis not present

## 2023-07-22 DIAGNOSIS — Z79899 Other long term (current) drug therapy: Secondary | ICD-10-CM | POA: Diagnosis not present

## 2023-07-22 DIAGNOSIS — Z833 Family history of diabetes mellitus: Secondary | ICD-10-CM | POA: Diagnosis not present

## 2023-07-22 DIAGNOSIS — G43701 Chronic migraine without aura, not intractable, with status migrainosus: Secondary | ICD-10-CM | POA: Diagnosis present

## 2023-07-22 DIAGNOSIS — I10 Essential (primary) hypertension: Secondary | ICD-10-CM | POA: Diagnosis present

## 2023-07-22 DIAGNOSIS — R0602 Shortness of breath: Secondary | ICD-10-CM | POA: Diagnosis not present

## 2023-07-22 DIAGNOSIS — I491 Atrial premature depolarization: Secondary | ICD-10-CM | POA: Diagnosis not present

## 2023-07-22 DIAGNOSIS — I471 Supraventricular tachycardia, unspecified: Secondary | ICD-10-CM | POA: Diagnosis present

## 2023-07-22 DIAGNOSIS — R079 Chest pain, unspecified: Secondary | ICD-10-CM | POA: Diagnosis not present

## 2023-07-22 DIAGNOSIS — F319 Bipolar disorder, unspecified: Secondary | ICD-10-CM | POA: Diagnosis present

## 2023-07-22 DIAGNOSIS — Z818 Family history of other mental and behavioral disorders: Secondary | ICD-10-CM

## 2023-07-22 DIAGNOSIS — R0789 Other chest pain: Secondary | ICD-10-CM | POA: Diagnosis not present

## 2023-07-22 DIAGNOSIS — Z5982 Transportation insecurity: Secondary | ICD-10-CM

## 2023-07-22 DIAGNOSIS — G43901 Migraine, unspecified, not intractable, with status migrainosus: Secondary | ICD-10-CM | POA: Diagnosis not present

## 2023-07-22 DIAGNOSIS — Z9151 Personal history of suicidal behavior: Secondary | ICD-10-CM

## 2023-07-22 LAB — BASIC METABOLIC PANEL WITH GFR
Anion gap: 13 (ref 5–15)
BUN: 14 mg/dL (ref 6–20)
CO2: 19 mmol/L — ABNORMAL LOW (ref 22–32)
Calcium: 9.2 mg/dL (ref 8.9–10.3)
Chloride: 105 mmol/L (ref 98–111)
Creatinine, Ser: 1.01 mg/dL — ABNORMAL HIGH (ref 0.44–1.00)
GFR, Estimated: >60 mL/min (ref >60)
Glucose, Bld: 105 mg/dL — ABNORMAL HIGH (ref 70–99)
Potassium: 3.7 mmol/L (ref 3.5–5.1)
Sodium: 137 mmol/L (ref 135–145)

## 2023-07-22 LAB — CBC
HCT: 39.5 % (ref 36.0–46.0)
Hemoglobin: 13.3 g/dL (ref 12.0–15.0)
MCH: 26.4 pg (ref 26.0–34.0)
MCHC: 33.7 g/dL (ref 30.0–36.0)
MCV: 78.4 fL — ABNORMAL LOW (ref 80.0–100.0)
Platelets: 213 10*3/uL (ref 150–400)
RBC: 5.04 MIL/uL (ref 3.87–5.11)
RDW: 15.5 % (ref 11.5–15.5)
WBC: 11.3 10*3/uL — ABNORMAL HIGH (ref 4.0–10.5)
nRBC: 0 % (ref 0.0–0.2)

## 2023-07-22 LAB — PROTIME-INR
INR: 1.5 — ABNORMAL HIGH (ref 0.8–1.2)
Prothrombin Time: 18.1 s — ABNORMAL HIGH (ref 11.4–15.2)

## 2023-07-22 LAB — TROPONIN I (HIGH SENSITIVITY): Troponin I (High Sensitivity): 6 ng/L (ref <18)

## 2023-07-22 LAB — CBG MONITORING, ED: Glucose-Capillary: 100 mg/dL — ABNORMAL HIGH (ref 70–99)

## 2023-07-22 MED ORDER — DILTIAZEM HCL-DEXTROSE 125-5 MG/125ML-% IV SOLN (PREMIX)
5.0000 mg/h | INTRAVENOUS | Status: DC
  Filled 2023-07-22: qty 125

## 2023-07-22 MED ORDER — MORPHINE SULFATE (PF) 4 MG/ML IV SOLN
4.0000 mg | Freq: Once | INTRAVENOUS | Status: AC
  Administered 2023-07-22: 4 mg via INTRAVENOUS
  Filled 2023-07-22: qty 1

## 2023-07-22 MED ORDER — ONDANSETRON HCL 4 MG/2ML IJ SOLN
4.0000 mg | INTRAMUSCULAR | Status: AC | PRN
  Administered 2023-07-22: 4 mg via INTRAVENOUS
  Filled 2023-07-22: qty 2

## 2023-07-22 NOTE — ED Triage Notes (Signed)
 Pt BIB EMS from home. Reports pt had sudden onset of CP/SOB. Hx of afib, was supposed to have ablation last wk but didn't d/t migraine, takes Cardizem  daily.HR 130-140s Afib, Aflutter with pvcs and NSR 70s.    EMS reports pt was initially verbally aggressive and husband stated pt is currently in manic violent episode, husband says pt has been compliant with Buspar  and Lamictal   EMS admin 324 aspirin, .4 nitroglycerin x2 and reports some relief of CP  EMS VS 140/80, 100% RA, cbg 102

## 2023-07-22 NOTE — ED Provider Notes (Signed)
 [place] EMERGENCY DEPARTMENT AT [facility] Provider Note   CSN: [id number] Arrival date & time: 07/22/23  2102     History {Add pertinent medical, surgical, social history, OB history to HPI:1} Chief Complaint  Patient presents with   Irregular Heart Beat    Emily Rice is a 63 y.o. female with past medical history of AF, HTN, TBI/SAH, PE, DVT (2002), sickle cell trait, bipolar, chronic AC (coumadin ) disorder presents emergency department via EMS for evaluation of chest pain and shortness of breath.  She reports that she was sitting when she felt a sudden chest pressure at 1700 that started it 9/10.  It radiated to her right shoulder.  Had no diaphoresis, nausea.  EMS reports heart rate in 130-140s.  Provided aspirin 324 mg, nitroglycerin 0.4 x2 with some relief to chest pain.  Denies hemoptysis, syncope, recent trauma, recent surgery, pedal edema  Is followed by Creighton Doffing NP at [facility].  Had follow-up appointment with Dr. Marven Slimmer to schedule appointment for consultation regarding ablation.  However, she missed this appointment as she had a migraine last week.  Has not scheduled subsequent appointment.  Has been compliant with medications, Coumadin .  Also, was initially verbally aggressive with husband as he told her that she is "making up her A-fib".  She has been compliant with her psychiatric medication.  No aggression while in ED.  HPI    Home Medications Prior to Admission medications   Medication Sig Start Date End Date Taking? Authorizing Provider  acetaminophen  (TYLENOL ) 325 MG tablet Take 2 tablets (650 mg total) by mouth every 6 (six) hours as needed for mild pain or moderate pain. 04/04/20   Angiulli, Everlyn Hockey, PA-C  busPIRone  (BUSPAR ) 30 MG tablet Take 1 tablet (30 mg total) by mouth 2 (two) times daily. 07/08/23   Mozingo, [name] Nattalie, NP  CAPLYTA  42 MG capsule TAKE 1 CAPSULE EVERY DAY 03/25/23   Mozingo, [name] Nattalie, NP   carbidopa -levodopa  (SINEMET  IR) 25-100 MG tablet Take one tablet in the am and one tablet in the pm Patient taking differently: Take 1 tablet by mouth 3 (three) times daily. Take one tablet in the am and one tablet in the pm 05/25/20   Swartz, Zachary T, MD  cetirizine (ZYRTEC) 10 MG tablet Take 10 mg by mouth daily as needed for allergies.    [provider]  Cholecalciferol  75 MCG (3000 UT) TABS Take 3,000 Units by mouth daily. 04/04/20   Angiulli, Everlyn Hockey, PA-C  diltiazem  (CARDIZEM  CD) 360 MG 24 hr capsule Take 1 capsule (360 mg total) by mouth every morning. 06/28/23   Thomasena Fleming, NP  diphenhydrAMINE  (BENADRYL  ALLERGY EXTRA STR) 50 MG tablet Take 1 tablet (50 mg total) by mouth once for 1 dose. Take 1 tablet 1 hour prior to CT scan. 03/30/22 10/13/23  Boyce Byes, MD  escitalopram  (LEXAPRO ) 10 MG tablet TAKE 1 TABLET AT BEDTIME 10/31/22   Mozingo, [name] Nattalie, NP  fexofenadine (ALLEGRA) 180 MG tablet Take 180 mg by mouth as needed for allergies or rhinitis.    [provider]  fluticasone (FLONASE) 50 MCG/ACT nasal spray Place 1 spray into both nostrils daily as needed for allergies.     [provider]  lamoTRIgine  (LAMICTAL ) 200 MG tablet TAKE 1 TABLET TWICE DAILY 05/12/23   Mozingo, [name] Nattalie, NP  LORazepam  (ATIVAN ) 0.5 MG tablet Take 1 tablet (0.5 mg total) by mouth 2 (two) times daily. 07/09/23   Mozingo, [name]  Nattalie, NP  lumateperone  tosylate (CAPLYTA ) 42 MG capsule Take 1 capsule (42 mg total) by mouth daily. 01/11/23   Mozingo, [name] Nattalie, NP  metFORMIN  (GLUCOPHAGE -XR) 500 MG 24 hr tablet Take 2 tablets (1,000 mg total) by mouth 2 (two) times daily with a meal. 07/01/23   Ladd Picker, MD  Multiple Vitamin (MULTIVITAMIN WITH MINERALS) TABS tablet Take 1 tablet by mouth daily. 04/05/20   Angiulli, Everlyn Hockey, PA-C  polyethylene glycol (MIRALAX  / GLYCOLAX ) 17 g packet Take 17 g by mouth 2 (two) times daily. Patient taking differently: Take  17 g by mouth daily as needed for mild constipation. 04/04/20   Angiulli, Everlyn Hockey, PA-C  QUEtiapine  (SEROQUEL ) 50 MG tablet TAKE 1 TABLET AT BEDTIME 04/11/23   Mozingo, [name] Nattalie, NP  warfarin (COUMADIN ) 5 MG tablet Take 5 mg by mouth daily. 12/15/21   [provider]      Allergies    Hydromorphone , Iodinated contrast media, and Prochlorperazine    Review of Systems   Review of Systems  Constitutional:  Negative for chills, fatigue and fever.  Respiratory:  Negative for cough, chest tightness, shortness of breath and wheezing.   Cardiovascular:  Negative for chest pain and palpitations.  Gastrointestinal:  Negative for abdominal pain, constipation, diarrhea, nausea and vomiting.  Neurological:  Negative for dizziness, seizures, weakness, light-headedness, numbness and headaches.    Physical Exam Updated Vital Signs BP (!) 158/96   Pulse 89   Temp 98.6 F (37 C) (Oral)   Resp 17   LMP  (LMP Unknown)   SpO2 100%  Physical Exam Vitals and nursing note reviewed.  Constitutional:      General: She is not in acute distress.    Appearance: Normal appearance.  HENT:     Head: Normocephalic and atraumatic.  Eyes:     Conjunctiva/sclera: Conjunctivae normal.  Cardiovascular:     Rate and Rhythm: Tachycardia present. Rhythm irregular.     Comments: Most of the time in NSR.  Has runs of atrial fibrillation ranging from 120-130 Pulmonary:     Effort: Pulmonary effort is normal. No respiratory distress.  Musculoskeletal:     Right lower leg: No edema.     Left lower leg: No edema.     Comments: No swelling or tenderness along venous system.  Celine Collard' sign negative x 2  Skin:    Coloration: Skin is not jaundiced or pale.  Neurological:     Mental Status: She is alert. Mental status is at baseline.     ED Results / Procedures / Treatments   Labs (all labs ordered are listed, but only abnormal results are displayed) Labs Reviewed  BASIC METABOLIC PANEL WITH GFR -  Abnormal; Notable for the following components:      Result Value   CO2 19 (*)    Glucose, Bld 105 (*)    Creatinine, Ser 1.01 (*)    All other components within normal limits  CBC - Abnormal; Notable for the following components:   WBC 11.3 (*)    MCV 78.4 (*)    All other components within normal limits  PROTIME-INR - Abnormal; Notable for the following components:   Prothrombin Time 18.1 (*)    INR 1.5 (*)    All other components within normal limits  CBG MONITORING, ED - Abnormal; Notable for the following components:   Glucose-Capillary 100 (*)    All other components within normal limits  TROPONIN I (HIGH SENSITIVITY)  TROPONIN I (HIGH SENSITIVITY)  EKG None  Radiology DG Chest Portable 1 View Result Date: 07/22/2023 CLINICAL DATA:  Chest pain and shortness of breath EXAM: PORTABLE CHEST 1 VIEW COMPARISON:  12/06/2022 FINDINGS: Cardiac shadow is within normal limits. Lungs are clear bilaterally. Postsurgical changes in the right humerus are seen. IMPRESSION: No active disease. Electronically Signed   By: Violeta Grey M.D.   On: 07/22/2023 22:03    Procedures .Critical Care  Performed by: Royann Cords, PA Authorized by: Royann Cords, PA   Critical care provider statement:    Critical care time (minutes):  33   Critical care time was exclusive of:  Separately billable procedures and treating other patients   Critical care was necessary to treat or prevent imminent or life-threatening deterioration of the following conditions:  Cardiac failure   Critical care was time spent personally by me on the following activities:  Blood draw for specimens, development of treatment plan with patient or surrogate, discussions with consultants, evaluation of patient's response to treatment, examination of patient, obtaining history from patient or surrogate, ordering and performing treatments and interventions, pulse oximetry and re-evaluation of patient's condition   {Document  cardiac monitor, telemetry assessment procedure when appropriate:1}  Medications Ordered in ED Medications  diltiazem  (CARDIZEM ) 125 mg in dextrose  5% 125 mL (1 mg/mL) infusion (has no administration in time range)  morphine  (PF) 4 MG/ML injection 4 mg (4 mg Intravenous Given 07/22/23 2236)  ondansetron  (ZOFRAN ) injection 4 mg (4 mg Intravenous Given 07/22/23 2222)    ED Course/ Medical Decision Making/ A&P   {   Click here for ABCD2, HEART and other calculatorsREFRESH Note before signing :1}                              Medical Decision Making Amount and/or Complexity of Data Reviewed Labs: ordered. Radiology: ordered.  Risk Prescription drug management.   Patient presents to the ED for concern of CP, SHOB, this involves an extensive number of treatment options, and is a complaint that carries with it a high risk of complications and morbidity.  The differential diagnosis includes ACS, atrial fibrillation uncontrolled, pneumonia, fluid overload   Co morbidities that complicate the patient evaluation  Atrial fibrillation Chronic anticoagulation with Coumadin    Additional history obtained:  Additional history obtained from Nursing and Outside Medical Records   External records from outside source obtained and reviewed including triage RN note, anticoagulation visit from 07/02/2023, most recent cardiology note from 06/28/2023   Lab Tests:  I Ordered, and personally interpreted labs.  The pertinent results include:   Troponin 6 INR 1.5 WBC 11.3 CBG 105   Imaging Studies ordered:  I ordered imaging studies including CXR I independently visualized and interpreted imaging which showed no acute cardiopulmonary pathology I agree with the radiologist interpretation   Cardiac Monitoring:  The patient was maintained on a cardiac monitor.  I personally viewed and interpreted the cardiac monitored which showed an underlying rhythm of: NSR with runs of atrial  fibrillation   Medicines ordered and prescription drug management:  I ordered medication including diltiazem  drip for atrial fibrillation I have reviewed the patients home medicines and have made adjustments as needed     Consultations Obtained:  I requested consultation with the cardiology Dr. Jacklyn Mash,  and discussed lab and imaging findings as well as pertinent plan - they recommend:  Dilt drip Hospitalist admission Will consult on her   Problem List / ED Course:  Atrial fibrillation Is in NSR with periods of A-fib RVR at rate of 120-130 Currently taking Coumadin .  2 weeks ago INR was 1.6 as patient reported that she was eating more vitamin K foods so increased her dose to 1-1/2 tablets then continue 1 tablet daily except take 1/2 tablets on Sunday with a recheck in 2 weeks (07/02/23 anticoag visit). Today 1.5 so not anticoagulated appropriately Cards consult above Will get hospitalist to admit   Reevaluation:  After the interventions noted above, I reevaluated the patient and found that they have :improved    Dispostion:  After consideration of the diagnostic results and the patients response to treatment, I feel that the patent would benefit from admission for obs and cards f/u.   Discussed ED workup, disposition with patient expresses understanding with the plan.  Final Clinical Impression(s) / ED Diagnoses Final diagnoses:  Atrial fibrillation, unspecified type (HCC)  Chest pain, unspecified type  Dyspnea, unspecified type    Rx / DC Orders ED Discharge Orders     None

## 2023-07-23 ENCOUNTER — Telehealth: Payer: Self-pay | Admitting: Adult Health

## 2023-07-23 ENCOUNTER — Observation Stay (HOSPITAL_BASED_OUTPATIENT_CLINIC_OR_DEPARTMENT_OTHER)

## 2023-07-23 ENCOUNTER — Observation Stay (HOSPITAL_COMMUNITY)

## 2023-07-23 ENCOUNTER — Encounter (HOSPITAL_COMMUNITY): Payer: Self-pay | Admitting: Internal Medicine

## 2023-07-23 DIAGNOSIS — F3161 Bipolar disorder, current episode mixed, mild: Secondary | ICD-10-CM

## 2023-07-23 DIAGNOSIS — R079 Chest pain, unspecified: Secondary | ICD-10-CM

## 2023-07-23 DIAGNOSIS — I4891 Unspecified atrial fibrillation: Secondary | ICD-10-CM | POA: Diagnosis present

## 2023-07-23 DIAGNOSIS — Z86718 Personal history of other venous thrombosis and embolism: Secondary | ICD-10-CM

## 2023-07-23 DIAGNOSIS — I10 Essential (primary) hypertension: Secondary | ICD-10-CM

## 2023-07-23 DIAGNOSIS — R609 Edema, unspecified: Secondary | ICD-10-CM | POA: Diagnosis not present

## 2023-07-23 DIAGNOSIS — R519 Headache, unspecified: Secondary | ICD-10-CM | POA: Diagnosis not present

## 2023-07-23 DIAGNOSIS — G43709 Chronic migraine without aura, not intractable, without status migrainosus: Secondary | ICD-10-CM | POA: Diagnosis not present

## 2023-07-23 LAB — TROPONIN I (HIGH SENSITIVITY): Troponin I (High Sensitivity): 5 ng/L (ref <18)

## 2023-07-23 LAB — BASIC METABOLIC PANEL WITH GFR
Anion gap: 10 (ref 5–15)
BUN: 13 mg/dL (ref 6–20)
CO2: 23 mmol/L (ref 22–32)
Calcium: 9.1 mg/dL (ref 8.9–10.3)
Chloride: 106 mmol/L (ref 98–111)
Creatinine, Ser: 0.89 mg/dL (ref 0.44–1.00)
GFR, Estimated: >60 mL/min (ref >60)
Glucose, Bld: 102 mg/dL — ABNORMAL HIGH (ref 70–99)
Potassium: 3.5 mmol/L (ref 3.5–5.1)
Sodium: 139 mmol/L (ref 135–145)

## 2023-07-23 LAB — HEPATIC FUNCTION PANEL
ALT: 17 U/L (ref 0–44)
AST: 23 U/L (ref 15–41)
Albumin: 3.9 g/dL (ref 3.5–5.0)
Alkaline Phosphatase: 56 U/L (ref 38–126)
Bilirubin, Direct: <0.1 mg/dL (ref 0.0–0.2)
Total Bilirubin: 0.5 mg/dL (ref 0.0–1.2)
Total Protein: 7.1 g/dL (ref 6.5–8.1)

## 2023-07-23 LAB — CBC WITH DIFFERENTIAL/PLATELET
Abs Immature Granulocytes: 0.02 10*3/uL (ref 0.00–0.07)
Basophils Absolute: 0 10*3/uL (ref 0.0–0.1)
Basophils Relative: 0 %
Eosinophils Absolute: 0.2 10*3/uL (ref 0.0–0.5)
Eosinophils Relative: 2 %
HCT: 39.1 % (ref 36.0–46.0)
Hemoglobin: 13.3 g/dL (ref 12.0–15.0)
Immature Granulocytes: 0 %
Lymphocytes Relative: 32 %
Lymphs Abs: 3.3 10*3/uL (ref 0.7–4.0)
MCH: 26.5 pg (ref 26.0–34.0)
MCHC: 34 g/dL (ref 30.0–36.0)
MCV: 77.9 fL — ABNORMAL LOW (ref 80.0–100.0)
Monocytes Absolute: 0.8 10*3/uL (ref 0.1–1.0)
Monocytes Relative: 7 %
Neutro Abs: 6 10*3/uL (ref 1.7–7.7)
Neutrophils Relative %: 59 %
Platelets: 204 10*3/uL (ref 150–400)
RBC: 5.02 MIL/uL (ref 3.87–5.11)
RDW: 15.4 % (ref 11.5–15.5)
WBC: 10.4 10*3/uL (ref 4.0–10.5)
nRBC: 0 % (ref 0.0–0.2)

## 2023-07-23 LAB — ECHOCARDIOGRAM COMPLETE
Area-P 1/2: 4.89 cm2
Height: 63 in
S' Lateral: 3.8 cm
Weight: 2733.7 [oz_av]

## 2023-07-23 LAB — MAGNESIUM: Magnesium: 2 mg/dL (ref 1.7–2.4)

## 2023-07-23 LAB — TSH: TSH: 3.945 u[IU]/mL (ref 0.350–4.500)

## 2023-07-23 LAB — HIV ANTIBODY (ROUTINE TESTING W REFLEX): HIV Screen 4th Generation wRfx: NONREACTIVE

## 2023-07-23 LAB — PROTIME-INR
INR: 1.6 — ABNORMAL HIGH (ref 0.8–1.2)
Prothrombin Time: 19 s — ABNORMAL HIGH (ref 11.4–15.2)

## 2023-07-23 LAB — D-DIMER, QUANTITATIVE: D-Dimer, Quant: 0.32 ug{FEU}/mL (ref 0.00–0.50)

## 2023-07-23 MED ORDER — METOCLOPRAMIDE HCL 5 MG/ML IJ SOLN
10.0000 mg | Freq: Once | INTRAMUSCULAR | Status: AC
  Administered 2023-07-23: 10 mg via INTRAVENOUS
  Filled 2023-07-23: qty 2

## 2023-07-23 MED ORDER — FLUTICASONE PROPIONATE 50 MCG/ACT NA SUSP
2.0000 | Freq: Every day | NASAL | Status: DC
  Administered 2023-07-24 – 2023-07-25 (×2): 2 via NASAL
  Filled 2023-07-23 (×2): qty 16

## 2023-07-23 MED ORDER — LORAZEPAM 0.5 MG PO TABS
0.5000 mg | ORAL_TABLET | Freq: Two times a day (BID) | ORAL | Status: DC
  Filled 2023-07-23 (×2): qty 1

## 2023-07-23 MED ORDER — LORATADINE 10 MG PO TABS
10.0000 mg | ORAL_TABLET | Freq: Every day | ORAL | Status: DC
  Administered 2023-07-25: 10 mg via ORAL
  Filled 2023-07-23 (×3): qty 1

## 2023-07-23 MED ORDER — WARFARIN - PHARMACIST DOSING INPATIENT: Freq: Every day | Status: DC

## 2023-07-23 MED ORDER — LAMOTRIGINE 100 MG PO TABS
200.0000 mg | ORAL_TABLET | Freq: Two times a day (BID) | ORAL | Status: DC
  Administered 2023-07-23 – 2023-07-26 (×6): 200 mg via ORAL
  Filled 2023-07-23 (×5): qty 2
  Filled 2023-07-23: qty 8

## 2023-07-23 MED ORDER — MORPHINE SULFATE (PF) 2 MG/ML IV SOLN
1.0000 mg | Freq: Once | INTRAVENOUS | Status: AC
  Administered 2023-07-23: 1 mg via INTRAVENOUS
  Filled 2023-07-23: qty 1

## 2023-07-23 MED ORDER — DIPHENHYDRAMINE HCL 50 MG/ML IJ SOLN
25.0000 mg | Freq: Once | INTRAMUSCULAR | Status: AC
  Administered 2023-07-23: 25 mg via INTRAVENOUS
  Filled 2023-07-23: qty 1

## 2023-07-23 MED ORDER — LORAZEPAM 1 MG PO TABS
1.0000 mg | ORAL_TABLET | Freq: Once | ORAL | Status: AC
  Administered 2023-07-23: 1 mg via ORAL
  Filled 2023-07-23: qty 1

## 2023-07-23 MED ORDER — ACETAMINOPHEN 325 MG PO TABS: 650.0000 mg | ORAL_TABLET | Freq: Once | ORAL | Status: DC

## 2023-07-23 MED ORDER — KETOROLAC TROMETHAMINE 30 MG/ML IJ SOLN
30.0000 mg | Freq: Once | INTRAMUSCULAR | Status: AC
  Administered 2023-07-23: 30 mg via INTRAVENOUS
  Filled 2023-07-23: qty 1

## 2023-07-23 MED ORDER — ASPIRIN-ACETAMINOPHEN-CAFFEINE 250-250-65 MG PO TABS
2.0000 | ORAL_TABLET | Freq: Two times a day (BID) | ORAL | Status: DC | PRN
  Administered 2023-07-23: 2 via ORAL
  Filled 2023-07-23: qty 2

## 2023-07-23 MED ORDER — WARFARIN SODIUM 5 MG PO TABS
10.0000 mg | ORAL_TABLET | Freq: Once | ORAL | Status: DC
  Filled 2023-07-23: qty 1

## 2023-07-23 MED ORDER — DIPHENHYDRAMINE HCL 50 MG/ML IJ SOLN
12.5000 mg | Freq: Once | INTRAMUSCULAR | Status: AC
  Administered 2023-07-23: 12.5 mg via INTRAVENOUS
  Filled 2023-07-23: qty 1

## 2023-07-23 MED ORDER — DILTIAZEM HCL ER COATED BEADS 180 MG PO CP24
360.0000 mg | ORAL_CAPSULE | Freq: Every day | ORAL | Status: DC
  Administered 2023-07-23 – 2023-07-24 (×2): 360 mg via ORAL
  Filled 2023-07-23 (×3): qty 2

## 2023-07-23 MED ORDER — QUETIAPINE FUMARATE 50 MG PO TABS
50.0000 mg | ORAL_TABLET | Freq: Every day | ORAL | Status: DC
  Administered 2023-07-23 – 2023-07-25 (×4): 50 mg via ORAL
  Filled 2023-07-23 (×2): qty 1
  Filled 2023-07-23 (×2): qty 2

## 2023-07-23 MED ORDER — ENOXAPARIN SODIUM 120 MG/0.8ML IJ SOSY
1.5000 mg/kg | PREFILLED_SYRINGE | INTRAMUSCULAR | Status: DC
  Administered 2023-07-23: 117 mg via SUBCUTANEOUS
  Filled 2023-07-23 (×2): qty 0.78

## 2023-07-23 MED ORDER — LORAZEPAM 2 MG/ML IJ SOLN
1.0000 mg | Freq: Once | INTRAMUSCULAR | Status: AC
  Administered 2023-07-23: 1 mg via INTRAVENOUS
  Filled 2023-07-23: qty 1

## 2023-07-23 MED ORDER — ESCITALOPRAM OXALATE 10 MG PO TABS
10.0000 mg | ORAL_TABLET | Freq: Every morning | ORAL | Status: DC
  Administered 2023-07-23 – 2023-07-26 (×4): 10 mg via ORAL
  Filled 2023-07-23 (×4): qty 1

## 2023-07-23 MED ORDER — DILTIAZEM HCL ER COATED BEADS 180 MG PO CP24: 360.0000 mg | ORAL_CAPSULE | Freq: Every morning | ORAL | Status: DC

## 2023-07-23 MED ORDER — MAGNESIUM SULFATE 2 GM/50ML IV SOLN
2.0000 g | Freq: Once | INTRAVENOUS | Status: AC
  Administered 2023-07-23: 2 g via INTRAVENOUS
  Filled 2023-07-23: qty 50

## 2023-07-23 MED ORDER — POTASSIUM CHLORIDE CRYS ER 20 MEQ PO TBCR
40.0000 meq | EXTENDED_RELEASE_TABLET | Freq: Once | ORAL | Status: AC
Start: 2023-07-23 — End: 2023-07-23
  Administered 2023-07-23: 40 meq via ORAL
  Filled 2023-07-23: qty 2

## 2023-07-23 MED ORDER — CARBIDOPA-LEVODOPA 25-100 MG PO TABS
1.0000 | ORAL_TABLET | Freq: Three times a day (TID) | ORAL | Status: DC
  Administered 2023-07-23 – 2023-07-26 (×9): 1 via ORAL
  Filled 2023-07-23 (×10): qty 1

## 2023-07-23 MED ORDER — BUSPIRONE HCL 5 MG PO TABS
30.0000 mg | ORAL_TABLET | Freq: Two times a day (BID) | ORAL | Status: DC
  Administered 2023-07-23 – 2023-07-26 (×6): 30 mg via ORAL
  Filled 2023-07-23: qty 6
  Filled 2023-07-23 (×2): qty 3
  Filled 2023-07-23 (×3): qty 6

## 2023-07-23 MED ORDER — LUMATEPERONE TOSYLATE 42 MG PO CAPS
42.0000 mg | ORAL_CAPSULE | Freq: Every day | ORAL | Status: DC
  Filled 2023-07-23: qty 1

## 2023-07-23 MED ORDER — LORAZEPAM 1 MG PO TABS: 0.5000 mg | ORAL_TABLET | Freq: Once | ORAL | Status: DC

## 2023-07-23 NOTE — Telephone Encounter (Signed)
 Francoise Eggett, spouse of Emily Rice called stating that she is in crisis. Delon Ferris said she has been admitted to [facility] for being verbally abusive, yelling and screaming, manic bipolar rage outbursts and is now in a-fib. Spouses phone number is [phone].

## 2023-07-23 NOTE — ED Notes (Signed)
 Pt HR maintained 60-80 consistently, verbal order from ed PA to hold IV cardizem  at this time.  Pt took home dose of warfarin tonight despite teaching from RN, provider and pharmacy aware.

## 2023-07-23 NOTE — ED Notes (Signed)
 Patient removed all leads and refused to be hooked back up. States she wants to leave and does not want to stay at the hospital. Patient became verbally aggressive and attempted to be redirected but started making racist remarks to the RN. Patient was told to return to her room or security would be called.

## 2023-07-23 NOTE — Progress Notes (Signed)
 PHARMACY - ANTICOAGULATION CONSULT NOTE  Pharmacy Consult for warfarin and enoxaparin   Indication: atrial fibrillation, hx DVT/PE  Allergies  Allergen Reactions   Hydromorphone  Other (See Comments)    Stroke-like symptoms. treated with Narcan; Tolerated Percocet in the past.   Iodinated Contrast Media Other (See Comments)    Reaction not fully known   Prochlorperazine Swelling and Other (See Comments)    Compazine - tongue swelling    Patient Measurements: Height: 5\' 3"  (160 cm) Weight: 77.5 kg (170 lb 13.7 oz) IBW/kg (Calculated) : 52.4 HEPARIN DW (KG): 69.1  Vital Signs: Temp: 98.4 F (36.9 C) (05/13 1009) Temp Source: Oral (05/13 1009) BP: 142/91 (05/13 1000) Pulse Rate: 65 (05/13 1015)  Labs: Recent Labs    07/22/23 2106 07/22/23 2322 07/23/23 0528 07/23/23 0927  HGB 13.3  --  13.3  --   HCT 39.5  --  39.1  --   PLT 213  --  204  --   LABPROT 18.1*  --   --  19.0*  INR 1.5*  --   --  1.6*  CREATININE 1.01*  --  0.89  --   TROPONINIHS 6 5  --   --     Estimated Creatinine Clearance: 66.2 mL/min (by C-G formula based on SCr of 0.89 mg/dL).   Medical History: Past Medical History:  Diagnosis Date   A-fib [facility])    Allergy    Anxiety    Bipolar disorder (HCC)    Chondromalacia patellae 12/14/2003   Depression    DVT (deep vein thrombosis) in pregnancy    DVT (deep venous thrombosis) (HCC) 2001 and 2002   DVT (deep venous thrombosis) (HCC)    x 2   Enlarged pancreas 07/02/2023   Fulness of pancreatic head on CT    Esophagitis 06/06/2016   GERD (gastroesophageal reflux disease)    Hemorrhage    Hyperlipidemia    Migraines    Pulmonary embolism (HCC) 2001   S/P cholecystectomy 09/11/2012   Sickle cell anemia (HCC)    Suicidal ideation 12/28/2008   Tremor    Assessment: Emily Rice is a 63 y.o. year old female presented on 07/22/2023 with concern for afib RVR. On warfarin prior to admission for afib, hx DVT and PE . Previously switched to  eliquis  earlier this year, but developed new DVT. Prior to admission regimen, 5mg  MTWThFS and 7.5mg  Sun (total weekly dose 37.5mg ).   INR subtherapeutic, started on lovenox  bridge on admission  Goal of Therapy:  Anti-Xa level 0.6-1 units/ml 4hrs after LMWH dose given INR goal 2-3 Monitor platelets by anticoagulation protocol: Yes   Plan:  Warfarin 10mg  PO x 1 today Daily INR, s/s bleeding  Trinidad Funk, PharmD, [facility] Clinical Pharmacist ED Pharmacist Phone # [phone] 07/23/2023 10:45 AM

## 2023-07-23 NOTE — Telephone Encounter (Signed)
 LVM to [facility]

## 2023-07-23 NOTE — Progress Notes (Signed)
 PHARMACY - ANTICOAGULATION CONSULT NOTE  Pharmacy Consult for warfarin and enoxaparin   Indication: atrial fibrillation, hx DVT/PE  Allergies  Allergen Reactions   Hydromorphone  Other (See Comments)    Stroke-like symptoms. treated with Narcan; Tolerated Percocet in the past.   Iodinated Contrast Media Other (See Comments)    Reaction not fully known   Prochlorperazine Swelling and Other (See Comments)    Compazine - tongue swelling    Patient Measurements:    Vital Signs: Temp: 98.6 F (37 C) (05/12 2111) Temp Source: Oral (05/12 2111) BP: 158/96 (05/12 2145) Pulse Rate: 89 (05/12 2145)  Labs: Recent Labs    07/22/23 2106 07/22/23 2322  HGB 13.3  --   HCT 39.5  --   PLT 213  --   LABPROT 18.1*  --   INR 1.5*  --   CREATININE 1.01*  --   TROPONINIHS 6 5    Estimated Creatinine Clearance: 58.4 mL/min (A) (by C-G formula based on SCr of 1.01 mg/dL (H)).   Medical History: Past Medical History:  Diagnosis Date   A-fib [facility])    Allergy    Anxiety    Bipolar disorder (HCC)    Chondromalacia patellae 12/14/2003   Depression    DVT (deep vein thrombosis) in pregnancy    DVT (deep venous thrombosis) (HCC) 2001 and 2002   DVT (deep venous thrombosis) (HCC)    x 2   Enlarged pancreas 07/02/2023   Fulness of pancreatic head on CT    Esophagitis 06/06/2016   GERD (gastroesophageal reflux disease)    Hemorrhage    Hyperlipidemia    Migraines    Pulmonary embolism (HCC) 2001   S/P cholecystectomy 09/11/2012   Sickle cell anemia (HCC)    Suicidal ideation 12/28/2008   Tremor    Assessment: Emily Rice is a 63 y.o. year old female presented on 07/22/2023 with concern for afib RVR. On warfarin prior to admission for afib, hx DVT and PE . Previously switched to eliquis  earlier this year, but developed new DVT. Prior to admission regimen, 5mg  MTWThFS and 7.5mg  Sun (total weekly dose 37.5mg ). Last dose prior to admission 5/12 9pm. Pharmacy consulted for warfarin  inpatient management and therapeutic enoxaparin .   Date INR Warfarin Dose  5/12 1.5, subtherapeutic  5mg  (taken by pt prior to hospitalization)       Goal of Therapy:  Anti-Xa level 0.6-1 units/ml 4hrs after LMWH dose given INR goal 2-3 Monitor platelets by anticoagulation protocol: Yes   Plan:  Enoxaparin  117mg  (1.5mg /kg) q24h  Plans to bridge with enoxaparin  until INR therapeutic on warfarin F/u daily INR, CBC, and warfarin dosing   Thank you for allowing pharmacy to participate in this patient's care.  Fonda Hymen, PharmD Emergency Medicine Clinical Pharmacist 07/23/2023,3:16 AM

## 2023-07-23 NOTE — ED Notes (Signed)
 Dr. Janee Morn at bedside.

## 2023-07-23 NOTE — ED Notes (Signed)
 Patient transported to CT

## 2023-07-23 NOTE — ED Notes (Signed)
 Pt to vascular.

## 2023-07-23 NOTE — Care Management Important Message (Signed)
 Important Message  Patient Details  Name: ROSEMARY CRAIGE MRN: [id number] Date of Birth: [birth year]   Important Message Given:   YES    Isadora Delorey, RN 07/23/2023, 3:44 PM

## 2023-07-23 NOTE — Progress Notes (Signed)
  Echocardiogram 2D Echocardiogram has been performed.  [name] 07/23/2023, 11:17 AM

## 2023-07-23 NOTE — Telephone Encounter (Signed)
 Please see message from [place]. ED note said she is going to be admitted. Told him that we were hands off until she is discharged. He said she is focused on A-fib and not MH issues. He is trying to make sure the hospital is aware of her MH issues.

## 2023-07-23 NOTE — Progress Notes (Signed)
 I have seen and assessed patient and agree with Dr. Kent Pear assessment and plan. Patient is 63 year old female history of paroxysmal A-fib, hypertension, prior history of DVT and PE, anxiety, sickle cell trait, TBI/subarachnoid hemorrhage presented to the ED with complaints of chest pain and palpitations.  Patient noted to be in A-fib with RVR, placed on a Cardizem  drip.  Cardiology consulted.  Patient also with complaints of persistent headache and given Benadryl  and Reglan  for the headache. Assessment/plan #1 A-fib with RVR -??  Etiology. -Cardiac enzymes negative x 2.  TSH of 3.945. -2D echo pending. -Patient placed on a Cardizem  drip, rate improved and drip currently off this morning. -Patient on Lovenox  bridge with Coumadin . -Cardiology consulted and following.  2.  Chest pain -In the setting of A-fib. -2D echo pending. -Cardiac enzymes negative. -D-dimer negative. -Improving clinically. -Cardiology consulted and following.  3.  Migraine headaches -CT head unremarkable. -Admitting physician discussed with neurology and patient given Reglan  and Benadryl  for headache. -Resume home regimen Excedrin as needed.  4.  Hypertension -On Cardizem .  5.  Bipolar disorder and anxiety disorder -Continue home regimen of Seroquel , Caplyta , BuSpar , Lexapro , Ativan .  Patient noted to be on levodopa  for tremors.  6.  History of DVT/PE -D-dimer negative. -On Coumadin  prior to admission. -INR subtherapeutic. -On Lovenox  bridge with Coumadin .  No charge.

## 2023-07-23 NOTE — Progress Notes (Signed)
 Informed by RN that patient wants to leave AMA.  I spoke to the patient and she is now willing to stay.  Her main concern is her severe migraine headache.  She received Reglan , Benadryl , and Excedrin earlier during the day today which did not help.  Patient reports history of chronic migraine headaches for which she has been seen by neurology at [facility] clinic in the past.  She is allergic to Compazine.  Discussed with neurology and recommendation is to give another dose of IV Reglan  10 mg along with IV Toradol  30 mg, IV mag 2 g, and IV Benadryl  12.5 mg.  If patient continues to have headaches 4-5 hours later, then neurology recommending giving IV Depacon 1 g.  Appreciate recommendations.

## 2023-07-23 NOTE — ED Notes (Signed)
Pt ambulatory to restroom with standby assist

## 2023-07-23 NOTE — Progress Notes (Addendum)
 Patient Name: Emily Rice Date of Encounter: 07/23/2023 [facility] Cardiologist: Olinda Bertrand, DO   Interval Summary  .    Was initially diagnosed with atrial fibrillation in 2014. Continues to have chest discomfort but is less severe than previously.  Vital Signs .    Vitals:   07/23/23 0445 07/23/23 0600 07/23/23 0622 07/23/23 0700  BP: 134/78 121/78  108/70  Pulse: (!) 59 61  63  Resp: 18 20  (!) 21  Temp:   97.9 F (36.6 C)   TempSrc:      SpO2: 99% 96%  94%  Weight:      Height:       No intake or output data in the 24 hours ending 07/23/23 0950    07/23/2023    3:01 AM 07/11/2023    1:17 PM 07/01/2023   10:00 AM  Last 3 Weights  Weight (lbs) 170 lb 13.7 oz 171 lb 167 lb  Weight (kg) 77.5 kg 77.565 kg 75.751 kg      Telemetry/ECG    Normal sinus rhythm with frequent ectopy. Had brief episode of a fib yesterday. Continuing to have brief episodes of SVT/afib - Personally Reviewed  Physical Exam .   GEN: No acute distress.   Neck: No JVD Cardiac: RRR, no murmurs, rubs, or gallops.  Respiratory: Clear to auscultation bilaterally. GI: Soft, nontender, non-distended  MS: No edema  Assessment & Plan .     Paroxysmal Afib with RVR Hypertension CHA2DS2-VASc Score = 2 [CHF History: 0, HTN History: 1, Diabetes History: 0, Stroke History: 0, Vascular Disease History: 0, Age Score: 0, Gender Score: 1].  Therefore, the patient's annual risk of stroke is 2.2 %.    Has a prior history of atrial fibrillation that was initially diagnosed in 2014. Initially came in Atrial fibrillation with RVR but rates on telemetry tended to fluctuate a lot. Has had chest pain and shortness of breath. Recent INR have been 1.5 to 1.6 over the past few weeks. Patient has had symptoms from atrial fibrillation about 1-2 times per day. Reported that she is continuing to have chest discomfort but that it is less severe than previously. Echo showed LVEF of 55-60% no regional wall motion  abnormalities and normal valve structure. -- high sensitivity troponin's negative. No acute ischemic changes on EKG --Stop IV diltizem. Restart home cardizem  CD 360 daily --continue warfarin, INR today was low  consider dose uptitration per pharmacy --using lovenox  to bridge warfarin. --Goal k~4, Mag~2. K 3.5 Has had potassium replaced. -- Consider outpatient cardiac monitor to assess Afib burden. -- has outpatient follow up with Dr Marven Slimmer on 08/14/23 to discuss ablation.  History of recurrent DVT/ PE -- Previously developed a DVT on eliquis  and was transitioned back to warfarin.  -- Was on warfarin PTA -- D dimer negative and lower extremity doppler negative.  Headache history of migrains. Head CT showed no acute changes.    For questions or updates, please contact [facility] Please consult [email or web address] for contact info under        Signed, Melita Springer, PA-C   ATTENDING ATTESTATION:  After conducting a review of all available clinical information with the care team, interviewing the patient, and performing a physical exam, I agree with the findings and plan described in this note.   GEN: No acute distress.   HEENT:  MMM, no JVD, no scleral icterus Cardiac: RRR with occ ectopy, R chest wall is tender to palpation Respiratory: Clear to  auscultation bilaterally. GI: Soft, nontender, non-distended  MS: No edema; No deformity. Neuro:  Nonfocal  Vasc:  +2 radial pulses  Patient reports chest pain despite converting to NSR with occasional salvos of AF.  Restart home cardizem  360.  Patient's chest is TTP and this seem to be improving.  Would continue to treat muskuloskeletal chest pain; no need for ischemic evaluation (stress 2022 low risk).   OTW patient has outpatient f/u with EP to discuss ablation.    Alyssa Backbone, MD Pager [phone]

## 2023-07-23 NOTE — Consult Note (Signed)
 Cardiology Consultation   Patient ID: Emily Rice MRN: [id number]; DOB: 1960/04/09  Admit date: 07/22/2023 Date of Consult: 07/23/2023  PCP:  Abram Abraham, NP-C   [facility] Providers Cardiologist:  Olinda Bertrand, DO  Electrophysiologist:  Boyce Byes, MD       History of Present Illness:   Emily Rice is a 63 y.o. female with a hx of AF (on warfarin), HTN, TBI/SAH, PE, DVT (2002), bipolar disorder, anxiety, sickle cell trait who is being seen 07/23/2023 for the evaluation of Afib and chest pain at the request of the ER.  Patient reports that around 1700 today she was sitting down when she felt acute onset chest pressure up to 9/10 with radiation to her right shoulder with associated shortness of breath. She denied any diaphoresis, nausea, lightheadedness, dizziness, syncope. She felt like it was similar to her prior episodes of atrial fibrillation where she could feel her heart racing and with palpitations. She got into an argument with her husband who told her she was having a manic episode.   On EMS arrival, patient was found to be in Afib with HR 130-140s. Provided ASA 324mg  and nitroglycerin 0.4mg  x2 with some mild improvement of her symptoms.   She reports adherence to her diltiazem  360mg  daily and her warfarin 5mg  daily. However, she recently did start going to a weight loss clinic and eating healthier and had been eating more dark leafy vegetables. She was recently planned for follow-up with Dr. Marven Slimmer for consideration of ablation, however, she missed this appointment due to a migraine.   In the ER, telemetry notable for paroxysms of Afib with rates fluctuating from 60 -130s. She continues to be symptomatic reporting palpitations, heart racing, and chest pain.   Past Medical History:  Diagnosis Date   A-fib [facility])    Allergy    Anxiety    Bipolar disorder (HCC)    Chondromalacia patellae 12/14/2003   Depression    DVT (deep vein thrombosis) in  pregnancy    DVT (deep venous thrombosis) (HCC) 2001 and 2002   DVT (deep venous thrombosis) (HCC)    x 2   Enlarged pancreas 07/02/2023   Fulness of pancreatic head on CT    Esophagitis 06/06/2016   GERD (gastroesophageal reflux disease)    Hemorrhage    Hyperlipidemia    Migraines    Pulmonary embolism (HCC) 2001   S/P cholecystectomy 09/11/2012   Sickle cell anemia (HCC)    Suicidal ideation 12/28/2008   Tremor     Past Surgical History:  Procedure Laterality Date   CHOLECYSTECTOMY  2014   CHOLECYSTECTOMY     HUMERUS IM NAIL Right 03/03/2020   Procedure: INTRAMEDULLARY (IM) NAIL HUMERAL;  Surgeon: Laneta Pintos, MD;  Location: [facility] OR;  Service: Orthopedics;  Laterality: Right;   OVARIAN CYST REMOVAL     TONSILLECTOMY       Home Medications:  Prior to Admission medications   Medication Sig Start Date End Date Taking? Authorizing Provider  aspirin-acetaminophen -caffeine (EXCEDRIN MIGRAINE) 250-250-65 MG tablet Take 2 tablets by mouth 2 (two) times daily as needed for migraine.   Yes [provider]  busPIRone  (BUSPAR ) 30 MG tablet Take 1 tablet (30 mg total) by mouth 2 (two) times daily. 07/08/23  Yes Mozingo, [name] Nattalie, NP  CAPLYTA  42 MG capsule TAKE 1 CAPSULE EVERY DAY 03/25/23  Yes Mozingo, [name] Nattalie, NP  carbidopa -levodopa  (SINEMET  IR) 25-100 MG tablet Take one tablet in the am and one  tablet in the pm Patient taking differently: Take 1 tablet by mouth 3 (three) times daily. Take one tablet in the am and one tablet in the pm 05/25/20  Yes Swartz, Zachary T, MD  cetirizine (ZYRTEC) 10 MG tablet Take 10 mg by mouth daily as needed for allergies.   Yes [provider]  Cholecalciferol  75 MCG (3000 UT) TABS Take 3,000 Units by mouth daily. 04/04/20  Yes Angiulli, Everlyn Hockey, PA-C  diltiazem  (CARDIZEM  CD) 360 MG 24 hr capsule Take 1 capsule (360 mg total) by mouth every morning. 06/28/23  Yes Ollis, Trina Fujita, NP  diphenhydrAMINE  (BENADRYL  ALLERGY EXTRA  STR) 50 MG tablet Take 1 tablet (50 mg total) by mouth once for 1 dose. Take 1 tablet 1 hour prior to CT scan. 03/30/22 10/13/23 Yes Boyce Byes, MD  escitalopram  (LEXAPRO ) 10 MG tablet TAKE 1 TABLET AT BEDTIME Patient taking differently: Take 10 mg by mouth every morning. 10/31/22  Yes Mozingo, [name] Nattalie, NP  fexofenadine (ALLEGRA) 180 MG tablet Take 180 mg by mouth as needed for allergies or rhinitis.   Yes [provider]  fluticasone (FLONASE) 50 MCG/ACT nasal spray Place 1 spray into both nostrils daily as needed for allergies.    Yes [provider]  lamoTRIgine  (LAMICTAL ) 200 MG tablet TAKE 1 TABLET TWICE DAILY 05/12/23  Yes Mozingo, [name] Nattalie, NP  LORazepam  (ATIVAN ) 0.5 MG tablet Take 1 tablet (0.5 mg total) by mouth 2 (two) times daily. 07/09/23  Yes Mozingo, [name] Nattalie, NP  metFORMIN  (GLUCOPHAGE -XR) 500 MG 24 hr tablet Take 2 tablets (1,000 mg total) by mouth 2 (two) times daily with a meal. 07/01/23  Yes Ladd Picker, MD  Multiple Vitamin (MULTIVITAMIN WITH MINERALS) TABS tablet Take 1 tablet by mouth daily. Patient taking differently: Take 1 tablet by mouth daily. Centrum 50+ 04/05/20  Yes Angiulli, [name] J, PA-C  polyethylene glycol (MIRALAX  / GLYCOLAX ) 17 g packet Take 17 g by mouth 2 (two) times daily. Patient taking differently: Take 17 g by mouth daily as needed for mild constipation. 04/04/20  Yes Angiulli, Everlyn Hockey, PA-C  QUEtiapine  (SEROQUEL ) 50 MG tablet TAKE 1 TABLET AT BEDTIME 04/11/23  Yes Mozingo, [name] Nattalie, NP  warfarin (COUMADIN ) 5 MG tablet Take 5-7.5 mg by mouth See admin instructions. Take 1 tablet by mouth daily, then take 1 and 1/2 tablet every Sunday 12/15/21  Yes [provider]  acetaminophen  (TYLENOL ) 325 MG tablet Take 2 tablets (650 mg total) by mouth every 6 (six) hours as needed for mild pain or moderate pain. Patient not taking: Reported on 07/23/2023 04/04/20   Angiulli, Everlyn Hockey, PA-C  lumateperone  tosylate  (CAPLYTA ) 42 MG capsule Take 1 capsule (42 mg total) by mouth daily. 01/11/23   Mozingo, [name] Nattalie, NP    Inpatient Medications: Scheduled Meds:  enoxaparin  (LOVENOX ) injection  1.5 mg/kg Subcutaneous Q24H   LORazepam   1 mg Oral Once   Continuous Infusions:  diltiazem  (CARDIZEM ) infusion     PRN Meds:   Allergies:    Allergies  Allergen Reactions   Hydromorphone  Other (See Comments)    Stroke-like symptoms. treated with Narcan; Tolerated Percocet in the past.   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   Right-handed.   No daily use of caffeine.   Lives with husband.-2025      Social Drivers of Health   Financial Resource Strain: Low Risk  (03/29/2023)   Overall Financial Resource Strain (CARDIA)    Difficulty of Paying Living Expenses: Not hard at all  Food Insecurity: No Food Insecurity (03/29/2023)   Hunger Vital Sign    Worried About Running Out  of Food in the Last Year: Never true    Ran Out of Food in the Last Year: Never true  Transportation Needs: No Transportation Needs (03/29/2023)   PRAPARE - Administrator, Civil Service (Medical): No    Lack of Transportation (Non-Medical): No  Recent Concern: Transportation Needs - Unmet Transportation Needs (02/28/2023)   PRAPARE - Transportation    Lack of Transportation (Medical): Yes    Lack of Transportation (Non-Medical): Patient declined  Physical Activity: Sufficiently Active (03/29/2023)   Exercise Vital Sign    Days of Exercise per Week: 5 days    Minutes of Exercise per Session: 60 min  Stress: Stress Concern Present (03/29/2023)   [employer] of Occupational Health - Occupational Stress Questionnaire    Feeling of Stress : To some extent  Social Connections: Moderately Isolated (03/29/2023)   Social Connection and Isolation Panel [NHANES]    Frequency of Communication with Friends and Family: More than three times a week    Frequency of Social Gatherings with Friends and Family: More than three times a week    Attends Religious Services: Never    Database administrator or Organizations: No    Attends Banker Meetings: Never    Marital Status: Married  Catering manager Violence: Not At Risk (03/29/2023)   Humiliation, Afraid, Rape, and Kick questionnaire    Fear of Current or Ex-Partner: No    Emotionally Abused: No    Physically Abused: No    Sexually Abused: No    Family History:   Family History  Problem Relation Age of Onset   Hypertension Mother    Stroke Father    Hypertension Father    Atrial fibrillation Father    Heart disease Father    Diabetes Sister    CAD Brother    Anxiety disorder Daughter    ADD / ADHD Son    Depression Son    Hypertension Maternal Uncle    Liver disease Neg Hx    Esophageal cancer Neg Hx    Colon cancer Neg Hx      ROS:  Please see the history of present illness.  All other ROS reviewed and  negative.     Physical Exam/Data:   Vitals:   07/22/23 2145 07/22/23 2230 07/22/23 2300 07/23/23 0015  BP: (!) 158/96 (!) 151/93 (!) 160/93 (!) 157/67  Pulse: 89 (!) 58 86 76  Resp: 17 17    Temp:      TempSrc:      SpO2: 100% 100% 99% 99%   No intake or output data in the 24 hours ending 07/23/23 0118    07/11/2023    1:17 PM 07/01/2023   10:00 AM 06/28/2023    3:36 PM  Last 3 Weights  Weight (lbs) 171 lb 167 lb 171 lb  Weight (kg) 77.565 kg 75.751 kg 77.565 kg     There is no height or weight on file to calculate BMI.  General:  Well nourished, well developed, in mild distress HEENT: normal Neck: no JVD Vascular: No carotid bruits; Distal pulses 2+ bilaterally Cardiac:  irregular, tachycardic, S1, S2; RRR; Lungs:  clear to auscultation bilaterally, no wheezing, rhonchi or rales  Abd: soft, nontender, no hepatomegaly  Ext: no edema Skin: warm and dry  Neuro:  tremulous notably in her head Psych:  Normal affect   EKG:  The EKG was personally reviewed and demonstrates:  Afib with vent rate 91 Telemetry:  Telemetry was personally reviewed and demonstrates:  paroxysms of Afib with rates fluctuating from 60 -130s.   Relevant CV Studies: Echocardiogram 02/27/2021: Left ventricle cavity is normal in size and wall thickness. Normal global wall motion. Normal LV systolic function with EF 60%. Normal diastolic filling pattern. No significant valvular abnormality. Normal right atrial pressure. Mild MR, TR seen in 09/2019 not appreciated on this study.  Exercise/Lexiscan  Tetrofosmin stress test 02/27/2021: No previous exam available for comparison. Exercise nuclear stress test was performed using Bruce protocol. Patient reached 4.8 METS, and 58% of age predicted maximum heart rate. Blood pressure response was normal. Exercise capacity was low.  Because of sub-maximal HR response to exercise and fatigue, the patient was injected with a dose of intravenous Lexiscan . No chest pain  reported. Dyspnea and dizziness reported post lexiscan  injection. Stress EKG at 58% MPHR showed sinus tachycardia, nonspecific ST-T changes. SPECT images show small sized, mild intensity, reversible perfusion defect in basal inferoseptal myocardium. Stress LVEF 59%. Low risk study.    Laboratory Data:  High Sensitivity Troponin:   Recent Labs  Lab 07/22/23 2106 07/22/23 2322  TROPONINIHS 6 5     Chemistry Recent Labs  Lab 07/22/23 2106  NA 137  K 3.7  CL 105  CO2 19*  GLUCOSE 105*  BUN 14  CREATININE 1.01*  CALCIUM 9.2  GFRNONAA >60  ANIONGAP 13    No results for input(s): "PROT", "ALBUMIN ", "AST", "ALT", "ALKPHOS", "BILITOT" in the last 168 hours. Lipids No results for input(s): "CHOL", "TRIG", "HDL", "LABVLDL", "LDLCALC", "CHOLHDL" in the last 168 hours.  Hematology Recent Labs  Lab 07/22/23 2106  WBC 11.3*  RBC 5.04  HGB 13.3  HCT 39.5  MCV 78.4*  MCH 26.4  MCHC 33.7  RDW 15.5  PLT 213   Thyroid  No results for input(s): "TSH", "FREET4" in the last 168 hours.  BNPNo results for input(s): "BNP", "PROBNP" in the last 168 hours.  DDimer No results for input(s): "DDIMER" in the last 168 hours.   Radiology/Studies:  DG Chest Portable 1 View Result Date: 07/22/2023 CLINICAL DATA:  Chest pain and shortness of breath EXAM: PORTABLE CHEST 1 VIEW COMPARISON:  12/06/2022 FINDINGS: Cardiac shadow is  within normal limits. Lungs are clear bilaterally. Postsurgical changes in the right humerus are seen. IMPRESSION: No active disease. Electronically Signed   By: Violeta Grey M.D.   On: 07/22/2023 22:03     Assessment and Plan:   #Afib with RVR Patient presenting with symptomatic paroxysmal Afib with rates up to 120-130s with associated chest pain and mild shortness of breath. Troponins negative x2; has a prior negative nuclear stress test, no st changes appreciated when patient is in AF with RVR. Would plan to continue diltiazem  gtt overnight given severe symptoms. INR  has persistently been low ~1.5-1.6 over the past 2 weeks, would recommend dose uptitration and consider bridging while inpatient.  CHA2DS2VASC score is 2.  --start diltiazem  gtt  --hold home diltiazem  360mg  po qd --continue warfarin, consider dose uptitration given change in PO intake --consider bridge with lovenox  while inpatient  --obtain repeat TTE --continue telemetry --Goal k~4, Mag~2 --f/u outpatient for consideration of ablation   Risk Assessment/Risk Scores:  CHA2DS2-VASc Score = 2   For questions or updates, please contact  [facility] Please consult [email or web address] for contact info under   Signed, Milbert Alice, MD  07/23/2023 1:18 AM

## 2023-07-23 NOTE — H&P (Signed)
 History and Physical    Emily Rice BJY:[id number] DOB: 1960-07-17 DOA: 07/22/2023  Patient coming from: Home.  Chief Complaint: Chest pain and palpitations.  HPI: Emily Rice is a 63 y.o. female with history of paroxysmal atrial fibrillation, type II disorder, hypertension, prior history of DVT and PE, anxiety, sickle cell trait, TBI/subarachnoid hemorrhage presents to the ER with complaint of chest pain and palpitation.  Patient states she has been having palpitation off and on for last few days and was originally supposed to be following with electrophysiologist last week for possible discussion about ablation but patient was unable to keep the appointment due to patient having migraine attack over the last 3 to 4 days.  Last evening while at home patient started having right-sided chest pain which was pressure-like along with palpitation and patient presents to the ER.  ED Course: In the ER patient was found to be in A-fib with RVR.  Troponins were negative.  Chest x-ray unremarkable.  D-dimer was negative.  Cardiologist on-call was consulted.  Patient was started on Cardizem  infusion.  Since INR was subtherapeutic patient takes Coumadin  Lovenox  was ordered to bridge.  Patient's CT head was unremarkable.  Patient has been having persistent headache and I did discuss with neurologist plan is to give Reglan .  Review of Systems: As per HPI, rest all negative.   Past Medical History:  Diagnosis Date   A-fib [facility])    Allergy    Anxiety    Bipolar disorder (HCC)    Chondromalacia patellae 12/14/2003   Depression    DVT (deep vein thrombosis) in pregnancy    DVT (deep venous thrombosis) (HCC) 2001 and 2002   DVT (deep venous thrombosis) (HCC)    x 2   Enlarged pancreas 07/02/2023   Fulness of pancreatic head on CT    Esophagitis 06/06/2016   GERD (gastroesophageal reflux disease)    Hemorrhage    Hyperlipidemia    Migraines    Pulmonary embolism (HCC) 2001   S/P  cholecystectomy 09/11/2012   Sickle cell anemia (HCC)    Suicidal ideation 12/28/2008   Tremor     Past Surgical History:  Procedure Laterality Date   CHOLECYSTECTOMY  2014   CHOLECYSTECTOMY     HUMERUS IM NAIL Right 03/03/2020   Procedure: INTRAMEDULLARY (IM) NAIL HUMERAL;  Surgeon: Laneta Pintos, MD;  Location: [facility] OR;  Service: Orthopedics;  Laterality: Right;   OVARIAN CYST REMOVAL     TONSILLECTOMY       reports that she has never smoked. She has never used smokeless tobacco. She reports that she does not currently use drugs after having used the following drugs: Benzodiazepines. Frequency: 7.00 times per week. She reports that she does not drink alcohol.  Allergies  Allergen Reactions   Hydromorphone  Other (See Comments)    Stroke-like symptoms. treated with Narcan; Tolerated Percocet in the past.   Iodinated Contrast Media Other (See Comments)    Reaction not fully known   Prochlorperazine Swelling and Other (See Comments)    Compazine - tongue swelling    Family History  Problem Relation Age of Onset   Hypertension Mother    Stroke Father    Hypertension Father    Atrial fibrillation Father    Heart disease Father    Diabetes Sister    CAD Brother    Anxiety disorder Daughter    ADD / ADHD Son    Depression Son    Hypertension Maternal Uncle    Liver disease  Neg Hx    Esophageal cancer Neg Hx    Colon cancer Neg Hx     Prior to Admission medications   Medication Sig Start Date End Date Taking? Authorizing Provider  aspirin-acetaminophen -caffeine (EXCEDRIN MIGRAINE) 250-250-65 MG tablet Take 2 tablets by mouth 2 (two) times daily as needed for migraine.   Yes [provider]  busPIRone  (BUSPAR ) 30 MG tablet Take 1 tablet (30 mg total) by mouth 2 (two) times daily. 07/08/23  Yes Mozingo, [name] Nattalie, NP  CAPLYTA  42 MG capsule TAKE 1 CAPSULE EVERY DAY 03/25/23  Yes Mozingo, [name] Nattalie, NP  carbidopa -levodopa  (SINEMET  IR) 25-100 MG tablet Take  one tablet in the am and one tablet in the pm Patient taking differently: Take 1 tablet by mouth 3 (three) times daily. Take one tablet in the am and one tablet in the pm 05/25/20  Yes Swartz, Zachary T, MD  cetirizine (ZYRTEC) 10 MG tablet Take 10 mg by mouth daily as needed for allergies.   Yes [provider]  Cholecalciferol  75 MCG (3000 UT) TABS Take 3,000 Units by mouth daily. 04/04/20  Yes Angiulli, Everlyn Hockey, PA-C  diltiazem  (CARDIZEM  CD) 360 MG 24 hr capsule Take 1 capsule (360 mg total) by mouth every morning. 06/28/23  Yes Ollis, Trina Fujita, NP  diphenhydrAMINE  (BENADRYL  ALLERGY EXTRA STR) 50 MG tablet Take 1 tablet (50 mg total) by mouth once for 1 dose. Take 1 tablet 1 hour prior to CT scan. 03/30/22 10/13/23 Yes Boyce Byes, MD  escitalopram  (LEXAPRO ) 10 MG tablet TAKE 1 TABLET AT BEDTIME Patient taking differently: Take 10 mg by mouth every morning. 10/31/22  Yes Mozingo, [name] Nattalie, NP  fexofenadine (ALLEGRA) 180 MG tablet Take 180 mg by mouth as needed for allergies or rhinitis.   Yes [provider]  fluticasone (FLONASE) 50 MCG/ACT nasal spray Place 1 spray into both nostrils daily as needed for allergies.    Yes [provider]  lamoTRIgine  (LAMICTAL ) 200 MG tablet TAKE 1 TABLET TWICE DAILY 05/12/23  Yes Mozingo, [name] Nattalie, NP  LORazepam  (ATIVAN ) 0.5 MG tablet Take 1 tablet (0.5 mg total) by mouth 2 (two) times daily. 07/09/23  Yes Mozingo, [name] Nattalie, NP  metFORMIN  (GLUCOPHAGE -XR) 500 MG 24 hr tablet Take 2 tablets (1,000 mg total) by mouth 2 (two) times daily with a meal. 07/01/23  Yes Ladd Picker, MD  Multiple Vitamin (MULTIVITAMIN WITH MINERALS) TABS tablet Take 1 tablet by mouth daily. Patient taking differently: Take 1 tablet by mouth daily. Centrum 50+ 04/05/20  Yes Angiulli, [name] J, PA-C  polyethylene glycol (MIRALAX  / GLYCOLAX ) 17 g packet Take 17 g by mouth 2 (two) times daily. Patient taking differently: Take 17 g by mouth  daily as needed for mild constipation. 04/04/20  Yes Angiulli, Everlyn Hockey, PA-C  QUEtiapine  (SEROQUEL ) 50 MG tablet TAKE 1 TABLET AT BEDTIME 04/11/23  Yes Mozingo, [name] Nattalie, NP  warfarin (COUMADIN ) 5 MG tablet Take 5-7.5 mg by mouth See admin instructions. Take 1 tablet by mouth daily, then take 1 and 1/2 tablet every Sunday 12/15/21  Yes [provider]  acetaminophen  (TYLENOL ) 325 MG tablet Take 2 tablets (650 mg total) by mouth every 6 (six) hours as needed for mild pain or moderate pain. Patient not taking: Reported on 07/23/2023 04/04/20   Sterling Eisenmenger, PA-C  lumateperone  tosylate (CAPLYTA ) 42 MG capsule Take 1 capsule (42 mg total) by mouth daily. 01/11/23   Mozingo, [name] Nattalie, NP    Physical Exam: Constitutional: Moderately  built and nourished. Vitals:   07/23/23 0301 07/23/23 0419 07/23/23 0430 07/23/23 0445  BP:  (!) 155/106 (!) 154/90 134/78  Pulse:  (!) 109 86 (!) 59  Resp:  16 (!) 21 18  Temp:      TempSrc:      SpO2:  99% 93% 99%  Weight: 77.5 kg     Height: 5\' 3"  (1.6 m)      Eyes: Anicteric no pallor. ENMT: No discharge from the ears eyes nose and mouth. Neck: No mass felt.  No neck rigidity. Respiratory: No rhonchi or crepitations. Cardiovascular: S1-S2 heard. Abdomen: Soft nontender bowel sounds present. Musculoskeletal: No edema. Skin: No rash. Neurologic: Alert awake oriented time place and person.  Moves all extremities. Psychiatric: Appears normal.  Normal affect.   Labs on Admission: I have personally reviewed following labs and imaging studies  CBC: Recent Labs  Lab 07/22/23 2106  WBC 11.3*  HGB 13.3  HCT 39.5  MCV 78.4*  PLT 213   Basic Metabolic Panel: Recent Labs  Lab 07/22/23 2106  NA 137  K 3.7  CL 105  CO2 19*  GLUCOSE 105*  BUN 14  CREATININE 1.01*  CALCIUM 9.2   GFR: Estimated Creatinine Clearance: 58.3 mL/min (A) (by C-G formula based on SCr of 1.01 mg/dL (H)). Liver Function Tests: No results for  input(s): "AST", "ALT", "ALKPHOS", "BILITOT", "PROT", "ALBUMIN " in the last 168 hours. No results for input(s): "LIPASE", "AMYLASE" in the last 168 hours. No results for input(s): "AMMONIA" in the last 168 hours. Coagulation Profile: Recent Labs  Lab 07/22/23 2106  INR 1.5*   Cardiac Enzymes: No results for input(s): "CKTOTAL", "CKMB", "CKMBINDEX", "TROPONINI" in the last 168 hours. BNP (last 3 results) Recent Labs    05/29/23 1642  PROBNP 66.0   HbA1C: No results for input(s): "HGBA1C" in the last 72 hours. CBG: Recent Labs  Lab 07/22/23 2133  GLUCAP 100*   Lipid Profile: No results for input(s): "CHOL", "HDL", "LDLCALC", "TRIG", "CHOLHDL", "LDLDIRECT" in the last 72 hours. Thyroid  Function Tests: No results for input(s): "TSH", "T4TOTAL", "FREET4", "T3FREE", "THYROIDAB" in the last 72 hours. Anemia Panel: No results for input(s): "VITAMINB12", "FOLATE", "FERRITIN", "TIBC", "IRON", "RETICCTPCT" in the last 72 hours. Urine analysis:    Component Value Date/Time   COLORURINE YELLOW 03/03/2020 0605   APPEARANCEUR CLEAR 03/03/2020 0605   LABSPEC 1.042 (H) 03/03/2020 0605   PHURINE 6.0 03/03/2020 0605   GLUCOSEU NEGATIVE 03/03/2020 0605   HGBUR MODERATE (A) 03/03/2020 0605   BILIRUBINUR negative 06/27/2023 0917   KETONESUR NEGATIVE 03/03/2020 0605   PROTEINUR Positive (A) 06/27/2023 0917   PROTEINUR 30 (A) 03/03/2020 0605   UROBILINOGEN 0.2 06/27/2023 0917   NITRITE negative 06/27/2023 0917   NITRITE NEGATIVE 03/03/2020 0605   LEUKOCYTESUR Small (1+) (A) 06/27/2023 0917   LEUKOCYTESUR NEGATIVE 03/03/2020 0605   Sepsis Labs: @LABRCNTIP (procalcitonin:4,lacticidven:4) )No results found for this or any previous visit (from the past 240 hours).   Radiological Exams on Admission: CT HEAD WO CONTRAST ( ) Result Date: 07/23/2023 CLINICAL DATA:  63 year old female with sudden onset chest pain, shortness of breath. Increased headache. Atrial fibrillation with RVR. EXAM: CT  HEAD WITHOUT CONTRAST TECHNIQUE: Contiguous axial images were obtained from the base of the skull through the vertex without intravenous contrast. RADIATION DOSE REDUCTION: This exam was performed according to the departmental dose-optimization program which includes automated exposure control, adjustment of the mA and/or kV according to patient size and/or use of iterative reconstruction technique. COMPARISON:  Brain MRI  and head CT 05/19/2022 FINDINGS: Brain: Cerebral volume is stable from last year, normal for age. No midline shift, ventriculomegaly, mass effect, evidence of mass lesion, intracranial hemorrhage or evidence of cortically based acute infarction. Mild for age cerebral white matter heterogeneity. Stable gray-white matter differentiation throughout the brain. Vascular: Calcified atherosclerosis at the skull base. No suspicious intracranial vascular hyperdensity. Skull: Stable and intact. Sinuses/Orbits: Visualized paranasal sinuses and mastoids are stable and well aerated. Other: Visualized orbits and scalp soft tissues are within normal limits. IMPRESSION: Stable since last year and largely negative for age non contrast CT appearance of the brain. Electronically Signed   By: Marlise Simpers M.D.   On: 07/23/2023 04:44   DG Chest Portable 1 View Result Date: 07/22/2023 CLINICAL DATA:  Chest pain and shortness of breath EXAM: PORTABLE CHEST 1 VIEW COMPARISON:  12/06/2022 FINDINGS: Cardiac shadow is within normal limits. Lungs are clear bilaterally. Postsurgical changes in the right humerus are seen. IMPRESSION: No active disease. Electronically Signed   By: Violeta Grey M.D.   On: 07/22/2023 22:03    EKG: Independently reviewed.  A-fib with RVR.  Assessment/Plan Principal Problem:   Atrial fibrillation with rapid ventricular response (HCC) Active Problems:   Chronic migraine w/o aura w/o status migrainosus, not intractable   Bipolar affective disorder, current episode mixed (HCC)   Paroxysmal  atrial fibrillation (HCC)   History of DVT (deep vein thrombosis)   Benign essential hypertension   Chest pain    A-fib with RVR appreciate cardiology consult.  Plan is to continue Cardizem  infusion but at the time of my exam patient has converted to sinus rhythm.  Will await further recommendation from cardiology.  Check TSH 2D echo.  Patient is on Lovenox  bridging for Coumadin  until therapeutic. Chest pain in the setting of A-fib with RVR.  Checking 2D echo with troponins and D-dimer has been negative. Migraine headaches with prior history of migraine CT head is unremarkable.  Discussed with Dr. Alecia Ames plan is to give Reglan  and Benadryl  for migraine attack.  Patient has been using Excedrin as outpatient. Hypertension on Cardizem . Bipolar disorder and anxiety disorder on Seroquel , Caplyta , BuSpar , Lexapro , Ativan  and takes levodopa  for tremors. Prior history of DVT and PE takes Coumadin .  D-dimer negative.  Since patient has A-fib with RVR with chest pain will need close monitoring more than 2 midnight stay.   DVT prophylaxis: Lovenox  and Coumadin . Code Status: Full code. Family Communication: Discussed with patient. Disposition Plan: Cardiac telemetry. Consults called: Cardiology. Admission status: Observation.

## 2023-07-24 ENCOUNTER — Telehealth (HOSPITAL_COMMUNITY): Payer: Self-pay | Admitting: Pharmacy Technician

## 2023-07-24 ENCOUNTER — Other Ambulatory Visit (HOSPITAL_COMMUNITY): Payer: Self-pay

## 2023-07-24 ENCOUNTER — Inpatient Hospital Stay (HOSPITAL_COMMUNITY)

## 2023-07-24 ENCOUNTER — Other Ambulatory Visit: Payer: Self-pay

## 2023-07-24 DIAGNOSIS — G43901 Migraine, unspecified, not intractable, with status migrainosus: Secondary | ICD-10-CM | POA: Diagnosis not present

## 2023-07-24 DIAGNOSIS — I471 Supraventricular tachycardia, unspecified: Secondary | ICD-10-CM | POA: Diagnosis present

## 2023-07-24 DIAGNOSIS — F3160 Bipolar disorder, current episode mixed, unspecified: Secondary | ICD-10-CM | POA: Diagnosis present

## 2023-07-24 DIAGNOSIS — Z7901 Long term (current) use of anticoagulants: Secondary | ICD-10-CM | POA: Diagnosis not present

## 2023-07-24 DIAGNOSIS — D571 Sickle-cell disease without crisis: Secondary | ICD-10-CM | POA: Diagnosis present

## 2023-07-24 DIAGNOSIS — I480 Paroxysmal atrial fibrillation: Secondary | ICD-10-CM | POA: Diagnosis present

## 2023-07-24 DIAGNOSIS — G4700 Insomnia, unspecified: Secondary | ICD-10-CM | POA: Diagnosis present

## 2023-07-24 DIAGNOSIS — Z8249 Family history of ischemic heart disease and other diseases of the circulatory system: Secondary | ICD-10-CM | POA: Diagnosis not present

## 2023-07-24 DIAGNOSIS — R791 Abnormal coagulation profile: Secondary | ICD-10-CM | POA: Diagnosis present

## 2023-07-24 DIAGNOSIS — Z833 Family history of diabetes mellitus: Secondary | ICD-10-CM | POA: Diagnosis not present

## 2023-07-24 DIAGNOSIS — R001 Bradycardia, unspecified: Secondary | ICD-10-CM | POA: Diagnosis present

## 2023-07-24 DIAGNOSIS — I4891 Unspecified atrial fibrillation: Secondary | ICD-10-CM | POA: Diagnosis present

## 2023-07-24 DIAGNOSIS — Z86711 Personal history of pulmonary embolism: Secondary | ICD-10-CM | POA: Diagnosis not present

## 2023-07-24 DIAGNOSIS — Z885 Allergy status to narcotic agent status: Secondary | ICD-10-CM | POA: Diagnosis not present

## 2023-07-24 DIAGNOSIS — F411 Generalized anxiety disorder: Secondary | ICD-10-CM | POA: Diagnosis present

## 2023-07-24 DIAGNOSIS — K219 Gastro-esophageal reflux disease without esophagitis: Secondary | ICD-10-CM | POA: Diagnosis present

## 2023-07-24 DIAGNOSIS — Z91041 Radiographic dye allergy status: Secondary | ICD-10-CM | POA: Diagnosis not present

## 2023-07-24 DIAGNOSIS — G43701 Chronic migraine without aura, not intractable, with status migrainosus: Secondary | ICD-10-CM | POA: Diagnosis present

## 2023-07-24 DIAGNOSIS — Z79899 Other long term (current) drug therapy: Secondary | ICD-10-CM | POA: Diagnosis not present

## 2023-07-24 DIAGNOSIS — Z86718 Personal history of other venous thrombosis and embolism: Secondary | ICD-10-CM | POA: Diagnosis not present

## 2023-07-24 DIAGNOSIS — Z888 Allergy status to other drugs, medicaments and biological substances status: Secondary | ICD-10-CM | POA: Diagnosis not present

## 2023-07-24 DIAGNOSIS — I10 Essential (primary) hypertension: Secondary | ICD-10-CM | POA: Diagnosis present

## 2023-07-24 DIAGNOSIS — Z823 Family history of stroke: Secondary | ICD-10-CM | POA: Diagnosis not present

## 2023-07-24 DIAGNOSIS — F429 Obsessive-compulsive disorder, unspecified: Secondary | ICD-10-CM | POA: Diagnosis present

## 2023-07-24 DIAGNOSIS — E785 Hyperlipidemia, unspecified: Secondary | ICD-10-CM | POA: Diagnosis present

## 2023-07-24 LAB — PROTIME-INR
INR: 1.4 — ABNORMAL HIGH (ref 0.8–1.2)
Prothrombin Time: 17.7 s — ABNORMAL HIGH (ref 11.4–15.2)

## 2023-07-24 LAB — URINALYSIS, ROUTINE W REFLEX MICROSCOPIC
Bilirubin Urine: NEGATIVE
Glucose, UA: NEGATIVE mg/dL
Hgb urine dipstick: NEGATIVE
Ketones, ur: NEGATIVE mg/dL
Nitrite: NEGATIVE
Protein, ur: NEGATIVE mg/dL
Specific Gravity, Urine: 1.009 (ref 1.005–1.030)
pH: 6 (ref 5.0–8.0)

## 2023-07-24 LAB — BASIC METABOLIC PANEL WITH GFR
Anion gap: 10 (ref 5–15)
BUN: 11 mg/dL (ref 6–20)
CO2: 23 mmol/L (ref 22–32)
Calcium: 8.8 mg/dL — ABNORMAL LOW (ref 8.9–10.3)
Chloride: 107 mmol/L (ref 98–111)
Creatinine, Ser: 0.97 mg/dL (ref 0.44–1.00)
GFR, Estimated: >60 mL/min (ref >60)
Glucose, Bld: 93 mg/dL (ref 70–99)
Potassium: 3.9 mmol/L (ref 3.5–5.1)
Sodium: 140 mmol/L (ref 135–145)

## 2023-07-24 LAB — CK: Total CK: 206 U/L (ref 38–234)

## 2023-07-24 MED ORDER — ONDANSETRON HCL 4 MG/2ML IJ SOLN
4.0000 mg | Freq: Four times a day (QID) | INTRAMUSCULAR | Status: DC | PRN
  Administered 2023-07-24: 4 mg via INTRAVENOUS
  Filled 2023-07-24: qty 2

## 2023-07-24 MED ORDER — DIPHENHYDRAMINE HCL 50 MG/ML IJ SOLN
12.5000 mg | Freq: Once | INTRAMUSCULAR | Status: AC
  Administered 2023-07-24: 12.5 mg via INTRAVENOUS
  Filled 2023-07-24: qty 1

## 2023-07-24 MED ORDER — AMIODARONE HCL IN DEXTROSE 360-4.14 MG/200ML-% IV SOLN
30.0000 mg/h | INTRAVENOUS | Status: DC
  Administered 2023-07-24: 30 mg/h via INTRAVENOUS
  Filled 2023-07-24: qty 200

## 2023-07-24 MED ORDER — OXYCODONE-ACETAMINOPHEN 5-325 MG PO TABS
1.0000 | ORAL_TABLET | Freq: Four times a day (QID) | ORAL | Status: DC | PRN
  Administered 2023-07-24 – 2023-07-26 (×4): 1 via ORAL
  Filled 2023-07-24 (×4): qty 1

## 2023-07-24 MED ORDER — LORAZEPAM 0.5 MG PO TABS
0.5000 mg | ORAL_TABLET | Freq: Three times a day (TID) | ORAL | Status: DC
  Administered 2023-07-24 – 2023-07-26 (×8): 0.5 mg via ORAL
  Filled 2023-07-24 (×7): qty 1

## 2023-07-24 MED ORDER — AMIODARONE HCL IN DEXTROSE 360-4.14 MG/200ML-% IV SOLN
60.0000 mg/h | INTRAVENOUS | Status: DC
  Administered 2023-07-24 (×2): 60 mg/h via INTRAVENOUS
  Filled 2023-07-24 (×2): qty 200

## 2023-07-24 MED ORDER — LUMATEPERONE TOSYLATE 42 MG PO CAPS
42.0000 mg | ORAL_CAPSULE | Freq: Every day | ORAL | Status: DC
  Administered 2023-07-24 – 2023-07-25 (×2): 42 mg via ORAL
  Filled 2023-07-24 (×3): qty 1

## 2023-07-24 MED ORDER — WARFARIN SODIUM 5 MG PO TABS
10.0000 mg | ORAL_TABLET | Freq: Once | ORAL | Status: AC
  Administered 2023-07-24: 10 mg via ORAL
  Filled 2023-07-24: qty 2

## 2023-07-24 MED ORDER — METOCLOPRAMIDE HCL 5 MG/ML IJ SOLN
10.0000 mg | Freq: Once | INTRAMUSCULAR | Status: AC
Start: 2023-07-24 — End: 2023-07-24
  Administered 2023-07-24: 10 mg via INTRAVENOUS
  Filled 2023-07-24: qty 2

## 2023-07-24 MED ORDER — ACETAMINOPHEN-CAFFEINE 500-65 MG PO TABS
1.0000 | ORAL_TABLET | Freq: Two times a day (BID) | ORAL | Status: DC | PRN
  Administered 2023-07-24 – 2023-07-25 (×2): 1 via ORAL
  Filled 2023-07-24 (×4): qty 1

## 2023-07-24 MED ORDER — ENOXAPARIN SODIUM 120 MG/0.8ML IJ SOSY
120.0000 mg | PREFILLED_SYRINGE | INTRAMUSCULAR | Status: DC
  Administered 2023-07-24 – 2023-07-26 (×3): 120 mg via SUBCUTANEOUS
  Filled 2023-07-24 (×4): qty 0.8

## 2023-07-24 MED ORDER — KETOROLAC TROMETHAMINE 15 MG/ML IJ SOLN
7.5000 mg | Freq: Once | INTRAMUSCULAR | Status: AC
  Administered 2023-07-24: 7.5 mg via INTRAVENOUS
  Filled 2023-07-24: qty 1

## 2023-07-24 MED ORDER — AMIODARONE LOAD VIA INFUSION
150.0000 mg | Freq: Once | INTRAVENOUS | Status: AC
  Administered 2023-07-24: 150 mg via INTRAVENOUS
  Filled 2023-07-24: qty 83.34

## 2023-07-24 MED ORDER — LORAZEPAM 1 MG PO TABS
1.0000 mg | ORAL_TABLET | Freq: Once | ORAL | Status: AC
  Administered 2023-07-24: 1 mg via ORAL
  Filled 2023-07-24: qty 1

## 2023-07-24 NOTE — Plan of Care (Signed)

## 2023-07-24 NOTE — Telephone Encounter (Signed)
 Patient Product/process development scientist completed.    The patient is insured through [employer]. Patient has Medicare and is not eligible for a copay card, but may be able to apply for patient assistance or Medicare RX Payment Plan (Patient Must reach out to their plan, if eligible for payment plan), if available.    Ran test claim for enoxaparin  (Lovenox ) 120 mg/0.8 ml and the current 5 day co-pay is $50.21.   This test claim was processed through [facility]- copay amounts may vary at other pharmacies due to pharmacy/plan contracts, or as the patient moves through the different stages of their insurance plan.     Morgan Arab, CPHT Pharmacy Technician III Certified Patient Advocate [facility] Patient Advocate Team Direct Number: [phone]  Fax: [phone]
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
## 2023-07-24 NOTE — Progress Notes (Signed)
 PROGRESS NOTE    INDIE SUCHANEK  ZOX:[id number] DOB: 1960/06/13 DOA: 07/22/2023 PCP: Abram Abraham, NP-C   Brief Narrative: 63 year old with past medical history significant for paroxysmal A-fib, hypertension, history of DVT and PE, anxiety, sickle cell trait, TBI subarachnoid hemorrhage presented to the ED complaining of chest pain and palpitation.  Patient was found to be in A-fib with RVR.  She was a started on Cardizem  drip.  Cardiology has been consulted.  Patient has been also complaining of headaches.     Assessment & Plan:   Principal Problem:   Paroxysmal atrial fibrillation (HCC) Active Problems:   Chronic migraine w/o aura w/o status migrainosus, not intractable   Bipolar affective disorder, current episode mixed (HCC)   History of DVT (deep vein thrombosis)   Benign essential hypertension   Atrial fibrillation with rapid ventricular response (HCC)   Chest pain   1-A-fib RVR: Paroxysmal Afib. -On Lovenox  and Coumadin . -still having chest pain on and off. -Cardiology planning initiation of amiodarone. -Possible TEE CV tomorrow if needed -on Cardizem   Chest pain: Cardiology following. Troponin negative.  Migraine headaches: CT head unremarkable. She has received Multiples doses Migraine cocktail. Report intolerance to Depakote Repeat Migraine  cocktail Neurology consulted. Recommend MRI   Hypertension: On cardizem   Bipolar disorder and anxiety disorder: Appreciate Psych eval. Ativan  change to TID PRN. Continue BuSpar , Lexapro , Lamictal , Seroquel , Caplyta   History of DVT PE: Continue Lovenox  and Coumadin      Estimated body mass index is 29.99 kg/m as calculated from the following:   Height as of this encounter: 5\' 3"  (1.6 m).   Weight as of this encounter: 76.8 kg.   DVT prophylaxis: Lovenox  Code Status: Full code Family Communication: Discussed with patient Disposition Plan:  Status is: Observation The patient remains OBS appropriate  and will d/c before 2 midnights.    Consultants:  Neurology Cardiology  Procedures:    Antimicrobials:    Subjective: She is still having some chest pain on and off, still having migraines, severe headache.  She would like to try morphine  or something else for the headache.  She cannot take Dilaudid  due to allergies.  Plan to do low-dose Percocet.  Neurology consulted for migraine.  Under a lot of stress  Objective: Vitals:   07/23/23 1600 07/23/23 2301 07/23/23 2342 07/24/23 0447  BP: (!) 149/86  132/69 120/81  Pulse:   69 66  Resp: 19  20 20   Temp:   98.1 F (36.7 C) 98.1 F (36.7 C)  TempSrc:   Other (Comment) Oral  SpO2:   100% 99%  Weight:  76.8 kg    Height:  5\' 3"  (1.6 m)     No intake or output data in the 24 hours ending 07/24/23 0722 Filed Weights   07/23/23 0301 07/23/23 2301  Weight: 77.5 kg 76.8 kg    Examination:  General exam: Appears calm and comfortable  Respiratory system: Clear to auscultation. Respiratory effort normal. Cardiovascular system: S1 & S2 heard, RRR.  Gastrointestinal system: Abdomen is nondistended, soft and nontender. No organomegaly or masses felt. Normal bowel sounds heard. Central nervous system: Alert and oriented. No focal neurological deficits. Extremities: Symmetric 5 x 5 power.    Data Reviewed: I have personally reviewed following labs and imaging studies  CBC: Recent Labs  Lab 07/22/23 2106 07/23/23 0528  WBC 11.3* 10.4  NEUTROABS  --  6.0  HGB 13.3 13.3  HCT 39.5 39.1  MCV 78.4* 77.9*  PLT 213 204   Basic  Metabolic Panel: Recent Labs  Lab 07/22/23 2106 07/23/23 0528  NA 137 139  K 3.7 3.5  CL 105 106  CO2 19* 23  GLUCOSE 105* 102*  BUN 14 13  CREATININE 1.01* 0.89  CALCIUM 9.2 9.1  MG  --  2.0   GFR: Estimated Creatinine Clearance: 66 mL/min (by C-G formula based on SCr of 0.89 mg/dL). Liver Function Tests: Recent Labs  Lab 07/23/23 0528  AST 23  ALT 17  ALKPHOS 56  BILITOT 0.5  PROT  7.1  ALBUMIN  3.9   No results for input(s): "LIPASE", "AMYLASE" in the last 168 hours. No results for input(s): "AMMONIA" in the last 168 hours. Coagulation Profile: Recent Labs  Lab 07/22/23 2106 07/23/23 0927 07/24/23 0457  INR 1.5* 1.6* 1.4*   Cardiac Enzymes: No results for input(s): "CKTOTAL", "CKMB", "CKMBINDEX", "TROPONINI" in the last 168 hours. BNP (last 3 results) Recent Labs    05/29/23 1642  PROBNP 66.0   HbA1C: No results for input(s): "HGBA1C" in the last 72 hours. CBG: Recent Labs  Lab 07/22/23 2133  GLUCAP 100*   Lipid Profile: No results for input(s): "CHOL", "HDL", "LDLCALC", "TRIG", "CHOLHDL", "LDLDIRECT" in the last 72 hours. Thyroid  Function Tests: Recent Labs    07/23/23 0529  TSH 3.945   Anemia Panel: No results for input(s): "VITAMINB12", "FOLATE", "FERRITIN", "TIBC", "IRON", "RETICCTPCT" in the last 72 hours. Sepsis Labs: No results for input(s): "PROCALCITON", "LATICACIDVEN" in the last 168 hours.  No results found for this or any previous visit (from the past 240 hours).       Radiology Studies: VAS US  LOWER EXTREMITY VENOUS (DVT) Result Date: 07/23/2023  Lower Venous DVT Study Patient Name:  JILLIANA SHACKLETT  Date of Exam:   07/23/2023 Medical Rec #: [id number]        Accession #:    [id number] Date of Birth: 02-26-1961       Patient Gender: F Patient Age:   68 years Exam Location:  [facility] Procedure:      VAS US  LOWER EXTREMITY VENOUS (DVT) Referring Phys: Melford Spotted --------------------------------------------------------------------------------  Indications: Edema.  Performing Technologist: Aloma Arrant RVS  Examination Guidelines: A complete evaluation includes B-mode imaging, spectral Doppler, color Doppler, and power Doppler as needed of all accessible portions of each vessel. Bilateral testing is considered an integral part of a complete examination. Limited examinations for reoccurring indications may be  performed as noted. The reflux portion of the exam is performed with the patient in reverse Trendelenburg.  +---------+---------------+---------+-----------+----------+--------------+ RIGHT    CompressibilityPhasicitySpontaneityPropertiesThrombus Aging +---------+---------------+---------+-----------+----------+--------------+ CFV      Full           Yes      Yes                                 +---------+---------------+---------+-----------+----------+--------------+ SFJ      Full                                                        +---------+---------------+---------+-----------+----------+--------------+ FV Prox  Full                                                        +---------+---------------+---------+-----------+----------+--------------+  FV Mid   Full                                                        +---------+---------------+---------+-----------+----------+--------------+ FV DistalFull                                                        +---------+---------------+---------+-----------+----------+--------------+ PFV      Full                                                        +---------+---------------+---------+-----------+----------+--------------+ POP      Full           Yes      Yes                                 +---------+---------------+---------+-----------+----------+--------------+ PTV      Full                                                        +---------+---------------+---------+-----------+----------+--------------+ PERO     Full                                                        +---------+---------------+---------+-----------+----------+--------------+   +---------+---------------+---------+-----------+----------+--------------+ LEFT     CompressibilityPhasicitySpontaneityPropertiesThrombus Aging +---------+---------------+---------+-----------+----------+--------------+ CFV      Full            Yes      Yes                                 +---------+---------------+---------+-----------+----------+--------------+ SFJ      Full                                                        +---------+---------------+---------+-----------+----------+--------------+ FV Prox  Full                                                        +---------+---------------+---------+-----------+----------+--------------+ FV Mid   Full                                                        +---------+---------------+---------+-----------+----------+--------------+  FV DistalFull                                                        +---------+---------------+---------+-----------+----------+--------------+ PFV      Full                                                        +---------+---------------+---------+-----------+----------+--------------+ POP      Full           Yes      Yes                                 +---------+---------------+---------+-----------+----------+--------------+ PTV      Full                                                        +---------+---------------+---------+-----------+----------+--------------+ PERO     Full                                                        +---------+---------------+---------+-----------+----------+--------------+     Summary: BILATERAL: - No evidence of deep vein thrombosis seen in the lower extremities, bilaterally. -No evidence of popliteal cyst, bilaterally.   *See table(s) above for measurements and observations. Electronically signed by Delaney Fearing on 07/23/2023 at 4:08:59 PM.    Final    ECHOCARDIOGRAM COMPLETE Result Date: 07/23/2023    ECHOCARDIOGRAM REPORT   Patient Name:   LOVEY GARI Date of Exam: 07/23/2023 Medical Rec #:  [id number]       Height:       63.0 in Accession #:    [id number]      Weight:       170.9 lb Date of Birth:  13-Jun-1960      BSA:          1.808 m Patient Age:     62 years        BP:           108/70 mmHg Patient Gender: F               HR:           70 bpm. Exam Location:  Inpatient Procedure: 2D Echo (Both Spectral and Color Flow Doppler were utilized during            procedure). Indications:    chest pain  History:        Patient has prior history of Echocardiogram examinations, most                 recent 03/08/2020. Arrythmias:Atrial Fibrillation; Risk                 Factors:Hypertension.  Sonographer:    [name] RDCS Referring Phys: [address] [facility]  IMPRESSIONS  1. Left ventricular ejection fraction, by estimation, is 55 to 60%. The left ventricle has normal function. The left ventricle has no regional wall motion abnormalities. Left ventricular diastolic parameters were normal.  2. Right ventricular systolic function is normal. The right ventricular size is normal. There is normal pulmonary artery systolic pressure.  3. The mitral valve is normal in structure. No evidence of mitral valve regurgitation. No evidence of mitral stenosis.  4. The aortic valve is tricuspid. Aortic valve regurgitation is not visualized. No aortic stenosis is present.  5. The inferior vena cava is normal in size with greater than 50% respiratory variability, suggesting right atrial pressure of 3 mmHg.  6. PACs and PVCs noted in study. Comparison(s): Unable to load 2021 study. FINDINGS  Left Ventricle: No Strain or 3D Transmitted. Left ventricular ejection fraction, by estimation, is 55 to 60%. The left ventricle has normal function. The left ventricle has no regional wall motion abnormalities. Strain was performed and the global longitudinal strain is indeterminate. The left ventricular internal cavity size was normal in size. There is no left ventricular hypertrophy. Left ventricular diastolic parameters were normal. Right Ventricle: The right ventricular size is normal. No increase in right ventricular wall thickness. Right ventricular systolic function is normal.  There is normal pulmonary artery systolic pressure. The tricuspid regurgitant velocity is 2.25 m/s, and  with an assumed right atrial pressure of 3 mmHg, the estimated right ventricular systolic pressure is 23.2 mmHg. Left Atrium: Left atrial size was normal in size. Right Atrium: Right atrial size was normal in size. Pericardium: There is no evidence of pericardial effusion. Mitral Valve: The mitral valve is normal in structure. No evidence of mitral valve regurgitation. No evidence of mitral valve stenosis. Tricuspid Valve: The tricuspid valve is normal in structure. Tricuspid valve regurgitation is mild . No evidence of tricuspid stenosis. Aortic Valve: The aortic valve is tricuspid. Aortic valve regurgitation is not visualized. No aortic stenosis is present. Pulmonic Valve: The pulmonic valve was normal in structure. Pulmonic valve regurgitation is trivial. No evidence of pulmonic stenosis. Aorta: The aortic root and ascending aorta are structurally normal, with no evidence of dilitation. Venous: The inferior vena cava is normal in size with greater than 50% respiratory variability, suggesting right atrial pressure of 3 mmHg. IAS/Shunts: No atrial level shunt detected by color flow Doppler. Additional Comments: 3D was performed not requiring image post processing on an independent workstation and was indeterminate.  LEFT VENTRICLE PLAX 2D LVIDd:         5.10 cm   Diastology LVIDs:         3.80 cm   LV e' medial:    9.79 cm/s LV PW:         0.70 cm   LV E/e' medial:  10.4 LV IVS:        0.80 cm   LV e' lateral:   12.40 cm/s LVOT diam:     1.60 cm   LV E/e' lateral: 8.2 LV SV:         42 LV SV Index:   23 LVOT Area:     2.01 cm  RIGHT VENTRICLE             IVC RV Basal diam:  2.80 cm     IVC diam: 1.70 cm RV S prime:     12.30 cm/s TAPSE (M-mode): 1.9 cm LEFT ATRIUM             Index  RIGHT ATRIUM           Index LA diam:        4.00 cm 2.21 cm/m   RA Area:     14.70 cm LA Vol (A2C):   46.3 ml 25.60 ml/m   RA Volume:   38.80 ml  21.45 ml/m LA Vol (A4C):   45.2 ml 24.99 ml/m LA Biplane Vol: 49.3 ml 27.26 ml/m  AORTIC VALVE LVOT Vmax:   102.00 cm/s LVOT Vmean:  64.900 cm/s LVOT VTI:    0.207 m  AORTA Ao Asc diam: 3.20 cm MITRAL VALVE                TRICUSPID VALVE MV Area (PHT): 4.89 cm     TR Peak grad:   20.2 mmHg MV Decel Time: 155 msec     TR Vmax:        225.00 cm/s MV E velocity: 102.00 cm/s MV A velocity: 75.10 cm/s   SHUNTS MV E/A ratio:  1.36         Systemic VTI:  0.21 m                             Systemic Diam: 1.60 cm Gloriann Larger MD Electronically signed by Gloriann Larger MD Signature Date/Time: 07/23/2023/11:26:36 AM    Final    CT HEAD WO CONTRAST ( ) Result Date: 07/23/2023 CLINICAL DATA:  63 year old female with sudden onset chest pain, shortness of breath. Increased headache. Atrial fibrillation with RVR. EXAM: CT HEAD WITHOUT CONTRAST TECHNIQUE: Contiguous axial images were obtained from the base of the skull through the vertex without intravenous contrast. RADIATION DOSE REDUCTION: This exam was performed according to the departmental dose-optimization program which includes automated exposure control, adjustment of the mA and/or kV according to patient size and/or use of iterative reconstruction technique. COMPARISON:  Brain MRI and head CT 05/19/2022 FINDINGS: Brain: Cerebral volume is stable from last year, normal for age. No midline shift, ventriculomegaly, mass effect, evidence of mass lesion, intracranial hemorrhage or evidence of cortically based acute infarction. Mild for age cerebral white matter heterogeneity. Stable gray-white matter differentiation throughout the brain. Vascular: Calcified atherosclerosis at the skull base. No suspicious intracranial vascular hyperdensity. Skull: Stable and intact. Sinuses/Orbits: Visualized paranasal sinuses and mastoids are stable and well aerated. Other: Visualized orbits and scalp soft tissues are within normal limits.  IMPRESSION: Stable since last year and largely negative for age non contrast CT appearance of the brain. Electronically Signed   By: Marlise Simpers M.D.   On: 07/23/2023 04:44   DG Chest Portable 1 View Result Date: 07/22/2023 CLINICAL DATA:  Chest pain and shortness of breath EXAM: PORTABLE CHEST 1 VIEW COMPARISON:  12/06/2022 FINDINGS: Cardiac shadow is within normal limits. Lungs are clear bilaterally. Postsurgical changes in the right humerus are seen. IMPRESSION: No active disease. Electronically Signed   By: Violeta Grey M.D.   On: 07/22/2023 22:03        Scheduled Meds:  acetaminophen   650 mg Oral Once   busPIRone   30 mg Oral BID   carbidopa -levodopa   1 tablet Oral TID   diltiazem   360 mg Oral Daily   enoxaparin  (LOVENOX ) injection  120 mg Subcutaneous Q24H   escitalopram   10 mg Oral q morning   fluticasone  2 spray Each Nare Daily   lamoTRIgine   200 mg Oral BID   loratadine  10 mg Oral Daily   LORazepam   0.5 mg Oral  BID   lumateperone  tosylate  42 mg Oral Daily   QUEtiapine   50 mg Oral QHS   warfarin  10 mg Oral ONCE-1600   Warfarin - Pharmacist Dosing Inpatient   Does not apply q1600   Continuous Infusions:   LOS: 0 days    Time spent: 35 minutes.    Danette Duos, MD [facility]   If 7PM-7AM, please contact night-coverage [email or web address]  07/24/2023, 7:22 AM

## 2023-07-24 NOTE — Progress Notes (Signed)
 PHARMACY - ANTICOAGULATION CONSULT NOTE  Pharmacy Consult for warfarin and enoxaparin   Indication: atrial fibrillation, hx DVT/PE  Allergies  Allergen Reactions   Hydromorphone  Other (See Comments)    Stroke-like symptoms. treated with Narcan; Tolerated Percocet in the past.   Iodinated Contrast Media Other (See Comments)    Reaction not fully known   Prochlorperazine Swelling and Other (See Comments)    Compazine - tongue swelling    Patient Measurements: Height: 5\' 3"  (160 cm) Weight: 76.8 kg (169 lb 4.8 oz) IBW/kg (Calculated) : 52.4 HEPARIN DW (KG): 68.9  Vital Signs: Temp: 98.1 F (36.7 C) (05/14 0447) Temp Source: Oral (05/14 0447) BP: 120/81 (05/14 0447) Pulse Rate: 66 (05/14 0447)  Labs: Recent Labs    07/22/23 2106 07/22/23 2322 07/23/23 0528 07/23/23 0927 07/24/23 0457  HGB 13.3  --  13.3  --   --   HCT 39.5  --  39.1  --   --   PLT 213  --  204  --   --   LABPROT 18.1*  --   --  19.0* 17.7*  INR 1.5*  --   --  1.6* 1.4*  CREATININE 1.01*  --  0.89  --   --   TROPONINIHS 6 5  --   --   --     Estimated Creatinine Clearance: 66 mL/min (by C-G formula based on SCr of 0.89 mg/dL).   Medical History: Past Medical History:  Diagnosis Date   A-fib [facility])    Allergy    Anxiety    Bipolar disorder (HCC)    Chondromalacia patellae 12/14/2003   Depression    DVT (deep vein thrombosis) in pregnancy    DVT (deep venous thrombosis) (HCC) 2001 and 2002   DVT (deep venous thrombosis) (HCC)    x 2   Enlarged pancreas 07/02/2023   Fulness of pancreatic head on CT    Esophagitis 06/06/2016   GERD (gastroesophageal reflux disease)    Hemorrhage    Hyperlipidemia    Migraines    Pulmonary embolism (HCC) 2001   S/P cholecystectomy 09/11/2012   Sickle cell anemia (HCC)    Suicidal ideation 12/28/2008   Tremor    Assessment: Emily Rice is a 63 y.o. year old female presented on 07/22/2023 with concern for afib RVR. On warfarin prior to admission for  afib, hx DVT and PE . Previously switched to eliquis  earlier this year, but developed new DVT. Prior to admission regimen, 5mg  MTWThFS and 7.5mg  Sun (total weekly dose 37.5mg ).   INR subtherapeutic, started on lovenox  bridge on admission -INR= 1.4 -warfarin 10mg  ordered 5/13 but she missed her dose 5/13  Goal of Therapy:  INR goal 2-3 Monitor platelets by anticoagulation protocol: Yes   Plan:  Warfarin 10mg  PO x 1 today Daily INR Continue Lovenox  120mg  sq q24h  Baxter Limber, PharmD Clinical Pharmacist **Pharmacist phone directory can now be found on [email or web address] ([email or web address]).  Listed under [facility].

## 2023-07-24 NOTE — Consult Note (Signed)
 Neurology Consultation Reason for Consult: headache Referring Physician: Dr Catharine Clock  CC: headache  History is obtained from: patient, chart review  HPI: Emily Rice is a 63 y.o. female with h/o A fib on coumadin  ( failed eliquis ), bipolar disorder, anxiety, who was admitted on  07/22/2023 for chest pain and shortness of breath. She was noted to be in A fib with HR 130-140. Seen by cardiology and coumadin  dose was adjusted. Planning for TEE/DCCV tomorrow. While inpatient patient reported she has been having headache since last Wednesday. Hospitalist spoke with overnight neurologist and patient received toradol , reglan , excedrin was administered. Neurology consulted today fro further recommendations.  Patient states she has had chronic migraine for a long time and was on medications while in [place]. After reportedly failing multiple pharmacologic interventions, Botox was recommended.  However around 2020 she moved to [facility] and was seen [facility] medicine. She states she was prescribed some rescue medication that ws not covered by her insurance but she got some samples from the physician which helped. Eventually her migraines spontaneously resolved. And she thought she wont have them ever again until last wednesday . States she noticed dizziness as if room is spinning and she will pass out if she stood up. This was followed by stabbing severe right sided headache associated with nausea. She took tylenol  which didn't help. Over next few days she missed appt with EP cards due to migraine and eventually ended up in ER with chest pain and SOB.   States toradol  and reglan  have helped but headache hasn't resolved. Reports depakote causes tremors in past so doesn't wish to use it. States imitrex /triptans dont help so doesn't wish to use it. Reports allergies to compazine and phenergan described as jerking and movements ( tardive dyskinesia).  Denies any weakness, numbness, speech disturbance,  vision issues, focal neurological deficits  ROS: All other systems reviewed and negative except as noted in the HPI.   Past Medical History:  Diagnosis Date   A-fib [facility])    Allergy    Anxiety    Bipolar disorder (HCC)    Chondromalacia patellae 12/14/2003   Depression    DVT (deep vein thrombosis) in pregnancy    DVT (deep venous thrombosis) (HCC) 2001 and 2002   DVT (deep venous thrombosis) (HCC)    x 2   Enlarged pancreas 07/02/2023   Fulness of pancreatic head on CT    Esophagitis 06/06/2016   GERD (gastroesophageal reflux disease)    Hemorrhage    Hyperlipidemia    Migraines    Pulmonary embolism (HCC) 2001   S/P cholecystectomy 09/11/2012   Sickle cell anemia (HCC)    Suicidal ideation 12/28/2008   Tremor     Family History  Problem Relation Age of Onset   Hypertension Mother    Stroke Father    Hypertension Father    Atrial fibrillation Father    Heart disease Father    Diabetes Sister    CAD Brother    Anxiety disorder Daughter    ADD / ADHD Son    Depression Son    Hypertension Maternal Uncle    Liver disease Neg Hx    Esophageal cancer Neg Hx    Colon cancer Neg Hx     Social History:  reports that she has never smoked. She has never used smokeless tobacco. She reports that she does not currently use drugs after having used the following drugs: Benzodiazepines. Frequency: 7.00 times per week. She reports that she does not  drink alcohol.   Medications Prior to Admission  Medication Sig Dispense Refill Last Dose/Taking   aspirin-acetaminophen -caffeine (EXCEDRIN MIGRAINE) 250-250-65 MG tablet Take 2 tablets by mouth 2 (two) times daily as needed for migraine.   07/22/2023 at 11:00 AM   busPIRone  (BUSPAR ) 30 MG tablet Take 1 tablet (30 mg total) by mouth 2 (two) times daily. 60 tablet 1 07/22/2023 Morning   CAPLYTA  42 MG capsule TAKE 1 CAPSULE EVERY DAY 30 capsule 11 07/21/2023   carbidopa -levodopa  (SINEMET  IR) 25-100 MG tablet Take one tablet in the am and  one tablet in the pm (Patient taking differently: Take 1 tablet by mouth 3 (three) times daily. Take one tablet in the am and one tablet in the pm) 60 tablet 4 07/22/2023 at  1:30 PM   cetirizine (ZYRTEC) 10 MG tablet Take 10 mg by mouth daily as needed for allergies.   Past Week   Cholecalciferol  75 MCG (3000 UT) TABS Take 3,000 Units by mouth daily. 30 tablet 0 07/22/2023 Morning   diltiazem  (CARDIZEM  CD) 360 MG 24 hr capsule Take 1 capsule (360 mg total) by mouth every morning. 30 capsule 5 07/22/2023 Morning   diphenhydrAMINE  (BENADRYL  ALLERGY EXTRA STR) 50 MG tablet Take 1 tablet (50 mg total) by mouth once for 1 dose. Take 1 tablet 1 hour prior to CT scan. 1 tablet 0 Unknown   escitalopram  (LEXAPRO ) 10 MG tablet TAKE 1 TABLET AT BEDTIME (Patient taking differently: Take 10 mg by mouth every morning.) 90 tablet 3 07/22/2023 Morning   fexofenadine (ALLEGRA) 180 MG tablet Take 180 mg by mouth as needed for allergies or rhinitis.   Past Week   fluticasone (FLONASE) 50 MCG/ACT nasal spray Place 1 spray into both nostrils daily as needed for allergies.    07/21/2023   lamoTRIgine  (LAMICTAL ) 200 MG tablet TAKE 1 TABLET TWICE DAILY 180 tablet 0 07/22/2023 Morning   LORazepam  (ATIVAN ) 0.5 MG tablet Take 1 tablet (0.5 mg total) by mouth 2 (two) times daily. 60 tablet 0 07/22/2023 Morning   metFORMIN  (GLUCOPHAGE -XR) 500 MG 24 hr tablet Take 2 tablets (1,000 mg total) by mouth 2 (two) times daily with a meal. 120 tablet 1 07/22/2023 Morning   Multiple Vitamin (MULTIVITAMIN WITH MINERALS) TABS tablet Take 1 tablet by mouth daily. (Patient taking differently: Take 1 tablet by mouth daily. Centrum 50+)   07/22/2023 Morning   polyethylene glycol (MIRALAX  / GLYCOLAX ) 17 g packet Take 17 g by mouth 2 (two) times daily. (Patient taking differently: Take 17 g by mouth daily as needed for mild constipation.) 14 each 0 Past Month   QUEtiapine  (SEROQUEL ) 50 MG tablet TAKE 1 TABLET AT BEDTIME 90 tablet 1 07/21/2023   warfarin  (COUMADIN ) 5 MG tablet Take 5-7.5 mg by mouth See admin instructions. Take 1 tablet by mouth daily, then take 1 and 1/2 tablet every Sunday   07/22/2023 at  9:00 PM   acetaminophen  (TYLENOL ) 325 MG tablet Take 2 tablets (650 mg total) by mouth every 6 (six) hours as needed for mild pain or moderate pain. (Patient not taking: Reported on 07/23/2023)   Not Taking   lumateperone  tosylate (CAPLYTA ) 42 MG capsule Take 1 capsule (42 mg total) by mouth daily. 30 capsule 0       Exam: Current vital signs: BP (!) 140/88 (BP Location: Left Arm)   Pulse 97   Temp 98.5 F (36.9 C) (Oral)   Resp 15   Ht 5\' 3"  (1.6 m)   Wt 76.8 kg  LMP  (LMP Unknown)   SpO2 (P) 100%   BMI 29.99 kg/m  Vital signs in last 24 hours: Temp:  [98 F (36.7 C)-98.5 F (36.9 C)] 98.5 F (36.9 C) (05/14 1209) Pulse Rate:  [66-97] 97 (05/14 1209) Resp:  [15-20] 15 (05/14 1209) BP: (120-149)/(69-106) 140/88 (05/14 1209) SpO2:  [99 %-100 %] (P) 100 % (05/14 1209) Weight:  [76.8 kg] 76.8 kg (05/13 2301)   Physical Exam  Constitutional: Appears well-developed and well-nourished.  Psych: Affect appropriate to situation Neuro: AOX3, no aphasia, CN grossly intact, 5/5 in all extremities. FTN intact BL, sensory intact to LT  I have reviewed labs in epic and the results pertinent to this consultation are: CBC:  Recent Labs  Lab 07/22/23 2106 07/23/23 0528  WBC 11.3* 10.4  NEUTROABS  --  6.0  HGB 13.3 13.3  HCT 39.5 39.1  MCV 78.4* 77.9*  PLT 213 204    Basic Metabolic Panel:  Lab Results  Component Value Date   NA 140 07/24/2023   K 3.9 07/24/2023   CO2 23 07/24/2023   GLUCOSE 93 07/24/2023   BUN 11 07/24/2023   CREATININE 0.97 07/24/2023   CALCIUM 8.8 (L) 07/24/2023   GFRNONAA >60 07/24/2023   GFRAA >60 12/10/2019   Lipid Panel:  Lab Results  Component Value Date   LDLCALC 86 02/28/2023   HgbA1c:  Lab Results  Component Value Date   HGBA1C 5.8 02/28/2023   Urine Drug Screen: No results found  for: "LABOPIA", "COCAINSCRNUR", "LABBENZ", "AMPHETMU", "THCU", "LABBARB"  Alcohol Level     Component Value Date/Time   ETH <10 05/19/2022 1444     I have reviewed the images obtained:  CT Head without contrast 07/23/2023: Stable since last year and largely negative for age non contrast CT appearance of the brain.   ASSESSMENT/PLAN: 62yo f admitted for chest pain and sob due to A fib. Also reports headache since one week   Status migrainous - headache likely secondary to status migrainosus. Could also be due to stroke or small bleed as patient is on anti-coagulant although less likely as no focal neurological deficits  Recommendations: - Patient states she doesn't want to take vpa, compazine,phenergan and triptans due to reasons listed in HPI - Due to patient in A fib currently, would hold off on using DHE - Already on opioid pain meds - Therefore, discussed with patient to cautiously use excedrin without asa Q12h prn for headache over next 2-3 days until cardiology is assessing his Afib - Will aslo get mri brain wo contrast to look for any acute abnormality - if negative, recommend f/u with Dr Gracie Lav at [facility] after discharge - Discussed plan with patient and husband at bedside, Agree with plan - Update Dr Landrum Pink via secure chat   Thank you for allowing us  to participate in the care of this patient. If you have any further questions, please contact  me or neurohospitalist.   Roxy Cordial Epilepsy [facility]

## 2023-07-24 NOTE — Progress Notes (Addendum)
 Patient Name: Emily Rice Date of Encounter: 07/24/2023 [facility] Cardiologist: Olinda Bertrand, DO   Interval Summary  .    Continues to have chest discomfort, migraines. Back in afib this morning.   Vital Signs .    Vitals:   07/23/23 1600 07/23/23 2301 07/23/23 2342 07/24/23 0447  BP: (!) 149/86  132/69 120/81  Pulse:   69 66  Resp: 19  20 20   Temp:   98.1 F (36.7 C) 98.1 F (36.7 C)  TempSrc:   Other (Comment) Oral  SpO2:   100% 99%  Weight:  76.8 kg    Height:  5\' 3"  (1.6 m)     No intake or output data in the 24 hours ending 07/24/23 0750    07/23/2023   11:01 PM 07/23/2023    3:01 AM 07/11/2023    1:17 PM  Last 3 Weights  Weight (lbs) 169 lb 4.8 oz 170 lb 13.7 oz 171 lb  Weight (kg) 76.794 kg 77.5 kg 77.565 kg      Telemetry/ECG    Atrial fibrillation, rates 70-90s - Personally Reviewed  Physical Exam .    GEN: No acute distress.   Neck: No JVD Cardiac: Irreg Irreg, no murmurs, rubs, or gallops.  Respiratory: Clear to auscultation bilaterally. GI: Soft, nontender, non-distended  MS: No edema  Assessment & Plan .     63 y.o. female with a hx of AF (on warfarin), HTN, TBI/SAH, PE, DVT (2002), bipolar disorder, anxiety, sickle cell trait who was seen 07/23/2023 for the evaluation of Afib and chest pain at the request of the EDP.   Paroxysmal Atrial fibrillation with RVR Chest pain -- known hx of atrial fibrillation initially dx in 2014. Presented with chest pain and found to be in afib RVR. While in the ED was planned for IV Diltiazem  but converted to sinus rhythm -- hsTn negative 6>>5, noted to be tender to palpation on exam. Suspect MSK pain.  -- has been resumed on Diltiazem , continued on coumadin  (per pharmD) w/lovenox  bridging  -- back into atrial fibrillation this morning, chest pain seems to be worse with afib -- has been following with EP as an outpatient, wants to pursue an ablation (may need to consider AAD to bridge to ablation) --  will tentatively hold a spot for TEE/DCCV tomorrow if she remains in atrial fibrillation  Informed Consent   Shared Decision Making/Informed Consent{ The risks [stroke, cardiac arrhythmias rarely resulting in the need for a temporary or permanent pacemaker, skin irritation or burns, esophageal damage, perforation (1:10,000 risk), bleeding, pharyngeal hematoma as well as other potential complications associated with conscious sedation including aspiration, arrhythmia, respiratory failure and death], benefits (treatment guidance, restoration of normal sinus rhythm, diagnostic support) and alternatives of a transesophageal echocardiogram guided cardioversion were discussed in detail with Emily Rice and she is willing to proceed.    Hx of recurrent DVT/PE -- previously developed DVT while on Eliquis  and has been a coumadin  since that time -- on coumadin , but subtherapeutic INR at 1.4, pharmD to dose  Headache/Hx of migraines -- negative head CT, has received several rounds of medications without improvement -- per primary  For questions or updates, please contact [facility] Please consult [email or web address] for contact info under        Signed, Johnie Nailer, NP    ATTENDING ATTESTATION:  After conducting a review of all available clinical information with the care team, interviewing the patient, and performing a physical exam, I agree  with the findings and plan described in this note.   GEN: No acute distress.   HEENT:  MMM, no JVD, no scleral icterus Cardiac: RRR, no murmurs, rubs, or gallops.  Respiratory: Clear to auscultation bilaterally. GI: Soft, nontender, non-distended  MS: No edema; No deformity. Neuro:  Nonfocal  Vasc:  +2 radial pulses  Patient's chest pain seems improved.  In NSR on my exam but during exam, reverts between NSR and AF.  Patient very sensitive to being in AF.  Will start IV amiodarone today with planned transition to PO (with or without TEE CV in  AM).  NPO at [facility] for possible TEE CV tomorrow if needed.  Patient agrees with plan.  Alyssa Backbone, MD Pager [phone]

## 2023-07-25 ENCOUNTER — Other Ambulatory Visit (HOSPITAL_COMMUNITY)

## 2023-07-25 ENCOUNTER — Other Ambulatory Visit: Payer: Self-pay | Admitting: Adult Health

## 2023-07-25 ENCOUNTER — Encounter (HOSPITAL_COMMUNITY)
Admission: EM | Disposition: A | Discharge status: Home / Self Care | Payer: Self-pay | Source: Home / Self Care | Attending: Internal Medicine

## 2023-07-25 DIAGNOSIS — I480 Paroxysmal atrial fibrillation: Secondary | ICD-10-CM | POA: Diagnosis not present

## 2023-07-25 DIAGNOSIS — F310 Bipolar disorder, current episode hypomanic: Secondary | ICD-10-CM

## 2023-07-25 LAB — LIPID PANEL
Cholesterol: 132 mg/dL (ref 0–200)
HDL: 56 mg/dL (ref >40)
LDL Cholesterol: 61 mg/dL (ref 0–99)
Total CHOL/HDL Ratio: 2.4 ratio
Triglycerides: 76 mg/dL (ref <150)
VLDL: 15 mg/dL (ref 0–40)

## 2023-07-25 LAB — CBC
HCT: 38 % (ref 36.0–46.0)
Hemoglobin: 12.3 g/dL (ref 12.0–15.0)
MCH: 25.8 pg — ABNORMAL LOW (ref 26.0–34.0)
MCHC: 32.4 g/dL (ref 30.0–36.0)
MCV: 79.8 fL — ABNORMAL LOW (ref 80.0–100.0)
Platelets: 192 10*3/uL (ref 150–400)
RBC: 4.76 MIL/uL (ref 3.87–5.11)
RDW: 15.8 % — ABNORMAL HIGH (ref 11.5–15.5)
WBC: 8.6 10*3/uL (ref 4.0–10.5)
nRBC: 0 % (ref 0.0–0.2)

## 2023-07-25 LAB — PROTIME-INR
INR: 1.5 — ABNORMAL HIGH (ref 0.8–1.2)
Prothrombin Time: 18.7 s — ABNORMAL HIGH (ref 11.4–15.2)

## 2023-07-25 LAB — ALBUMIN: Albumin: 3.7 g/dL (ref 3.5–5.0)

## 2023-07-25 LAB — BASIC METABOLIC PANEL WITH GFR
Anion gap: 7 (ref 5–15)
BUN: 13 mg/dL (ref 6–20)
CO2: 26 mmol/L (ref 22–32)
Calcium: 8.8 mg/dL — ABNORMAL LOW (ref 8.9–10.3)
Chloride: 106 mmol/L (ref 98–111)
Creatinine, Ser: 0.96 mg/dL (ref 0.44–1.00)
GFR, Estimated: >60 mL/min (ref >60)
Glucose, Bld: 87 mg/dL (ref 70–99)
Potassium: 4.3 mmol/L (ref 3.5–5.1)
Sodium: 139 mmol/L (ref 135–145)

## 2023-07-25 LAB — HEMOGLOBIN A1C
Hgb A1c MFr Bld: 5.5 % (ref 4.8–5.6)
Mean Plasma Glucose: 111.15 mg/dL

## 2023-07-25 LAB — TROPONIN I (HIGH SENSITIVITY)
Troponin I (High Sensitivity): 4 ng/L (ref <18)
Troponin I (High Sensitivity): 4 ng/L (ref <18)

## 2023-07-25 SURGERY — TRANSESOPHAGEAL ECHOCARDIOGRAM (TEE) (CATHLAB)
Anesthesia: Monitor Anesthesia Care

## 2023-07-25 MED ORDER — CARMEX CLASSIC LIP BALM EX OINT
TOPICAL_OINTMENT | CUTANEOUS | Status: DC | PRN
  Filled 2023-07-25: qty 10

## 2023-07-25 MED ORDER — PANTOPRAZOLE SODIUM 40 MG IV SOLR
40.0000 mg | Freq: Two times a day (BID) | INTRAVENOUS | Status: DC
  Administered 2023-07-25: 40 mg via INTRAVENOUS
  Filled 2023-07-25: qty 10

## 2023-07-25 MED ORDER — BUTALBITAL-APAP-CAFFEINE 50-325-40 MG PO TABS
1.0000 | ORAL_TABLET | Freq: Four times a day (QID) | ORAL | Status: DC | PRN
  Administered 2023-07-25: 1 via ORAL
  Filled 2023-07-25 (×2): qty 1

## 2023-07-25 MED ORDER — ACETAMINOPHEN 325 MG PO TABS
650.0000 mg | ORAL_TABLET | Freq: Four times a day (QID) | ORAL | Status: DC | PRN
Start: 2023-07-25 — End: 2023-07-27

## 2023-07-25 MED ORDER — AMIODARONE HCL 200 MG PO TABS
200.0000 mg | ORAL_TABLET | Freq: Two times a day (BID) | ORAL | Status: DC
  Administered 2023-07-25 – 2023-07-26 (×3): 200 mg via ORAL
  Filled 2023-07-25 (×3): qty 1

## 2023-07-25 MED ORDER — CALCIUM CARBONATE 1250 (500 CA) MG PO TABS
1.0000 | ORAL_TABLET | Freq: Two times a day (BID) | ORAL | Status: DC
  Administered 2023-07-25 – 2023-07-26 (×3): 1250 mg via ORAL
  Filled 2023-07-25 (×3): qty 1

## 2023-07-25 MED ORDER — ASPIRIN-ACETAMINOPHEN-CAFFEINE 250-250-65 MG PO TABS
2.0000 | ORAL_TABLET | Freq: Two times a day (BID) | ORAL | Status: DC | PRN
  Filled 2023-07-25: qty 2

## 2023-07-25 MED ORDER — MORPHINE SULFATE (PF) 2 MG/ML IV SOLN
2.0000 mg | INTRAVENOUS | Status: DC | PRN
  Filled 2023-07-25: qty 1

## 2023-07-25 MED ORDER — WARFARIN SODIUM 5 MG PO TABS
10.0000 mg | ORAL_TABLET | Freq: Once | ORAL | Status: AC
Start: 2023-07-25 — End: 2023-07-25
  Administered 2023-07-25: 10 mg via ORAL
  Filled 2023-07-25: qty 2

## 2023-07-25 MED ORDER — ASPIRIN 325 MG PO TBEC
325.0000 mg | DELAYED_RELEASE_TABLET | Freq: Once | ORAL | Status: AC
  Administered 2023-07-25: 325 mg via ORAL
  Filled 2023-07-25: qty 1

## 2023-07-25 MED ORDER — DILTIAZEM HCL ER COATED BEADS 180 MG PO CP24
180.0000 mg | ORAL_CAPSULE | Freq: Every day | ORAL | Status: DC
  Administered 2023-07-25: 180 mg via ORAL

## 2023-07-25 NOTE — Progress Notes (Signed)
 PROGRESS NOTE    Emily Rice  EAV:[id number] DOB: September 21, 1960 DOA: 07/22/2023 PCP: Abram Abraham, NP-C   Brief Narrative: 63 year old with past medical history significant for paroxysmal A-fib, hypertension, history of DVT and PE, anxiety, sickle cell trait, TBI subarachnoid hemorrhage presented to the ED complaining of chest pain and palpitation.  Patient was found to be in A-fib with RVR.  She was a started on Cardizem  drip.  Cardiology has been consulted.  Patient has been also complaining of headaches.     Assessment & Plan:   Principal Problem:   Paroxysmal atrial fibrillation (HCC) Active Problems:   Chronic migraine w/o aura w/o status migrainosus, not intractable   Bipolar affective disorder, current episode mixed (HCC)   History of DVT (deep vein thrombosis)   Benign essential hypertension   Atrial fibrillation with rapid ventricular response (HCC)   Chest pain   1-A-fib RVR: Paroxysmal Afib. -On Lovenox  and Coumadin . -Started on IV  amiodarone. Transition to oral today  -No need for cardioversion. -Bradycardia overnight Cardizem , dose reduce to 180 mg  May be discharge home per cardio.   Chest pain: Cardiology following. Troponin negative.  Migraine headaches: CT head unremarkable. She has received Multiples doses Migraine cocktail. Report intolerance to Depakote Repeat Migraine  cocktail Neurology consulted. Recommend MRI which was negative Continue with excedrin  Hypertension: On cardizem   Bipolar disorder and anxiety disorder: Appreciate Psych eval. Ativan  change to TID PRN. Continue BuSpar , Lexapro , Lamictal , Seroquel , Caplyta   History of DVT PE: Continue Lovenox  and Coumadin   Increase Urgency: UA with 6-10 WBC> check Urine culture.  Borde line low calcium: check albumin .    Estimated body mass index is 29.99 kg/m as calculated from the following:   Height as of this encounter: 5\' 3"  (1.6 m).   Weight as of this encounter: 76.8  kg.   DVT prophylaxis: Lovenox  Code Status: Full code Family Communication: Discussed with patient Disposition Plan:  Status is: Observation The patient remains OBS appropriate and will d/c before 2 midnights.    Consultants:  Neurology Cardiology  Procedures:    Antimicrobials:    Subjective: Report increase urinary urgency since yesterday.  She is worry about episode of bradycardia last night. Cardiology gave reassurance.  Still having issues with severe headaches.   Objective: Vitals:   07/24/23 1935 07/24/23 2312 07/25/23 0500 07/25/23 0914  BP: 133/80 133/77 121/72 129/78  Pulse: (!) 58 (!) 44    Resp: 18  18 18   Temp: 97.9 F (36.6 C) 98.3 F (36.8 C) 97.9 F (36.6 C) 97.7 F (36.5 C)  TempSrc: Oral Oral Oral Oral  SpO2: 97% 93%    Weight:      Height:        Intake/Output Summary (Last 24 hours) at 07/25/2023 1313 Last data filed at 07/24/2023 1500 Gross per 24 hour  Intake 85.3 ml  Output --  Net 85.3 ml   Filed Weights   07/23/23 0301 07/23/23 2301  Weight: 77.5 kg 76.8 kg    Examination:  General exam: NAD Respiratory system:CTA Cardiovascular system: S 1, S 2 RRR Gastrointestinal system: Bs present, soft,. nt Central nervous system: alert Extremities: Symmetric 5 x 5 power.    Data Reviewed: I have personally reviewed following labs and imaging studies  CBC: Recent Labs  Lab 07/22/23 2106 07/23/23 0528 07/25/23 0502  WBC 11.3* 10.4 8.6  NEUTROABS  --  6.0  --   HGB 13.3 13.3 12.3  HCT 39.5 39.1 38.0  MCV 78.4* 77.9*  79.8*  PLT 213 204 192   Basic Metabolic Panel: Recent Labs  Lab 07/22/23 2106 07/23/23 0528 07/24/23 0826 07/25/23 0502  NA 137 139 140 139  K 3.7 3.5 3.9 4.3  CL 105 106 107 106  CO2 19* 23 23 26   GLUCOSE 105* 102* 93 87  BUN 14 13 11 13   CREATININE 1.01* 0.89 0.97 0.96  CALCIUM 9.2 9.1 8.8* 8.8*  MG  --  2.0  --   --    GFR: Estimated Creatinine Clearance: 61.2 mL/min (by C-G formula based on  SCr of 0.96 mg/dL). Liver Function Tests: Recent Labs  Lab 07/23/23 0528  AST 23  ALT 17  ALKPHOS 56  BILITOT 0.5  PROT 7.1  ALBUMIN  3.9   No results for input(s): "LIPASE", "AMYLASE" in the last 168 hours. No results for input(s): "AMMONIA" in the last 168 hours. Coagulation Profile: Recent Labs  Lab 07/22/23 2106 07/23/23 0927 07/24/23 0457 07/25/23 0502  INR 1.5* 1.6* 1.4* 1.5*   Cardiac Enzymes: Recent Labs  Lab 07/24/23 1241  CKTOTAL 206   BNP (last 3 results) Recent Labs    05/29/23 1642  PROBNP 66.0   HbA1C: Recent Labs    07/25/23 0502  HGBA1C 5.5   CBG: Recent Labs  Lab 07/22/23 2133  GLUCAP 100*   Lipid Profile: Recent Labs    07/25/23 0502  CHOL 132  HDL 56  LDLCALC 61  TRIG 76  CHOLHDL 2.4   Thyroid  Function Tests: Recent Labs    07/23/23 0529  TSH 3.945   Anemia Panel: No results for input(s): "VITAMINB12", "FOLATE", "FERRITIN", "TIBC", "IRON", "RETICCTPCT" in the last 72 hours. Sepsis Labs: No results for input(s): "PROCALCITON", "LATICACIDVEN" in the last 168 hours.  No results found for this or any previous visit (from the past 240 hours).       Radiology Studies: MR BRAIN WO CONTRAST Result Date: 07/24/2023 CLINICAL DATA:  Headache, increasing frequency or severity. EXAM: MRI HEAD WITHOUT CONTRAST TECHNIQUE: Multiplanar, multiecho pulse sequences of the brain and surrounding structures were obtained without intravenous contrast. COMPARISON:  CT head 07/23/2023. FINDINGS: Brain: No acute infarct. No evidence of intracranial hemorrhage. Nonspecific scattered T2/FLAIR hyperintensity in the periventricular and subcortical white matter. Chronic microhemorrhage in the parasagittal left parietal lobe. No edema, mass effect, or midline shift. Posterior fossa is unremarkable. Normal appearance of midline structures. The basilar cisterns are patent. No extra-axial fluid collections. Ventricles: Normal size and configuration of the  ventricles. Vascular: Skull base flow voids are visualized. Skull and upper cervical spine: No focal abnormality. Sinuses/Orbits: Orbits are symmetric. Paranasal sinuses are clear. Other: Mastoid air cells are clear. IMPRESSION: No acute intracranial abnormality. White matter signal abnormality suggestive of mild chronic microvascular ischemic changes. Electronically Signed   By: Denny Flack M.D.   On: 07/24/2023 17:48        Scheduled Meds:  amiodarone  200 mg Oral BID   busPIRone   30 mg Oral BID   carbidopa -levodopa   1 tablet Oral TID   diltiazem   180 mg Oral Daily   enoxaparin  (LOVENOX ) injection  120 mg Subcutaneous Q24H   escitalopram   10 mg Oral q morning   fluticasone  2 spray Each Nare Daily   lamoTRIgine   200 mg Oral BID   loratadine  10 mg Oral Daily   LORazepam   0.5 mg Oral TID   lumateperone  tosylate  42 mg Oral QHS   QUEtiapine   50 mg Oral QHS   warfarin  10 mg Oral  ONCE-1600   Warfarin - Pharmacist Dosing Inpatient   Does not apply q1600   Continuous Infusions:   LOS: 1 day    Time spent: 35 minutes.    Danette Duos, MD [facility]   If 7PM-7AM, please contact night-coverage [email or web address]  07/25/2023, 1:13 PM

## 2023-07-25 NOTE — Progress Notes (Addendum)
 Patient Name: SETAREH LOIACONO Date of Encounter: 07/25/2023 [facility] Cardiologist: Olinda Bertrand, DO   Interval Summary  .    Feeling much better this morning. No further chest pain, headache has improved. Converted to sinus bradycardia last evening and maintaining. Seen by psych and neuro yesterday.   Vital Signs .    Vitals:   07/24/23 1812 07/24/23 1935 07/24/23 2312 07/25/23 0500  BP: (!) 142/74 133/80 133/77 121/72  Pulse: (!) 55 (!) 58 (!) 44   Resp: 20 18  18   Temp: 97.6 F (36.4 C) 97.9 F (36.6 C) 98.3 F (36.8 C) 97.9 F (36.6 C)  TempSrc: Oral Oral Oral Oral  SpO2: 99% 97% 93%   Weight:      Height:        Intake/Output Summary (Last 24 hours) at 07/25/2023 0826 Last data filed at 07/24/2023 1500 Gross per 24 hour  Intake 445.3 ml  Output --  Net 445.3 ml      07/23/2023   11:01 PM 07/23/2023    3:01 AM 07/11/2023    1:17 PM  Last 3 Weights  Weight (lbs) 169 lb 4.8 oz 170 lb 13.7 oz 171 lb  Weight (kg) 76.794 kg 77.5 kg 77.565 kg      Telemetry/ECG    Sinus Bradycardia 40-50s - Personally Reviewed  Physical Exam .    GEN: No acute distress.   Neck: No JVD Cardiac: RRR, no murmurs, rubs, or gallops.  Respiratory: Clear to auscultation bilaterally. GI: Soft, nontender, non-distended  MS: No edema  Assessment & Plan .     63 y.o. female with a hx of AF (on warfarin), HTN, TBI/SAH, PE, DVT (2002), bipolar disorder, anxiety, sickle cell trait who was seen 07/23/2023 for the evaluation of Afib and chest pain at the request of the EDP.    Paroxysmal Atrial fibrillation with RVR Chest pain -- known hx of atrial fibrillation initially dx in 2014. Presented with chest pain and found to be in afib RVR. While in the ED was planned for IV Diltiazem  but converted to sinus rhythm -- hsTn negative 6>>5, noted to be tender to palpation on exam. Suspect MSK pain.  -- has been resumed on Diltiazem , continued on coumadin  (per pharmD) w/lovenox  bridging   -- has been in and out of atrial fibrillation, started on IV amiodarone yesterday. Now back in sinus and maintaining.  -- HR in the 40-50 range, will reduce Diltiazem  to 180mg  daily and transition to amiodarone 200mg  BID x5 days, then drop to 200mg  daily -- has been following with EP as an outpatient, wants to pursue an ablation. Will ensure follow up arranged    Hx of recurrent DVT/PE -- previously developed DVT while on Eliquis  and has been a coumadin  since that time -- on coumadin , but subtherapeutic INR at 1.5, pharmD to dose. Om lovenox  bridge  -- follows with PCP for coumadin , instructed she will need appt on Monday for recheck   Headache/Hx of migraines -- negative head CT, had received several rounds of medications without improvement. Seen by neurology, MRI negative  -- per primary  Bipolar disorder -- seen by psych yesterday  For questions or updates, please contact [facility] Please consult [email or web address] for contact info under        Signed, Johnie Nailer, NP    ATTENDING ATTESTATION:  After conducting a review of all available clinical information with the care team, interviewing the patient, and performing a physical exam, I agree with  the findings and plan described in this note.   GEN: No acute distress.   HEENT:  MMM, no JVD, no scleral icterus Cardiac: RRR, no murmurs, rubs, or gallops.  Respiratory: Clear to auscultation bilaterally. GI: Soft, nontender, non-distended  MS: No edema; No deformity. Neuro:  Nonfocal  Vasc:  +2 radial pulses  Patient has remained in SR/SB overnight.  Agree with decreasing diltiazem  to 180mg , start PO amiodarone 200mg  BID with taper as above.  Discussed with patient, will observe today and if remains in SR/SB, then she can be discharged later today with cardiology follow up.  Alyssa Backbone, MD Pager [phone]

## 2023-07-25 NOTE — Progress Notes (Addendum)
 Patient called to report an episode of left chest pain and sob approximately 1 hour ago (1530). Dr Landrum Pink paged, new orders received for IV protonix , cycle troponins, IV morphine , and to notify cardiology.  I spoke to Emily Nailer, NP via phone, she stated she would be up to look at EKG and Telemetry

## 2023-07-25 NOTE — Progress Notes (Addendum)
 PHARMACY - ANTICOAGULATION CONSULT NOTE  Pharmacy Consult for warfarin and enoxaparin   Indication: atrial fibrillation, hx DVT/PE  Allergies  Allergen Reactions   Hydromorphone  Other (See Comments)    Stroke-like symptoms. treated with Narcan; Tolerated Percocet in the past.   Iodinated Contrast Media Other (See Comments)    Reaction not fully known   Prochlorperazine Swelling and Other (See Comments)    Compazine - tongue swelling    Patient Measurements: Height: 5\' 3"  (160 cm) Weight: 76.8 kg (169 lb 4.8 oz) IBW/kg (Calculated) : 52.4 HEPARIN DW (KG): 68.9  Vital Signs: Temp: 97.9 F (36.6 C) (05/15 0500) Temp Source: Oral (05/15 0500) BP: 121/72 (05/15 0500) Pulse Rate: 44 (05/14 2312)  Labs: Recent Labs    07/22/23 2106 07/22/23 2322 07/23/23 0528 07/23/23 0927 07/24/23 0457 07/24/23 0826 07/24/23 1241 07/25/23 0502  HGB 13.3  --  13.3  --   --   --   --  12.3  HCT 39.5  --  39.1  --   --   --   --  38.0  PLT 213  --  204  --   --   --   --  192  LABPROT 18.1*  --   --  19.0* 17.7*  --   --  18.7*  INR 1.5*  --   --  1.6* 1.4*  --   --  1.5*  CREATININE 1.01*  --  0.89  --   --  0.97  --  0.96  CKTOTAL  --   --   --   --   --   --  206  --   TROPONINIHS 6 5  --   --   --   --   --   --     Estimated Creatinine Clearance: 61.2 mL/min (by C-G formula based on SCr of 0.96 mg/dL).   Medical History: Past Medical History:  Diagnosis Date   A-fib [facility])    Allergy    Anxiety    Bipolar disorder (HCC)    Chondromalacia patellae 12/14/2003   Depression    DVT (deep vein thrombosis) in pregnancy    DVT (deep venous thrombosis) (HCC) 2001 and 2002   DVT (deep venous thrombosis) (HCC)    x 2   Enlarged pancreas 07/02/2023   Fulness of pancreatic head on CT    Esophagitis 06/06/2016   GERD (gastroesophageal reflux disease)    Hemorrhage    Hyperlipidemia    Migraines    Pulmonary embolism (HCC) 2001   S/P cholecystectomy 09/11/2012   Sickle cell  anemia (HCC)    Suicidal ideation 12/28/2008   Tremor    Assessment: SASHA WILDS is a 63 y.o. year old female presented on 07/22/2023 with concern for afib RVR. On warfarin prior to admission for afib, hx DVT and PE . Previously switched to eliquis  earlier this year, but developed new DVT. Prior to admission regimen, 5mg  MTWThFS and 7.5mg  Sun (total weekly dose 37.5mg ).   INR subtherapeutic, started on lovenox  bridge on admission -INR= 1.5 -warfarin 10mg  ordered 5/13 but she missed her dose 5/13  Goal of Therapy:  INR goal 2-3 Monitor platelets by anticoagulation protocol: Yes   Plan:  Warfarin 10mg  PO x 1 today Daily INR Continue Lovenox  120mg  sq q24h (cost for 5 days as outpatient is about $50)  Baxter Limber, PharmD Clinical Pharmacist **Pharmacist phone directory can now be found on [email or web address] ([email or web address]).  Listed under [facility].

## 2023-07-25 NOTE — Hospital Course (Signed)
 Patient reports feeling good. She reports feeling less anxious because she found out that the amiodarone helped her afib so she found out that does not need to do cardioversion. She reports having a good appetite and looking forward to breakfast. She reports her anxiety is rated a 2/10. She is anxious regarding her future moving forward. She reports using her meditation and deep breathing and she feels that it was helpful. She reports getting an MRI yesterday and she states she prayed to help her keep calm. She states closing her eyes during the process was helpful. She reports the increase in frequency in Ativan  was helpful. We discussed discussing to her OP NP regarding the concurrent use of Seroquel  and Caplyta . She states that her husband came to visit her yesterday. She was happy to see him.  She denies SI and HI.

## 2023-07-25 NOTE — Consult Note (Cosign Needed Addendum)
 [facility] Psychiatric Consult Follow-up  Patient Name: .Emily Rice  MRN: [id number]  DOB: 09-23-1960  Consult Order details:  Orders (From admission, onward)     Start     Ordered   07/23/23 1419  IP CONSULT TO PSYCHIATRY       Ordering Provider: Armenta Landau, MD  Provider:  (Not yet assigned)  Question Answer Comment  Location [facility]   Reason for Consult? Anxiety.  Patient requesting to speak with psychiatry concerning her anxiety and further management.      07/23/23 1418             Mode of Visit: In person    Psychiatry Consult Evaluation  Service Date: Jul 25, 2023 LOS:  LOS: 1 day  Chief Complaint "I feel good"  Primary Psychiatric Diagnoses  GAD 2.   Bipolar 1 disorder 3.   OCD  Assessment  Emily Rice is a 63 y.o. female admitted: Medically on 07/22/2023  9:02 PM for chest pain and palpitations. She carries the psychiatric diagnoses of bipolar 1 disorder, GAD, OCD and has a past medical history of paroxysmal atrial fibrillation, type II disorder, hypertension, prior history of DVT and PE, anxiety, sickle cell trait, TBI/subarachnoid hemorrhage .    Her current presentation of generalized anxiety along with anxiety secondary to her medical condition of paroxysmal atrial fibrillation and worsening depression in the context of tension with her husband and medical conditions is most consistent with a depressive episode along with anxiety. Please see plan below for detailed recommendations.   07/25/2023 The patient is responding well to medication changes and reports no adverse effects. After receiving the news that she will not need a cardioversion, she states that her anxiety has improved. The patient is in agreement to have a discussion with her outpatient NP psychiatrist about the concurrent use of Seroquel  and Caplyta .  We continue to encourage patient to utilize her cope strategies and to seek both individual and family  therapy.  Diagnoses:  Active Hospital problems: Principal Problem:   Paroxysmal atrial fibrillation (HCC) Active Problems:   Chronic migraine w/o aura w/o status migrainosus, not intractable   Bipolar affective disorder, current episode mixed (HCC)   History of DVT (deep vein thrombosis)   Benign essential hypertension   Atrial fibrillation with rapid ventricular response (HCC)   Chest pain    Plan   ## Psychiatric Medication Recommendations:  - Continue Ativan  0.5 mg 3 times daily for anxiety (previously twice daily) -Continue home BuSpar  30 mg twice daily for anxiety - Continue home Lexapro  10 mg daily for depression and anxiety - Continue home Lamictal  200 mg twice daily for mood stabilization - Continue Seroquel  50 mg nightly for mood stabilization and insomnia - Continue home Caplyta  42 mg nightly for mood stabilization  ## Medical Decision Making Capacity:  Not specifically addressed in this encounter  ## Further Work-up:  -- Defer to primary team    ## Disposition:-- There are no psychiatric contraindications to discharge at this time -Patient was informed of therapy options, potentially couples therapy.  -Patient will be seeing her outpatient psychiatrist following discharge.   ## Behavioral / Environmental: - No specific recommendations at this time.     ## Safety and Observation Level:  - Based on my clinical evaluation, I estimate the patient to be at no risk of self harm in the current setting. - At this time, we recommend  routine observation. This decision is based on my review  of the chart including patient's history and current presentation, interview of the patient, mental status examination, and consideration of suicide risk including evaluating suicidal ideation, plan, intent, suicidal or self-harm behaviors, risk factors, and protective factors. This judgment is based on our ability to directly address suicide risk, implement suicide prevention strategies,  and develop a safety plan while the patient is in the clinical setting. Please contact our team if there is a concern that risk level has changed.  CSSR Risk Category:C-SSRS RISK CATEGORY: No Risk  Suicide Risk Assessment: Patient has following modifiable risk factors for suicide: under treated depression  and social isolation, which we are addressing by adjusting medication and encouraging therapy. Patient has following non-modifiable or demographic risk factors for suicide: history of suicide attempt and psychiatric hospitalization Patient has the following protective factors against suicide: Supportive family  Thank you for this consult request. Recommendations have been communicated to the primary team.  We will sign off at this time.   [name], Medical Student     I personally was present and performed or re-performed the history and medical decision-making activities of this service and have verified that the service and findings are accurately documented in the student's note.  Saratha Cunas, MD, PGY-2    History of Present Illness  Relevant Aspects of Hospital Course:  Admitted on 07/22/2023 for chest pain and palpitation. In the ER patient was found to be in A-fib with RVR. Troponins were negative. Chest x-ray unremarkable. D-dimer was negative. Cardiologist on-call was consulted. Patient was started on Cardizem  infusion. Since INR was subtherapeutic patient takes Coumadin  Lovenox  was ordered to bridge. Patient's CT head was unremarkable. Patient has been having persistent headache and I did discuss with neurologist plan is to give Reglan .   Patient Report:  07/25/2023 Patient was seen lying in bed, in no acute distress. She reports feeling less anxious because the amiodarone helped her afib. She slept well last night and her appetite is good. She is looking forward to breakfast. She is anxious regarding her future moving forward after discharge. The medication changes  and the deep breathing exercises have helped her since she has been in the hospital, so she will continue to work on her coping strategies. She reports getting an MRI yesterday and she states she prayed to help her keep calm. She states closing her eyes during the process was helpful. Her husband came to visit her yesterday and she was happy to see him.  She denies SI and HI.   Psych ROS:  Depression: Reports low mood, poor sleep, anhedonia, feelings of hopelessness, poor energy and concentration  Anxiety:  Reports generalized anxiety and OCD symptoms of cleanliness  Mania (lifetime and current): Yes, manic episode most recently a few months ago in which she experienced persistently elevated energy, distractibility, impulsivity, flight of ideas, and increased amount of speech  Psychosis: (lifetime and current): Denies  Collateral information:  No collateral information was obtained at this time.   Review of Systems  Constitutional:  Negative for fever.   Neurological:  Positive for dizziness and headaches.  Psychiatric/Behavioral:  The patient is nervous/anxious.    Psychiatric and Social History  Psychiatric History:  Information collected from patient   Prev Dx/Sx: Bipolar disorder, GAD, OCD Current Psych Provider: Irving Mantle, NP Home Meds (current): Lexapro , Lamictal , Seroquel , Caplyta , Ativan , BuSpar  Previous Med Trials: States she has been on Zoloft and Prozac and felt it was not helpful Therapy: Denies, previously tried years ago  Prior Psych Hospitalization: Yes, about 10 times-first time in 1999 for suicide attempt of cutting her wrists, most recently in 2010 for a manic episode Prior Self Harm: Yes once for suicide attempt in 1999   Family Psych History: Mother-MDD, father-anxiety and MDD, daughter-MDD, son-bipolar disorder   Social History:  Occupational Hx: Currently retired, previously an Nurse, learning disability Living Situation: Lives in [place] with husband of 36  years Spiritual Hx: Christian Access to weapons/lethal means: Denies   Substance History Reports that she has never smoked. She has never used smokeless tobacco. She reports that she does not currently use drugs after having used the following drugs: Benzodiazepines. Frequency: 7.00 times per week. She reports that she does not drink alcohol.   Exam Findings  Physical Exam: Alert woman lying comfortably in bed.  Vital Signs:  Temp:  [97.6 F (36.4 C)-98.5 F (36.9 C)] 97.7 F (36.5 C) (05/15 0914) Pulse Rate:  [44-97] 44 (05/14 2312) Resp:  [15-20] 18 (05/15 0914) BP: (121-142)/(72-88) 129/78 (05/15 0914) SpO2:  [93 %-100 %] 93 % (05/14 2312) Blood pressure 129/78, pulse (!) 44, temperature 97.7 F (36.5 C), temperature source Oral, resp. rate 18, height 5\' 3"  (1.6 m), weight 76.8 kg, SpO2 93%. Body mass index is 29.99 kg/m.  Physical Exam Constitutional:      Appearance: Normal appearance.  Cardiovascular:     Rate and Rhythm: Bradycardia present.  Neurological:     General: No focal deficit present.     Mental Status: She is alert.  Psychiatric:        Attention and Perception: Attention and perception normal.        Mood and Affect: Mood and affect normal.        Speech: Speech normal.        Behavior: Behavior normal. Behavior is cooperative.        Thought Content: Thought content normal.        Cognition and Memory: Cognition normal.        Judgment: Judgment normal.     Mental Status Exam: General Appearance: In Hospital gown   Orientation:  Full (Time, Place, and Person)  Memory:  Immediate;   Good Recent;   Good Remote;   Fair  Concentration:  Concentration: Good and Attention Span: Good  Recall:  Fair  Attention  Good  Eye Contact:  Good  Speech:  Clear and Coherent and Normal Rate  Language:  Good  Volume:  Normal  Mood: Anxious   Affect:  Congruent  Thought Process:  Coherent, Goal Directed, and Linear  Thought Content:  WDL and Hallucinations:  None  Suicidal Thoughts:  No  Homicidal Thoughts:  No  Judgement:  Good  Insight:  Fair  Psychomotor Activity:  Normal  Akathisia:  No  Fund of Knowledge:  Good      Assets:  Communication Skills Desire for Improvement Housing Intimacy Leisure Time  Cognition:  WNL  ADL's:  Intact  AIMS (if indicated):        Other History   These have been pulled in through the EMR, reviewed, and updated if appropriate.  Family History:  The patient's family history includes ADD / ADHD in her son; Anxiety disorder in her daughter; Atrial fibrillation in her father; CAD in her brother; Depression in her son; Diabetes in her sister; Heart disease in her father; Hypertension in her father, maternal uncle, and mother; Stroke in her father.  Medical History: Past Medical History:  Diagnosis Date   A-fib (HCC)  Allergy    Anxiety    Bipolar disorder (HCC)    Chondromalacia patellae 12/14/2003   Depression    DVT (deep vein thrombosis) in pregnancy    DVT (deep venous thrombosis) (HCC) 2001 and 2002   DVT (deep venous thrombosis) (HCC)    x 2   Enlarged pancreas 07/02/2023   Fulness of pancreatic head on CT    Esophagitis 06/06/2016   GERD (gastroesophageal reflux disease)    Hemorrhage    Hyperlipidemia    Migraines    Pulmonary embolism (HCC) 2001   S/P cholecystectomy 09/11/2012   Sickle cell anemia (HCC)    Suicidal ideation 12/28/2008   Tremor     Surgical History: Past Surgical History:  Procedure Laterality Date   CHOLECYSTECTOMY  2014   CHOLECYSTECTOMY     HUMERUS IM NAIL Right 03/03/2020   Procedure: INTRAMEDULLARY (IM) NAIL HUMERAL;  Surgeon: Laneta Pintos, MD;  Location: [facility] OR;  Service: Orthopedics;  Laterality: Right;   OVARIAN CYST REMOVAL     TONSILLECTOMY       Medications:   Current Facility-Administered Medications:    acetaminophen -caffeine (EXCEDRIN TENSION HEADACHE) 500-65 MG per tablet 1 tablet, 1 tablet, Oral, Q12H PRN, Yadav, Priyanka O, MD, 1  tablet at 07/24/23 1530   amiodarone (PACERONE) tablet 200 mg, 200 mg, Oral, BID, Roberts, Lindsay B, NP, 200 mg at 07/25/23 1610   busPIRone  (BUSPAR ) tablet 30 mg, 30 mg, Oral, BID, Kakrakandy, Arshad N, MD, 30 mg at 07/25/23 9604   carbidopa -levodopa  (SINEMET  IR) 25-100 MG per tablet immediate release 1 tablet, 1 tablet, Oral, TID, Angelene Kelly, MD, 1 tablet at 07/25/23 5409   diltiazem  (CARDIZEM  CD) 24 hr capsule 180 mg, 180 mg, Oral, Daily, Johnie Nailer B, NP, 180 mg at 07/25/23 8119   enoxaparin  (LOVENOX ) injection 120 mg, 120 mg, Subcutaneous, Q24H, Raliegh Burgess, RPH, 120 mg at 07/25/23 1478   escitalopram  (LEXAPRO ) tablet 10 mg, 10 mg, Oral, q morning, Angelene Kelly, MD, 10 mg at 07/25/23 0832   fluticasone (FLONASE) 50 MCG/ACT nasal spray 2 spray, 2 spray, Each Nare, Daily, Armenta Landau, MD, 2 spray at 07/25/23 2956   lamoTRIgine  (LAMICTAL ) tablet 200 mg, 200 mg, Oral, BID, Angelene Kelly, MD, 200 mg at 07/25/23 2130   loratadine (CLARITIN) tablet 10 mg, 10 mg, Oral, Daily, Armenta Landau, MD, 10 mg at 07/25/23 8657   LORazepam  (ATIVAN ) tablet 0.5 mg, 0.5 mg, Oral, TID, Elese Rane B, MD, 0.5 mg at 07/25/23 8469   lumateperone  tosylate (CAPLYTA ) capsule 42 mg, 42 mg, Oral, QHS, Raliegh Burgess, RPH, 42 mg at 07/24/23 2227   ondansetron  (ZOFRAN ) injection 4 mg, 4 mg, Intravenous, Q6H PRN, Regalado, Belkys A, MD, 4 mg at 07/24/23 1910   oxyCODONE -acetaminophen  (PERCOCET/ROXICET) 5-325 MG per tablet 1 tablet, 1 tablet, Oral, Q6H PRN, Regalado, Belkys A, MD, 1 tablet at 07/24/23 1935   QUEtiapine  (SEROQUEL ) tablet 50 mg, 50 mg, Oral, QHS, Angelene Kelly, MD, 50 mg at 07/24/23 2227   warfarin (COUMADIN ) tablet 10 mg, 10 mg, Oral, ONCE-1600, Raliegh Burgess, [facility]   Warfarin - Pharmacist Dosing Inpatient, , Does not apply, q1600, Angelene Kelly, MD, Given at 07/24/23 1531  Allergies: Allergies  Allergen Reactions   Hydromorphone  Other (See  Comments)    Stroke-like symptoms. treated with Narcan; Tolerated Percocet in the past.   Iodinated Contrast Media Other (See Comments)    Reaction not fully known   Prochlorperazine Swelling and Other (  See Comments)    Compazine - tongue swelling

## 2023-07-25 NOTE — Plan of Care (Signed)

## 2023-07-26 ENCOUNTER — Ambulatory Visit: Admitting: Cardiology

## 2023-07-26 ENCOUNTER — Other Ambulatory Visit (HOSPITAL_COMMUNITY): Payer: Self-pay

## 2023-07-26 ENCOUNTER — Ambulatory Visit

## 2023-07-26 DIAGNOSIS — I480 Paroxysmal atrial fibrillation: Secondary | ICD-10-CM | POA: Diagnosis not present

## 2023-07-26 LAB — BASIC METABOLIC PANEL WITH GFR
Anion gap: 12 (ref 5–15)
BUN: 13 mg/dL (ref 6–20)
CO2: 21 mmol/L — ABNORMAL LOW (ref 22–32)
Calcium: 9.2 mg/dL (ref 8.9–10.3)
Chloride: 107 mmol/L (ref 98–111)
Creatinine, Ser: 0.9 mg/dL (ref 0.44–1.00)
GFR, Estimated: >60 mL/min (ref >60)
Glucose, Bld: 81 mg/dL (ref 70–99)
Potassium: 4.9 mmol/L (ref 3.5–5.1)
Sodium: 140 mmol/L (ref 135–145)

## 2023-07-26 LAB — URINE CULTURE: Culture: 10000 — AB

## 2023-07-26 LAB — PROTIME-INR
INR: 1.9 — ABNORMAL HIGH (ref 0.8–1.2)
Prothrombin Time: 21.6 s — ABNORMAL HIGH (ref 11.4–15.2)

## 2023-07-26 LAB — VITAMIN D 25 HYDROXY (VIT D DEFICIENCY, FRACTURES): Vit D, 25-Hydroxy: 54.08 ng/mL (ref 30–100)

## 2023-07-26 MED ORDER — OXYCODONE-ACETAMINOPHEN 5-325 MG PO TABS: 1.0000 | ORAL_TABLET | Freq: Three times a day (TID) | ORAL | 0 refills | Status: AC | PRN

## 2023-07-26 MED ORDER — DIVALPROEX SODIUM 500 MG PO DR TAB: 500.0000 mg | DELAYED_RELEASE_TABLET | Freq: Every evening | ORAL | 0 refills | Status: DC | PRN

## 2023-07-26 MED ORDER — PANTOPRAZOLE SODIUM 40 MG PO TBEC
40.0000 mg | DELAYED_RELEASE_TABLET | Freq: Two times a day (BID) | ORAL | 0 refills | Status: DC
  Filled 2023-07-26: qty 30, 15d supply, fill #0

## 2023-07-26 MED ORDER — OXYCODONE-ACETAMINOPHEN 5-325 MG PO TABS
1.0000 | ORAL_TABLET | Freq: Three times a day (TID) | ORAL | 0 refills | Status: DC | PRN
  Filled 2023-07-26: qty 9, 3d supply, fill #0

## 2023-07-26 MED ORDER — DILTIAZEM HCL ER COATED BEADS 120 MG PO CP24
120.0000 mg | ORAL_CAPSULE | Freq: Every day | ORAL | Status: DC
  Administered 2023-07-26: 120 mg via ORAL
  Filled 2023-07-26 (×2): qty 1

## 2023-07-26 MED ORDER — ENOXAPARIN SODIUM 120 MG/0.8ML IJ SOSY
120.0000 mg | PREFILLED_SYRINGE | INTRAMUSCULAR | 0 refills | Status: DC
  Filled 2023-07-26: qty 4.8, 6d supply, fill #0

## 2023-07-26 MED ORDER — LORAZEPAM 0.5 MG PO TABS: 0.5000 mg | ORAL_TABLET | Freq: Three times a day (TID) | ORAL | 0 refills | Status: AC

## 2023-07-26 MED ORDER — VALPROATE SODIUM 100 MG/ML IV SOLN
500.0000 mg | Freq: Once | INTRAVENOUS | Status: AC
  Administered 2023-07-26: 500 mg via INTRAVENOUS
  Filled 2023-07-26: qty 5

## 2023-07-26 MED ORDER — DILTIAZEM HCL ER COATED BEADS 120 MG PO CP24
120.0000 mg | ORAL_CAPSULE | Freq: Every day | ORAL | 1 refills | Status: DC
  Filled 2023-07-26: qty 30, 30d supply, fill #0

## 2023-07-26 MED ORDER — ENOXAPARIN SODIUM 120 MG/0.8ML IJ SOSY: 120.0000 mg | PREFILLED_SYRINGE | INTRAMUSCULAR | 0 refills | Status: DC

## 2023-07-26 MED ORDER — AMIODARONE HCL 200 MG PO TABS
ORAL_TABLET | ORAL | 0 refills | Status: DC
  Filled 2023-07-26: qty 60, 57d supply, fill #0

## 2023-07-26 MED ORDER — LORAZEPAM 0.5 MG PO TABS
0.5000 mg | ORAL_TABLET | Freq: Three times a day (TID) | ORAL | 0 refills | Status: DC
  Filled 2023-07-26: qty 9, 3d supply, fill #0

## 2023-07-26 MED ORDER — CALCIUM CARBONATE ANTACID 500 MG PO CHEW: 1.0000 | CHEWABLE_TABLET | Freq: Every day | ORAL | 3 refills | Status: AC

## 2023-07-26 MED ORDER — PANTOPRAZOLE SODIUM 40 MG PO TBEC
40.0000 mg | DELAYED_RELEASE_TABLET | Freq: Two times a day (BID) | ORAL | Status: DC
  Administered 2023-07-26: 40 mg via ORAL
  Filled 2023-07-26: qty 1

## 2023-07-26 NOTE — TOC CM/SW Note (Signed)
 Transition of Care [facility]) - Inpatient Brief Assessment   Patient Details  Name: Emily Rice MRN: [id number] Date of Birth: 13-Nov-1960  [facility] CM/SW Contact:    Cosimo Diones, RN Phone Number: 07/26/2023, 10:26 AM   Clinical Narrative: Patient presented for chest pain and palpitations. PTA patient was independent from home. Patient has insurance and PCP and she has no issues getting to appointments. No home needs identified at this time.    Transition of Care Asessment: Insurance and Status: Insurance coverage has been reviewed Patient has primary care physician: Yes Home environment has been reviewed: reviewed Prior level of function:: independent Prior/Current Home Services: No current home services Social Drivers of Health Review: SDOH reviewed no interventions necessary Readmission risk has been reviewed: Yes Transition of care needs: no transition of care needs at this time

## 2023-07-26 NOTE — Discharge Summary (Addendum)
 Physician Discharge Summary   Patient: Emily Rice MRN: [id number] DOB: 1961/01/19  Admit date:     07/22/2023  Discharge date: 07/26/23  Discharge Physician: Danette Duos   PCP: Abram Abraham, NP-C   Recommendations at discharge:   Follow up with Cardiology for further care of A fib  FU with neurologist for Migraine tx. FU with Psych for anxiety disorder Needs INR on Monday   Discharge Diagnoses: Principal Problem:   Paroxysmal atrial fibrillation (HCC) Active Problems:   Chronic migraine w/o aura w/o status migrainosus, not intractable   Bipolar affective disorder, current episode mixed (HCC)   History of DVT (deep vein thrombosis)   Benign essential hypertension   Atrial fibrillation with rapid ventricular response (HCC)   Chest pain  Resolved Problems:   * No resolved hospital problems. *  Hospital Course: 63 year old with past medical history significant for paroxysmal A-fib, hypertension, history of DVT and PE, anxiety, sickle cell trait, TBI subarachnoid hemorrhage presented to the ED complaining of chest pain and palpitation.  Patient was found to be in A-fib with RVR.  She was a started on Cardizem  drip.  Cardiology has been consulted.  Patient has been also complaining of headaches.    Assessment and Plan: 1-A-fib RVR: Paroxysmal Afib. -On Lovenox  and Coumadin . -Started on IV  amiodarone. Transition to oral.  -No need for cardioversion. -Bradycardia overnight Cardizem , dose reduce to 120 mg  -Ok to discharge.    Chest pain: Cardiology following. Troponin negative.   Migraine headaches: CT head unremarkable. She has received Multiples doses Migraine cocktail. Received Migraine  cocktail Neurology consulted. Recommend MRI which was negative Continue with excedrin She is still having headaches today, she asked me to talk to neurologist again. I spoke with Dr Bonnita Buttner, [facility] to do Valproic acid, one dose less likely to cause significant side effect.  Patient was willing to try depakote. Home this afternoon. Discharge on short course of percocet. Needs close follow up with her neurologist   Hypertension: On cardizem    Bipolar disorder and anxiety disorder: Appreciate Psych eval. Ativan  change to TID PRN. Continue BuSpar , Lexapro , Lamictal , Seroquel , Caplyta    History of DVT PE: Continue Lovenox  and Coumadin    Increase Urgency: UA with 6-10 WBC> check Urine culture. Insignificant growth. She doesn't have significant symptoms of UTI., avoid antibiotics at this time, to avoid adverse effects like C diff.    Borde line low calcium: check albumin .      Estimated body mass index is 29.99 kg/m as calculated from the following:   Height as of this encounter: 5\' 3"  (1.6 m).   Weight as of this encounter: 76.8 kg.         Consultants: Cardiology  Procedures performed: None Disposition: Home Diet recommendation:  Discharge Diet Orders (From admission, onward)     Start     Ordered   07/26/23 0000  Diet - low sodium heart healthy        07/26/23 1155           Cardiac diet DISCHARGE MEDICATION: Allergies as of 07/26/2023       Reactions   Hydromorphone  Other (See Comments)   Stroke-like symptoms. treated with Narcan; Tolerated Percocet in the past.   Iodinated Contrast Media Other (See Comments)   Reaction not fully known   Prochlorperazine Swelling, Other (See Comments)   Compazine - tongue swelling        Medication List     TAKE these medications    acetaminophen   325 MG tablet Commonly known as: TYLENOL  Take 2 tablets (650 mg total) by mouth every 6 (six) hours as needed for mild pain or moderate pain.   amiodarone 200 MG tablet Commonly known as: PACERONE Take 1 tablet (200 mg total) twice a day for 3 days then take 1 tablet (200 mg total) once daily.   aspirin-acetaminophen -caffeine 250-250-65 MG tablet Commonly known as: EXCEDRIN MIGRAINE Take 2 tablets by mouth 2 (two) times daily as needed for  migraine.   busPIRone  30 MG tablet Commonly known as: BUSPAR  Take 1 tablet (30 mg total) by mouth 2 (two) times daily.   Caplyta  42 MG capsule Generic drug: lumateperone  tosylate TAKE 1 CAPSULE EVERY DAY What changed: Another medication with the same name was removed. Continue taking this medication, and follow the directions you see here.   carbidopa -levodopa  25-100 MG tablet Commonly known as: SINEMET  IR Take one tablet in the am and one tablet in the pm What changed:  how much to take how to take this when to take this   cetirizine 10 MG tablet Commonly known as: ZYRTEC Take 10 mg by mouth daily as needed for allergies.   Cholecalciferol  75 MCG (3000 UT) Tabs Take 3,000 Units by mouth daily.   diltiazem  120 MG 24 hr capsule Commonly known as: CARDIZEM  CD Take 1 capsule (120 mg total) by mouth daily. What changed:  medication strength how much to take when to take this   diphenhydrAMINE  50 MG tablet Commonly known as: Benadryl  Allergy Extra Str Take 1 tablet (50 mg total) by mouth once for 1 dose. Take 1 tablet 1 hour prior to CT scan.   enoxaparin  120 MG/0.8ML injection Commonly known as: LOVENOX  Inject 0.8 mLs (120 mg total) into the skin daily for 3 days. Start taking on: Jul 27, 2023   escitalopram  10 MG tablet Commonly known as: LEXAPRO  TAKE 1 TABLET AT BEDTIME What changed: when to take this   fexofenadine 180 MG tablet Commonly known as: ALLEGRA Take 180 mg by mouth as needed for allergies or rhinitis.   fluticasone 50 MCG/ACT nasal spray Commonly known as: FLONASE Place 1 spray into both nostrils daily as needed for allergies.   lamoTRIgine  200 MG tablet Commonly known as: LAMICTAL  TAKE 1 TABLET TWICE DAILY   LORazepam  0.5 MG tablet Commonly known as: ATIVAN  Take 1 tablet (0.5 mg total) by mouth 3 (three) times daily for 3 days. What changed: when to take this   metFORMIN  500 MG 24 hr tablet Commonly known as: GLUCOPHAGE -XR Take 2 tablets  (1,000 mg total) by mouth 2 (two) times daily with a meal.   multivitamin with minerals Tabs tablet Take 1 tablet by mouth daily. What changed: additional instructions   oxyCODONE -acetaminophen  5-325 MG tablet Commonly known as: PERCOCET/ROXICET Take 1 tablet by mouth every 8 (eight) hours as needed for up to 3 days for moderate pain (pain score 4-6).   pantoprazole  40 MG tablet Commonly known as: PROTONIX  Take 1 tablet (40 mg total) by mouth 2 (two) times daily.   polyethylene glycol 17 g packet Commonly known as: MIRALAX  / GLYCOLAX  Take 17 g by mouth 2 (two) times daily. What changed:  when to take this reasons to take this   QUEtiapine  50 MG tablet Commonly known as: SEROQUEL  TAKE 1 TABLET AT BEDTIME   warfarin 5 MG tablet Commonly known as: COUMADIN  Take as directed. If you are unsure how to take this medication, talk to your nurse or doctor. Original instructions: Take 5-7.5  mg by mouth See admin instructions. Take 1 tablet by mouth daily, then take 1 and 1/2 tablet every Sunday Notes to patient: 5mg  MTWThFS and 7.5mg  Sun        Follow-up Information     Boyce Byes, MD Follow up in 1 week(s).   Specialties: Cardiology, Radiology Contact information: [address] [place] [address] [phone]                Discharge Exam: Cleavon Curls Weights   07/23/23 0301 07/23/23 2301  Weight: 77.5 kg 76.8 kg   General; NAD  Condition at discharge: stable  The results of significant diagnostics from this hospitalization (including imaging, microbiology, ancillary and laboratory) are listed below for reference.   Imaging Studies: MR BRAIN WO CONTRAST Result Date: 07/24/2023 CLINICAL DATA:  Headache, increasing frequency or severity. EXAM: MRI HEAD WITHOUT CONTRAST TECHNIQUE: Multiplanar, multiecho pulse sequences of the brain and surrounding structures were obtained without intravenous contrast. COMPARISON:  CT head 07/23/2023. FINDINGS: Brain:  No acute infarct. No evidence of intracranial hemorrhage. Nonspecific scattered T2/FLAIR hyperintensity in the periventricular and subcortical white matter. Chronic microhemorrhage in the parasagittal left parietal lobe. No edema, mass effect, or midline shift. Posterior fossa is unremarkable. Normal appearance of midline structures. The basilar cisterns are patent. No extra-axial fluid collections. Ventricles: Normal size and configuration of the ventricles. Vascular: Skull base flow voids are visualized. Skull and upper cervical spine: No focal abnormality. Sinuses/Orbits: Orbits are symmetric. Paranasal sinuses are clear. Other: Mastoid air cells are clear. IMPRESSION: No acute intracranial abnormality. White matter signal abnormality suggestive of mild chronic microvascular ischemic changes. Electronically Signed   By: Denny Flack M.D.   On: 07/24/2023 17:48   VAS US  LOWER EXTREMITY VENOUS (DVT) Result Date: 07/23/2023  Lower Venous DVT Study Patient Name:  PHRONIA ASCH  Date of Exam:   07/23/2023 Medical Rec #: [id number]        Accession #:    [id number] Date of Birth: 08-06-1960       Patient Gender: F Patient Age:   89 years Exam Location:  [facility] Procedure:      VAS US  LOWER EXTREMITY VENOUS (DVT) Referring Phys: Melford Spotted --------------------------------------------------------------------------------  Indications: Edema.  Performing Technologist: Aloma Arrant RVS  Examination Guidelines: A complete evaluation includes B-mode imaging, spectral Doppler, color Doppler, and power Doppler as needed of all accessible portions of each vessel. Bilateral testing is considered an integral part of a complete examination. Limited examinations for reoccurring indications may be performed as noted. The reflux portion of the exam is performed with the patient in reverse Trendelenburg.  +---------+---------------+---------+-----------+----------+--------------+ RIGHT     CompressibilityPhasicitySpontaneityPropertiesThrombus Aging +---------+---------------+---------+-----------+----------+--------------+ CFV      Full           Yes      Yes                                 +---------+---------------+---------+-----------+----------+--------------+ SFJ      Full                                                        +---------+---------------+---------+-----------+----------+--------------+ FV Prox  Full                                                        +---------+---------------+---------+-----------+----------+--------------+  FV Mid   Full                                                        +---------+---------------+---------+-----------+----------+--------------+ FV DistalFull                                                        +---------+---------------+---------+-----------+----------+--------------+ PFV      Full                                                        +---------+---------------+---------+-----------+----------+--------------+ POP      Full           Yes      Yes                                 +---------+---------------+---------+-----------+----------+--------------+ PTV      Full                                                        +---------+---------------+---------+-----------+----------+--------------+ PERO     Full                                                        +---------+---------------+---------+-----------+----------+--------------+   +---------+---------------+---------+-----------+----------+--------------+ LEFT     CompressibilityPhasicitySpontaneityPropertiesThrombus Aging +---------+---------------+---------+-----------+----------+--------------+ CFV      Full           Yes      Yes                                 +---------+---------------+---------+-----------+----------+--------------+ SFJ      Full                                                         +---------+---------------+---------+-----------+----------+--------------+ FV Prox  Full                                                        +---------+---------------+---------+-----------+----------+--------------+ FV Mid   Full                                                        +---------+---------------+---------+-----------+----------+--------------+  FV DistalFull                                                        +---------+---------------+---------+-----------+----------+--------------+ PFV      Full                                                        +---------+---------------+---------+-----------+----------+--------------+ POP      Full           Yes      Yes                                 +---------+---------------+---------+-----------+----------+--------------+ PTV      Full                                                        +---------+---------------+---------+-----------+----------+--------------+ PERO     Full                                                        +---------+---------------+---------+-----------+----------+--------------+     Summary: BILATERAL: - No evidence of deep vein thrombosis seen in the lower extremities, bilaterally. -No evidence of popliteal cyst, bilaterally.   *See table(s) above for measurements and observations. Electronically signed by Delaney Fearing on 07/23/2023 at 4:08:59 PM.    Final    ECHOCARDIOGRAM COMPLETE Result Date: 07/23/2023    ECHOCARDIOGRAM REPORT   Patient Name:   Emily Rice Date of Exam: 07/23/2023 Medical Rec #:  [id number]       Height:       63.0 in Accession #:    [id number]      Weight:       170.9 lb Date of Birth:  1961/01/02      BSA:          1.808 m Patient Age:    62 years        BP:           108/70 mmHg Patient Gender: F               HR:           70 bpm. Exam Location:  Inpatient Procedure: 2D Echo (Both Spectral and Color Flow Doppler were  utilized during            procedure). Indications:    chest pain  History:        Patient has prior history of Echocardiogram examinations, most                 recent 03/08/2020. Arrythmias:Atrial Fibrillation; Risk                 Factors:Hypertension.  Sonographer:    [name] RDCS Referring Phys: [address] [facility]  IMPRESSIONS  1. Left ventricular ejection fraction, by estimation, is 55 to 60%. The left ventricle has normal function. The left ventricle has no regional wall motion abnormalities. Left ventricular diastolic parameters were normal.  2. Right ventricular systolic function is normal. The right ventricular size is normal. There is normal pulmonary artery systolic pressure.  3. The mitral valve is normal in structure. No evidence of mitral valve regurgitation. No evidence of mitral stenosis.  4. The aortic valve is tricuspid. Aortic valve regurgitation is not visualized. No aortic stenosis is present.  5. The inferior vena cava is normal in size with greater than 50% respiratory variability, suggesting right atrial pressure of 3 mmHg.  6. PACs and PVCs noted in study. Comparison(s): Unable to load 2021 study. FINDINGS  Left Ventricle: No Strain or 3D Transmitted. Left ventricular ejection fraction, by estimation, is 55 to 60%. The left ventricle has normal function. The left ventricle has no regional wall motion abnormalities. Strain was performed and the global longitudinal strain is indeterminate. The left ventricular internal cavity size was normal in size. There is no left ventricular hypertrophy. Left ventricular diastolic parameters were normal. Right Ventricle: The right ventricular size is normal. No increase in right ventricular wall thickness. Right ventricular systolic function is normal. There is normal pulmonary artery systolic pressure. The tricuspid regurgitant velocity is 2.25 m/s, and  with an assumed right atrial pressure of 3 mmHg, the estimated right ventricular  systolic pressure is 23.2 mmHg. Left Atrium: Left atrial size was normal in size. Right Atrium: Right atrial size was normal in size. Pericardium: There is no evidence of pericardial effusion. Mitral Valve: The mitral valve is normal in structure. No evidence of mitral valve regurgitation. No evidence of mitral valve stenosis. Tricuspid Valve: The tricuspid valve is normal in structure. Tricuspid valve regurgitation is mild . No evidence of tricuspid stenosis. Aortic Valve: The aortic valve is tricuspid. Aortic valve regurgitation is not visualized. No aortic stenosis is present. Pulmonic Valve: The pulmonic valve was normal in structure. Pulmonic valve regurgitation is trivial. No evidence of pulmonic stenosis. Aorta: The aortic root and ascending aorta are structurally normal, with no evidence of dilitation. Venous: The inferior vena cava is normal in size with greater than 50% respiratory variability, suggesting right atrial pressure of 3 mmHg. IAS/Shunts: No atrial level shunt detected by color flow Doppler. Additional Comments: 3D was performed not requiring image post processing on an independent workstation and was indeterminate.  LEFT VENTRICLE PLAX 2D LVIDd:         5.10 cm   Diastology LVIDs:         3.80 cm   LV e' medial:    9.79 cm/s LV PW:         0.70 cm   LV E/e' medial:  10.4 LV IVS:        0.80 cm   LV e' lateral:   12.40 cm/s LVOT diam:     1.60 cm   LV E/e' lateral: 8.2 LV SV:         42 LV SV Index:   23 LVOT Area:     2.01 cm  RIGHT VENTRICLE             IVC RV Basal diam:  2.80 cm     IVC diam: 1.70 cm RV S prime:     12.30 cm/s TAPSE (M-mode): 1.9 cm LEFT ATRIUM             Index  RIGHT ATRIUM           Index LA diam:        4.00 cm 2.21 cm/m   RA Area:     14.70 cm LA Vol (A2C):   46.3 ml 25.60 ml/m  RA Volume:   38.80 ml  21.45 ml/m LA Vol (A4C):   45.2 ml 24.99 ml/m LA Biplane Vol: 49.3 ml 27.26 ml/m  AORTIC VALVE LVOT Vmax:   102.00 cm/s LVOT Vmean:  64.900 cm/s LVOT VTI:     0.207 m  AORTA Ao Asc diam: 3.20 cm MITRAL VALVE                TRICUSPID VALVE MV Area (PHT): 4.89 cm     TR Peak grad:   20.2 mmHg MV Decel Time: 155 msec     TR Vmax:        225.00 cm/s MV E velocity: 102.00 cm/s MV A velocity: 75.10 cm/s   SHUNTS MV E/A ratio:  1.36         Systemic VTI:  0.21 m                             Systemic Diam: 1.60 cm Gloriann Larger MD Electronically signed by Gloriann Larger MD Signature Date/Time: 07/23/2023/11:26:36 AM    Final    CT HEAD WO CONTRAST ( ) Result Date: 07/23/2023 CLINICAL DATA:  63 year old female with sudden onset chest pain, shortness of breath. Increased headache. Atrial fibrillation with RVR. EXAM: CT HEAD WITHOUT CONTRAST TECHNIQUE: Contiguous axial images were obtained from the base of the skull through the vertex without intravenous contrast. RADIATION DOSE REDUCTION: This exam was performed according to the departmental dose-optimization program which includes automated exposure control, adjustment of the mA and/or kV according to patient size and/or use of iterative reconstruction technique. COMPARISON:  Brain MRI and head CT 05/19/2022 FINDINGS: Brain: Cerebral volume is stable from last year, normal for age. No midline shift, ventriculomegaly, mass effect, evidence of mass lesion, intracranial hemorrhage or evidence of cortically based acute infarction. Mild for age cerebral white matter heterogeneity. Stable gray-white matter differentiation throughout the brain. Vascular: Calcified atherosclerosis at the skull base. No suspicious intracranial vascular hyperdensity. Skull: Stable and intact. Sinuses/Orbits: Visualized paranasal sinuses and mastoids are stable and well aerated. Other: Visualized orbits and scalp soft tissues are within normal limits. IMPRESSION: Stable since last year and largely negative for age non contrast CT appearance of the brain. Electronically Signed   By: Marlise Simpers M.D.   On: 07/23/2023 04:44   DG Chest Portable 1  View Result Date: 07/22/2023 CLINICAL DATA:  Chest pain and shortness of breath EXAM: PORTABLE CHEST 1 VIEW COMPARISON:  12/06/2022 FINDINGS: Cardiac shadow is within normal limits. Lungs are clear bilaterally. Postsurgical changes in the right humerus are seen. IMPRESSION: No active disease. Electronically Signed   By: Violeta Grey M.D.   On: 07/22/2023 22:03    Microbiology: Results for orders placed or performed during the hospital encounter of 07/22/23  Urine Culture (for pregnant, neutropenic or urologic patients or patients with an indwelling urinary catheter)     Status: Abnormal   Collection Time: 07/25/23 11:39 AM   Specimen: Urine, Clean Catch  Result Value Ref Range Status   Specimen Description URINE, CLEAN CATCH  Final   Special Requests NONE  Final   Culture (A)  Final    10,000 COLONIES/mL GROUP B STREP(S.AGALACTIAE)ISOLATED TESTING AGAINST S. AGALACTIAE  NOT ROUTINELY PERFORMED DUE TO PREDICTABILITY OF AMP/PEN/VAN SUSCEPTIBILITY. Performed at [facility], [address] [place], [place] [address]    Report Status 07/26/2023 FINAL  Final    Labs: CBC: Recent Labs  Lab 07/22/23 2106 07/23/23 0528 07/25/23 0502  WBC 11.3* 10.4 8.6  NEUTROABS  --  6.0  --   HGB 13.3 13.3 12.3  HCT 39.5 39.1 38.0  MCV 78.4* 77.9* 79.8*  PLT 213 204 192   Basic Metabolic Panel: Recent Labs  Lab 07/22/23 2106 07/23/23 0528 07/24/23 0826 07/25/23 0502 07/26/23 0808  NA 137 139 140 139 140  K 3.7 3.5 3.9 4.3 4.9  CL 105 106 107 106 107  CO2 19* 23 23 26  21*  GLUCOSE 105* 102* 93 87 81  BUN 14 13 11 13 13   CREATININE 1.01* 0.89 0.97 0.96 0.90  CALCIUM 9.2 9.1 8.8* 8.8* 9.2  MG  --  2.0  --   --   --    Liver Function Tests: Recent Labs  Lab 07/23/23 0528 07/25/23 1239  AST 23  --   ALT 17  --   ALKPHOS 56  --   BILITOT 0.5  --   PROT 7.1  --   ALBUMIN  3.9 3.7   CBG: Recent Labs  Lab 07/22/23 2133  GLUCAP 100*    Discharge time spent: greater than 30  minutes.  Signed: Danette Duos, MD [facility] 07/26/2023

## 2023-07-26 NOTE — Progress Notes (Signed)
 PHARMACY - ANTICOAGULATION CONSULT NOTE  Pharmacy Consult for warfarin and enoxaparin   Indication: atrial fibrillation, hx DVT/PE  Allergies  Allergen Reactions   Hydromorphone  Other (See Comments)    Stroke-like symptoms. treated with Narcan; Tolerated Percocet in the past.   Iodinated Contrast Media Other (See Comments)    Reaction not fully known   Prochlorperazine Swelling and Other (See Comments)    Compazine - tongue swelling    Patient Measurements: Height: 5\' 3"  (160 cm) Weight: 76.8 kg (169 lb 4.8 oz) IBW/kg (Calculated) : 52.4 HEPARIN DW (KG): 68.9  Vital Signs: Temp: 98.4 F (36.9 C) (05/16 0445) Temp Source: Oral (05/16 0445) BP: 97/63 (05/16 0445) Pulse Rate: 38 (05/15 2300)  Labs: Recent Labs    07/24/23 0457 07/24/23 0826 07/24/23 1241 07/25/23 0502 07/25/23 1803 07/25/23 2002 07/26/23 0808  HGB  --   --   --  12.3  --   --   --   HCT  --   --   --  38.0  --   --   --   PLT  --   --   --  192  --   --   --   LABPROT 17.7*  --   --  18.7*  --   --  21.6*  INR 1.4*  --   --  1.5*  --   --  1.9*  CREATININE  --  0.97  --  0.96  --   --  0.90  CKTOTAL  --   --  206  --   --   --   --   TROPONINIHS  --   --   --   --  4 4  --     Estimated Creatinine Clearance: 65.3 mL/min (by C-G formula based on SCr of 0.9 mg/dL).   Medical History: Past Medical History:  Diagnosis Date   A-fib [facility])    Allergy    Anxiety    Bipolar disorder (HCC)    Chondromalacia patellae 12/14/2003   Depression    DVT (deep vein thrombosis) in pregnancy    DVT (deep venous thrombosis) (HCC) 2001 and 2002   DVT (deep venous thrombosis) (HCC)    x 2   Enlarged pancreas 07/02/2023   Fulness of pancreatic head on CT    Esophagitis 06/06/2016   GERD (gastroesophageal reflux disease)    Hemorrhage    Hyperlipidemia    Migraines    Pulmonary embolism (HCC) 2001   S/P cholecystectomy 09/11/2012   Sickle cell anemia (HCC)    Suicidal ideation 12/28/2008   Tremor     Assessment: Emily Rice is a 63 y.o. year old female presented on 07/22/2023 with concern for afib RVR. On warfarin prior to admission for afib, hx DVT and PE . Previously switched to eliquis  earlier this year, but developed new DVT. Prior to admission regimen, 5mg  MTWThFS and 7.5mg  Sun (total weekly dose 37.5mg ).   INR subtherapeutic, started on lovenox  bridge on admission -INR= 1.5> 1.9 -warfarin 10mg  ordered 5/13 but she missed her dose 5/13  Goal of Therapy:  INR goal 2-3 Monitor platelets by anticoagulation protocol: Yes   Plan:  Warfarin 5mg  todau Daily INR If plans are for discharge, would discharge on he home warfarin regimen Continue Lovenox  120mg  sq q24h (cost for 5 days as outpatient is about $50)  Baxter Limber, PharmD Clinical Pharmacist **Pharmacist phone directory can now be found on [email or web address] ([email or web address]).  Listed under [facility].

## 2023-07-26 NOTE — Progress Notes (Signed)
 According to cardiology note patient's bradycardia is known.  However, patient's HR has been in the 30's most of the night and asymptomatic.

## 2023-07-26 NOTE — Discharge Instructions (Signed)
 See previous note

## 2023-07-26 NOTE — Progress Notes (Addendum)
 Patient Name: Emily Rice Date of Encounter: 07/26/2023 [facility] Cardiologist: Olinda Bertrand, DO   Interval Summary  .    C/o UTI symptoms. Episode of chest pain yesterday afternoon, nothing this morning.   Vital Signs .    Vitals:   07/25/23 1713 07/25/23 2026 07/25/23 2300 07/26/23 0445  BP: (!) 147/81 117/73 (!) 97/55 97/63  Pulse:   (!) 38   Resp: 18 18  18   Temp: 98.1 F (36.7 C) 98 F (36.7 C)  98.4 F (36.9 C)  TempSrc: Oral Oral  Oral  SpO2:      Weight:      Height:        Intake/Output Summary (Last 24 hours) at 07/26/2023 0904 Last data filed at 07/26/2023 0600 Gross per 24 hour  Intake 240 ml  Output --  Net 240 ml      07/23/2023   11:01 PM 07/23/2023    3:01 AM 07/11/2023    1:17 PM  Last 3 Weights  Weight (lbs) 169 lb 4.8 oz 170 lb 13.7 oz 171 lb  Weight (kg) 76.794 kg 77.5 kg 77.565 kg      Telemetry/ECG    Sinus Bradycardia - Personally Reviewed  Physical Exam .    GEN: No acute distress.   Neck: No JVD Cardiac: RRR, no murmurs, rubs, or gallops.  Respiratory: Clear to auscultation bilaterally. GI: Soft, nontender, non-distended  MS: No edema  Assessment & Plan .     63 y.o. female with a hx of AF (on warfarin), HTN, TBI/SAH, PE, DVT (2002), bipolar disorder, anxiety, sickle cell trait who was seen 07/23/2023 for the evaluation of Afib and chest pain at the request of the EDP.    Paroxysmal Atrial fibrillation with RVR Chest pain -- known hx of atrial fibrillation initially dx in 2014. Presented with chest pain and found to be in afib RVR. While in the ED was planned for IV Diltiazem  but converted to sinus rhythm -- hsTn negative 6>>5, noted to be tender to palpation on exam. Suspect MSK pain.  -- has been resumed on Diltiazem , continued on coumadin  (per pharmD) w/lovenox  bridging  -- transitioned from IV amiodarone to amiodarone 200mg  BID x5 days, then drop to 200mg  daily -- HR in the 50-70 range, into the 30s at night,  will reduce Diltiazem  to 120mg  daily  -- has episode of chest pain yesterday, hsTn negative x2, EKG without ST/T wave changes  -- has been following with EP as an outpatient, wants to pursue an ablation. Will ensure follow up arranged    Hx of recurrent DVT/PE -- previously developed DVT while on Eliquis  and has been a coumadin  since that time -- on coumadin , but subtherapeutic INR at 1.5, pharmD to dose. Will need lovenox  bridge at discharge -- follows with PCP for coumadin , instructed she will need appt on Monday for recheck   Headache/Hx of migraines -- negative head CT, had received several rounds of medications without improvement. Seen by neurology, MRI negative  -- per primary   Bipolar disorder -- seen by psych   For questions or updates, please contact [facility] Please consult [email or web address] for contact info under        Signed, Johnie Nailer, NP   ATTENDING ATTESTATION:  After conducting a review of all available clinical information with the care team, interviewing the patient, and performing a physical exam, I agree with the findings and plan described in this note.   GEN: No acute  distress.   HEENT:  MMM, no JVD, no scleral icterus Cardiac: RRR, no murmurs, rubs, or gallops.  Respiratory: Clear to auscultation bilaterally. GI: Soft, nontender, non-distended  MS: No edema; No deformity. Neuro:  Nonfocal  Vasc:  +2 radial pulses  No acute events overnight.  Again, developed bradycardia during sleep which is normal due to nocturnal parasympathetic outsurge in the context of amiodarone and CCB.  Agree with decreasing diltiazem  dose though I do not expect this to change much.  Reassured patient today.  Will sign off, call with ?s.  Alyssa Backbone, MD Pager [phone]

## 2023-07-26 NOTE — Progress Notes (Signed)
 Prescriptions diltizem and amiodorone given to patient.   Pt voiced understanding of discharge instructions and where to pick up additional medications at pharmacy.  IV removed.  Patient transported via wheelchair by RN.

## 2023-07-29 ENCOUNTER — Telehealth: Payer: Self-pay

## 2023-07-29 NOTE — Telephone Encounter (Signed)
 Pt Emily Rice reporting she was hospitalized from 5/12-5/16 for chest pain and palpitations. Hospital notes report pt was in Afib and kept for observation. INRs during hospitalization were subtherapeutic and was 1/9 on d/c. Due to subtherapeutic INRs, pt was placed on Lovenox  to bridge. Pt was also prescribed amiodarone , take 400 mg x 3 days (started 5/16), and then take 200 mg once daily.  Amiodarone  has a major interaction with warfarin and will need to monitor INR closely for several weeks.   Emily Rice

## 2023-07-29 NOTE — Telephone Encounter (Signed)
 Pt returned call. Scheduled for INR check for tomorrow, 8:30, due to pt not having transportation today. Pt reports doing much better since d/c from hospital. Advised of interaction with amiodarone  and increasing INR. Pt denies any s/s of abnormal bruising or bleeding. Advised if any s/s to go to ER. Pt verbalized understanding.  Pt reports she used the last lovenox  syringe yesterday.

## 2023-07-30 ENCOUNTER — Ambulatory Visit (INDEPENDENT_AMBULATORY_CARE_PROVIDER_SITE_OTHER)

## 2023-07-30 DIAGNOSIS — Z7901 Long term (current) use of anticoagulants: Secondary | ICD-10-CM | POA: Diagnosis not present

## 2023-07-30 LAB — POCT INR: INR: 2.4 (ref 2.0–3.0)

## 2023-07-30 MED ORDER — WARFARIN SODIUM 5 MG PO TABS: ORAL_TABLET | ORAL | 1 refills | Status: DC

## 2023-07-30 NOTE — Patient Instructions (Addendum)
 Pre visit review using our clinic review tool, if applicable. No additional management support is needed unless otherwise documented below in the visit note.  Continue 1 tablet daily except take 1 1/2 tablets on Sunday. Recheck in 2 weeks.

## 2023-07-30 NOTE — Progress Notes (Signed)
 Pt was hospitalized from 5/12-5/16 for chest pain and palpitations. Hospital notes report pt was in Afib and kept for observation. INRs during hospitalization were subtherapeutic and was 1/9 on d/c. Due to subtherapeutic INRs, pt was placed on Lovenox  to bridge. Pt was also prescribed amiodarone , take 400 mg x 3 days (started 5/16), and then take 200 mg once daily.  Amiodarone  has a major interaction with warfarin and will need to monitor INR closely for several weeks. Continue 1 tablet daily except take 1 1/2 tablets on Sunday. Recheck in 2 weeks.   Pt is compliant with warfarin management and PCP apts.  Sent in refill of warfarin to requested pharmacy.    Also made a hospital f/u with PCP for pt.

## 2023-08-02 ENCOUNTER — Ambulatory Visit: Payer: Self-pay

## 2023-08-02 NOTE — Telephone Encounter (Signed)
  Chief Complaint: nausea Symptoms: nausea Frequency: intermittent Pertinent Negatives: Patient denies pain, fever, diarrhea, vomiting Disposition: [] ED /[] Urgent Care (no appt availability in office) / [] Appointment(In office/virtual)/ []  [place] [facility]/ [] Home Care/ [] Refused Recommended Disposition /[] [place] [facility]/ [x]  Follow-up with PCP Additional Notes:  Has hospital follow up scheduled 08/06/23. Calling today to request prescription for Zofran  4mg  to [facility].   Since Monday she has been experiencing intermittent nausea, mostly in the morning when she wakes up, this last for about one hour. She has taken Zofran  in the past which she found helpful and tolerated well. Denies other symptoms. Home care advised, aware message will be sent to pcp for review and recommendation. Educated on care advice as documented in protocol, patient verbalized understanding. Discussed reasons to call back.      Copied from CRM [id number]. Topic: Clinical - Medication Question >> Aug 02, 2023  9:45 AM Lajean Pike wrote: Reason for CRM: Patient called in asking if the doctor can provide a script for Zofran . Patient stated to be dealing with daily nausea and she knows that taking Zofran  helped her before. Patient may be contacted through MyChart. Reason for Disposition  Unexplained nausea  Protocols used: Nausea-A-AH

## 2023-08-02 NOTE — Telephone Encounter (Signed)
 Called and informed pt of PCP response, pt verbalized understanding

## 2023-08-02 NOTE — Telephone Encounter (Signed)
 Pt wants zofran  called in

## 2023-08-06 ENCOUNTER — Ambulatory Visit (INDEPENDENT_AMBULATORY_CARE_PROVIDER_SITE_OTHER): Admitting: Family Medicine

## 2023-08-06 ENCOUNTER — Encounter: Payer: Self-pay | Admitting: Family Medicine

## 2023-08-06 ENCOUNTER — Telehealth: Payer: Self-pay | Admitting: Adult Health

## 2023-08-06 VITALS — BP 130/76 | HR 55 | Temp 97.6°F | Ht 63.0 in | Wt 165.0 lb

## 2023-08-06 DIAGNOSIS — F3161 Bipolar disorder, current episode mixed, mild: Secondary | ICD-10-CM | POA: Diagnosis not present

## 2023-08-06 DIAGNOSIS — I480 Paroxysmal atrial fibrillation: Secondary | ICD-10-CM | POA: Diagnosis not present

## 2023-08-06 DIAGNOSIS — G43709 Chronic migraine without aura, not intractable, without status migrainosus: Secondary | ICD-10-CM

## 2023-08-06 DIAGNOSIS — I10 Essential (primary) hypertension: Secondary | ICD-10-CM

## 2023-08-06 DIAGNOSIS — Z7901 Long term (current) use of anticoagulants: Secondary | ICD-10-CM

## 2023-08-06 DIAGNOSIS — Z09 Encounter for follow-up examination after completed treatment for conditions other than malignant neoplasm: Secondary | ICD-10-CM

## 2023-08-06 NOTE — Patient Instructions (Signed)
 Remember to call and schedule your neurology follow-up.

## 2023-08-06 NOTE — Progress Notes (Signed)
 Subjective:     Patient ID: Emily Rice, female    DOB: [birth year], 63 y.o.   MRN: [id number]  Chief Complaint  Patient presents with   Hospitalization Follow-up    D/c 5/16 from [facility] for A-Fib    HPI  History of Present Illness          She is here for hospital discharge follow-up. Admission due to A-fib.  Her husband drove her here today.   Admit date:     07/22/2023  Discharge date: 07/26/23    Instructions:  Follow up with Cardiology for further care of A fib and has appt with Dr. Marven Slimmer next week  FU with neurologist for Migraine tx. FU with Psych for anxiety disorder     Other providers: Cardiologist- Dr. Albert Huff and Dr. Marven Slimmer EP Hematologist- for DVT work up OB/GYN- Dr. Meldon Sport  Psychiatrist- [place], NP for bipolar disorder  Counselor- previously at [facility]  Vascular-  Neurologist- Dr. Gracie Lav  Neuropsychologist - Dr. Elmer Hackney  [facility]- Dr. Allie Area (started Wegovy )   A-fib- on coumadin . INR done this week  Was not cardioverted.  Cardizem  reduced to 120 mg daily   States she plans to schedule with neurology for migraines and tremor.  She has been taking Tylenol  Migraine   Bipolar disorder and anxiety- followed by psychiatrist and counselor  Ativan  increased to TID prn and she has continued BuSpar , Lexapro , Lamictal , Seroquel , Caplyta        Health Maintenance Due  Topic Date Due   Cervical Cancer Screening (HPV/Pap Cotest)  Never done   Pneumococcal Vaccine 24-69 Years old (3 of 3 - PCV) 09/03/2013    Past Medical History:  Diagnosis Date   A-fib (HCC)    Allergy    Anxiety    Bipolar disorder (HCC)    Chondromalacia patellae 12/14/2003   Depression    DVT (deep vein thrombosis) in pregnancy    DVT (deep venous thrombosis) (HCC) 2001 and 2002   DVT (deep venous thrombosis) (HCC)    x 2   Enlarged pancreas 07/02/2023   Fulness of pancreatic head on CT    Esophagitis 06/06/2016   GERD (gastroesophageal reflux disease)     Hemorrhage    Hyperlipidemia    Migraines    Pulmonary embolism (HCC) 2001   S/P cholecystectomy 09/11/2012   Sickle cell anemia (HCC)    Suicidal ideation 12/28/2008   Tremor     Past Surgical History:  Procedure Laterality Date   CHOLECYSTECTOMY  2014   CHOLECYSTECTOMY     HUMERUS IM NAIL Right 03/03/2020   Procedure: INTRAMEDULLARY (IM) NAIL HUMERAL;  Surgeon: Laneta Pintos, MD;  Location: [facility] OR;  Service: Orthopedics;  Laterality: Right;   OVARIAN CYST REMOVAL     TONSILLECTOMY      Family History  Problem Relation Age of Onset   Hypertension Mother    Stroke Father    Hypertension Father    Atrial fibrillation Father    Heart disease Father    Diabetes Sister    CAD Brother    Anxiety disorder Daughter    ADD / ADHD Son    Depression Son    Hypertension Maternal Uncle    Liver disease Neg Hx    Esophageal cancer Neg Hx    Colon cancer Neg Hx     Social History   Socioeconomic History   Marital status: Married    Spouse name: Foster Im   Number of children: 2   Years of  education: college   Highest education level: Master's degree (e.g., MA, MS, MEng, MEd, MSW, MBA)  Occupational History   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   Right-handed.   No daily use of caffeine .   Lives with husband.-2025      Social Drivers of Health   Financial Resource  Strain: Low Risk  (03/29/2023)   Overall Financial Resource Strain (CARDIA)    Difficulty of Paying Living Expenses: Not hard at all  Food Insecurity: No Food Insecurity (07/24/2023)   Hunger Vital Sign    Worried About Running Out of Food in the Last Year: Never true    Ran Out of Food in the Last Year: Never true  Transportation Needs: No Transportation Needs (07/24/2023)   PRAPARE - Administrator, Civil Service (Medical): No    Lack of Transportation (Non-Medical): No  Physical Activity: Sufficiently Active (03/29/2023)   Exercise Vital Sign    Days of Exercise per Week: 5 days    Minutes of Exercise per Session: 60 min  Stress: Stress Concern Present (03/29/2023)   [employer] of Occupational Health - Occupational Stress Questionnaire    Feeling of Stress : To some extent  Social Connections: Patient Declined (07/24/2023)   Social Connection and Isolation Panel [NHANES]    Frequency of Communication with Friends and Family: Patient declined    Frequency of Social Gatherings with Friends and Family: Patient declined    Attends Religious Services: Patient declined    Database administrator or Organizations: Patient declined    Attends Banker Meetings: Patient declined    Marital Status: Patient declined  Intimate Partner Violence: Not At Risk (07/24/2023)   Humiliation, Afraid, Rape, and Kick questionnaire    Fear of Current or Ex-Partner: No    Emotionally Abused: No    Physically Abused: No    Sexually Abused: No    Outpatient Medications Prior to Visit  Medication Sig Dispense Refill   acetaminophen  (TYLENOL ) 325 MG tablet Take 2 tablets (650 mg total) by mouth every 6 (six) hours as needed for mild pain or moderate pain.     amiodarone  (PACERONE ) 200 MG tablet Take 1 tablet (200 mg total) twice a day for 3 days then take 1 tablet (200 mg total) once daily. 60 tablet 0   aspirin -acetaminophen -caffeine  (EXCEDRIN  MIGRAINE) 250-250-65 MG tablet Take 2  tablets by mouth 2 (two) times daily as needed for migraine.     busPIRone  (BUSPAR ) 30 MG tablet Take 1 tablet (30 mg total) by mouth 2 (two) times daily. 60 tablet 1   calcium  carbonate (TUMS) 500 MG chewable tablet Chew 1 tablet (200 mg of elemental calcium  total) by mouth daily. 90 tablet 3   CAPLYTA  42 MG capsule TAKE 1 CAPSULE EVERY  DAY 30 capsule 11   carbidopa -levodopa  (SINEMET  IR) 25-100 MG tablet Take one tablet in the am and one tablet in the pm (Patient taking differently: Take 1 tablet by mouth 3 (three) times daily. Take one tablet in the am and one tablet in the pm) 60 tablet 4   cetirizine (ZYRTEC) 10 MG tablet Take 10 mg by mouth daily as needed for allergies.     Cholecalciferol  75 MCG (3000 UT) TABS Take 3,000 Units by mouth daily. 30 tablet 0   diltiazem  (CARDIZEM  CD) 120 MG 24 hr capsule Take 1 capsule (120 mg total) by mouth daily. 30 capsule 1   diphenhydrAMINE  (BENADRYL  ALLERGY EXTRA STR) 50 MG tablet Take 1 tablet (50 mg total) by mouth once for 1 dose. Take 1 tablet 1 hour prior to CT scan. 1 tablet 0   divalproex  (DEPAKOTE ) 500 MG DR tablet Take 1 tablet (500 mg total) by mouth at bedtime as needed. 20 tablet 0   escitalopram  (LEXAPRO ) 10 MG tablet TAKE 1 TABLET AT BEDTIME (Patient taking differently: Take 10 mg by mouth every morning.) 90 tablet 3   fexofenadine (ALLEGRA) 180 MG tablet Take 180 mg by mouth as needed for allergies or rhinitis.     fluticasone  (FLONASE ) 50 MCG/ACT nasal spray Place 1 spray into both nostrils daily as needed for allergies.      lamoTRIgine  (LAMICTAL ) 200 MG tablet TAKE 1 TABLET TWICE DAILY 180 tablet 0   LORazepam  (ATIVAN ) 0.5 MG tablet Take 0.5 mg by mouth 3 (three) times daily.     metFORMIN  (GLUCOPHAGE -XR) 500 MG 24 hr tablet Take 2 tablets (1,000 mg total) by mouth 2 (two) times daily with a meal. 120 tablet 1   Multiple Vitamin (MULTIVITAMIN WITH MINERALS) TABS tablet Take 1 tablet by mouth daily. (Patient taking differently: Take 1  tablet by mouth daily. Centrum 50+)     pantoprazole  (PROTONIX ) 40 MG tablet Take 1 tablet (40 mg total) by mouth 2 (two) times daily. 30 tablet 0   polyethylene glycol (MIRALAX  / GLYCOLAX ) 17 g packet Take 17 g by mouth 2 (two) times daily. (Patient taking differently: Take 17 g by mouth daily as needed for mild constipation.) 14 each 0   QUEtiapine  (SEROQUEL ) 50 MG tablet TAKE 1 TABLET AT BEDTIME 90 tablet 1   warfarin (COUMADIN ) 5 MG tablet TAKE 1 TABLET (5 MG) BY MOUTH DAILY EXCEPT TAKE 1 1/2 TABLETS (7.5 MG) ON SUNDAY OR AS DIRECTED BY ANTICOAGULATION CLINIC 100 tablet 1   enoxaparin  (LOVENOX ) 120 MG/0.8ML injection Inject 0.8 mLs (120 mg total) into the skin daily for 3 days. 2.4 mL 0   No facility-administered medications prior to visit.    Allergies  Allergen Reactions   Hydromorphone  Other (See Comments)    Stroke-like symptoms. treated with Narcan; Tolerated Percocet in the past.   Iodinated Contrast Media Other (See Comments)    Reaction not fully known   Prochlorperazine Swelling and Other (See Comments)    Compazine - tongue swelling    Review of Systems  Constitutional:  Positive for malaise/fatigue. Negative for chills and fever.  Eyes:  Negative for blurred vision, double vision and photophobia.  Respiratory:  Negative for shortness of breath.   Cardiovascular:  Negative for chest pain, palpitations and leg swelling.  Gastrointestinal:  Negative for abdominal pain, constipation, diarrhea, nausea and vomiting.  Genitourinary:  Negative for dysuria, frequency and urgency.  Musculoskeletal:  Negative for falls.  Neurological:  Negative for dizziness, tingling, focal weakness and  headaches.  Psychiatric/Behavioral:  Positive for depression.        Objective:     Physical Exam Constitutional:      General: She is not in acute distress.    Appearance: She is not ill-appearing.  HENT:     Mouth/Throat:     Mouth: Mucous membranes are moist.  Eyes:     Extraocular  Movements: Extraocular movements intact.     Conjunctiva/sclera: Conjunctivae normal.  Cardiovascular:     Rate and Rhythm: Normal rate and regular rhythm.  Pulmonary:     Effort: Pulmonary effort is normal.     Breath sounds: Normal breath sounds.  Musculoskeletal:     Cervical back: Normal range of motion and neck supple.     Right lower leg: No edema.     Left lower leg: No edema.  Skin:    General: Skin is warm and dry.  Neurological:     General: No focal deficit present.     Mental Status: She is alert and oriented to person, place, and time.     Motor: No weakness.     Coordination: Coordination normal.  Psychiatric:        Mood and Affect: Mood normal.        Behavior: Behavior normal.        Thought Content: Thought content normal.      BP 130/76 (BP Location: Left Arm, Patient Position: Sitting)   Pulse (!) 55   Temp 97.6 F (36.4 C) (Temporal)   Ht 5\' 3"  (1.6 m)   Wt 165 lb (74.8 kg)   LMP  (LMP Unknown)   SpO2 98%   BMI 29.23 kg/m  Wt Readings from Last 3 Encounters:  08/06/23 165 lb (74.8 kg)  07/23/23 169 lb 4.8 oz (76.8 kg)  07/11/23 171 lb (77.6 kg)       Assessment & Plan:   Problem List Items Addressed This Visit     Benign essential hypertension   Bipolar disorder (HCC)   Chronic migraine w/o aura w/o status migrainosus, not intractable   Paroxysmal atrial fibrillation (HCC)   Other Visit Diagnoses       Long term current use of anticoagulant therapy    -  Primary     Hospital discharge follow-up          Reviewed hospital discharge summary, results and reconciled medications.  She will continue close follow up with cardiology, neurology, and psychiatry.  No changes to current treatment plan.  Follow up here in 6 months.   I am having Veatrice L. Theotis Flake maintain her fluticasone , fexofenadine, acetaminophen , Cholecalciferol , multivitamin with minerals, polyethylene glycol, carbidopa -levodopa , cetirizine, diphenhydrAMINE , escitalopram ,  Caplyta , QUEtiapine , metFORMIN , busPIRone , aspirin -acetaminophen -caffeine , lamoTRIgine , diltiazem , amiodarone , pantoprazole , enoxaparin , calcium  carbonate, divalproex , warfarin, and LORazepam .  No orders of the defined types were placed in this encounter.

## 2023-08-06 NOTE — Telephone Encounter (Signed)
 Pt LVM @ 2:44p stating she needed to get a message to [place] before he visit on 6/19.

## 2023-08-07 NOTE — Telephone Encounter (Signed)
 LVM to [facility]

## 2023-08-07 NOTE — Telephone Encounter (Signed)
 Patient would like to get in sooner with [facility]. She wants to keep the 6/19 appt, but is ok with doing a MyChart visit as soon as she can.

## 2023-08-08 ENCOUNTER — Telehealth: Admitting: Adult Health

## 2023-08-08 NOTE — Telephone Encounter (Signed)
 Pt called back at 11:56a and scheduled appt for 6/4

## 2023-08-13 ENCOUNTER — Ambulatory Visit: Payer: Self-pay

## 2023-08-13 NOTE — Telephone Encounter (Signed)
 Called pt as she was supposed to f/u with neuro per Vickie last ov notes, pt states she only could get soonest appt available for July 31st. She is concerned because that appt is far away and she keeps calling to see if they can see her sooner but at this time they don't have any available. Pt would like to know if something could be sent to help her migraines until seen by neuro?

## 2023-08-13 NOTE — Telephone Encounter (Signed)
 Chief Complaint: migraine Symptoms: headache, nausea, light sensitivity, dizziness Frequency: diagnosed w/ migraines, H/A for 3 hrs Pertinent Negatives: Patient denies neck stiffness, fever, CP, SOB Disposition: [] ED /[] Urgent Care (no appt availability in office) / [x] Appointment(In office/virtual)/ []  [place] [facility]/ [] Home Care/ [x] Refused Recommended Disposition /[] [place] [facility]/ []  Follow-up with PCP Additional Notes: Pt reports migraine. Pt states she had a migraine 5/27 in the office but at that time Tylenol  migraine was helping to relieve her symptoms. Pt states that medication is no longer taking effect. Pt endorses nausea and states she vomited 2x last week and has not vomited since. Pt endorses light sensitivity. Pt reports 7/10 R temporal pain that radiates throughout. Pt endorses dizziness with walking that requires her to sit. Denies neck stiffness, fever. Denies CP or SOB. RN advised pt she should be seen within 4 hrs and offered her an appt. Pt declined, states she has no transport today. RN advised pt RN would relay symptoms to the office for follow-up. RN advised pt if she becomes more dizzy, falls, begins vomiting, has visual changes, CP, or SOB she needs to go to the ED. Pt verbalized understanding.      Copied from CRM [id number]. Topic: Clinical - Red Word Triage >> Aug 13, 2023 10:37 AM [place] J wrote: [facility] that prompted transfer to Nurse Triage: Patient has been having worsening and more consistent migraines that are not able to be controlled using over the counter medication. Reason for Disposition  [1] SEVERE headache (e.g., excruciating) AND [2] not improved after 2 hours of pain medicine  Answer Assessment - Initial Assessment Questions 1. LOCATION: "Where does it hurt?"      R side temporal radiating to the rest of her head 2. ONSET: "When did the headache start?" (Minutes, hours or days)      3 hrs, had one on 5/27 as well  3. PATTERN: "Does the  pain come and go, or has it been constant since it started?"     Migraines last longer 4. SEVERITY: "How bad is the pain?" and "What does it keep you from doing?"  (e.g., Scale 1-10; mild, moderate, or severe)   - MILD (1-3): doesn't interfere with normal activities    - MODERATE (4-7): interferes with normal activities or awakens from sleep    - SEVERE (8-10): excruciating pain, unable to do any normal activities        7/10  5. RECURRENT SYMPTOM: "Have you ever had headaches before?" If Yes, ask: "When was the last time?" and "What happened that time?"      Yes - hx of migraines  6. CAUSE: "What do you think is causing the headache?"     Migraines  7. MIGRAINE: "Have you been diagnosed with migraine headaches?" If Yes, ask: "Is this headache similar?"      Yes  8. HEAD INJURY: "Has there been any recent injury to the head?"      No  9. OTHER SYMPTOMS: "Do you have any other symptoms?" (fever, stiff neck, eye pain, sore throat, cold symptoms)    Seen last 5/27 in the office, had a migraine then, at that time Tylenol  migraine medication had been taking the edge off the migraines. Nausea was still there too.   Pt states headaches are more severe, pt states in the AM she has the migraine, nausea has worsened, more persistent. Pt vomited twice last week, none since. Past 2-3 yrs pt has not had migraines, now they have  returned. Light sensitivity. Denies neck stiffness, fever. Endorses dizziness with walking, requiring her to sit. Denies CP and SOB at this time but states she has experienced them before w/ Afib.  Protocols used: [facility]

## 2023-08-14 ENCOUNTER — Encounter: Payer: Self-pay | Admitting: Adult Health

## 2023-08-14 ENCOUNTER — Ambulatory Visit: Attending: Cardiology | Admitting: Cardiology

## 2023-08-14 ENCOUNTER — Other Ambulatory Visit: Payer: Self-pay

## 2023-08-14 ENCOUNTER — Telehealth: Admitting: Adult Health

## 2023-08-14 VITALS — BP 138/80 | HR 63 | Ht 63.0 in | Wt 168.0 lb

## 2023-08-14 DIAGNOSIS — F411 Generalized anxiety disorder: Secondary | ICD-10-CM | POA: Diagnosis not present

## 2023-08-14 DIAGNOSIS — I480 Paroxysmal atrial fibrillation: Secondary | ICD-10-CM

## 2023-08-14 DIAGNOSIS — F09 Unspecified mental disorder due to known physiological condition: Secondary | ICD-10-CM | POA: Diagnosis not present

## 2023-08-14 DIAGNOSIS — F310 Bipolar disorder, current episode hypomanic: Secondary | ICD-10-CM | POA: Diagnosis not present

## 2023-08-14 DIAGNOSIS — G4700 Insomnia, unspecified: Secondary | ICD-10-CM

## 2023-08-14 DIAGNOSIS — Z86718 Personal history of other venous thrombosis and embolism: Secondary | ICD-10-CM

## 2023-08-14 MED ORDER — LORAZEPAM 0.5 MG PO TABS: 0.5000 mg | ORAL_TABLET | Freq: Three times a day (TID) | ORAL | 1 refills | Status: DC

## 2023-08-14 MED ORDER — BUSPIRONE HCL 30 MG PO TABS: 30.0000 mg | ORAL_TABLET | Freq: Two times a day (BID) | ORAL | 1 refills | Status: DC

## 2023-08-14 NOTE — Patient Instructions (Addendum)
 Medication Instructions:  Your physician recommends that you continue on your current medications as directed. Please refer to the Current Medication list given to you today.  *If you need a refill on your cardiac medications before your next appointment, please call your pharmacy*  Lab Work: TODAY: CMET, TSH, T4  BMET and CBC - you may go to any [facility] location to have these drawn within 30 days of your procedure  Testing/Procedures: Cardiac CT Your physician has requested that you have cardiac CT. Cardiac computed tomography (CT) is a painless test that uses an x-ray machine to take clear, detailed pictures of your heart. For further information please visit [email or web address]  We will call you to schedule your CT scan. It will be done about three weeks prior to your ablation.  Ablation Your physician has recommended that you have an ablation. Catheter ablation is a medical procedure used to treat some cardiac arrhythmias (irregular heartbeats). During catheter ablation, a long, thin, flexible tube is put into a blood vessel in your groin (upper thigh), or neck. This tube is called an ablation catheter. It is then guided to your heart through the blood vessel. Radio frequency waves destroy small areas of heart tissue where abnormal heartbeats may cause an arrhythmia to start.  You are scheduled for Atrial Fibrillation Ablation on Thursday, August 21 with Dr. Harvie Liner.Please arrive at the Main Entrance A at [facility]: [address] [place], [place] [address] at 8:00 AM    Follow-Up: At [facility], you and your health needs are our priority.  As part of our continuing mission to provide you with exceptional heart care, we have created designated Provider Care Teams.  These Care Teams include your primary Cardiologist (physician) and Advanced Practice Providers (APPs -  Physician Assistants and Nurse Practitioners) who all work together to provide you with the care  you need, when you need it.   Your next appointment:   We will contact you about your post-procedure follow up appointments.
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
## 2023-08-14 NOTE — Progress Notes (Signed)
 Electrophysiology Office Follow up Visit Note:    Date:  08/14/2023   ID:  Emily Rice, DOB Feb 26, 1961, MRN [id number]  PCP:  Abram Abraham, NP-C  [facility] Cardiologist:  Olinda Bertrand, DO  [facility] Electrophysiologist:  Boyce Byes, MD    Interval History:     Emily Rice is a 63 y.o. female who presents for a follow up visit.   She last saw Brandi in June 28, 2023.  The patient has a history of atrial fibrillation, hypertension, TBI, recurrent DVT, sickle cell trait, bipolar disorder.  She has a history of symptomatic atrial fibrillation and reported episodes 3-4 times per week at her most recent appointment with Brandi.  She previously expressed interest in pursuing catheter ablation.  I most recently saw the patient in July 2024.  She was previously scheduled for catheter ablation but this had to be canceled because of concern for a periodontal infection.  She was seen in the hospital 11/22/2023 for A-fib with RVR.  She was started on IV amiodarone  and transition to oral upon discharge.  She describes a migraine headache today.  She also describes more frequent episodes of atrial fibrillation that are quite symptomatic.  She feels some GI distress even when she is in A-fib.  She continues to take amiodarone  and Coumadin .  Her INRs have been therapeutic recently.  She is interested in pursuing catheter ablation.       Past medical, surgical, social and family history were reviewed.  ROS:   Please see the history of present illness.    All other systems reviewed and are negative.  EKGs/Labs/Other Studies Reviewed:    The following studies were reviewed today:  10/22/2023 EKG shows atrial fibrillation with a ventricular rate of 91 bpm   EKG Interpretation Date/Time:  Wednesday August 14 2023 13:05:05 EDT Ventricular Rate:  63 PR Interval:  140 QRS Duration:  80 QT Interval:  414 QTC Calculation: 423 R Axis:   69  Text Interpretation: Normal  sinus rhythm Confirmed by Harvie Liner [phone]) on 08/14/2023 1:14:19 PM    Physical Exam:    VS:  BP 138/80   Pulse 63   Ht 5\' 3"  (1.6 m)   Wt 168 lb (76.2 kg)   LMP  (LMP Unknown)   SpO2 93%   BMI 29.76 kg/m     Wt Readings from Last 3 Encounters:  08/14/23 168 lb (76.2 kg)  08/06/23 165 lb (74.8 kg)  07/23/23 169 lb 4.8 oz (76.8 kg)     GEN: no distress CARD: RRR, No MRG RESP: No IWOB. CTAB.      ASSESSMENT:    1. Paroxysmal atrial fibrillation (HCC)   2. History of DVT (deep vein thrombosis)    PLAN:    In order of problems listed above:  #Atrial fibrillation #High risk med monitoring-amiodarone  Paroxysmal.  Symptomatic.  Previously was scheduled for catheter ablation but this had to be canceled because of dental infection concerns.  She would like to proceed with this treatment strategy.  I discussed the catheter ablation procedure again during today's visit including the risks, recovery and likelihood of success and she wishes to proceed with scheduling.  Update CMP, TSH and free T4 today.  Discussed treatment options today for AF including antiarrhythmic drug therapy and ablation. Discussed risks, recovery and likelihood of success with each treatment strategy. Risk, benefits, and alternatives to EP study and ablation for afib were discussed. These risks include but are not limited to  stroke, bleeding, vascular damage, tamponade, perforation, damage to the esophagus, lungs, phrenic nerve and other structures, pulmonary vein stenosis, worsening renal function, coronary vasospasm and death.  Discussed potential need for repeat ablation procedures and antiarrhythmic drugs after an initial ablation. The patient understands these risk and wishes to proceed.  We will therefore proceed with catheter ablation at the next available time.  Carto, ICE, anesthesia are requested for the procedure.  Will also obtain CT PV protocol prior to the procedure to exclude LAA thrombus and  further evaluate atrial anatomy.  #DVT history Continue OAC   Signed, Harvie Liner, MD, [facility], [facility] 08/14/2023 1:27 PM    Electrophysiology [facility]

## 2023-08-15 ENCOUNTER — Other Ambulatory Visit: Payer: Self-pay

## 2023-08-15 ENCOUNTER — Telehealth: Payer: Self-pay | Admitting: Neurology

## 2023-08-15 ENCOUNTER — Telehealth: Payer: Self-pay | Admitting: Cardiology

## 2023-08-15 ENCOUNTER — Emergency Department (HOSPITAL_COMMUNITY)

## 2023-08-15 ENCOUNTER — Encounter (HOSPITAL_COMMUNITY): Payer: Self-pay

## 2023-08-15 ENCOUNTER — Emergency Department (HOSPITAL_COMMUNITY)
Admission: EM | Admit: 2023-08-15 | Discharge: 2023-08-15 | Disposition: A | Discharge status: Home / Self Care | Attending: Emergency Medicine | Admitting: Emergency Medicine

## 2023-08-15 DIAGNOSIS — R29818 Other symptoms and signs involving the nervous system: Secondary | ICD-10-CM | POA: Diagnosis not present

## 2023-08-15 DIAGNOSIS — R531 Weakness: Secondary | ICD-10-CM | POA: Diagnosis not present

## 2023-08-15 DIAGNOSIS — Z79899 Other long term (current) drug therapy: Secondary | ICD-10-CM | POA: Insufficient documentation

## 2023-08-15 DIAGNOSIS — I480 Paroxysmal atrial fibrillation: Secondary | ICD-10-CM

## 2023-08-15 DIAGNOSIS — G43909 Migraine, unspecified, not intractable, without status migrainosus: Secondary | ICD-10-CM

## 2023-08-15 DIAGNOSIS — R000 Tachycardia, unspecified: Secondary | ICD-10-CM | POA: Diagnosis not present

## 2023-08-15 DIAGNOSIS — Z7901 Long term (current) use of anticoagulants: Secondary | ICD-10-CM | POA: Diagnosis not present

## 2023-08-15 LAB — COMPREHENSIVE METABOLIC PANEL WITH GFR
ALT: 16 IU/L (ref 0–32)
ALT: 9 U/L (ref 0–44)
AST: 24 IU/L (ref 0–40)
AST: 26 U/L (ref 15–41)
Albumin: 4.8 g/dL (ref 3.8–4.9)
Albumin: 4.2 g/dL (ref 3.5–5.0)
Alkaline Phosphatase: 78 IU/L (ref 44–121)
Alkaline Phosphatase: 55 U/L (ref 38–126)
Anion gap: 15 (ref 5–15)
BUN/Creatinine Ratio: 14 (ref 12–28)
BUN: 14 mg/dL (ref 8–27)
BUN: 9 mg/dL (ref 6–20)
Bilirubin Total: 0.3 mg/dL (ref 0.0–1.2)
CO2: 21 mmol/L (ref 20–29)
CO2: 17 mmol/L — ABNORMAL LOW (ref 22–32)
Calcium: 9.6 mg/dL (ref 8.7–10.3)
Calcium: 9.1 mg/dL (ref 8.9–10.3)
Chloride: 107 mmol/L — ABNORMAL HIGH (ref 96–106)
Chloride: 108 mmol/L (ref 98–111)
Creatinine, Ser: 1 mg/dL (ref 0.57–1.00)
Creatinine, Ser: 0.94 mg/dL (ref 0.44–1.00)
GFR, Estimated: >60 mL/min (ref >60)
Globulin, Total: 2.8 g/dL (ref 1.5–4.5)
Glucose, Bld: 146 mg/dL — ABNORMAL HIGH (ref 70–99)
Glucose: 83 mg/dL (ref 70–99)
Potassium: 4.3 mmol/L (ref 3.5–5.2)
Potassium: 3.4 mmol/L — ABNORMAL LOW (ref 3.5–5.1)
Sodium: 146 mmol/L — ABNORMAL HIGH (ref 134–144)
Sodium: 140 mmol/L (ref 135–145)
Total Bilirubin: 0.7 mg/dL (ref 0.0–1.2)
Total Protein: 7.6 g/dL (ref 6.0–8.5)
Total Protein: 7 g/dL (ref 6.5–8.1)
eGFR: 64 mL/min/{1.73_m2} (ref >59)

## 2023-08-15 LAB — RAPID URINE DRUG SCREEN, HOSP PERFORMED
Amphetamines: NOT DETECTED
Barbiturates: NOT DETECTED
Benzodiazepines: POSITIVE — AB
Cocaine: NOT DETECTED
Opiates: NOT DETECTED
Tetrahydrocannabinol: NOT DETECTED

## 2023-08-15 LAB — I-STAT CHEM 8, ED
BUN: 11 mg/dL (ref 6–20)
BUN: 11 mg/dL (ref 6–20)
BUN: 9 mg/dL (ref 6–20)
Calcium, Ion: 0.92 mmol/L — ABNORMAL LOW (ref 1.15–1.40)
Calcium, Ion: 0.96 mmol/L — ABNORMAL LOW (ref 1.15–1.40)
Calcium, Ion: 1.1 mmol/L — ABNORMAL LOW (ref 1.15–1.40)
Chloride: 107 mmol/L (ref 98–111)
Chloride: 108 mmol/L (ref 98–111)
Chloride: 108 mmol/L (ref 98–111)
Creatinine, Ser: 0.9 mg/dL (ref 0.44–1.00)
Creatinine, Ser: 0.9 mg/dL (ref 0.44–1.00)
Creatinine, Ser: 1 mg/dL (ref 0.44–1.00)
Glucose, Bld: 127 mg/dL — ABNORMAL HIGH (ref 70–99)
Glucose, Bld: 139 mg/dL — ABNORMAL HIGH (ref 70–99)
Glucose, Bld: 146 mg/dL — ABNORMAL HIGH (ref 70–99)
HCT: 39 % (ref 36.0–46.0)
HCT: 39 % (ref 36.0–46.0)
HCT: 41 % (ref 36.0–46.0)
Hemoglobin: 13.3 g/dL (ref 12.0–15.0)
Hemoglobin: 13.3 g/dL (ref 12.0–15.0)
Hemoglobin: 13.9 g/dL (ref 12.0–15.0)
Potassium: 3.3 mmol/L — ABNORMAL LOW (ref 3.5–5.1)
Potassium: 6.5 mmol/L (ref 3.5–5.1)
Potassium: 7.7 mmol/L (ref 3.5–5.1)
Sodium: 137 mmol/L (ref 135–145)
Sodium: 139 mmol/L (ref 135–145)
Sodium: 143 mmol/L (ref 135–145)
TCO2: 21 mmol/L — ABNORMAL LOW (ref 22–32)
TCO2: 23 mmol/L (ref 22–32)
TCO2: 24 mmol/L (ref 22–32)

## 2023-08-15 LAB — CBC
HCT: 36.8 % (ref 36.0–46.0)
Hemoglobin: 12.2 g/dL (ref 12.0–15.0)
MCH: 26.9 pg (ref 26.0–34.0)
MCHC: 33.2 g/dL (ref 30.0–36.0)
MCV: 81.1 fL (ref 80.0–100.0)
Platelets: 164 10*3/uL (ref 150–400)
RBC: 4.54 MIL/uL (ref 3.87–5.11)
RDW: 18.1 % — ABNORMAL HIGH (ref 11.5–15.5)
WBC: 8.4 10*3/uL (ref 4.0–10.5)
nRBC: 0 % (ref 0.0–0.2)

## 2023-08-15 LAB — PROTIME-INR
INR: 1.9 — ABNORMAL HIGH (ref 0.8–1.2)
Prothrombin Time: 21.9 s — ABNORMAL HIGH (ref 11.4–15.2)

## 2023-08-15 LAB — DIFFERENTIAL
Abs Immature Granulocytes: 0.02 10*3/uL (ref 0.00–0.07)
Basophils Absolute: 0 10*3/uL (ref 0.0–0.1)
Basophils Relative: 0 %
Eosinophils Absolute: 0.1 10*3/uL (ref 0.0–0.5)
Eosinophils Relative: 1 %
Immature Granulocytes: 0 %
Lymphocytes Relative: 30 %
Lymphs Abs: 2.5 10*3/uL (ref 0.7–4.0)
Monocytes Absolute: 0.4 10*3/uL (ref 0.1–1.0)
Monocytes Relative: 5 %
Neutro Abs: 5.3 10*3/uL (ref 1.7–7.7)
Neutrophils Relative %: 64 %

## 2023-08-15 LAB — I-STAT CG4 LACTIC ACID, ED: Lactic Acid, Venous: 3.4 mmol/L (ref 0.5–1.9)

## 2023-08-15 LAB — TSH: TSH: 2.26 u[IU]/mL (ref 0.450–4.500)

## 2023-08-15 LAB — ETHANOL: Alcohol, Ethyl (B): <15 mg/dL (ref <15)

## 2023-08-15 LAB — T4, FREE: Free T4: 1.27 ng/dL (ref 0.82–1.77)

## 2023-08-15 LAB — APTT: aPTT: 33 s (ref 24–36)

## 2023-08-15 MED ORDER — METOCLOPRAMIDE HCL 5 MG/ML IJ SOLN
10.0000 mg | Freq: Once | INTRAMUSCULAR | Status: AC
  Administered 2023-08-15: 10 mg via INTRAVENOUS
  Filled 2023-08-15: qty 2

## 2023-08-15 MED ORDER — MIDAZOLAM HCL 2 MG/2ML IJ SOLN
2.0000 mg | Freq: Once | INTRAMUSCULAR | Status: DC
  Filled 2023-08-15: qty 2

## 2023-08-15 MED ORDER — DIPHENHYDRAMINE HCL 25 MG PO CAPS: 50.0000 mg | ORAL_CAPSULE | Freq: Once | ORAL | Status: DC

## 2023-08-15 MED ORDER — MIDAZOLAM HCL 2 MG/2ML IJ SOLN
1.0000 mg | Freq: Once | INTRAMUSCULAR | Status: AC | PRN
  Administered 2023-08-15: 1 mg via INTRAVENOUS
  Filled 2023-08-15: qty 2

## 2023-08-15 MED ORDER — DIPHENHYDRAMINE HCL 50 MG/ML IJ SOLN
25.0000 mg | Freq: Once | INTRAMUSCULAR | Status: AC
  Administered 2023-08-15: 25 mg via INTRAVENOUS
  Filled 2023-08-15: qty 1

## 2023-08-15 MED ORDER — DROPERIDOL 2.5 MG/ML IJ SOLN
1.2500 mg | Freq: Once | INTRAMUSCULAR | Status: AC
  Administered 2023-08-15: 1.25 mg via INTRAVENOUS
  Filled 2023-08-15: qty 2

## 2023-08-15 MED ORDER — METHYLPREDNISOLONE SODIUM SUCC 40 MG IJ SOLR: 40.0000 mg | Freq: Once | INTRAMUSCULAR | Status: DC

## 2023-08-15 MED ORDER — DIPHENHYDRAMINE HCL 50 MG/ML IJ SOLN: 50.0000 mg | Freq: Once | INTRAMUSCULAR | Status: DC

## 2023-08-15 MED ORDER — MIDAZOLAM HCL 2 MG/2ML IJ SOLN
1.0000 mg | Freq: Once | INTRAMUSCULAR | Status: AC
  Administered 2023-08-15: 1 mg via INTRAVENOUS
  Filled 2023-08-15: qty 2

## 2023-08-15 NOTE — Telephone Encounter (Signed)
 I have advised the pt that Dr Marven Slimmer has not had a chance to review her lab results and we will follow up with her once he results them to us .   She is asking if she needs to adjust her Amiodarone  and Diltiazem  based on what he sees on her results.   Will route for his review.

## 2023-08-15 NOTE — Consult Note (Signed)
 NEUROLOGY CONSULT NOTE   Date of service: August 15, 2023 Patient Name: Emily Rice MRN:  [id number] DOB:  07-30-1960 Chief Complaint: stroke code Requesting Provider: Albertus Hughs, DO  History of Present Illness  Emily Rice is a 63 y.o. female with hx of bipolar disorder, A-fib and DVT on warfarin, migraines.  She presented as a code stroke for right-sided weakness.  Last known well was at 10:00 when she developed a severe headache consistent with her typical migraines.  She woke up at 1130 and at that point had a persistent headache as well as right-sided weakness.  Initially she was unable to hold her right upper extremity or right lower extremity up against gravity.  Subsequently she was not able to lift any extremity against gravity.  However with significant encouragement and coaching she was then able to lift all extremities up and hold them without drift.  She got an NIH stroke scale 1 for sensory deficit on the right side.  TNK was not administered due to presentation outside the window.  And was not consistent with LVO therefore CTA was not completed as part of stroke process.  She also initially complained of right leg pain however this revolved during stroke code.  LKW: 1000 Modified rankin score: 0-Completely asymptomatic and back to baseline post- stroke IV Thrombolysis: no outside of window NIHSS = 1 for sensory deficit  ROS   Comprehensive ROS performed and pertinent positives documented in HPI   Past History   Past Medical History:  Diagnosis Date   A-fib (HCC)    Allergy    Anxiety    Bipolar disorder (HCC)    Chondromalacia patellae 12/14/2003   Depression    DVT (deep vein thrombosis) in pregnancy    DVT (deep venous thrombosis) (HCC) 2001 and 2002   DVT (deep venous thrombosis) (HCC)    x 2   Enlarged pancreas 07/02/2023   Fulness of pancreatic head on CT    Esophagitis 06/06/2016   GERD (gastroesophageal reflux disease)    Hemorrhage    Hyperlipidemia     Migraines    Pulmonary embolism (HCC) 2001   S/P cholecystectomy 09/11/2012   Sickle cell anemia (HCC)    Suicidal ideation 12/28/2008   Tremor     Past Surgical History:  Procedure Laterality Date   CHOLECYSTECTOMY  2014   CHOLECYSTECTOMY     HUMERUS IM NAIL Right 03/03/2020   Procedure: INTRAMEDULLARY (IM) NAIL HUMERAL;  Surgeon: Laneta Pintos, MD;  Location: [facility] OR;  Service: Orthopedics;  Laterality: Right;   OVARIAN CYST REMOVAL     TONSILLECTOMY      Family History: Family History  Problem Relation Age of Onset   Hypertension Mother    Stroke Father    Hypertension Father    Atrial fibrillation Father    Heart disease Father    Diabetes Sister    CAD Brother    Anxiety disorder Daughter    ADD / ADHD Son    Depression Son    Hypertension Maternal Uncle    Liver disease Neg Hx    Esophageal cancer Neg Hx    Colon cancer Neg Hx     Social History  reports that she has never smoked. She has never used smokeless tobacco. She reports that she does not currently use drugs after having used the following drugs: Benzodiazepines. Frequency: 7.00 times per week. She reports that she does not drink alcohol.  Allergies  Allergen Reactions   Hydromorphone  Other (  See Comments)    Stroke-like symptoms. treated with Narcan; Tolerated Percocet in the past.   Iodinated Contrast Media Other (See Comments)    Reaction not fully known   Prochlorperazine Swelling and Other (See Comments)    Compazine - tongue swelling    Medications   Current Facility-Administered Medications:    midazolam (VERSED) injection 2 mg, 2 mg, Intravenous, Once, Albertus Hughs, DO  Current Outpatient Medications:    acetaminophen  (TYLENOL ) 325 MG tablet, Take 2 tablets (650 mg total) by mouth every 6 (six) hours as needed for mild pain or moderate pain., Disp: , Rfl:    amiodarone  (PACERONE ) 200 MG tablet, Take 1 tablet (200 mg total) twice a day for 3 days then take 1 tablet (200 mg total) once  daily. (Patient taking differently: Take 200 mg by mouth daily.), Disp: 60 tablet, Rfl: 0   aspirin -acetaminophen -caffeine  (EXCEDRIN  MIGRAINE) 250-250-65 MG tablet, Take 2 tablets by mouth 2 (two) times daily as needed for migraine., Disp: , Rfl:    busPIRone  (BUSPAR ) 30 MG tablet, Take 1 tablet (30 mg total) by mouth 2 (two) times daily., Disp: 60 tablet, Rfl: 1   calcium  carbonate (TUMS) 500 MG chewable tablet, Chew 1 tablet (200 mg of elemental calcium  total) by mouth daily., Disp: 90 tablet, Rfl: 3   CAPLYTA  42 MG capsule, TAKE 1 CAPSULE EVERY DAY, Disp: 30 capsule, Rfl: 11   carbidopa -levodopa  (SINEMET  IR) 25-100 MG tablet, Take one tablet in the am and one tablet in the pm (Patient taking differently: Take 1 tablet by mouth 3 (three) times daily. Take one tablet in the am and one tablet in the pm), Disp: 60 tablet, Rfl: 4   cetirizine (ZYRTEC) 10 MG tablet, Take 10 mg by mouth daily as needed for allergies., Disp: , Rfl:    Cholecalciferol  75 MCG (3000 UT) TABS, Take 3,000 Units by mouth daily., Disp: 30 tablet, Rfl: 0   diltiazem  (CARDIZEM  CD) 120 MG 24 hr capsule, Take 1 capsule (120 mg total) by mouth daily., Disp: 30 capsule, Rfl: 1   diphenhydrAMINE  (BENADRYL  ALLERGY EXTRA STR) 50 MG tablet, Take 1 tablet (50 mg total) by mouth once for 1 dose. Take 1 tablet 1 hour prior to CT scan., Disp: 1 tablet, Rfl: 0   divalproex  (DEPAKOTE ) 500 MG DR tablet, Take 1 tablet (500 mg total) by mouth at bedtime as needed., Disp: 20 tablet, Rfl: 0   enoxaparin  (LOVENOX ) 120 MG/0.8ML injection, Inject 0.8 mLs (120 mg total) into the skin daily for 3 days. (Patient not taking: Reported on 08/14/2023), Disp: 2.4 mL, Rfl: 0   escitalopram  (LEXAPRO ) 10 MG tablet, TAKE 1 TABLET AT BEDTIME (Patient taking differently: Take 10 mg by mouth every morning.), Disp: 90 tablet, Rfl: 3   fexofenadine (ALLEGRA) 180 MG tablet, Take 180 mg by mouth as needed for allergies or rhinitis., Disp: , Rfl:    fluticasone  (FLONASE ) 50  MCG/ACT nasal spray, Place 1 spray into both nostrils daily as needed for allergies. , Disp: , Rfl:    lamoTRIgine  (LAMICTAL ) 200 MG tablet, TAKE 1 TABLET TWICE DAILY, Disp: 180 tablet, Rfl: 0   LORazepam  (ATIVAN ) 0.5 MG tablet, Take 1 tablet (0.5 mg total) by mouth 3 (three) times daily., Disp: 90 tablet, Rfl: 1   metFORMIN  (GLUCOPHAGE -XR) 500 MG 24 hr tablet, Take 2 tablets (1,000 mg total) by mouth 2 (two) times daily with a meal. (Patient not taking: Reported on 08/14/2023), Disp: 120 tablet, Rfl: 1   Multiple Vitamin (MULTIVITAMIN WITH  MINERALS) TABS tablet, Take 1 tablet by mouth daily. (Patient taking differently: Take 1 tablet by mouth daily. Centrum 50+), Disp: , Rfl:    polyethylene glycol (MIRALAX  / GLYCOLAX ) 17 g packet, Take 17 g by mouth 2 (two) times daily. (Patient taking differently: Take 17 g by mouth daily as needed for mild constipation.), Disp: 14 each, Rfl: 0   QUEtiapine  (SEROQUEL ) 50 MG tablet, TAKE 1 TABLET AT BEDTIME, Disp: 90 tablet, Rfl: 1   warfarin (COUMADIN ) 5 MG tablet, TAKE 1 TABLET (5 MG) BY MOUTH DAILY EXCEPT TAKE 1 1/2 TABLETS (7.5 MG) ON SUNDAY OR AS DIRECTED BY ANTICOAGULATION CLINIC, Disp: 100 tablet, Rfl: 1  Vitals   Vitals:   09-05-2023 1545 Sep 05, 2023 1645 2023-09-05 1715 05-Sep-2023 1923  BP: 139/78 118/82 (!) 145/78 109/78  Pulse: (!) 55 (!) 55 (!) 51 (!) 53  Resp: 14 18 15 14   Temp:    97.9 F (36.6 C)  TempSrc:    Oral  SpO2: 100% 98% 96% 97%  Weight:      Height:        Body mass index is 31.35 kg/m.  Physical Exam   Gen: patient lying in bed, NAD CV: extremities appear well-perfused Resp: normal WOB  Neurologic exam MS: alert, oriented x4, follows commands Speech: no dysarthria, no aphasia CN: PERRL, VFF, EOMI, sensation intact, face symmetric, hearing intact to voice Motor: initially unable to hold RUE and RLE against gravity, then unable to hold any extremity against gravity, then with significant coaching and encouragement no drift and 5/5  strength throughout Sensory: R sensory deficit Reflexes: 2+ symm with toes down bilat Coordination: FNF intact bilat Gait: deferred   Labs/Imaging/Neurodiagnostic studies   CBC:  Recent Labs  Lab 09-05-2023 1432 09-05-2023 1448 September 05, 2023 1459 2023-09-05 1514  WBC 8.4  --   --   --   NEUTROABS 5.3  --   --   --   HGB 12.2   < > 13.3 13.9  HCT 36.8   < > 39.0 41.0  MCV 81.1  --   --   --   PLT 164  --   --   --    < > = values in this interval not displayed.   Basic Metabolic Panel:  Lab Results  Component Value Date   NA 143 05-Sep-2023   K 3.3 (L) 09-05-23   CO2 17 (L) 09-05-23   GLUCOSE 127 (H) 09-05-2023   BUN 9 05-Sep-2023   CREATININE 0.90 2023-09-05   CALCIUM  9.1 2023-09-05   GFRNONAA >60 09-05-2023   GFRAA >60 12/10/2019   Lipid Panel:  Lab Results  Component Value Date   LDLCALC 61 07/25/2023   HgbA1c:  Lab Results  Component Value Date   HGBA1C 5.5 07/25/2023   Urine Drug Screen:     Component Value Date/Time   LABOPIA NONE DETECTED 09/05/23 1500   COCAINSCRNUR NONE DETECTED 09-05-2023 1500   LABBENZ POSITIVE (A) September 05, 2023 1500   AMPHETMU NONE DETECTED 09/05/23 1500   THCU NONE DETECTED 2023/09/05 1500   LABBARB NONE DETECTED 09/05/2023 1500    Alcohol Level     Component Value Date/Time   [facility] <15 09/05/2023 1432   INR  Lab Results  Component Value Date   INR 1.9 (H) 05-Sep-2023   APTT  Lab Results  Component Value Date   APTT 33 09-05-23   AED levels:  Lab Results  Component Value Date   LAMOTRIGINE  10.6 07/31/2021    CT Head without contrast(Personally  reviewed): No acute process  ASSESSMENT   Emily Rice is a 63 y.o. female with hx of bipolar disorder, A-fib and DVT on warfarin, migraines who presented as a stroke code for right sided weakness in the setting of migraine.  Her exam was highly functional and with encouragement and coaching she was able to not only move all extremities but hold them against gravity  without drift.  She did have an NIH of 1 for right sided sensory deficit which I suspect is secondary to complex migraine.  Recommend MRI brain to rule out small ischemic event as well as headache cocktail.  If MRI brain is negative okay to DC from ED.  RECOMMENDATIONS   - MRI brain wo contrast - Migraine cocktail - If MRI brain negative for acute finding, OK to d/c from EEG. If e/o acute ischemia please call neurology for further guidance ______________________________________________________________________    Signed, Eleni Griffin, MD [facility]

## 2023-08-15 NOTE — ED Provider Notes (Addendum)
 [facility] Provider Note   CSN: [id number] Arrival date & time: 08/15/23  1426  An emergency department physician performed an initial assessment on this suspected stroke patient at 1426.  History  Chief Complaint  Patient presents with   Headache    Emily Rice is a 63 y.o. female.  HPI 63 year old female history of A-fib, DVT, chronic migraines, bipolar disorder, on chronic anticoagulation with Coumadin  presents today with last known normal at 10 AM.  She reports that she had a headache.  It was worse than her normal migraine.  She went to sleep.  When she awoke she had weakness on the right side.  Reports taking her Coumadin  as prescribed.  She continues to have headache.  She has also complaining of right lower extremity pain.  She denies any recent trauma.      Home Medications Prior to Admission medications   Medication Sig Start Date End Date Taking? Authorizing Provider  acetaminophen  (TYLENOL ) 325 MG tablet Take 2 tablets (650 mg total) by mouth every 6 (six) hours as needed for mild pain or moderate pain. 04/04/20   Angiulli, Everlyn Hockey, PA-C  amiodarone  (PACERONE ) 200 MG tablet Take 1 tablet (200 mg total) twice a day for 3 days then take 1 tablet (200 mg total) once daily. Patient taking differently: Take 200 mg by mouth daily. 07/26/23   Regalado, Clifford Dam A, MD  aspirin -acetaminophen -caffeine  (EXCEDRIN  MIGRAINE) 250-250-65 MG tablet Take 2 tablets by mouth 2 (two) times daily as needed for migraine.    [provider]  busPIRone  (BUSPAR ) 30 MG tablet Take 1 tablet (30 mg total) by mouth 2 (two) times daily. 08/14/23   Mozingo, [name] Nattalie, NP  calcium  carbonate (TUMS) 500 MG chewable tablet Chew 1 tablet (200 mg of elemental calcium  total) by mouth daily. 07/26/23 07/25/24  Regalado, Belkys A, MD  CAPLYTA  42 MG capsule TAKE 1 CAPSULE EVERY DAY 03/25/23   Mozingo, [name] Nattalie, NP  carbidopa -levodopa  (SINEMET  IR)  25-100 MG tablet Take one tablet in the am and one tablet in the pm Patient taking differently: Take 1 tablet by mouth 3 (three) times daily. Take one tablet in the am and one tablet in the pm 05/25/20   Swartz, Zachary T, MD  cetirizine (ZYRTEC) 10 MG tablet Take 10 mg by mouth daily as needed for allergies.    [provider]  Cholecalciferol  75 MCG (3000 UT) TABS Take 3,000 Units by mouth daily. 04/04/20   Angiulli, Everlyn Hockey, PA-C  diltiazem  (CARDIZEM  CD) 120 MG 24 hr capsule Take 1 capsule (120 mg total) by mouth daily. 07/26/23   Regalado, Belkys A, MD  diphenhydrAMINE  (BENADRYL  ALLERGY EXTRA STR) 50 MG tablet Take 1 tablet (50 mg total) by mouth once for 1 dose. Take 1 tablet 1 hour prior to CT scan. 03/30/22 10/13/23  Boyce Byes, MD  divalproex  (DEPAKOTE ) 500 MG DR tablet Take 1 tablet (500 mg total) by mouth at bedtime as needed. 07/26/23   Regalado, Belkys A, MD  enoxaparin  (LOVENOX ) 120 MG/0.8ML injection Inject 0.8 mLs (120 mg total) into the skin daily for 3 days. Patient not taking: Reported on 08/14/2023 07/27/23 07/30/23  Catharine Clock A, MD  escitalopram  (LEXAPRO ) 10 MG tablet TAKE 1 TABLET AT BEDTIME Patient taking differently: Take 10 mg by mouth every morning. 10/31/22   Mozingo, [name] Nattalie, NP  fexofenadine (ALLEGRA) 180 MG tablet Take 180 mg by mouth as needed for allergies or rhinitis.  [provider]  fluticasone  (FLONASE ) 50 MCG/ACT nasal spray Place 1 spray into both nostrils daily as needed for allergies.     [provider]  lamoTRIgine  (LAMICTAL ) 200 MG tablet TAKE 1 TABLET TWICE DAILY 07/25/23   Mozingo, [name] Nattalie, NP  LORazepam  (ATIVAN ) 0.5 MG tablet Take 1 tablet (0.5 mg total) by mouth 3 (three) times daily. 08/14/23   Mozingo, [name] Nattalie, NP  metFORMIN  (GLUCOPHAGE -XR) 500 MG 24 hr tablet Take 2 tablets (1,000 mg total) by mouth 2 (two) times daily with a meal. Patient not taking: Reported on 08/14/2023 07/01/23   Ladd Picker, MD  Multiple Vitamin (MULTIVITAMIN WITH MINERALS) TABS tablet Take 1 tablet by mouth daily. Patient taking differently: Take 1 tablet by mouth daily. Centrum 50+ 04/05/20   Angiulli, Everlyn Hockey, PA-C  polyethylene glycol (MIRALAX  / GLYCOLAX ) 17 g packet Take 17 g by mouth 2 (two) times daily. Patient taking differently: Take 17 g by mouth daily as needed for mild constipation. 04/04/20   Angiulli, [name] J, PA-C  QUEtiapine  (SEROQUEL ) 50 MG tablet TAKE 1 TABLET AT BEDTIME 04/11/23   Mozingo, [name] Nattalie, NP  warfarin (COUMADIN ) 5 MG tablet TAKE 1 TABLET (5 MG) BY MOUTH DAILY EXCEPT TAKE 1 1/2 TABLETS (7.5 MG) ON SUNDAY OR AS DIRECTED BY ANTICOAGULATION CLINIC 07/30/23   Henson, Vickie L, NP-C      Allergies    Hydromorphone , Iodinated contrast media, and Prochlorperazine    Review of Systems   Review of Systems  Physical Exam Updated Vital Signs BP 138/68   Pulse (!) 48   Temp 97.8 F (36.6 C) (Oral)   Resp 17   Ht 1.6 m (5\' 3" )   Wt 80.3 kg   LMP  (LMP Unknown)   SpO2 100%   BMI 31.35 kg/m  Physical Exam Vitals reviewed.  HENT:     Head: Normocephalic.     Right Ear: External ear normal.     Left Ear: External ear normal.     Nose: Nose normal.     Mouth/Throat:     Pharynx: Oropharynx is clear.  Eyes:     Extraocular Movements: Extraocular movements intact.  Pulmonary:     Effort: Pulmonary effort is normal.  Musculoskeletal:        General: No deformity or signs of injury.     Cervical back: Normal range of motion.     Right lower leg: No edema.     Left lower leg: No edema.  Skin:    General: Skin is warm and dry.     Capillary Refill: Capillary refill takes less than 2 seconds.  Neurological:     Mental Status: She is alert.     Comments: Right arm and leg weakness per neurology.     ED Results / Procedures / Treatments   Labs (all labs ordered are listed, but only abnormal results are displayed) Labs Reviewed  PROTIME-INR - Abnormal; Notable for  the following components:      Result Value   Prothrombin Time 21.9 (*)    INR 1.9 (*)    All other components within normal limits  CBC - Abnormal; Notable for the following components:   RDW 18.1 (*)    All other components within normal limits  COMPREHENSIVE METABOLIC PANEL WITH GFR - Abnormal; Notable for the following components:   Potassium 3.4 (*)    CO2 17 (*)    Glucose, Bld 146 (*)    All other components within normal  limits  I-STAT CHEM 8, ED - Abnormal; Notable for the following components:   Potassium 6.5 (*)    Glucose, Bld 146 (*)    Calcium , Ion 0.96 (*)    All other components within normal limits  I-STAT CHEM 8, ED - Abnormal; Notable for the following components:   Potassium 7.7 (*)    Glucose, Bld 139 (*)    Calcium , Ion 0.92 (*)    All other components within normal limits  I-STAT CG4 LACTIC ACID, ED - Abnormal; Notable for the following components:   Lactic Acid, Venous 3.4 (*)    All other components within normal limits  I-STAT CHEM 8, ED - Abnormal; Notable for the following components:   Potassium 3.3 (*)    Glucose, Bld 127 (*)    Calcium , Ion 1.10 (*)    TCO2 21 (*)    All other components within normal limits  ETHANOL  APTT  DIFFERENTIAL  RAPID URINE DRUG SCREEN, HOSP PERFORMED    EKG None  Radiology CT HEAD CODE STROKE WO CONTRAST Result Date: 08/15/2023 CLINICAL DATA:  Code stroke.  Neuro deficit, concern for stroke. EXAM: CT HEAD WITHOUT CONTRAST TECHNIQUE: Contiguous axial images were obtained from the base of the skull through the vertex without intravenous contrast. RADIATION DOSE REDUCTION: This exam was performed according to the departmental dose-optimization program which includes automated exposure control, adjustment of the mA and/or kV according to patient size and/or use of iterative reconstruction technique. COMPARISON:  MRI head 07/24/2023, CT head 07/23/2023. FINDINGS: Brain: No acute intracranial hemorrhage. No CT evidence of  acute infarct. Similar white matter heterogeneity. No edema, mass effect, or midline shift. The basilar cisterns are patent. Ventricles: The ventricles are normal. Vascular: No hyperdense vessel or unexpected calcification. Skull: No acute or aggressive finding. Orbits: Orbits are symmetric. Sinuses: The visualized paranasal sinuses are clear. Other: Mastoid air cells are clear. ASPECTS [facility] Stroke Program Early CT Score) - Ganglionic level infarction (caudate, lentiform nuclei, internal capsule, insula, M1-M3 cortex): 7 - Supraganglionic infarction (M4-M6 cortex): 3 Total score (0-10 with 10 being normal): 10 IMPRESSION: 1. No CT evidence of acute intracranial abnormality. 2. ASPECTS is 10 These results were communicated to Dr. Doretta Gant At 2:45 pm on 08/15/2023 by text page via the [facility] messaging system. Electronically Signed   By: Denny Flack M.D.   On: 08/15/2023 14:45    Procedures Procedures    Medications Ordered in ED Medications  midazolam (VERSED) injection 1 mg (has no administration in time range)  diphenhydrAMINE  (BENADRYL ) injection 25 mg (25 mg Intravenous Given 08/15/23 1543)  metoCLOPramide  (REGLAN ) injection 10 mg (10 mg Intravenous Given 08/15/23 1544)    ED Course/ Medical Decision Making/ A&P                                 Medical Decision Making Amount and/or Complexity of Data Reviewed Labs: ordered. Radiology: ordered.  Risk Prescription drug management.   63 year old female on Coumadin  presents today with last known normal 10 AM and right sided weakness.  Patient was met at bridge as code stroke.  Patient was seen with Dr. Doretta Gant, on-call for neurology. Patient initially evaluated at bridge with above physical exam. Patient is and route to CT at this time. 3:47 PM Patient's right-sided weakness has resolved.  Discussed care with Dr. Doretta Gant.  She advises she will place orders for MRI.  Patient may be discharged if MRI is negative.  CMET pending  Initial k elevated,  no renal failure will need repeat  1- weakness on right side, now resolved, mri pending 2- potassium- plan redraw and reevaluate Repeat potassium drawn with larger needle is 3.3  Care discussed with Dr. Inga Manges who will dispo after above.        Final Clinical Impression(s) / ED Diagnoses Final diagnoses:  Right sided weakness  Chronic anticoagulation    Rx / DC Orders ED Discharge Orders     None         Auston Blush, MD 08/15/23 1505    Auston Blush, MD 08/15/23 1547

## 2023-08-15 NOTE — Telephone Encounter (Signed)
 Pt made request to r/s her appointment

## 2023-08-15 NOTE — Discharge Instructions (Signed)
 Your MRI looked good.  Please follow-up with your family doctor in the office.  I have placed referral for the neurologist to see you in the office as well.

## 2023-08-15 NOTE — ED Notes (Signed)
 MRI called, stated they were sending transport and to go ahead and medicate patient

## 2023-08-15 NOTE — ED Provider Notes (Signed)
 Received patient in turnover from Dr. Synetta Eves.  Please see their note for further details of Hx, PE.  Briefly patient is a 63 y.o. female with a Headache .  Patient arrived here as a code stroke.  Thought to be less likely to be a stroke.  Symptoms actually have improved significantly.  Plan for MRI.  If negative likely home.  MRI is negative for acute stroke.  Discussed results with patient.  Continues to feel well.  Will discharge home.  Neurology follow-up.    Albertus Hughs, DO 08/15/23 2141

## 2023-08-15 NOTE — Telephone Encounter (Signed)
 Pt c/o medication issue:  1. Name of Medication: amiodarone  (PACERONE ) 200 MG tablet  diltiazem  (CARDIZEM  CD) 120 MG 24 hr capsule  2. How are you currently taking this medication (dosage and times per day)? As written  3. Are you having a reaction (difficulty breathing--STAT)? No   4. What is your medication issue? Pt has questions about dosage and wants to discuss lab results

## 2023-08-15 NOTE — ED Notes (Signed)
 Mri called and stated patient wanted more meds for sedation because she was not sedated enough. Dr. Inga Manges advised. Per Dr. Inga Manges, gave beandryl and haloperidol first and 2mg  more of versed if needed.

## 2023-08-15 NOTE — ED Notes (Signed)
 Husband Keeon Quiett [phone] would like an update asap

## 2023-08-15 NOTE — Code Documentation (Signed)
 Stroke Response Nurse Documentation Code Documentation  Emily Rice is a 63 y.o. female arriving to [facility]  via [facility] on 08-15-23 with past medical hx of AF, migraines. On warfarin daily. Code stroke was activated by EMS.   Patient from home where she was LKW at 1000 and now complaining of Right side weakness and Headache.  She states that she felt good when she woke up this morning starting having an intermittent HA about 9a which became sever at 10am she laid down for a nap when she woke up about 11am her right arm and leg were weak.   Stroke team at the bedside on patient arrival. Labs drawn and patient cleared for CT by Dr. Synetta Eves. Patient to CT with team. Initially she was globally weak, then while in CT she was able to move all extremities.  NIHSS 1, see documentation for details and code stroke times. Patient with right decreased sensation on exam. The following imaging was completed:  CT Head. Patient is not a candidate for IV Thrombolytic due to being on coumadin . Patient is not a candidate for IR due to no LVO suspected. .   Care Plan: NIHSS q 2 hours x 12 hours.   Bedside handoff with ED Paramedic Dorothey Gate, Burtis Case  Stroke Response RN

## 2023-08-16 ENCOUNTER — Telehealth: Payer: Self-pay

## 2023-08-16 ENCOUNTER — Ambulatory Visit (INDEPENDENT_AMBULATORY_CARE_PROVIDER_SITE_OTHER)

## 2023-08-16 ENCOUNTER — Ambulatory Visit

## 2023-08-16 DIAGNOSIS — Z7901 Long term (current) use of anticoagulants: Secondary | ICD-10-CM

## 2023-08-16 LAB — CBG MONITORING, ED: Glucose-Capillary: 173 mg/dL — ABNORMAL HIGH (ref 70–99)

## 2023-08-16 MED ORDER — POTASSIUM CHLORIDE CRYS ER 20 MEQ PO TBCR: 20.0000 meq | EXTENDED_RELEASE_TABLET | Freq: Every day | ORAL | 3 refills | Status: DC

## 2023-08-16 NOTE — Telephone Encounter (Signed)
 Spoke with the patient and advised on recommendations from Dr. Marven Slimmer to start on potassium. Prescription has been sent in and labs ordered. She will come by the office next Friday for repeat labs.

## 2023-08-16 NOTE — Progress Notes (Signed)
 Pt NS coumadin  clinic apt this morning. Review of chart revealed pt was in ER yesterday for right sided weakness and initially thought a stroke. MRI was negative for stroke and pt was d/c.Pt currently on 200 mg amiodarone  daily, started on 5/16. Amiodarone  has a major interaction with warfarin and will need to monitor INR closely for several weeks. INR in ER yesterday was 1.9 Pt returned call. Advised of dosing and scheduled next coumadin  clinic apt Increase dose today to take 1 1/2 tablets and then continue 1 tablet daily except take 1 1/2 tablets on Sunday. Recheck in 2 weeks.

## 2023-08-16 NOTE — Telephone Encounter (Signed)
 Called pt to inform her that due to a schedule change in the lab we need to reschedule her Afib ablation with Dr. Marven Slimmer. She was moved from 8/21 to 8/11 at 12:30 pm.

## 2023-08-16 NOTE — Telephone Encounter (Signed)
 Pt NS coumadin  clinic apt this morning. Review of chart revealed pt was in ER yesterday for right sided weakness and initially thought a stroke. MRI was negative for stroke and pt was d/c.  INR was performed in ER and was 1.9 yesterday.  Pt has been recently started on amiodarone  which has a major interaction with warfarin. Need to monitor closely.   LVM for pt to return call.   Pt returned call. Gave dosing instructions. Documented in anticoagulation encounter.

## 2023-08-16 NOTE — Patient Instructions (Addendum)
 Pre visit review using our clinic review tool, if applicable. No additional management support is needed unless otherwise documented below in the visit note.  Increase dose today to take 1 1/2 tablets and then continue 1 tablet daily except take 1 1/2 tablets on Sunday. Recheck in 2 weeks.

## 2023-08-16 NOTE — Telephone Encounter (Signed)
 Patient is following up. She would like to know if another provider is able to advise on Dr. Candace Cerise behalf since he isn't in the office. She is hopefully for feedback on her medications before EOD if at all possible.

## 2023-08-19 ENCOUNTER — Encounter: Payer: Self-pay | Admitting: Neurology

## 2023-08-19 ENCOUNTER — Ambulatory Visit (INDEPENDENT_AMBULATORY_CARE_PROVIDER_SITE_OTHER): Admitting: Neurology

## 2023-08-19 ENCOUNTER — Other Ambulatory Visit: Payer: Self-pay | Admitting: Neurology

## 2023-08-19 VITALS — BP 126/80 | Ht 63.0 in | Wt 168.0 lb

## 2023-08-19 DIAGNOSIS — Z79899 Other long term (current) drug therapy: Secondary | ICD-10-CM

## 2023-08-19 DIAGNOSIS — R251 Tremor, unspecified: Secondary | ICD-10-CM | POA: Diagnosis not present

## 2023-08-19 DIAGNOSIS — R413 Other amnesia: Secondary | ICD-10-CM | POA: Diagnosis not present

## 2023-08-19 DIAGNOSIS — F3160 Bipolar disorder, current episode mixed, unspecified: Secondary | ICD-10-CM | POA: Diagnosis not present

## 2023-08-19 DIAGNOSIS — G43709 Chronic migraine without aura, not intractable, without status migrainosus: Secondary | ICD-10-CM

## 2023-08-19 MED ORDER — NURTEC 75 MG PO TBDP: 75.0000 mg | ORAL_TABLET | ORAL | 11 refills | Status: DC
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
## 2023-08-20 ENCOUNTER — Telehealth: Payer: Self-pay

## 2023-08-20 ENCOUNTER — Other Ambulatory Visit (HOSPITAL_COMMUNITY): Payer: Self-pay

## 2023-08-20 ENCOUNTER — Telehealth: Payer: Self-pay | Admitting: Cardiology

## 2023-08-20 MED ORDER — AMIODARONE HCL 200 MG PO TABS: 200.0000 mg | ORAL_TABLET | Freq: Every day | ORAL | 1 refills | Status: DC

## 2023-08-20 MED ORDER — DILTIAZEM HCL ER COATED BEADS 120 MG PO CP24
120.0000 mg | ORAL_CAPSULE | Freq: Every day | ORAL | 3 refills | Status: AC
Start: 2023-08-20 — End: ?

## 2023-08-20 NOTE — Telephone Encounter (Signed)
 Pharmacy Patient Advocate Encounter   Received notification from RX Request Messages that prior authorization for Emgality 120MG /ML auto-injectors (migraine) is required/requested.   Insurance verification completed.   The patient is insured through [place] .   Per test claim: PA required; PA submitted to above mentioned insurance via CoverMyMeds Key/confirmation #/EOC [id number] Status is pending

## 2023-08-20 NOTE — Addendum Note (Signed)
 Addended by: CHAUVIGNE, Berania Peedin on: 08/20/2023 03:08 PM   Modules accepted: Orders

## 2023-08-20 NOTE — Telephone Encounter (Signed)
 Pharmacy Patient Advocate Encounter  Received notification from [employer] that Prior Authorization for Emgality 120MG /ML auto-injectors (migraine) has been APPROVED from 08/20/2023 to 03/11/2024. Ran test claim, Copay is $654.84. This test claim was processed through [facility]- copay amounts may vary at other pharmacies due to pharmacy/plan contracts, or as the patient moves through the different stages of their insurance plan.   PA #/Case ID/Reference #: PA Case: [id number]     Copay very high-if pt wants to use this they may want to call their insurance plan to check in on the [facility] Prescription Payment Plan Program.

## 2023-08-20 NOTE — Telephone Encounter (Signed)
 Spoke with pt who stated she was seen by Dr. Gracie Lav yesterday who told her that the last potassium that was drawn on her was a 7.7 and she needed to hold her potassium supplements. Pt stated she felt she needed to check with out office first before stopping supplements. Explained to pt that based on the timeline of the potassium readings in the ED, it appeared that the 6.5 and 7.7 potassium readings were incorrect. Pt stated that the provider in the ED also stated they felt that was the case because the lab was drawn from a heparin lock rather than a clean venipuncture stick. Advised pt to continue with the potassium supplements and plan to get her labs redrawn this coming Friday 6/13. Pt stated she also needs a refill on her Amiodarone  and will need one soon on her Diltiazem  because they were prescribed in the hospital without refills (Diltiazem  did have 1 refill for 30 days). Will route to Dr. Candace Cerise covering nurse to review.

## 2023-08-20 NOTE — Telephone Encounter (Signed)
Refills have been sent in. 

## 2023-08-20 NOTE — Telephone Encounter (Signed)
 Pt c/o medication issue:  1. Name of Medication: Potassium Chloride   2. How are you currently taking this medication (dosage and times per day)?   3. Are you having a reaction (difficulty breathing--STAT)?   4. What is your medication issue? Patient says she needs Carlyle to please call her. She ssays she have some question about this medicine and also about her lab results.

## 2023-08-21 ENCOUNTER — Other Ambulatory Visit: Payer: Self-pay | Admitting: Adult Health

## 2023-08-21 DIAGNOSIS — F310 Bipolar disorder, current episode hypomanic: Secondary | ICD-10-CM

## 2023-08-23 DIAGNOSIS — I480 Paroxysmal atrial fibrillation: Secondary | ICD-10-CM | POA: Diagnosis not present

## 2023-08-23 NOTE — Telephone Encounter (Signed)
 Pt reports the cost of the medication is still too high, she would like a call to discuss another medication which is not as expensive.  Pt is having daily headaches and would like to be contacted about something else being called in for her as soon as possible. Phone rep advised her concerns would be documented for clinical staff to be made aware of

## 2023-08-26 ENCOUNTER — Encounter: Payer: Self-pay | Admitting: Neurology

## 2023-08-26 ENCOUNTER — Telehealth: Payer: Self-pay | Admitting: Family Medicine

## 2023-08-26 ENCOUNTER — Ambulatory Visit (INDEPENDENT_AMBULATORY_CARE_PROVIDER_SITE_OTHER): Admitting: Internal Medicine

## 2023-08-26 ENCOUNTER — Telehealth: Payer: Self-pay | Admitting: Neurology

## 2023-08-26 ENCOUNTER — Encounter (INDEPENDENT_AMBULATORY_CARE_PROVIDER_SITE_OTHER): Payer: Self-pay | Admitting: Internal Medicine

## 2023-08-26 VITALS — BP 130/86 | HR 67 | Temp 98.4°F | Ht 63.0 in | Wt 166.0 lb

## 2023-08-26 DIAGNOSIS — T50905A Adverse effect of unspecified drugs, medicaments and biological substances, initial encounter: Secondary | ICD-10-CM | POA: Diagnosis not present

## 2023-08-26 DIAGNOSIS — E66811 Obesity, class 1: Secondary | ICD-10-CM

## 2023-08-26 DIAGNOSIS — R7303 Prediabetes: Secondary | ICD-10-CM

## 2023-08-26 DIAGNOSIS — Z6831 Body mass index (BMI) 31.0-31.9, adult: Secondary | ICD-10-CM

## 2023-08-26 MED ORDER — METFORMIN HCL ER 500 MG PO TB24
500.0000 mg | ORAL_TABLET | Freq: Two times a day (BID) | ORAL | Status: DC
Start: 2023-08-26 — End: 2023-12-03

## 2023-08-26 MED ORDER — GABAPENTIN 300 MG PO CAPS: 300.0000 mg | ORAL_CAPSULE | Freq: Three times a day (TID) | ORAL | 3 refills | Status: DC

## 2023-08-26 NOTE — Telephone Encounter (Signed)
 Call to patient she is in agreement to try botox of covered and reasonable copay. In the meantime, she is wondering what can be given in the meantime as migraines are worsening and becoming more frequent. Advised I would send to Dr. Gracie Lav for review.

## 2023-08-26 NOTE — Telephone Encounter (Unsigned)
 Copied from CRM [id number]. Topic: Clinical - Medical Advice >> Aug 26, 2023 11:50 AM Magdalene School wrote: Reason for CRM: Patient would like a callback from the nurse to discuss most recent hospital visit and provide details prior to hospital follow up appointment on 09/11/23. Would also like to discuss urin lab results.

## 2023-08-26 NOTE — Telephone Encounter (Signed)
 Pt called wanting to inform provider that her migraines are getting worse, now she is waking up with them and they are on and off through the day. She also states that the hand tremors she had are not getting better. Please advise.

## 2023-08-26 NOTE — Progress Notes (Signed)
 Office: [phone]  /  Fax: [phone]  Weight Summary And Biometrics  Vitals Temp: 98.4 F (36.9 C) BP: 130/86 Pulse Rate: 67 SpO2: 98 %   Anthropometric Measurements Height: 5' 3 (1.6 m) Weight: 166 lb (75.3 kg) BMI (Calculated): 29.41 Weight at Last Visit: 167 lb Weight Lost Since Last Visit: 1 lb Weight Gained Since Last Visit: 0 lb Starting Weight: 179 lb Total Weight Loss (lbs): 13 lb (5.897 kg) Peak Weight: 203 lb   Body Composition  Body Fat %: 38.6 % Fat Mass (lbs): 64.4 lbs Muscle Mass (lbs): 97.2 lbs Total Body Water (lbs): 68.2 lbs Visceral Fat Rating : 10    RMR: 1325  Today's Visit #: 6  Starting Date: 03/14/23   Subjective   Chief Complaint: Obesity  Interval History Since last office she reports good adherence to reduced calorie nutritional plan. to prescribed reduced calorie nutrition plan. Has been working on not skipping meals, maintaining adequate hydration and protein intake.  Has not been tracking or journaling has been incorporating intermittent fasting Denies problems with appetite and hunger signals.  Denies problems with satiety and satiation.  Denies problems with eating patterns and portion control.   Reports metformin  was recently interrupted but she would like to restart medication.  She is on several psychotropic obesogenic drugs which the metformin  has been helping her lose weight and not gain additional weight  Challenges affecting patient progress: medical comorbidities and presence of obesogenic drugs.    Pharmacotherapy for weight management: She is currently taking Metformin  (off label use for incretin effect and / or insulin  resistance and / or diabetes prevention) with adequate clinical response  and without side effects..   Assessment and Plan   Treatment Plan For Obesity:  Recommended Dietary Goals  Emily Rice is currently in the action stage of change. As such, her goal is to continue weight management  plan. She has agreed to: continue current plan  Behavioral Health and Counseling  We discussed the following behavioral modification strategies today: continue to work on maintaining a reduced calorie state, getting the recommended amount of protein, incorporating whole foods, making healthy choices, staying well hydrated and practicing mindfulness when eating..  Additional education and resources provided today: None  Recommended Physical Activity Goals  Emily Rice has been advised to work up to 150 minutes of moderate intensity aerobic activity a week and strengthening exercises 2-3 times per week for cardiovascular health, weight loss maintenance and preservation of muscle mass.   She has agreed to :  Think about enjoyable ways to increase daily physical activity and overcoming barriers to exercise and Increase physical activity in their day and reduce sedentary time (increase NEAT).  Pharmacotherapy  We discussed various medication options to help Emily Rice with her weight loss efforts and we both agreed to : Restart metformin  at 500 mg 1 tablet twice daily  Associated Conditions Impacted by Obesity Treatment  Weight gain due to medication  Prediabetes -     metFORMIN  HCl ER; Take 1 tablet (500 mg total) by mouth 2 (two) times daily with a meal.  Class 1 obesity with serious comorbidity and body mass index (BMI) of 31.0 to 31.9 in adult, unspecified obesity type -     metFORMIN  HCl ER; Take 1 tablet (500 mg total) by mouth 2 (two) times daily with a meal.     She has lost 13 pounds and body composition analysis shows improvements in body fat as well as visceral fat.  She has overall maintained muscle  mass.  We discussed continuing with exercise with emphasis on strengthening.  I will like for her to restart metformin  for diabetes prevention and to offset weight gain associated with psychotropic medication use for her bipolar disorder.  She will restart metformin  at 500 mg twice daily.   Most recent A1c was 5.5.  Patient also counseled to avoid weight centric mindset as she is noticing improvements in body composition, however close is fitting.  Is also been improvements in glycemic control.  Continue current weight management strategy  Objective   Physical Exam:  Blood pressure 130/86, pulse 67, temperature 98.4 F (36.9 C), height 5' 3 (1.6 m), weight 166 lb (75.3 kg), SpO2 98%. Body mass index is 29.41 kg/m.  General: She is overweight, cooperative, alert, well developed, and in no acute distress. PSYCH: Has normal mood, affect and thought process.   HEENT: EOMI, sclerae are anicteric. Lungs: Normal breathing effort, no conversational dyspnea. Extremities: No edema.  Neurologic: No gross sensory or motor deficits. No tremors or fasciculations noted.    Diagnostic Data Reviewed:  BMET    Component Value Date/Time   NA 141 08/23/2023 1009   K 4.0 08/23/2023 1009   CL 106 08/23/2023 1009   CO2 19 (L) 08/23/2023 1009   GLUCOSE 95 08/23/2023 1009   GLUCOSE 127 (H) 08/15/2023 1514   BUN 14 08/23/2023 1009   CREATININE 1.04 (H) 08/23/2023 1009   CALCIUM  9.1 08/23/2023 1009   GFRNONAA >60 08/15/2023 1432   GFRAA >60 12/10/2019 1600   Lab Results  Component Value Date   HGBA1C 5.5 07/25/2023   HGBA1C 5.8 (H) 07/31/2021   Lab Results  Component Value Date   INSULIN  8.7 03/14/2023   Lab Results  Component Value Date   TSH 2.260 08/14/2023   CBC    Component Value Date/Time   WBC 8.4 08/15/2023 1432   RBC 4.54 08/15/2023 1432   HGB 13.9 08/15/2023 1514   HGB 13.0 04/27/2022 0735   HCT 41.0 08/15/2023 1514   HCT 39.7 04/27/2022 0735   PLT 164 08/15/2023 1432   PLT 239 04/27/2022 0735   MCV 81.1 08/15/2023 1432   MCV 80 04/27/2022 0735   MCH 26.9 08/15/2023 1432   MCHC 33.2 08/15/2023 1432   RDW 18.1 (H) 08/15/2023 1432   RDW 16.0 (H) 04/27/2022 0735   Iron Studies No results found for: IRON, TIBC, FERRITIN, IRONPCTSAT Lipid Panel      Component Value Date/Time   CHOL 132 07/25/2023 0502   CHOL 193 02/27/2021 1013   TRIG 76 07/25/2023 0502   HDL 56 07/25/2023 0502   HDL 60 02/27/2021 1013   CHOLHDL 2.4 07/25/2023 0502   VLDL 15 07/25/2023 0502   LDLCALC 61 07/25/2023 0502   LDLCALC 115 (H) 02/27/2021 1013   LDLDIRECT 111 (H) 02/27/2021 1013   Hepatic Function Panel     Component Value Date/Time   PROT 7.0 08/15/2023 1432   PROT 7.6 08/14/2023 1341   ALBUMIN  4.2 08/15/2023 1432   ALBUMIN  4.8 08/14/2023 1341   AST 26 08/15/2023 1432   ALT 9 08/15/2023 1432   ALKPHOS 55 08/15/2023 1432   BILITOT 0.7 08/15/2023 1432   BILITOT 0.3 08/14/2023 1341   BILIDIR <0.1 07/23/2023 0528   IBILI NOT CALCULATED 07/23/2023 0528      Component Value Date/Time   TSH 2.260 08/14/2023 1341   Nutritional Lab Results  Component Value Date   VD25OH 54.08 07/26/2023   VD25OH 59.4 03/14/2023   VD25OH 30.38 03/04/2020  Medications: Outpatient Encounter Medications as of 08/26/2023  Medication Sig Note   acetaminophen  (TYLENOL ) 325 MG tablet Take 2 tablets (650 mg total) by mouth every 6 (six) hours as needed for mild pain or moderate pain.    amiodarone  (PACERONE ) 200 MG tablet Take 1 tablet (200 mg total) by mouth daily.    aspirin -acetaminophen -caffeine  (EXCEDRIN  MIGRAINE) 250-250-65 MG tablet Take 2 tablets by mouth 2 (two) times daily as needed for migraine. 07/23/2023: Has been taking for about a week d/t headache.   busPIRone  (BUSPAR ) 30 MG tablet Take 1 tablet (30 mg total) by mouth 2 (two) times daily.    calcium  carbonate (TUMS) 500 MG chewable tablet Chew 1 tablet (200 mg of elemental calcium  total) by mouth daily.    CAPLYTA  42 MG capsule TAKE 1 CAPSULE EVERY DAY    carbidopa -levodopa  (SINEMET  IR) 25-100 MG tablet Take one tablet in the am and one tablet in the pm (Patient taking differently: Take 1 tablet by mouth 3 (three) times daily. Take one tablet in the am and one tablet in the pm) 07/23/2023: 0630, 1330,  2030   cetirizine (ZYRTEC) 10 MG tablet Take 10 mg by mouth daily as needed for allergies.    Cholecalciferol  75 MCG (3000 UT) TABS Take 3,000 Units by mouth daily.    diltiazem  (CARDIZEM  CD) 120 MG 24 hr capsule Take 1 capsule (120 mg total) by mouth daily.    diphenhydrAMINE  (BENADRYL  ALLERGY EXTRA STR) 50 MG tablet Take 1 tablet (50 mg total) by mouth once for 1 dose. Take 1 tablet 1 hour prior to CT scan.    enoxaparin  (LOVENOX ) 120 MG/0.8ML injection Inject 0.8 mLs (120 mg total) into the skin daily for 3 days.    escitalopram  (LEXAPRO ) 10 MG tablet TAKE 1 TABLET AT BEDTIME    fexofenadine (ALLEGRA) 180 MG tablet Take 180 mg by mouth as needed for allergies or rhinitis.    fluticasone  (FLONASE ) 50 MCG/ACT nasal spray Place 1 spray into both nostrils daily as needed for allergies.     lamoTRIgine  (LAMICTAL ) 200 MG tablet TAKE 1 TABLET TWICE DAILY    LORazepam  (ATIVAN ) 0.5 MG tablet Take 1 tablet (0.5 mg total) by mouth 3 (three) times daily.    Multiple Vitamin (MULTIVITAMIN WITH MINERALS) TABS tablet Take 1 tablet by mouth daily.    polyethylene glycol (MIRALAX  / GLYCOLAX ) 17 g packet Take 17 g by mouth 2 (two) times daily. (Patient taking differently: Take 17 g by mouth daily as needed for mild constipation.)    potassium chloride  SA (KLOR-CON  M) 20 MEQ tablet Take 1 tablet (20 mEq total) by mouth daily.    QUEtiapine  (SEROQUEL ) 50 MG tablet TAKE 1 TABLET AT BEDTIME    warfarin (COUMADIN ) 5 MG tablet TAKE 1 TABLET (5 MG) BY MOUTH DAILY EXCEPT TAKE 1 1/2 TABLETS (7.5 MG) ON SUNDAY OR AS DIRECTED BY ANTICOAGULATION CLINIC    divalproex  (DEPAKOTE ) 500 MG DR tablet Take 1 tablet (500 mg total) by mouth at bedtime as needed.    Galcanezumab-gnlm (EMGALITY) 120 MG/ML SOAJ Inject 1 Pen into the skin every 30 (thirty) days.    metFORMIN  (GLUCOPHAGE -XR) 500 MG 24 hr tablet Take 1 tablet (500 mg total) by mouth 2 (two) times daily with a meal.    [DISCONTINUED] metFORMIN  (GLUCOPHAGE -XR) 500 MG 24 hr  tablet Take 2 tablets (1,000 mg total) by mouth 2 (two) times daily with a meal. (Patient not taking: Reported on 08/26/2023)    No facility-administered encounter medications on  file as of 08/26/2023.     Follow-Up   Return in about 4 weeks (around 09/23/2023) for For Weight Mangement with Dr. Allie Area.Aaron Aas She was informed of the importance of frequent follow up visits to maximize her success with intensive lifestyle modifications for her multiple health conditions.  Attestation Statement   Reviewed by clinician on day of visit: allergies, medications, problem list, medical history, surgical history, family history, social history, and previous encounter notes.     Ladd Picker, MD

## 2023-08-26 NOTE — Telephone Encounter (Signed)
 Meds ordered this encounter  Medications   gabapentin  (NEURONTIN ) 300 MG capsule    Sig: Take 1 capsule (300 mg total) by mouth 3 (three) times daily.    Dispense:  90 capsule    Refill:  3

## 2023-08-26 NOTE — Telephone Encounter (Signed)
 Please call patient, I reviewed her extensive medication list, on polypharmacy due to mood disorder  CGRP antagonist brand medication for migraine, will likely incur high cost  Treatment is also limited due to her A-fib, warfarin treatment  The other option would be Botox injection as migraine prevention  If she agree we may proceed with paperwork

## 2023-08-26 NOTE — Telephone Encounter (Signed)
Documentation in another note 

## 2023-08-26 NOTE — Telephone Encounter (Signed)
 Patient is currently in communication with neurology in regards to this.

## 2023-08-27 ENCOUNTER — Other Ambulatory Visit: Payer: Self-pay

## 2023-08-27 ENCOUNTER — Telehealth: Payer: Self-pay

## 2023-08-27 MED ORDER — ONABOTULINUMTOXINA 200 UNITS IJ SOLR
200.0000 [IU] | INTRAMUSCULAR | 3 refills | Status: DC
  Filled 2023-08-27: qty 1, 90d supply, fill #0

## 2023-08-27 NOTE — Telephone Encounter (Signed)
 Submitted auth request to [facility] via CMM, status is pending. (Key: [id number])

## 2023-08-27 NOTE — Telephone Encounter (Signed)
 Needs Botox auth. New start.   Chronic Migraine CPT 64615    Botox J0585 Units:200   G43.709

## 2023-08-27 NOTE — Telephone Encounter (Signed)
 Called pt to try and get her in sooner. Pt could not do 25th at 4:20 and states she is going on vacation the next day. She will wait until her scheduled appt in July

## 2023-08-27 NOTE — Telephone Encounter (Signed)
 Received approval, pt will fill through [facility]. I called her to schedule an appointment and she had a lot of questions regarding injection sites and if she'd be okay to have Botox while on her blood thinner. She states excessive bleeding is a big issue for her and she's worried about bleeding in her face/neck. She would like a call back from clinical staff to discuss, verified phone # on file is her best #. Thank you!  Auth#: [id number] (08/27/23-03/11/24)

## 2023-08-27 NOTE — Addendum Note (Signed)
 Addended by: Mollie Anger E on: 08/27/2023 03:05 PM   Modules accepted: Orders

## 2023-08-27 NOTE — Telephone Encounter (Signed)
 Sent as requested.

## 2023-08-28 ENCOUNTER — Other Ambulatory Visit: Payer: Self-pay

## 2023-08-28 ENCOUNTER — Other Ambulatory Visit (HOSPITAL_COMMUNITY): Payer: Self-pay

## 2023-08-28 MED ORDER — DILTIAZEM HCL ER COATED BEADS 120 MG PO CP24
120.0000 mg | ORAL_CAPSULE | Freq: Every day | ORAL | 3 refills | Status: DC
Start: 2023-08-28 — End: 2023-10-25

## 2023-08-28 MED ORDER — POTASSIUM CHLORIDE CRYS ER 20 MEQ PO TBCR: 20.0000 meq | EXTENDED_RELEASE_TABLET | Freq: Every day | ORAL | 3 refills | Status: AC

## 2023-08-28 NOTE — Telephone Encounter (Signed)
 Pt has 20% coinsurance for J0585, this applies whether she uses [facility] or is B/B. Her copay would be $563.26. She is not eligible for the Botox Savings Program under Medicare. I sent her the link for the Medicare Extra Help program for her to read up on and to let me know if she'd like to move forward with Botox.

## 2023-08-28 NOTE — Telephone Encounter (Signed)
 Noted

## 2023-08-28 NOTE — Telephone Encounter (Signed)
 Called pt and scheduled for 10/09/23 @ 2:40 pm with Dr. Gracie Lav. She is aware that [facility] will be calling her and she may have a copay to ship the medication.

## 2023-08-28 NOTE — Telephone Encounter (Signed)
 Call to patient to review Botox and blood thinners. Advised injection needles were small and were made subcutaneous. Mychart message sent with sites. She is in agreement to move forward. Sent to Jillian to schedule

## 2023-08-29 ENCOUNTER — Encounter: Payer: Self-pay | Admitting: Adult Health

## 2023-08-29 ENCOUNTER — Ambulatory Visit: Admitting: Adult Health

## 2023-08-29 ENCOUNTER — Telehealth: Payer: Self-pay | Admitting: Family Medicine

## 2023-08-29 DIAGNOSIS — G4700 Insomnia, unspecified: Secondary | ICD-10-CM

## 2023-08-29 DIAGNOSIS — F4310 Post-traumatic stress disorder, unspecified: Secondary | ICD-10-CM | POA: Diagnosis not present

## 2023-08-29 DIAGNOSIS — F09 Unspecified mental disorder due to known physiological condition: Secondary | ICD-10-CM

## 2023-08-29 DIAGNOSIS — F310 Bipolar disorder, current episode hypomanic: Secondary | ICD-10-CM | POA: Diagnosis not present

## 2023-08-29 DIAGNOSIS — F411 Generalized anxiety disorder: Secondary | ICD-10-CM | POA: Diagnosis not present

## 2023-08-29 MED ORDER — QUETIAPINE FUMARATE 50 MG PO TABS
ORAL_TABLET | ORAL | 2 refills | Status: AC
Start: 2023-08-29 — End: ?

## 2023-08-29 NOTE — Progress Notes (Addendum)
 Emily Rice [id number] 08-31-1960 63 y.o.  Subjective:   Patient ID:  Emily Rice is a 63 y.o. (DOB 11-08-1960) female.  Chief Complaint: No chief complaint on file.   HPI Emily Rice presents to the office today for follow-up of Bipolar effective disorder, cognitive disorder, GAD, and insomnia.    Describes mood today as ok. Denies tearfulness. Mood symptoms - denies depression. Reports anxiety and irritability. Reports improved interest and motivation. Reports panic attacks - every other day - worse when leaving the house and having to interact with someone. Reports worry, rumination and over thinking. Reports some obsessive thoughts and acts. Reports mood is consistent. Stating I feel like I'm more even keel. Feels like medications are helpful. Taking medications as prescribed. Working with therapist.  Energy levels stable. Active, has a regular exercise routine. Enjoys some usual interests and activities. Married. Lives with husband. Has 2 grown children. Spending time with family. Appetite adequate. Weight loss - working with [facility] weight loss management. Sleeps better some nights than others. Averages 4 to 5 hours. Denies daytime napping. Reports ongoing difficulties with focus and concentration. Completing tasks. Managing aspects of household. 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 Denies SI or HI.  Denies AH or VH. Denies self harm. Denies substance use.   CAGE-AID    Flowsheet Row ED to Hosp-Admission (Discharged) from 03/02/2020 in [facility]  CAGE-AID Score 0   PHQ2-9    Flowsheet Row Office Visit from 06/20/2023 in [facility] Clinical Support from 03/29/2023 in [facility] Office Visit from 10/05/2020 in [facility] Office Visit from 04/21/2020 in [facility]  PHQ-2  Total Score 0 0 4 3  PHQ-9 Total Score -- 0 -- 9   Flowsheet Row ED from 08/15/2023 in [facility] Emergency Department at [facility] ED to Hosp-Admission (Discharged) from 07/22/2023 in [facility] ED from 12/06/2022 in [facility] Department at  [facility]  C-SSRS RISK CATEGORY No Risk No Risk No Risk     Review of Systems:  Review of Systems  Musculoskeletal:  Negative for gait problem.  Neurological:  Negative for tremors.  Psychiatric/Behavioral:         Please refer to HPI    Medications: I have reviewed the patient's current medications.  Current Outpatient Medications  Medication Sig Dispense Refill   acetaminophen  (TYLENOL ) 325 MG tablet Take 2 tablets (650 mg total) by mouth every 6 (six) hours as needed for mild pain or moderate pain.     amiodarone  (PACERONE ) 200 MG tablet Take 1 tablet (200 mg total) by mouth daily. 90 tablet 1   aspirin -acetaminophen -caffeine  (EXCEDRIN  MIGRAINE) 250-250-65 MG tablet Take 2 tablets by mouth 2 (two) times daily as needed for migraine.     botulinum toxin Type A (BOTOX) 200 units injection Inject 200 Units into the muscle every 3 (three) months. 1 each 3   busPIRone  (BUSPAR ) 30 MG tablet Take 1 tablet (30 mg total) by mouth 2 (two) times daily. 60 tablet 1   calcium  carbonate (TUMS) 500 MG chewable tablet Chew 1 tablet (200 mg of elemental calcium  total) by mouth daily. 90 tablet 3   CAPLYTA  42 MG capsule TAKE 1 CAPSULE EVERY DAY 30 capsule 11   carbidopa -levodopa  (SINEMET  IR) 25-100 MG tablet Take one tablet in the am and one tablet  in the pm (Patient taking differently: Take 1 tablet by mouth 3 (three) times daily. Take one tablet in the am and one tablet in the pm) 60 tablet 4   cetirizine (ZYRTEC) 10 MG tablet Take 10 mg by mouth daily as needed for allergies.     Cholecalciferol  75 MCG (3000 UT) TABS Take 3,000 Units by mouth daily. 30 tablet 0   diltiazem  (CARDIZEM  CD) 120 MG 24 hr capsule Take  1 capsule (120 mg total) by mouth daily. 90 capsule 3   diphenhydrAMINE  (BENADRYL  ALLERGY EXTRA STR) 50 MG tablet Take 1 tablet (50 mg total) by mouth once for 1 dose. Take 1 tablet 1 hour prior to CT scan. 1 tablet 0   divalproex  (DEPAKOTE ) 500 MG DR tablet Take 1 tablet (500 mg total) by mouth at bedtime as needed. 20 tablet 0   enoxaparin  (LOVENOX ) 120 MG/0.8ML injection Inject 0.8 mLs (120 mg total) into the skin daily for 3 days. 2.4 mL 0   escitalopram  (LEXAPRO ) 10 MG tablet TAKE 1 TABLET AT BEDTIME 90 tablet 0   fexofenadine (ALLEGRA) 180 MG tablet Take 180 mg by mouth as needed for allergies or rhinitis.     fluticasone  (FLONASE ) 50 MCG/ACT nasal spray Place 1 spray into both nostrils daily as needed for allergies.      gabapentin  (NEURONTIN ) 300 MG capsule Take 1 capsule (300 mg total) by mouth 3 (three) times daily. 90 capsule 3   Galcanezumab-gnlm (EMGALITY) 120 MG/ML SOAJ Inject 1 Pen into the skin every 30 (thirty) days. 1 mL 11   lamoTRIgine  (LAMICTAL ) 200 MG tablet TAKE 1 TABLET TWICE DAILY 180 tablet 0   LORazepam  (ATIVAN ) 0.5 MG tablet Take 1 tablet (0.5 mg total) by mouth 3 (three) times daily. 90 tablet 1   metFORMIN  (GLUCOPHAGE -XR) 500 MG 24 hr tablet Take 1 tablet (500 mg total) by mouth 2 (two) times daily with a meal.     Multiple Vitamin (MULTIVITAMIN WITH MINERALS) TABS tablet Take 1 tablet by mouth daily.     polyethylene glycol (MIRALAX  / GLYCOLAX ) 17 g packet Take 17 g by mouth 2 (two) times daily. (Patient taking differently: Take 17 g by mouth daily as needed for mild constipation.) 14 each 0   potassium chloride  SA (KLOR-CON  M) 20 MEQ tablet Take 1 tablet (20 mEq total) by mouth daily. 90 tablet 3   QUEtiapine  (SEROQUEL ) 50 MG tablet TAKE 1 TABLET AT BEDTIME 90 tablet 1   warfarin (COUMADIN ) 5 MG tablet TAKE 1 TABLET (5 MG) BY MOUTH DAILY EXCEPT TAKE 1 1/2 TABLETS (7.5 MG) ON SUNDAY OR AS DIRECTED BY ANTICOAGULATION CLINIC 100 tablet 1   No current  facility-administered medications for this visit.    Medication Side Effects: None  Allergies:  Allergies  Allergen Reactions   Hydromorphone  Other (See Comments)    Stroke-like symptoms. treated with Narcan; Tolerated Percocet in the past.   Iodinated Contrast Media Other (See Comments)    Reaction not fully known   Prochlorperazine Swelling and Other (See Comments)    Compazine - tongue swelling    Past Medical History:  Diagnosis Date   A-fib (HCC)    Allergy    Anxiety    Bipolar disorder (HCC)    Chondromalacia patellae 12/14/2003   Depression    DVT (deep vein thrombosis) in pregnancy    DVT (deep venous thrombosis) (HCC) 2001 and 2002   DVT (deep venous thrombosis) (HCC)    x 2   Enlarged pancreas 07/02/2023  Fulness of pancreatic head on CT    Esophagitis 06/06/2016   GERD (gastroesophageal reflux disease)    Hemorrhage    Hyperlipidemia    Migraines    Pulmonary embolism (HCC) 2001   S/P cholecystectomy 09/11/2012   Sickle cell anemia (HCC)    Suicidal ideation 12/28/2008   Tremor     Past Medical History, Surgical history, Social history, and Family history were reviewed and updated as appropriate.   Please see review of systems for further details on the patient's review from today.   Objective:   Physical Exam:  LMP  (LMP Unknown)   Physical Exam Constitutional:      General: She is not in acute distress.  Musculoskeletal:        General: No deformity.   Neurological:     Mental Status: She is alert and oriented to person, place, and time.     Coordination: Coordination normal.   Psychiatric:        Attention and Perception: Attention and perception normal. She does not perceive auditory or visual hallucinations.        Mood and Affect: Mood is anxious. Mood is not depressed. Affect is not labile, blunt, angry or inappropriate.        Speech: Speech normal.        Behavior: Behavior normal.        Thought Content: Thought content  normal. Thought content is not paranoid or delusional. Thought content does not include homicidal or suicidal ideation. Thought content does not include homicidal or suicidal plan.        Cognition and Memory: Cognition and memory normal.        Judgment: Judgment normal.     Comments: Insight intact     Lab Review:     Component Value Date/Time   NA 141 08/23/2023 1009   K 4.0 08/23/2023 1009   CL 106 08/23/2023 1009   CO2 19 (L) 08/23/2023 1009   GLUCOSE 95 08/23/2023 1009   GLUCOSE 127 (H) 08/15/2023 1514   BUN 14 08/23/2023 1009   CREATININE 1.04 (H) 08/23/2023 1009   CALCIUM  9.1 08/23/2023 1009   PROT 7.0 08/15/2023 1432   PROT 7.6 08/14/2023 1341   ALBUMIN  4.2 08/15/2023 1432   ALBUMIN  4.8 08/14/2023 1341   AST 26 08/15/2023 1432   ALT 9 08/15/2023 1432   ALKPHOS 55 08/15/2023 1432   BILITOT 0.7 08/15/2023 1432   BILITOT 0.3 08/14/2023 1341   GFRNONAA >60 08/15/2023 1432   GFRAA >60 12/10/2019 1600       Component Value Date/Time   WBC 8.4 08/15/2023 1432   RBC 4.54 08/15/2023 1432   HGB 13.9 08/15/2023 1514   HGB 13.0 04/27/2022 0735   HCT 41.0 08/15/2023 1514   HCT 39.7 04/27/2022 0735   PLT 164 08/15/2023 1432   PLT 239 04/27/2022 0735   MCV 81.1 08/15/2023 1432   MCV 80 04/27/2022 0735   MCH 26.9 08/15/2023 1432   MCHC 33.2 08/15/2023 1432   RDW 18.1 (H) 08/15/2023 1432   RDW 16.0 (H) 04/27/2022 0735   LYMPHSABS 2.5 08/15/2023 1432   MONOABS 0.4 08/15/2023 1432   EOSABS 0.1 08/15/2023 1432   BASOSABS 0.0 08/15/2023 1432    No results found for: POCLITH, LITHIUM   No results found for: PHENYTOIN, PHENOBARB, VALPROATE, CBMZ   .res Assessment: Plan:    Plan:  PDMP reviewed  Continue:  Increase Seroquel  50mg  to 100mg  at bedtime   D/C Ativan  0.5mg  TID  temporarily with increased anxiety   Will plan to switch from Ativan  after current prescription completed - then add Clonazepam 0.5mg  BID.  Caplyta  42mg  daily - using samples due  to cost Lamictal  200mg  twice daily Lexapro  10mg  daily Buspar  30mg  BID for anxiety - dose increase  RTC 4 to 6 weeks  35 minutes spent dedicated to the care of this patient on the date of this encounter to include pre-visit review of records, ordering of medication, post visit documentation, and face-to-face time with the patient discussing Bipolar effective disorder, cognitive disorder, GAD, and insomnia. Discussed continuing current medication regimen. Also spoke with patient's husband about her current medications and treatment.   Seeing therapist - Reid Capuchin  Patient advised to contact office with any questions, adverse effects, or acute worsening in signs and symptoms.   Discussed potential benefits, risk, and side effects of benzodiazepines to include potential risk of tolerance and dependence, as well as possible drowsiness. Advised patient not to drive if experiencing drowsiness and to take lowest possible effective dose to minimize risk of dependence and tolerance.   Discussed potential metabolic side effects associated with atypical antipsychotics, as well as potential risk for movement side effects. Advised pt to contact office if movement side effects occur.    There are no diagnoses linked to this encounter.   Please see After Visit Summary for patient specific instructions.  Future Appointments  Date Time Provider Department Center  08/30/2023  8:30 AM [facility] None  09/11/2023 10:40 AM Henson, Vickie L, NP-C [facility] None  09/30/2023  9:00 AM HVC CT 1 [facility] H&V  10/01/2023 10:20 AM Ladd Picker, MD [facility] None  10/09/2023  2:40 PM Phebe Brasil, MD [facility] None  02/19/2024  8:40 AM Maree Shames, Vickie L, NP-C [facility] None    No orders of the defined types were placed in this encounter.   -------------------------------

## 2023-08-29 NOTE — Telephone Encounter (Signed)
 Copied from CRM [id number]. Topic: Clinical - Request for Lab/Test Order >> Aug 29, 2023 11:48 AM Albertha Alosa wrote: Reason for CRM: Patient called in regarding lab appointment would like to get some labs tomorrow while she is close to the office woudlike a callback if she can be scheduled

## 2023-08-29 NOTE — Telephone Encounter (Signed)
 Patient is calling back in to see if she can get labs done

## 2023-08-30 ENCOUNTER — Telehealth: Payer: Self-pay | Admitting: Adult Health

## 2023-08-30 ENCOUNTER — Ambulatory Visit

## 2023-08-30 DIAGNOSIS — Z7901 Long term (current) use of anticoagulants: Secondary | ICD-10-CM | POA: Diagnosis not present

## 2023-08-30 DIAGNOSIS — F310 Bipolar disorder, current episode hypomanic: Secondary | ICD-10-CM

## 2023-08-30 DIAGNOSIS — F411 Generalized anxiety disorder: Secondary | ICD-10-CM

## 2023-08-30 LAB — POCT INR: INR: 2.1 (ref 2.0–3.0)

## 2023-08-30 NOTE — Patient Instructions (Addendum)
 Pre visit review using our clinic review tool, if applicable. No additional management support is needed unless otherwise documented below in the visit note.  Continue 1 tablet daily except take 1 1/2 tablets on Sunday. Recheck in 5 weeks.

## 2023-08-30 NOTE — Telephone Encounter (Signed)
 Patient notified

## 2023-08-30 NOTE — Progress Notes (Signed)
 Pt has been taking amiodarone  on 5/16 and has had one dose change since starting it. This has a major interaction with warfarin. Pt is now stable with amiodarone  and warfarin dosing. Pt has an upcoming ablation on 8/11 and will need 3 weekly INRs before the procedure. First weekly INR should be around 7/22. Will need to send results to cardiology. Continue 1 tablet daily except take 1 1/2 tablets on Sunday. Recheck in 5 weeks.

## 2023-08-30 NOTE — Telephone Encounter (Signed)
 Pt called at 2:33p requesting refill of Buspar  and Seroquel  to [facility].  I told her I saw the Caplyta  was sent to [facility] with 11 refills, but she said she has been getting 4 or 5 boxes of samples from the office.  She would like to get that from [facility] (maybe it was too expensive as to why she didn't get it from them to begin with?).  She said she has about 10 days of Lorazepam  left and then Bonnell Butcher was going to switch her to Clonazepam.  She would like the script for Clonazepam sent to [facility] as well.  ( I forgot to ask, but I think she wants [facility] set up as her default pharmacy because she can get her meds cheaper there).  Next appt 7/31

## 2023-09-02 ENCOUNTER — Telehealth: Payer: Self-pay

## 2023-09-02 ENCOUNTER — Other Ambulatory Visit: Payer: Self-pay | Admitting: Adult Health

## 2023-09-02 DIAGNOSIS — F310 Bipolar disorder, current episode hypomanic: Secondary | ICD-10-CM

## 2023-09-02 DIAGNOSIS — I480 Paroxysmal atrial fibrillation: Secondary | ICD-10-CM

## 2023-09-02 MED ORDER — QUETIAPINE FUMARATE 50 MG PO TABS: ORAL_TABLET | ORAL | 0 refills | Status: DC

## 2023-09-02 MED ORDER — LUMATEPERONE TOSYLATE 42 MG PO CAPS: 42.0000 mg | ORAL_CAPSULE | Freq: Every day | ORAL | 0 refills | Status: DC

## 2023-09-02 MED ORDER — BUSPIRONE HCL 30 MG PO TABS: 30.0000 mg | ORAL_TABLET | Freq: Two times a day (BID) | ORAL | 0 refills | Status: DC

## 2023-09-02 NOTE — Telephone Encounter (Signed)
 Called pt to inform her that we need to move her Afib Ablation out from 8/11 to 8/19 per CL (had to put extraction on 8/11).  Her CT is scheduled on 7/21 at 9:00 am. She will have labs done while here on 7/21.  Her PCP manages her INR and has scheduled her 3 weekly INR's - I have messaged them and notified them of the schedule change.   Pt concerned about her HR being in the 30-40's while resting. She is having dizzy spells and lightheaded.  She would like to know if the expectation is for her HR to be normal after her Ablation and if not should she be prepared to get a Pacemaker. I informed her that I would send these concerns to Dr. Cindie and his RN.

## 2023-09-02 NOTE — Addendum Note (Signed)
 Addended by: CON BRISTLE T on: 09/02/2023 01:44 PM   Modules accepted: Orders

## 2023-09-03 ENCOUNTER — Other Ambulatory Visit: Payer: Self-pay

## 2023-09-03 ENCOUNTER — Ambulatory Visit: Attending: Cardiology

## 2023-09-03 ENCOUNTER — Telehealth: Payer: Self-pay

## 2023-09-03 DIAGNOSIS — I480 Paroxysmal atrial fibrillation: Secondary | ICD-10-CM

## 2023-09-03 MED ORDER — GABAPENTIN 300 MG PO CAPS: 300.0000 mg | ORAL_CAPSULE | Freq: Three times a day (TID) | ORAL | 3 refills | Status: DC

## 2023-09-03 NOTE — Telephone Encounter (Signed)
 Received staff msg from cardiology reporting pt's ablation has been rescheduled for 8/19, so her three weekly INR pre-procedure have to be changes a little.   Contacted pt and RS one of the weekly INR checks. Pt verbalized understanding.

## 2023-09-03 NOTE — Telephone Encounter (Signed)
-----   Message from OLE DASEN [facility] sent at 09/02/2023 10:07 PM EDT ----- Regarding: RE: Ablation - Heart Rates Can we order her a 3 day zio monitor to further assess her HR's? Thanks! OLE Holts ----- Message ----- From: Emily Rice, CMA Sent: 09/02/2023   4:03 PM EDT To: OLE DASEN Holts, MD; Doreatha Ridgel, RN Subject: Ablation - Heart Rates                         Pt scheduled for Afib Ablation on 8/19 - She is concerned about her HR being in the 30-40's while resting. She is having dizzy spells and lightheaded. Had MRI/Head and it was normal.   She would like to know if the expectation is for her HR to be normal after her Ablation and if not should she be prepared to get a Pacemaker.  I explained to her that if she is in A-fib she should be checking her pulse manually and not with a machine because it would not be accurate. She stated that her rates had been this low for a while even before she had Afib.   She acted like this was nothing new and had been going on for a while and that she was sure you were aware.SABRA   April

## 2023-09-03 NOTE — Telephone Encounter (Signed)
 Left message for patient to call back. Monitor has been ordered.

## 2023-09-04 ENCOUNTER — Ambulatory Visit: Payer: Self-pay | Admitting: Family Medicine

## 2023-09-04 ENCOUNTER — Ambulatory Visit (INDEPENDENT_AMBULATORY_CARE_PROVIDER_SITE_OTHER): Admitting: Family Medicine

## 2023-09-04 ENCOUNTER — Encounter: Payer: Self-pay | Admitting: Family Medicine

## 2023-09-04 ENCOUNTER — Other Ambulatory Visit: Payer: Self-pay

## 2023-09-04 ENCOUNTER — Telehealth (INDEPENDENT_AMBULATORY_CARE_PROVIDER_SITE_OTHER): Admitting: Neurology

## 2023-09-04 VITALS — BP 116/72 | HR 47 | Temp 97.6°F | Ht 63.0 in | Wt 169.0 lb

## 2023-09-04 DIAGNOSIS — N1831 Chronic kidney disease, stage 3a: Secondary | ICD-10-CM | POA: Insufficient documentation

## 2023-09-04 DIAGNOSIS — E876 Hypokalemia: Secondary | ICD-10-CM | POA: Diagnosis not present

## 2023-09-04 DIAGNOSIS — Z79899 Other long term (current) drug therapy: Secondary | ICD-10-CM

## 2023-09-04 DIAGNOSIS — Z7901 Long term (current) use of anticoagulants: Secondary | ICD-10-CM | POA: Diagnosis not present

## 2023-09-04 DIAGNOSIS — R413 Other amnesia: Secondary | ICD-10-CM | POA: Diagnosis not present

## 2023-09-04 DIAGNOSIS — G43709 Chronic migraine without aura, not intractable, without status migrainosus: Secondary | ICD-10-CM

## 2023-09-04 DIAGNOSIS — I480 Paroxysmal atrial fibrillation: Secondary | ICD-10-CM | POA: Diagnosis not present

## 2023-09-04 DIAGNOSIS — I10 Essential (primary) hypertension: Secondary | ICD-10-CM | POA: Diagnosis not present

## 2023-09-04 DIAGNOSIS — F3161 Bipolar disorder, current episode mixed, mild: Secondary | ICD-10-CM

## 2023-09-04 DIAGNOSIS — F3160 Bipolar disorder, current episode mixed, unspecified: Secondary | ICD-10-CM

## 2023-09-04 DIAGNOSIS — F319 Bipolar disorder, unspecified: Secondary | ICD-10-CM

## 2023-09-04 DIAGNOSIS — R7303 Prediabetes: Secondary | ICD-10-CM | POA: Diagnosis not present

## 2023-09-04 LAB — CBC
HCT: 38.9 % (ref 36.0–46.0)
Hemoglobin: 12.5 g/dL (ref 12.0–15.0)
MCHC: 32.2 g/dL (ref 30.0–36.0)
MCV: 81.7 fl (ref 78.0–100.0)
Platelets: 224 10*3/uL (ref 150.0–400.0)
RBC: 4.76 Mil/uL (ref 3.87–5.11)
RDW: 17.3 % — ABNORMAL HIGH (ref 11.5–15.5)
WBC: 6.7 10*3/uL (ref 4.0–10.5)

## 2023-09-04 LAB — BASIC METABOLIC PANEL WITH GFR
BUN: 11 mg/dL (ref 6–23)
CO2: 30 meq/L (ref 19–32)
Calcium: 9.3 mg/dL (ref 8.4–10.5)
Chloride: 104 meq/L (ref 96–112)
Creatinine, Ser: 1.05 mg/dL (ref 0.40–1.20)
GFR: 57.71 mL/min — ABNORMAL LOW (ref >60.00)
Glucose, Bld: 78 mg/dL (ref 70–99)
Potassium: 4.1 meq/L (ref 3.5–5.1)
Sodium: 143 meq/L (ref 135–145)

## 2023-09-04 MED ORDER — CLONAZEPAM 0.5 MG PO TABS: 0.5000 mg | ORAL_TABLET | Freq: Two times a day (BID) | ORAL | 1 refills | Status: DC

## 2023-09-04 NOTE — Assessment & Plan Note (Signed)
 Reviewed notes from neurology.  She has a virtual visit with her neurologist today regarding migraines.  States Nurtec and Emgality are not affordable.  Triptans contraindicated.

## 2023-09-04 NOTE — Assessment & Plan Note (Signed)
 Continue to monitor.  Check BMP

## 2023-09-04 NOTE — Assessment & Plan Note (Signed)
Controlled.  Continue current therapy.  

## 2023-09-04 NOTE — Assessment & Plan Note (Signed)
 No sign of bleeding issues.  Continue medication and follow-up with cardiology

## 2023-09-04 NOTE — Assessment & Plan Note (Signed)
 Continue healthy diet. Follow up with [facility]

## 2023-09-04 NOTE — Telephone Encounter (Signed)
 Clonazepam pended to [facility]

## 2023-09-04 NOTE — Assessment & Plan Note (Signed)
 Managed by psychiatrist
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
## 2023-09-04 NOTE — Progress Notes (Signed)
 Subjective:     Patient ID: Emily Rice, female    DOB: May 25, 1960, 63 y.o.   MRN: [id number]  Chief Complaint  Patient presents with   Hospitalization Follow-up    States she is no longer having that weakness, feeling a better than she was.     HPI  History of Present Illness          She would like to have her potassium checked. She has been taking potassium chloride  20 MEQ daily for the past 2 weeks since she had low potassium in the ED.  Dr. Cindie ordered BMP to have this rechecked as well as a CBC.  She would like to get these labs done here today instead of going to [facility].    She recently saw Dr. Onita, neurologist on 08/19/2023 for memory issues thought to be r/t mood disorder.  She is under the care of psychiatry.   Chronic migraines- she has a migraine headache on the right today. Started this morning. She was advised to avoid triptans and per neurologist.  Nurtec and Emgality are not affordable.  She has a virtual appt with neurology.   Tremors- she is off Sinemet  due to no parkinsonian features.   Scheduled for cardiac ablation in August.   She takes Ativan  up to tid for worsening anxiety.   Neuropsychologist Dr. Maude Donovan Kathleen Rattler. No neurodegenerative conditions. Polypharmacy- Caplyta  42mg  daily - using samples due to cost Seroquel  50mg  at bedtime  Lamictal  200mg  twice daily Lexapro  10mg  daily Buspar  30mg  BID for anxiety     Health Maintenance Due  Topic Date Due   Cervical Cancer Screening (HPV/Pap Cotest)  Never done   Pneumococcal Vaccine 1-64 Years old (3 of 3 - PCV) 09/03/2013    Past Medical History:  Diagnosis Date   A-fib (HCC)    Allergy    Anxiety    Bipolar disorder (HCC)    Chondromalacia patellae 12/14/2003   Depression    DVT (deep vein thrombosis) in pregnancy    DVT (deep venous thrombosis) (HCC) 2001 and 2002   DVT (deep venous thrombosis) (HCC)    x 2   Enlarged pancreas 07/02/2023   Fulness of pancreatic  head on CT    Esophagitis 06/06/2016   GERD (gastroesophageal reflux disease)    Hemorrhage    Hyperlipidemia    Migraines    Pulmonary embolism (HCC) 2001   S/P cholecystectomy 09/11/2012   Sickle cell anemia (HCC)    Suicidal ideation 12/28/2008   Tremor     Past Surgical History:  Procedure Laterality Date   CHOLECYSTECTOMY  2014   CHOLECYSTECTOMY     HUMERUS IM NAIL Right 03/03/2020   Procedure: INTRAMEDULLARY (IM) NAIL HUMERAL;  Surgeon: Kendal Franky SQUIBB, MD;  Location: [facility] OR;  Service: Orthopedics;  Laterality: Right;   OVARIAN CYST REMOVAL     TONSILLECTOMY      Family History  Problem Relation Age of Onset   Hypertension Mother    Stroke Father    Hypertension Father    Atrial fibrillation Father    Heart disease Father    Diabetes Sister    CAD Brother    Anxiety disorder Daughter    ADD / ADHD Son    Depression Son    Hypertension Maternal Uncle    Liver disease Neg Hx    Esophageal cancer Neg Hx    Colon cancer Neg Hx     Social History   Socioeconomic History  Marital status: Married    Spouse name: Lola   Number of children: 2   Years of education: college   Highest education level: Master's degree (e.g., MA, MS, MEng, MEd, MSW, MBA)  Occupational History   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   Right-handed.   No daily use of caffeine .   Lives  with husband.-2025      Social Drivers of Health   Financial Resource Strain: Low Risk  (03/29/2023)   Overall Financial Resource Strain (CARDIA)    Difficulty of Paying Living Expenses: Not hard at all  Food Insecurity: No Food Insecurity (07/24/2023)   Hunger Vital Sign    Worried About Running Out of Food in the Last Year: Never true    Ran Out of Food in the Last Year: Never true  Transportation Needs: No Transportation Needs (07/24/2023)   PRAPARE - Administrator, Civil Service (Medical): No    Lack of Transportation (Non-Medical): No  Physical Activity: Sufficiently Active (03/29/2023)   Exercise Vital Sign    Days of Exercise per Week: 5 days    Minutes of Exercise per Session: 60 min  Stress: Stress Concern Present (03/29/2023)   [employer] of Occupational Health - Occupational Stress Questionnaire    Feeling of Stress : To some extent  Social Connections: Patient Declined (07/24/2023)   Social Connection and Isolation Panel    Frequency of Communication with Friends and Family: Patient declined    Frequency of Social Gatherings with Friends and Family: Patient declined    Attends Religious Services: Patient declined    Database administrator or Organizations: Patient declined    Attends Banker Meetings: Patient declined    Marital Status: Patient declined  Intimate Partner Violence: Not At Risk (07/24/2023)   Humiliation, Afraid, Rape, and Kick questionnaire    Fear of Current or Ex-Partner: No    Emotionally Abused: No    Physically Abused: No    Sexually Abused: No    Outpatient Medications Prior to Visit  Medication Sig Dispense Refill   acetaminophen  (TYLENOL ) 325 MG tablet Take 2 tablets (650 mg total) by mouth every 6 (six) hours as needed for mild pain or moderate pain.     amiodarone  (PACERONE ) 200 MG tablet Take 1 tablet (200 mg total) by mouth daily. 90 tablet 1   aspirin -acetaminophen -caffeine  (EXCEDRIN  MIGRAINE) 250-250-65 MG  tablet Take 2 tablets by mouth 2 (two) times daily as needed for migraine.     botulinum toxin Type A  (BOTOX ) 200 units injection Inject 200 Units into the muscle every 3 (three) months. 1 each 3   busPIRone  (BUSPAR ) 30 MG tablet Take 1 tablet (30 mg total) by mouth 2 (two) times daily. 180 tablet 0   calcium  carbonate (TUMS)  500 MG chewable tablet Chew 1 tablet (200 mg of elemental calcium  total) by mouth daily. 90 tablet 3   cetirizine (ZYRTEC) 10 MG tablet Take 10 mg by mouth daily as needed for allergies.     Cholecalciferol  75 MCG (3000 UT) TABS Take 3,000 Units by mouth daily. 30 tablet 0   diltiazem  (CARDIZEM  CD) 120 MG 24 hr capsule Take 1 capsule (120 mg total) by mouth daily. 90 capsule 3   diphenhydrAMINE  (BENADRYL  ALLERGY EXTRA STR) 50 MG tablet Take 1 tablet (50 mg total) by mouth once for 1 dose. Take 1 tablet 1 hour prior to CT scan. 1 tablet 0   enoxaparin  (LOVENOX ) 120 MG/0.8ML injection Inject 0.8 mLs (120 mg total) into the skin daily for 3 days. 2.4 mL 0   escitalopram  (LEXAPRO ) 10 MG tablet TAKE 1 TABLET AT BEDTIME 90 tablet 0   fexofenadine (ALLEGRA) 180 MG tablet Take 180 mg by mouth as needed for allergies or rhinitis.     fluticasone  (FLONASE ) 50 MCG/ACT nasal spray Place 1 spray into both nostrils daily as needed for allergies.      gabapentin  (NEURONTIN ) 300 MG capsule Take 1 capsule (300 mg total) by mouth 3 (three) times daily. 90 capsule 3   lamoTRIgine  (LAMICTAL ) 200 MG tablet TAKE 1 TABLET TWICE DAILY 180 tablet 0   LORazepam  (ATIVAN ) 0.5 MG tablet Take 1 tablet (0.5 mg total) by mouth 3 (three) times daily. 90 tablet 1   lumateperone  tosylate (CAPLYTA ) 42 MG capsule Take 1 capsule (42 mg total) by mouth daily. 90 capsule 0   metFORMIN  (GLUCOPHAGE -XR) 500 MG 24 hr tablet Take 1 tablet (500 mg total) by mouth 2 (two) times daily with a meal.     Multiple Vitamin (MULTIVITAMIN WITH MINERALS) TABS tablet Take 1 tablet by mouth daily.     polyethylene glycol (MIRALAX  /  GLYCOLAX ) 17 g packet Take 17 g by mouth 2 (two) times daily. (Patient taking differently: Take 17 g by mouth daily as needed for mild constipation.) 14 each 0   potassium chloride  SA (KLOR-CON  M) 20 MEQ tablet Take 1 tablet (20 mEq total) by mouth daily. 90 tablet 3   QUEtiapine  (SEROQUEL ) 50 MG tablet Take one to two tablets at bedtime. 180 tablet 0   warfarin (COUMADIN ) 5 MG tablet TAKE 1 TABLET (5 MG) BY MOUTH DAILY EXCEPT TAKE 1 1/2 TABLETS (7.5 MG) ON SUNDAY OR AS DIRECTED BY ANTICOAGULATION CLINIC 100 tablet 1   carbidopa -levodopa  (SINEMET  IR) 25-100 MG tablet Take one tablet in the am and one tablet in the pm (Patient taking differently: Take 1 tablet by mouth 3 (three) times daily. Take one tablet in the am and one tablet in the pm) 60 tablet 4   divalproex  (DEPAKOTE ) 500 MG DR tablet Take 1 tablet (500 mg total) by mouth at bedtime as needed. 20 tablet 0   Galcanezumab-gnlm (EMGALITY) 120 MG/ML SOAJ Inject 1 Pen into the skin every 30 (thirty) days. 1 mL 11   No facility-administered medications prior to visit.    Allergies  Allergen Reactions   Hydromorphone  Other (See Comments)    Stroke-like symptoms. treated with Narcan; Tolerated Percocet in the past.   Iodinated Contrast Media Other (See Comments)    Reaction not fully known   Prochlorperazine Swelling and Other (See Comments)    Compazine - tongue swelling    Review of Systems  Constitutional:  Positive for malaise/fatigue. Negative for chills and fever.  HENT:  Negative for congestion and  sore throat.   Eyes:  Negative for blurred vision, double vision and photophobia.  Respiratory:  Negative for cough and shortness of breath.   Cardiovascular:  Negative for chest pain, palpitations and leg swelling.  Gastrointestinal:  Negative for abdominal pain, constipation, diarrhea, nausea and vomiting.  Musculoskeletal:  Negative for falls.  Neurological:  Positive for dizziness and headaches. Negative for sensory change, focal  weakness and loss of consciousness.  Psychiatric/Behavioral:  Positive for depression and memory loss. The patient is nervous/anxious.        Objective:    Physical Exam Constitutional:      General: She is not in acute distress.    Appearance: She is not ill-appearing.  HENT:     Nose: Nose normal.     Mouth/Throat:     Mouth: Mucous membranes are moist.     Pharynx: Oropharynx is clear.   Eyes:     Extraocular Movements: Extraocular movements intact.     Conjunctiva/sclera: Conjunctivae normal.    Cardiovascular:     Rate and Rhythm: Normal rate and regular rhythm.  Pulmonary:     Effort: Pulmonary effort is normal.     Breath sounds: Normal breath sounds.   Musculoskeletal:     Cervical back: Normal range of motion and neck supple.     Right lower leg: No edema.     Left lower leg: No edema.   Skin:    General: Skin is warm and dry.   Neurological:     General: No focal deficit present.     Mental Status: She is alert and oriented to person, place, and time.     Cranial Nerves: No cranial nerve deficit.     Sensory: No sensory deficit.     Motor: No weakness.     Coordination: Coordination normal.   Psychiatric:        Mood and Affect: Mood normal.        Behavior: Behavior normal.        Thought Content: Thought content normal.      BP 116/72 (BP Location: Left Arm, Patient Position: Sitting)   Pulse (!) 47   Temp 97.6 F (36.4 C) (Temporal)   Ht 5' 3 (1.6 m)   Wt 169 lb (76.7 kg)   LMP  (LMP Unknown)   SpO2 98%   BMI 29.94 kg/m  Wt Readings from Last 3 Encounters:  09/04/23 169 lb (76.7 kg)  08/26/23 166 lb (75.3 kg)  08/19/23 168 lb (76.2 kg)        Assessment & Plan:   Problem List Items Addressed This Visit     Benign essential hypertension   Controlled. Continue current therapy.       Relevant Orders   Basic metabolic panel with GFR (Completed)   CBC (Completed)   Bipolar affective disorder, current episode mixed (HCC)    Managed by psychiatrist.      Chronic migraine w/o aura w/o status migrainosus, not intractable   Reviewed notes from neurology.  She has a virtual visit with her neurologist today regarding migraines.  States Nurtec and Emgality are not affordable.  Triptans contraindicated.      Hypokalemia - Primary   Reviewed notes from ED visit regarding hypokalemia.  Currently taking potassium supplement.  Check potassium and follow-up      Relevant Orders   Basic metabolic panel with GFR (Completed)   Long term (current) use of anticoagulants   No sign of bleeding issues.  Continue medication and  follow-up with cardiology      Paroxysmal atrial fibrillation (HCC)   Continue current medications and follow up with cardiology       Relevant Orders   Basic metabolic panel with GFR (Completed)   CBC (Completed)   Prediabetes   Continue healthy diet. Follow up with [facility]      Relevant Orders   Basic metabolic panel with GFR (Completed)   CBC (Completed)   Stage 3a chronic kidney disease (HCC)   Continue to monitor.  Check BMP      Relevant Orders   Basic metabolic panel with GFR (Completed)      I have discontinued Raseel L. Midkiff's carbidopa -levodopa , divalproex , and Emgality. I am also having her maintain her fluticasone , fexofenadine, acetaminophen , Cholecalciferol , multivitamin with minerals, polyethylene glycol, cetirizine, diphenhydrAMINE , aspirin -acetaminophen -caffeine , lamoTRIgine , enoxaparin , calcium  carbonate, warfarin, LORazepam , amiodarone , escitalopram , metFORMIN , botulinum toxin Type A , diltiazem , potassium chloride  SA, QUEtiapine , busPIRone , lumateperone  tosylate, and gabapentin .  No orders of the defined types were placed in this encounter.

## 2023-09-04 NOTE — Assessment & Plan Note (Signed)
 Reviewed notes from ED visit regarding hypokalemia.  Currently taking potassium supplement.  Check potassium and follow-up

## 2023-09-04 NOTE — Patient Instructions (Signed)
 Please go downstairs for labs   I will forward your results to Dr. Cindie.

## 2023-09-04 NOTE — Assessment & Plan Note (Signed)
 Continue current medications and follow-up with cardiology

## 2023-09-05 ENCOUNTER — Telehealth: Payer: Self-pay | Admitting: Cardiology

## 2023-09-05 ENCOUNTER — Telehealth: Payer: Self-pay | Admitting: Neurology

## 2023-09-05 NOTE — Telephone Encounter (Signed)
 Pt calling waning to make sure and take the K supplement based on recent labs and also is wanting to know if should start taking meds for the kidney disease  Will forward to Dr Cindie for review and recommendations ./cy

## 2023-09-05 NOTE — Telephone Encounter (Signed)
 Pt c/o medication issue:  1. Name of Medication:   potassium chloride  SA (KLOR-CON  M) 20 MEQ tablet   2. How are you currently taking this medication (dosage and times per day)?   As prescribed  3. Are you having a reaction (difficulty breathing--STAT)?   4. What is your medication issue?   Patient wants a call back to discuss various medications and if she should still be taking her potassium medication.

## 2023-09-05 NOTE — Telephone Encounter (Signed)
 Referral for neurology fax to [facility]. Phone: [phone], Fax: [phone]

## 2023-09-09 ENCOUNTER — Telehealth: Payer: Self-pay | Admitting: Cardiology

## 2023-09-09 ENCOUNTER — Other Ambulatory Visit: Payer: Self-pay

## 2023-09-09 MED ORDER — AMIODARONE HCL 200 MG PO TABS: 200.0000 mg | ORAL_TABLET | Freq: Every day | ORAL | 3 refills | Status: DC

## 2023-09-09 NOTE — Telephone Encounter (Signed)
 Spoke with the patient who states that she needs to reschedule her ablation due to not having anyone to bring her or drive her home. Patient has been rescheduled to 9/24

## 2023-09-09 NOTE — Telephone Encounter (Signed)
 Pt would like a c/b regarding procedure being done sooner if possible. Please advise

## 2023-09-10 ENCOUNTER — Ambulatory Visit: Payer: Self-pay

## 2023-09-10 ENCOUNTER — Other Ambulatory Visit: Payer: Self-pay

## 2023-09-10 ENCOUNTER — Telehealth: Payer: Self-pay

## 2023-09-10 ENCOUNTER — Telehealth: Payer: Self-pay | Admitting: Adult Health

## 2023-09-10 ENCOUNTER — Encounter: Payer: Self-pay | Admitting: Cardiology

## 2023-09-10 ENCOUNTER — Telehealth: Payer: Self-pay | Admitting: Neurology

## 2023-09-10 NOTE — Telephone Encounter (Signed)
 Pharmacist called and and said that [facility] doesn't have the klonopin  in stock and ca't transfer the medicine.SABRA So [facility] pharmacy called to get  it transferred. So he talked to them and told them to cancel out the klonopin  in there system. Please [facility] in a new [facility] on [address]

## 2023-09-10 NOTE — Telephone Encounter (Signed)
 FYI Only or Action Required?: FYI only for provider.  Patient was last seen in primary care on 09/04/2023 by Lendia Boby CROME, NP-C. Called Nurse Triage reporting Dysphagia. Symptoms began x 2 days. Interventions attempted: Nothing. Symptoms are: unchanged.  Triage Disposition: See Physician Within 24 Hours  Patient/caregiver understands and will follow disposition?: Yes  ** Appt. With PCP scheduled for 7/2--                Copied from CRM [id number]. Topic: Clinical - Red Word Triage >> Sep 10, 2023  4:40 PM Berneda FALCON wrote: Red Word that prompted transfer to Nurse Triage: Pt says she is having pain in her mouth and difficulty swallowing worsening over the last couple of days. Reason for Disposition  [1] Swallowing difficulty AND [2] cause unknown  (Exception: Difficulty swallowing is a chronic symptom.)  Answer Assessment - Initial Assessment Questions 1. DESCRIPTION: Tell me more about this problem. Are you  having trouble swallowing liquids, solids, or both? Any trouble with swallowing saliva (spit)?     She stated this has been ongoing x 2 days, trouble swallowing on right side of moth and tongue.    2. SEVERITY: How bad is the swallowing difficulty?  (e.g., Scale 1-10; or mild, moderate, severe)   - MILD (0-3): Occasional swallowing difficulty; has trouble swallowing certain types of foods or liquids.   - MODERATE (4-7): Frequent swallowing difficulty; only able to swallow small amounts of foods and fluids.   - SEVERE (8-10): Unable to swallow any foods, fluids, or saliva; sensation of lump in throat or something stuck in throat, and frequent drooling or spitting may be present.      Mild-moderate   3. ONSET: When did the swallowing problems begin?      X 2 days ago    4. CAUSE: What do you think is causing the problem?  (e.g., dry mouth, food or pill stuck in throat, mouth pain, sore throat, progression of disease process such as dementia or  Parkinson's disease).       Unknown    5. CHRONIC or RECURRENT: Is this a new problem for you?  If No, ask: How long have you had this problem? (e.g., days, weeks, months)       New issue    6. OTHER SYMPTOMS: Do you have any other symptoms? (e.g., chest pain, difficulty breathing, mouth sores, sore throat, swollen tongue, chest pain)     Swollen glands she suspects.  Protocols used: Swallowing Difficulty-A-AH

## 2023-09-10 NOTE — Telephone Encounter (Signed)
 [facility] is stating that pt's medication Amiodarone  200 mg tablets taken with medication escitalopram  10 mg may cause QT prolongation and torsades. Is Dr. Cindie aware and does Dr. Cindie want to still prescribe amiodarone ? Please address    Ph# [phone]  Ref ID# [id number]

## 2023-09-10 NOTE — Telephone Encounter (Signed)
 Pt called in regards to  wanting to speak to MD about headaches / Migraines. PT has been experiencing really bad Headaches . Pt stated that this has been happening for months.  As month go by Pt states that she get at least 7-8  headaches a day  and they are on and off . Pt believe that Headaches could be Cluster Headaches  ,But is not sure . Pt states that there is a sharp pain  in right side of temple the shoots threw her body making her entire right side numb starting from her head to her feet, Pt would like to speak to MD about treatment .

## 2023-09-10 NOTE — Telephone Encounter (Signed)
 Cld Pt regarding headaches. Pt stated the pain starts in the right temple area as a sharp stabbing pain, will go away and come back 6-8 times/day. Stated pain radiates down right side and causes numbness in arm, hands, legs and this is different than the headaches she has had in the past. Pt is asking if this can be cluster headaches instead of migraines as it is characteristically different than before. Pt also stated is waiting on gabapentin  to be delivered, but she is not sure it will help. She is also concerned about taking gabapentin  in regards to her kidney issues. Pt also reported having sores on the right side of her tongue and mouth. Pt has appt w/PCP tomorrow in regards to the tongue and mouth issues. Pt reported she is taking Xanax  0.5 mg TID as recommended for anxiety and tremors but it has not helped. Pt is asking if Dr. Onita has recommendations for the different type of HA she is experiencing as it causes disruption and she is not able to function. Informed Pt a note will be sent to MD for review and any possible recommendations. Pt voiced understanding and thanks for the call.

## 2023-09-10 NOTE — Telephone Encounter (Signed)
 Pended clonazepam to [facility]

## 2023-09-10 NOTE — Telephone Encounter (Signed)
 Please call patient, reviewed most recent laboratory evaluation, normal creatinine, GFR 57, is okay with current dose of gabapentin , let her try gabapentin  first, if she still has a lot of question, we need to set up a virtual visit to go over

## 2023-09-10 NOTE — Telephone Encounter (Addendum)
 Thanks all involved for the update. Will contact pt to change dates for weekly INR checks before procedure.

## 2023-09-11 ENCOUNTER — Telehealth: Payer: Self-pay | Admitting: Neurology

## 2023-09-11 ENCOUNTER — Inpatient Hospital Stay: Admitting: Family Medicine

## 2023-09-11 ENCOUNTER — Ambulatory Visit: Admitting: Family Medicine

## 2023-09-11 ENCOUNTER — Telehealth: Admitting: Neurology

## 2023-09-11 DIAGNOSIS — I480 Paroxysmal atrial fibrillation: Secondary | ICD-10-CM | POA: Diagnosis not present

## 2023-09-11 LAB — CBC

## 2023-09-11 MED ORDER — CLONAZEPAM 0.5 MG PO TABS: 0.5000 mg | ORAL_TABLET | Freq: Two times a day (BID) | ORAL | 1 refills | Status: DC

## 2023-09-11 NOTE — Telephone Encounter (Signed)
 Patient cancelling Mychart visit today because Dr. Cindie ordered blood work today and pending results will determine if patient can take medication Dr. Onita prescribed. Please have Dr. Onita review the blood once in her chart can call patient to discuss.

## 2023-09-11 NOTE — Telephone Encounter (Signed)
 Contacted pt and advised to keep apt for coumadin  clinic on 7/29. Advised we will reschedule her weekly apts at that apt. Pt verbalized understanding.

## 2023-09-11 NOTE — Telephone Encounter (Signed)
 Called and spoke to pt and relayed recommendations, pt stated that she had further questions so a video visit was made for todat at 1:15pm

## 2023-09-11 NOTE — Telephone Encounter (Signed)
 Spoke with a pharmacist, Sari, at [facility] and advised that amiodarone  is okay to fill.

## 2023-09-12 ENCOUNTER — Ambulatory Visit: Payer: Self-pay | Admitting: *Deleted

## 2023-09-12 LAB — BASIC METABOLIC PANEL WITH GFR
BUN/Creatinine Ratio: 11 — ABNORMAL LOW (ref 12–28)
BUN: 12 mg/dL (ref 8–27)
CO2: 19 mmol/L — ABNORMAL LOW (ref 20–29)
Calcium: 9.1 mg/dL (ref 8.7–10.3)
Chloride: 107 mmol/L — ABNORMAL HIGH (ref 96–106)
Creatinine, Ser: 1.13 mg/dL — ABNORMAL HIGH (ref 0.57–1.00)
Glucose: 84 mg/dL (ref 70–99)
Potassium: 4.3 mmol/L (ref 3.5–5.2)
Sodium: 144 mmol/L (ref 134–144)
eGFR: 56 mL/min/{1.73_m2} — ABNORMAL LOW (ref >59)

## 2023-09-12 LAB — CBC
Hematocrit: 40.5 (ref 34.0–46.6)
Hemoglobin: 12.9 g/dL (ref 11.1–15.9)
MCH: 26.5 pg — AB (ref 26.6–33.0)
MCHC: 31.9 g/dL (ref 31.5–35.7)
MCV: 83 fL (ref 79–97)
Platelets: 237 10*3/uL (ref 150–450)
RBC: 4.86 x10E6/uL (ref 3.77–5.28)
RDW: 16.5 — AB (ref 11.7–15.4)
WBC: 5.9 10*3/uL (ref 3.4–10.8)

## 2023-09-17 ENCOUNTER — Encounter: Payer: Self-pay | Admitting: Cardiology

## 2023-09-18 ENCOUNTER — Telehealth: Payer: Self-pay | Admitting: Cardiology

## 2023-09-18 ENCOUNTER — Other Ambulatory Visit: Payer: Self-pay | Admitting: Family Medicine

## 2023-09-18 ENCOUNTER — Ambulatory Visit (HOSPITAL_COMMUNITY): Admitting: Physician Assistant

## 2023-09-18 NOTE — Telephone Encounter (Signed)
 Pt's medication was already sent to pt's pharmacy as requested. Confirmation received.

## 2023-09-18 NOTE — Telephone Encounter (Unsigned)
 Copied from CRM [id number]. Topic: Clinical - Medication Refill >> Sep 18, 2023  1:39 PM Shereese L wrote: Medication:omeprazole 40 mg  Has the patient contacted their pharmacy? Yes (Agent: If no, request that the patient contact the pharmacy for the refill. If patient does not wish to contact the pharmacy document the reason why and proceed with request.) (Agent: If yes, when and what did the pharmacy advise?)  This is the patient's preferred pharmacy:   [facility] - [place], [place] - [address] [place] [address] Phone: [phone] Fax: [phone]  Is this the correct pharmacy for this prescription? Yes If no, delete pharmacy and type the correct one.   Has the prescription been filled recently? Yes  Is the patient out of the medication? No  Has the patient been seen for an appointment in the last year OR does the patient have an upcoming appointment? Yes  Can we respond through MyChart? Yes  Agent: Please be advised that Rx refills may take up to 3 business days. We ask that you follow-up with your pharmacy.

## 2023-09-18 NOTE — Telephone Encounter (Signed)
*  STAT* If patient is at the pharmacy, call can be transferred to refill team.   1. Which medications need to be refilled? (please list name of each medication and dose if known) diltiazem  (CARDIZEM  CD) 120 MG 24 hr capsule    2. Would you like to learn more about the convenience, safety, & potential cost savings by using the [facility]?   3. Are you open to using the [facility] (Type [facility].  ).   4. Which pharmacy/location (including street and city if local pharmacy) is medication to be sent to? [facility] - [place], [place] - [address]    5. Do they need a 30 day or 90 day supply? 90 day

## 2023-09-20 ENCOUNTER — Encounter (HOSPITAL_COMMUNITY): Payer: Self-pay | Admitting: Physician Assistant

## 2023-09-20 ENCOUNTER — Ambulatory Visit (HOSPITAL_COMMUNITY)
Admission: RE | Admit: 2023-09-20 | Discharge: 2023-09-20 | Disposition: A | Discharge status: Home / Self Care | Source: Ambulatory Visit | Attending: Physician Assistant | Admitting: Physician Assistant

## 2023-09-20 VITALS — BP 136/78 | HR 58 | Ht 63.0 in | Wt 168.2 lb

## 2023-09-20 DIAGNOSIS — Z5181 Encounter for therapeutic drug level monitoring: Secondary | ICD-10-CM | POA: Diagnosis not present

## 2023-09-20 DIAGNOSIS — I480 Paroxysmal atrial fibrillation: Secondary | ICD-10-CM | POA: Diagnosis not present

## 2023-09-20 DIAGNOSIS — Z79899 Other long term (current) drug therapy: Secondary | ICD-10-CM

## 2023-09-20 MED ORDER — DILTIAZEM HCL 30 MG PO TABS: ORAL_TABLET | ORAL | 1 refills | Status: DC

## 2023-09-20 NOTE — Patient Instructions (Signed)
 Cardizem  30mg  -- Take 1 tablet every 4 hours AS NEEDED for AFIB heart rate >95 as long as top BP >100.

## 2023-09-20 NOTE — Progress Notes (Signed)
 Primary Care Physician: Lendia Boby CROME, NP-C Primary Cardiologist: Madonna Large, DO Electrophysiologist: OLE ONEIDA HOLTS, MD  Referring Physician: Dr HOLTS Wilbert Rice Emily is a 63 y.o. female with a history of HTN sickle cell trait, bipolar disorder, traumatic brain injury/SAH, atrial fibrillation who presents for follow up in the [facility].  The patient is currently on amiodarone  for rhythm control and is scheduled for an ablation on 12/04/23. Patient is on warfarin for stroke prevention.    Patient presents today for follow up for atrial fibrillation and amiodarone  monitoring. She reports that she has had more frequent afib episodes recently despite amiodarone . She is currently wearing a Zio monitor to assess her heart rates and arrhythmia burden. She does have a history of bradycardia and her diltiazem  has been decreased recently. No bleeding issues on anticoagulation.   Today, she denies symptoms of shortness of breath, orthopnea, PND, lower extremity edema, dizziness, presyncope, syncope, snoring, daytime somnolence, bleeding, or neurologic sequela. The patient is tolerating medications without difficulties and is otherwise without complaint today.    Atrial Fibrillation Risk Factors:  she does not have symptoms or diagnosis of sleep apnea. she does not have a history of rheumatic fever.   Atrial Fibrillation Management history:  Previous antiarrhythmic drugs: amiodarone   Previous cardioversions: none Previous ablations: none Anticoagulation history: warfarin   ROS- All systems are reviewed and negative except as per the HPI above.  Past Medical History:  Diagnosis Date   A-fib [facility])    Allergy    Anxiety    Bipolar disorder (HCC)    Chondromalacia patellae 12/14/2003   Depression    DVT (deep vein thrombosis) in pregnancy    DVT (deep venous thrombosis) (HCC) 2001 and 2002   DVT (deep venous thrombosis) (HCC)    x 2   Enlarged  pancreas 07/02/2023   Fulness of pancreatic head on CT    Esophagitis 06/06/2016   GERD (gastroesophageal reflux disease)    Hemorrhage    Hyperlipidemia    Migraines    Pulmonary embolism (HCC) 2001   S/P cholecystectomy 09/11/2012   Sickle cell anemia (HCC)    Suicidal ideation 12/28/2008   Tremor     Current Outpatient Medications  Medication Sig Dispense Refill   acetaminophen  (TYLENOL ) 325 MG tablet Take 2 tablets (650 mg total) by mouth every 6 (six) hours as needed for mild pain or moderate pain.     amiodarone  (PACERONE ) 200 MG tablet Take 1 tablet (200 mg total) by mouth daily. 90 tablet 3   aspirin -acetaminophen -caffeine  (EXCEDRIN  MIGRAINE) 250-250-65 MG tablet Take 2 tablets by mouth 2 (two) times daily as needed for migraine.     botulinum toxin Type A  (BOTOX ) 200 units injection Inject 200 Units into the muscle every 3 (three) months. 1 each 3   busPIRone  (BUSPAR ) 30 MG tablet Take 1 tablet (30 mg total) by mouth 2 (two) times daily. 180 tablet 0   calcium  carbonate (TUMS) 500 MG chewable tablet Chew 1 tablet (200 mg of elemental calcium  total) by mouth daily. 90 tablet 3   cetirizine (ZYRTEC) 10 MG tablet Take 10 mg by mouth daily as needed for allergies.     Cholecalciferol  75 MCG (3000 UT) TABS Take 3,000 Units by mouth daily. 30 tablet 0   clonazePAM  (KLONOPIN ) 0.5 MG tablet Take 1 tablet (0.5 mg total) by mouth 2 (two) times daily. 60 tablet 1   diltiazem  (CARDIZEM  CD) 120 MG 24 hr capsule Take  1 capsule (120 mg total) by mouth daily. 90 capsule 3   diltiazem  (CARDIZEM ) 30 MG tablet Take 1 tablet every 4 hours AS NEEDED for AFIB heart rate >95 as long as top BP >100. 45 tablet 1   diphenhydrAMINE  (BENADRYL  ALLERGY EXTRA STR) 50 MG tablet Take 1 tablet (50 mg total) by mouth once for 1 dose. Take 1 tablet 1 hour prior to CT scan. 1 tablet 0   escitalopram  (LEXAPRO ) 10 MG tablet TAKE 1 TABLET AT BEDTIME 90 tablet 0   fexofenadine (ALLEGRA) 180 MG tablet Take 180 mg by  mouth as needed for allergies or rhinitis.     fluticasone  (FLONASE ) 50 MCG/ACT nasal spray Place 1 spray into both nostrils daily as needed for allergies.      gabapentin  (NEURONTIN ) 300 MG capsule Take 1 capsule (300 mg total) by mouth 3 (three) times daily. 90 capsule 3   lamoTRIgine  (LAMICTAL ) 200 MG tablet TAKE 1 TABLET TWICE DAILY 180 tablet 0   LORazepam  (ATIVAN ) 0.5 MG tablet Take 1 tablet (0.5 mg total) by mouth 3 (three) times daily. 90 tablet 1   lumateperone  tosylate (CAPLYTA ) 42 MG capsule Take 1 capsule (42 mg total) by mouth daily. 90 capsule 0   metFORMIN  (GLUCOPHAGE -XR) 500 MG 24 hr tablet Take 1 tablet (500 mg total) by mouth 2 (two) times daily with a meal.     Multiple Vitamin (MULTIVITAMIN WITH MINERALS) TABS tablet Take 1 tablet by mouth daily.     polyethylene glycol (MIRALAX  / GLYCOLAX ) 17 g packet Take 17 g by mouth 2 (two) times daily. 14 each 0   potassium chloride  SA (KLOR-CON  M) 20 MEQ tablet Take 1 tablet (20 mEq total) by mouth daily. 90 tablet 3   QUEtiapine  (SEROQUEL ) 50 MG tablet Take one to two tablets at bedtime. 180 tablet 0   warfarin (COUMADIN ) 5 MG tablet TAKE 1 TABLET (5 MG) BY MOUTH DAILY EXCEPT TAKE 1 1/2 TABLETS (7.5 MG) ON SUNDAY OR AS DIRECTED BY ANTICOAGULATION CLINIC 100 tablet 1   enoxaparin  (LOVENOX ) 120 MG/0.8ML injection Inject 0.8 mLs (120 mg total) into the skin daily for 3 days. 2.4 mL 0   No current facility-administered medications for this encounter.    Physical Exam: BP 136/78   Pulse (!) 58   Ht 5' 3 (1.6 m)   Wt 76.3 kg   LMP  (LMP Unknown)   BMI 29.80 kg/m   GEN: Well nourished, well developed in no acute distress NECK: No JVD CARDIAC: Regular rate and rhythm, no murmurs, rubs, gallops RESPIRATORY:  Clear to auscultation without rales, wheezing or rhonchi  ABDOMEN: Soft, non-tender, non-distended EXTREMITIES:  No edema; No deformity   Wt Readings from Last 3 Encounters:  09/20/23 76.3 kg  09/04/23 76.7 kg  08/26/23  75.3 kg     EKG today demonstrates  SB Vent. rate 58 BPM PR interval 136 ms QRS duration 78 ms QT/QTcB 432/424 ms  Echo 07/23/23 demonstrated   1. Left ventricular ejection fraction, by estimation, is 55 to 60%. The  left ventricle has normal function. The left ventricle has no regional  wall motion abnormalities. Left ventricular diastolic parameters were  normal.   2. Right ventricular systolic function is normal. The right ventricular  size is normal. There is normal pulmonary artery systolic pressure.   3. The mitral valve is normal in structure. No evidence of mitral valve  regurgitation. No evidence of mitral stenosis.   4. The aortic valve is tricuspid. Aortic valve  regurgitation is not  visualized. No aortic stenosis is present.   5. The inferior vena cava is normal in size with greater than 50%  respiratory variability, suggesting right atrial pressure of 3 mmHg.   6. PACs and PVCs noted in study.    CHA2DS2-VASc Score = 2  The patient's score is based upon: CHF History: 0 HTN History: 1 Diabetes History: 0 Stroke History: 0 Vascular Disease History: 0 Age Score: 0 Gender Score: 1       ASSESSMENT AND PLAN: Paroxysmal Atrial Fibrillation (ICD10:  I48.0) The patient's CHA2DS2-VASc score is 2, indicating a 2.2% annual risk of stroke.   Patient in SR today but having frequent episodes of tachypalpitations.  She is scheduled for afib ablation on 9/24 We discussed rhythm control options. Will see what her burden/rates are on the cardiac monitor she is currently wearing. Can consider increasing amiodarone  until ablation. Continue amiodarone  200 mg daily Continue diltiazem  120 mg daily Will start diltiazem  30 mg PRN q 4 hours for heart racing.  Continue warfarin  High Risk Medication Monitoring (ICD 10: Z79.899) Intervals on ECG acceptable for amiodarone  monitoring.   HTN Stable on current regimen    Follow up in the AF clinic in one month.        [facility]  [facility] [address] [place], [place] [address] [phone]

## 2023-09-21 ENCOUNTER — Other Ambulatory Visit (INDEPENDENT_AMBULATORY_CARE_PROVIDER_SITE_OTHER): Payer: Self-pay | Admitting: Internal Medicine

## 2023-09-21 DIAGNOSIS — R7303 Prediabetes: Secondary | ICD-10-CM

## 2023-09-21 DIAGNOSIS — Z6831 Body mass index (BMI) 31.0-31.9, adult: Secondary | ICD-10-CM

## 2023-09-24 ENCOUNTER — Other Ambulatory Visit (HOSPITAL_COMMUNITY): Payer: Self-pay

## 2023-09-24 ENCOUNTER — Telehealth: Payer: Self-pay | Admitting: Cardiology

## 2023-09-24 ENCOUNTER — Telehealth: Payer: Self-pay

## 2023-09-24 ENCOUNTER — Ambulatory Visit

## 2023-09-24 NOTE — Telephone Encounter (Signed)
 [employer] reached out to clinic regarding auth for Amiodarone . Can't find existing PA info on file. If sig has not changed, claim should still be paying without auth. Per test bill details, plan paid a 90 day supply of drug on 09/11/23. Calling plan to get further info.

## 2023-09-24 NOTE — Telephone Encounter (Signed)
 Pt c/o medication issue:  1. Name of Medication: amiodarone  (PACERONE ) 200 MG tablet   2. How are you currently taking this medication (dosage and times per day)?  Take 1 tablet (200 mg total) by mouth daily.      3. Are you having a reaction (difficulty breathing--STAT)? No  4. What is your medication issue? Sandy from [facility] is requesting a callback regarding them having a series of clinical questions to be answered in order to complete PA for this medication. Reference number is [id number]. Please advise

## 2023-09-24 NOTE — Telephone Encounter (Signed)
 Spoke to [facility], did not answer any questions, but they did clarify that they are calling b/c they are getting an interaction w/ Lexapro  and Amiodarone . Forwarding to pharmD to address/follow up

## 2023-09-24 NOTE — Telephone Encounter (Signed)
 [employer] reached out to clinic regarding auth for Amiodarone . Can't find existing PA info on file. If sig has not changed, claim should still be paying without auth. Per test bill details, plan paid a 90 day supply of drug on 09/11/23.  Calling plan to get further info.    A second fill was initiated for Amiodarone  with [facility] and that triggered the system to pick up the major drug interaction with lexapro  at that time. If we didn't complete PA, the next fill wouldn't pay on the Amiodarone  now that they have flagged an interaction on it with lexapro . [employer] needed confirmation that patient would be on both medications. Confirmed patient has an active rx for both drugs and confirmed diagnosis for Amiodarone . PA submitted via phone. Status is pending.

## 2023-09-26 NOTE — Telephone Encounter (Signed)
 Fax from plan. PA approved till 03/11/24.

## 2023-09-27 ENCOUNTER — Other Ambulatory Visit (HOSPITAL_COMMUNITY): Payer: Self-pay | Admitting: Physician Assistant

## 2023-09-30 ENCOUNTER — Other Ambulatory Visit (HOSPITAL_COMMUNITY)

## 2023-10-01 ENCOUNTER — Encounter (INDEPENDENT_AMBULATORY_CARE_PROVIDER_SITE_OTHER): Payer: Self-pay | Admitting: Internal Medicine

## 2023-10-01 ENCOUNTER — Ambulatory Visit

## 2023-10-01 ENCOUNTER — Ambulatory Visit (INDEPENDENT_AMBULATORY_CARE_PROVIDER_SITE_OTHER): Admitting: Internal Medicine

## 2023-10-01 ENCOUNTER — Other Ambulatory Visit (HOSPITAL_COMMUNITY): Payer: Self-pay

## 2023-10-01 ENCOUNTER — Telehealth: Payer: Self-pay | Admitting: Pharmacy Technician

## 2023-10-01 VITALS — BP 131/79 | HR 59 | Temp 98.4°F | Ht 63.0 in | Wt 167.0 lb

## 2023-10-01 DIAGNOSIS — Z6831 Body mass index (BMI) 31.0-31.9, adult: Secondary | ICD-10-CM

## 2023-10-01 DIAGNOSIS — T50905A Adverse effect of unspecified drugs, medicaments and biological substances, initial encounter: Secondary | ICD-10-CM

## 2023-10-01 DIAGNOSIS — Z86718 Personal history of other venous thrombosis and embolism: Secondary | ICD-10-CM | POA: Diagnosis not present

## 2023-10-01 DIAGNOSIS — R635 Abnormal weight gain: Secondary | ICD-10-CM

## 2023-10-01 DIAGNOSIS — E66811 Obesity, class 1: Secondary | ICD-10-CM

## 2023-10-01 DIAGNOSIS — R7303 Prediabetes: Secondary | ICD-10-CM | POA: Diagnosis not present

## 2023-10-01 DIAGNOSIS — I480 Paroxysmal atrial fibrillation: Secondary | ICD-10-CM | POA: Diagnosis not present

## 2023-10-01 DIAGNOSIS — N1831 Chronic kidney disease, stage 3a: Secondary | ICD-10-CM

## 2023-10-01 DIAGNOSIS — E876 Hypokalemia: Secondary | ICD-10-CM | POA: Diagnosis not present

## 2023-10-01 MED ORDER — WEGOVY 0.25 MG/0.5ML ~~LOC~~ SOAJ
0.2500 mg | SUBCUTANEOUS | 0 refills | Status: DC
  Filled 2023-10-01: qty 2, 28d supply, fill #0

## 2023-10-01 NOTE — Telephone Encounter (Signed)
 LM for pt to return my call to discuss stopping Diltiazem  per Dr. Cindie due to low HR and symptoms pt stated she was having.

## 2023-10-01 NOTE — Progress Notes (Signed)
 Office: [phone]  /  Fax: [phone]  Weight Summary and Body Composition Analysis (BIA)  Vitals Temp: 98.4 F (36.9 C) BP: 131/79 Pulse Rate: (!) 59 SpO2: 97 %   Anthropometric Measurements Height: 5' 3 (1.6 m) Weight: 167 lb (75.8 kg) BMI (Calculated): 29.59 Weight at Last Visit: 166 lb Weight Lost Since Last Visit: 0 lb Weight Gained Since Last Visit: 1 lb Starting Weight: 179 lb Total Weight Loss (lbs): 12 lb (5.443 kg) Peak Weight: 203 lb   Body Composition  Body Fat %: 39.4 % Fat Mass (lbs): 65.8 lbs Muscle Mass (lbs): 96.2 lbs Total Body Water (lbs): 68.8 lbs Visceral Fat Rating : 10    RMR: 1325  Today's Visit #: 7  Starting Date: 03/14/23   Subjective   Chief Complaint: Obesity  Interval History Discussed the use of AI scribe software for clinical note transcription with the patient, who gave verbal consent to proceed.  History of Present Illness   Emily Rice is a 63 year old female with weight gain associated with psychotropic medication who presents for medical weight management.  She has experienced weight gain associated with her psychotropic medication regimen. Despite following a 1000 calorie nutrition plan with suboptimal adherence and exercising five days a week for 30 to 60 minutes, she has gained one pound since her last visit. She is focusing on consuming more whole foods. Her body fat percentage was approximately 41% when she started and is currently 39% per bioimpedance analysis.  She experiences frequent and prolonged episodes of atrial fibrillation, which start on the left side, radiate to the right side, and then to the sternum, causing significant pain and dyspnea. She needs to take deep breaths to catch her breath. She wore a Zeopatch last week and is scheduled for an ablation on September 24th. Her pulse is low, ranging from 44 to 45 at rest and increasing to the low 50s to 60s with activity. She is currently taking  amiodarone  and diltiazem  120 mg.  She has a history of chronic kidney disease stage 3A, with recent lab results showing a GFR decrease from 56 to 54. She takes potassium supplements due to previously low potassium levels. Her last hemoglobin A1c improved from 5.8 to 5.5.  She has a history of pituitary adenoma, monitored with annual imaging and biannual prolactin levels. She reports recent lab results have been 'funky' and expresses concern about her kidney function and prolactin levels.  Her current medication regimen includes metformin  500 mg twice daily, Klonopin  twice daily, buspirone , Seroquel , Caplyta , Lamictal , and Lexapro . She has not started gabapentin , which was prescribed for headaches, due to concerns about potential side effects on her kidneys and weight.        Challenges affecting patient progress: medical comorbidities and presence of obesogenic drugs.    Pharmacotherapy for weight management: She is currently taking Metformin  (off label use for incretin effect and / or insulin  resistance and / or diabetes prevention) with adequate clinical response  and without side effects..   Assessment and Plan   Treatment Plan For Obesity:  Recommended Dietary Goals  Emily Rice is currently in the action stage of change. As such, her goal is to continue weight management plan. She has agreed to: continue current plan  Behavioral Health and Counseling  We discussed the following behavioral modification strategies today: continue to work on maintaining a reduced calorie state, getting the recommended amount of protein, incorporating whole foods, making healthy choices, staying well hydrated and practicing mindfulness  when eating..  Additional education and resources provided today: None  Recommended Physical Activity Goals  Emily Rice has been advised to work up to 150 minutes of moderate intensity aerobic activity a week and strengthening exercises 2-3 times per week for cardiovascular  health, weight loss maintenance and preservation of muscle mass.   She has agreed to :  continue to gradually increase the amount and intensity of exercise routine  Medical Interventions and Pharmacotherapy  We discussed various medication options to help Emily Rice with her weight loss efforts and we both agreed to : Start anti-obesity medication.  In addition to reduced calorie nutrition plan (RCNP), behavioral strategies and physical activity, Emily Rice would benefit from pharmacotherapy to assist with hunger signals, satiety and cravings. This will reduce obesity-related health risks by inducing weight loss, and help reduce food consumption and adherence to Emily Rice) . It may also improve QOL by improving self-confidence and reduce the  setbacks associated with metabolic adaptations.  Emily Rice also has a number of high risk medical conditions associated with her obesity.  These include: Hypertension, stage IIIa chronic kidney disease, prediabetes, history of deep vein thrombosis, atrial fibrillation.  She is also on obesogenic medications which she requires for the treatment of her mental health.  This will make losing weight very difficult.  She has contraindications to first generation antiobesity medications because of tachyarrhythmia and kidney disease.  Treatment with semaglutide  will help her lose weight, improve blood pressure control, reduce the risk associated with chronic kidney disease and progression, reduce the risk of diabetes, reduce her cardiovascular risk.  After discussion of treatment options, mechanisms of action, benefits, side effects, contraindications and shared decision making she is agreeable to starting Wegovy  0.25 mg once a week. Patient also made aware that medication is indicated for long-term management of obesity and the risk of weight regain following discontinuation of treatment and hence the importance of adhering to medical weight loss plan.  We demonstrated use of device and  patient using teach back method was able to demonstrate proper technique.  Associated Conditions Impacted by Obesity Treatment  Assessment & Plan Prediabetes  Class 1 obesity with serious comorbidity and body mass index (BMI) of 31.0 to 31.9 in adult, unspecified obesity type  Weight gain due to medication  Paroxysmal atrial fibrillation (HCC)  History of DVT (deep vein thrombosis)  Chronic kidney disease, stage 3a (HCC)  Hypokalemia     See medical interventions and pharmacotherapy - Start Wegovy  0.25 mg once a week - Check disease monitoring labs today please refer to orders  Objective   Physical Exam:  Blood pressure 131/79, pulse (!) 59, temperature 98.4 F (36.9 C), height 5' 3 (1.6 m), weight 167 lb (75.8 kg), SpO2 97%. Body mass index is 29.58 kg/m.  General: She is overweight, cooperative, alert, well developed, and in no acute distress. PSYCH: Has normal mood, affect and thought process.   HEENT: EOMI, sclerae are anicteric. Lungs: Normal breathing effort, no conversational dyspnea. Extremities: No edema.  Neurologic: No gross sensory or motor deficits. No tremors or fasciculations noted.    Diagnostic Data Reviewed:  BMET    Component Value Date/Time   NA 144 09/11/2023 1021   K 4.3 09/11/2023 1021   CL 107 (H) 09/11/2023 1021   CO2 19 (L) 09/11/2023 1021   GLUCOSE 84 09/11/2023 1021   GLUCOSE 78 09/04/2023 0851   BUN 12 09/11/2023 1021   CREATININE 1.13 (H) 09/11/2023 1021   CALCIUM  9.1 09/11/2023 1021   GFRNONAA >60 08/15/2023 1432  GFRAA >60 12/10/2019 1600   Lab Results  Component Value Date   HGBA1C 5.5 07/25/2023   HGBA1C 5.8 (H) 07/31/2021   Lab Results  Component Value Date   INSULIN  8.7 03/14/2023   Lab Results  Component Value Date   TSH 2.260 08/14/2023   CBC    Component Value Date/Time   WBC 5.9 09/11/2023 1021   WBC 6.7 09/04/2023 0851   RBC 4.86 09/11/2023 1021   RBC 4.76 09/04/2023 0851   HGB 12.9 09/11/2023  1021   HCT 40.5 09/11/2023 1021   PLT 237 09/11/2023 1021   MCV 83 09/11/2023 1021   MCH 26.5 (L) 09/11/2023 1021   MCH 26.9 08/15/2023 1432   MCHC 31.9 09/11/2023 1021   MCHC 32.2 09/04/2023 0851   RDW 16.5 (H) 09/11/2023 1021   Iron Studies No results found for: IRON, TIBC, FERRITIN, IRONPCTSAT Lipid Panel     Component Value Date/Time   CHOL 132 07/25/2023 0502   CHOL 193 02/27/2021 1013   TRIG 76 07/25/2023 0502   HDL 56 07/25/2023 0502   HDL 60 02/27/2021 1013   CHOLHDL 2.4 07/25/2023 0502   VLDL 15 07/25/2023 0502   LDLCALC 61 07/25/2023 0502   LDLCALC 115 (H) 02/27/2021 1013   LDLDIRECT 111 (H) 02/27/2021 1013   Hepatic Function Panel     Component Value Date/Time   PROT 7.0 08/15/2023 1432   PROT 7.6 08/14/2023 1341   ALBUMIN  4.2 08/15/2023 1432   ALBUMIN  4.8 08/14/2023 1341   AST 26 08/15/2023 1432   ALT 9 08/15/2023 1432   ALKPHOS 55 08/15/2023 1432   BILITOT 0.7 08/15/2023 1432   BILITOT 0.3 08/14/2023 1341   BILIDIR <0.1 07/23/2023 0528   IBILI NOT CALCULATED 07/23/2023 0528      Component Value Date/Time   TSH 2.260 08/14/2023 1341   Nutritional Lab Results  Component Value Date   VD25OH 54.08 07/26/2023   VD25OH 59.4 03/14/2023   VD25OH 30.38 03/04/2020    Medications: Outpatient Encounter Medications as of 10/01/2023  Medication Sig Note   acetaminophen  (TYLENOL ) 325 MG tablet Take 2 tablets (650 mg total) by mouth every 6 (six) hours as needed for mild pain or moderate pain.    amiodarone  (PACERONE ) 200 MG tablet Take 1 tablet (200 mg total) by mouth daily.    aspirin -acetaminophen -caffeine  (EXCEDRIN  MIGRAINE) 250-250-65 MG tablet Take 2 tablets by mouth 2 (two) times daily as needed for migraine. 07/23/2023: Has been taking for about a week d/t headache.   botulinum toxin Type A  (BOTOX ) 200 units injection Inject 200 Units into the muscle every 3 (three) months.    busPIRone  (BUSPAR ) 30 MG tablet Take 1 tablet (30 mg total) by mouth 2  (two) times daily.    calcium  carbonate (TUMS) 500 MG chewable tablet Chew 1 tablet (200 mg of elemental calcium  total) by mouth daily.    cetirizine (ZYRTEC) 10 MG tablet Take 10 mg by mouth daily as needed for allergies.    Cholecalciferol  75 MCG (3000 UT) TABS Take 3,000 Units by mouth daily.    clonazePAM  (KLONOPIN ) 0.5 MG tablet Take 1 tablet (0.5 mg total) by mouth 2 (two) times daily.    diltiazem  (CARDIZEM  CD) 120 MG 24 hr capsule Take 1 capsule (120 mg total) by mouth daily.    diltiazem  (CARDIZEM ) 30 MG tablet TAKE 1 TABLET EVERY 4 HOURS AS NEEDED FOR AFIB HEART RATE >95 AS LONG AS TOP BP >100.    diphenhydrAMINE  (BENADRYL  ALLERGY EXTRA STR)  50 MG tablet Take 1 tablet (50 mg total) by mouth once for 1 dose. Take 1 tablet 1 hour prior to CT scan.    enoxaparin  (LOVENOX ) 120 MG/0.8ML injection Inject 0.8 mLs (120 mg total) into the skin daily for 3 days.    escitalopram  (LEXAPRO ) 10 MG tablet TAKE 1 TABLET AT BEDTIME    fexofenadine (ALLEGRA) 180 MG tablet Take 180 mg by mouth as needed for allergies or rhinitis.    fluticasone  (FLONASE ) 50 MCG/ACT nasal spray Place 1 spray into both nostrils daily as needed for allergies.     gabapentin  (NEURONTIN ) 300 MG capsule Take 1 capsule (300 mg total) by mouth 3 (three) times daily.    lamoTRIgine  (LAMICTAL ) 200 MG tablet TAKE 1 TABLET TWICE DAILY    LORazepam  (ATIVAN ) 0.5 MG tablet Take 1 tablet (0.5 mg total) by mouth 3 (three) times daily.    lumateperone  tosylate (CAPLYTA ) 42 MG capsule Take 1 capsule (42 mg total) by mouth daily.    metFORMIN  (GLUCOPHAGE -XR) 500 MG 24 hr tablet Take 1 tablet (500 mg total) by mouth 2 (two) times daily with a meal.    Multiple Vitamin (MULTIVITAMIN WITH MINERALS) TABS tablet Take 1 tablet by mouth daily.    polyethylene glycol (MIRALAX  / GLYCOLAX ) 17 g packet Take 17 g by mouth 2 (two) times daily.    potassium chloride  SA (KLOR-CON  M) 20 MEQ tablet Take 1 tablet (20 mEq total) by mouth daily.    QUEtiapine   (SEROQUEL ) 50 MG tablet Take one to two tablets at bedtime.    warfarin (COUMADIN ) 5 MG tablet TAKE 1 TABLET (5 MG) BY MOUTH DAILY EXCEPT TAKE 1 1/2 TABLETS (7.5 MG) ON SUNDAY OR AS DIRECTED BY ANTICOAGULATION CLINIC    No facility-administered encounter medications on file as of 10/01/2023.     Follow-Up   No follow-ups on file.SABRA She was informed of the importance of frequent follow up visits to maximize her success with intensive lifestyle modifications for her multiple health conditions.  Attestation Statement   Reviewed by clinician on day of visit: allergies, medications, problem list, medical history, surgical history, family history, social history, and previous encounter notes.     Lucas Parker, MD

## 2023-10-02 ENCOUNTER — Ambulatory Visit (INDEPENDENT_AMBULATORY_CARE_PROVIDER_SITE_OTHER): Payer: Self-pay | Admitting: Internal Medicine

## 2023-10-02 DIAGNOSIS — I480 Paroxysmal atrial fibrillation: Secondary | ICD-10-CM | POA: Diagnosis not present

## 2023-10-02 LAB — CMP14+EGFR
ALT: 22 IU/L (ref 0–32)
AST: 29 IU/L (ref 0–40)
Albumin: 4.4 g/dL (ref 3.8–4.9)
Alkaline Phosphatase: 81 IU/L (ref 44–121)
BUN/Creatinine Ratio: 9 — ABNORMAL LOW (ref 12–28)
BUN: 10 mg/dL (ref 8–27)
Bilirubin Total: 0.3 mg/dL (ref 0.0–1.2)
CO2: 21 mmol/L (ref 20–29)
Calcium: 9.2 mg/dL (ref 8.7–10.3)
Chloride: 104 mmol/L (ref 96–106)
Creatinine, Ser: 1.08 mg/dL — ABNORMAL HIGH (ref 0.57–1.00)
Globulin, Total: 2.8 g/dL (ref 1.5–4.5)
Glucose: 80 mg/dL (ref 70–99)
Potassium: 4.2 mmol/L (ref 3.5–5.2)
Sodium: 143 mmol/L (ref 134–144)
Total Protein: 7.2 g/dL (ref 6.0–8.5)
eGFR: 59 mL/min/1.73 — ABNORMAL LOW (ref >59)

## 2023-10-02 LAB — MAGNESIUM: Magnesium: 2.3 mg/dL (ref 1.6–2.3)

## 2023-10-04 ENCOUNTER — Other Ambulatory Visit (HOSPITAL_COMMUNITY): Payer: Self-pay

## 2023-10-08 ENCOUNTER — Telehealth: Payer: Self-pay

## 2023-10-08 ENCOUNTER — Ambulatory Visit (INDEPENDENT_AMBULATORY_CARE_PROVIDER_SITE_OTHER)

## 2023-10-08 DIAGNOSIS — Z7901 Long term (current) use of anticoagulants: Secondary | ICD-10-CM

## 2023-10-08 LAB — POCT INR: INR: 2.4 (ref 2.0–3.0)

## 2023-10-08 NOTE — Telephone Encounter (Signed)
 Pt in coumadin  clinic today for INR. Pt has upcoming ablation scheduled for 9/24 and per cardiology request, will need weekly INR checks for 3 weeks before the procedure. Those dates are 9/2, 9/9, and 9/16. This nurse will not be in the office on 9/2, so pt has been scheduled for a lab INR draw. Advised pt this nurse will contact her later that afternoon with dosing instructions. Advised if any changes to contact coumadin  clinic. Pt verbalized understanding.

## 2023-10-08 NOTE — Progress Notes (Signed)
 Ablation scheduled for 9/24. Pt will need 4 weekly INR results before procedure. Dates are 9/2, 9/9, 9/16 in coumadin  clinic and last will be performed before procedure on 9/24. Coumadin  nurse will be out of the office on 9/2. Pt will have a lab INR drawn and this nurse will contact pt that afternoon with dosing instructions. Sent provider a msg concerning lab INR. Added lab INR order Continue 1 tablet daily except take 1 1/2 tablets on Sunday. Recheck in 5 weeks.

## 2023-10-08 NOTE — Patient Instructions (Addendum)
 Pre visit review using our clinic review tool, if applicable. No additional management support is needed unless otherwise documented below in the visit note.  Continue 1 tablet daily except take 1 1/2 tablets on Sunday. Recheck in 5 weeks.

## 2023-10-09 ENCOUNTER — Ambulatory Visit: Admitting: Neurology

## 2023-10-10 ENCOUNTER — Encounter: Payer: Self-pay | Admitting: Adult Health

## 2023-10-10 ENCOUNTER — Telehealth: Admitting: Adult Health

## 2023-10-10 ENCOUNTER — Other Ambulatory Visit: Payer: Self-pay | Admitting: Adult Health

## 2023-10-10 ENCOUNTER — Ambulatory Visit: Admitting: Neurology

## 2023-10-10 DIAGNOSIS — F411 Generalized anxiety disorder: Secondary | ICD-10-CM | POA: Diagnosis not present

## 2023-10-10 DIAGNOSIS — F310 Bipolar disorder, current episode hypomanic: Secondary | ICD-10-CM | POA: Diagnosis not present

## 2023-10-10 DIAGNOSIS — G4700 Insomnia, unspecified: Secondary | ICD-10-CM

## 2023-10-10 DIAGNOSIS — F09 Unspecified mental disorder due to known physiological condition: Secondary | ICD-10-CM

## 2023-10-10 MED ORDER — CLONAZEPAM 0.5 MG PO TABS: 0.5000 mg | ORAL_TABLET | Freq: Two times a day (BID) | ORAL | 2 refills | Status: DC

## 2023-10-10 MED ORDER — LAMOTRIGINE 200 MG PO TABS
200.0000 mg | ORAL_TABLET | Freq: Two times a day (BID) | ORAL | 0 refills | Status: DC
Start: 2023-10-10 — End: 2023-10-25

## 2023-10-10 NOTE — Progress Notes (Signed)
 [name] [id number] September 22, 1960 63 y.o.  Virtual Visit via Video Note  I connected with pt @ on 10/10/23 at  8:30 AM EDT by a video enabled telemedicine application and verified that I am speaking with the correct person using two identifiers.   I discussed the limitations of evaluation and management by telemedicine and the availability of in person appointments. The patient expressed understanding and agreed to proceed.  I discussed the assessment and treatment plan with the patient. The patient was provided an opportunity to ask questions and all were answered. The patient agreed with the plan and demonstrated an understanding of the instructions.   The patient was advised to call back or seek an in-person evaluation if the symptoms worsen or if the condition fails to improve as anticipated.  I provided 30 minutes of non-face-to-face time during this encounter.  The patient was located at home.  The provider was located at [facility].   Angeline LOISE Sayers, NP   Subjective:   Patient ID:  Emily Rice is a 63 y.o. (DOB [birth year]) female.  Chief Complaint: No chief complaint on file.   HPI BROOKELYNNE DIMPERIO presents for follow-up of  Bipolar effective disorder, cognitive disorder, GAD, and insomnia.    Describes mood today as ok. Denies tearfulness. Mood symptoms - reports depression, anxiety and irritability. Reports varying interest and motivation. Reports panic attacks - 3 to 5 a week. Reports worry, rumination and over thinking. Reports obsessive thoughts and acts. Reports mood is variable. Stating I feel like I'm doing alright. Feels like medications are helpful. Taking medications as prescribed. Working with therapist.  Energy levels stable. Active, has a regular exercise routine. Enjoys some usual interests and activities. Married. Lives with husband. Has 2 grown children. Spending time with family. Appetite adequate. Weight loss - working with [facility]  weight loss management. Sleeps better some nights than others. Averages 6 to 7 hours with Seroquel  100mg . Denies daytime napping. Reports ongoing difficulties with focus and concentration. Completing tasks. Managing aspects of household. 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 Denies SI or HI.  Denies AH or VH. Denies self harm. Denies substance use.    Review of Systems:  Review of Systems  Musculoskeletal:  Negative for gait problem.  Neurological:  Negative for tremors.  Psychiatric/Behavioral:         Please refer to HPI    Medications: I have reviewed the patient's current medications.  Current Outpatient Medications  Medication Sig Dispense Refill   acetaminophen  (TYLENOL ) 325 MG tablet Take 2 tablets (650 mg total) by mouth every 6 (six) hours as needed for mild pain or moderate pain.     amiodarone  (PACERONE ) 200 MG tablet Take 1 tablet (200 mg total) by mouth daily. 90 tablet 3   aspirin -acetaminophen -caffeine  (EXCEDRIN  MIGRAINE) 250-250-65 MG tablet Take 2 tablets by mouth 2 (two) times daily as needed for migraine.     botulinum toxin Type A  (BOTOX ) 200 units injection Inject 200 Units into the muscle every 3 (three) months. 1 each 3   busPIRone  (BUSPAR ) 30 MG tablet Take 1 tablet (30 mg total) by mouth 2 (two) times daily. 180 tablet 0   calcium  carbonate (TUMS) 500 MG chewable tablet Chew 1 tablet (200 mg of elemental calcium  total) by mouth daily. 90 tablet 3   cetirizine (ZYRTEC) 10 MG tablet Take 10 mg by mouth daily as needed for allergies.     Cholecalciferol  75 MCG (3000 UT) TABS Take 3,000  Units by mouth daily. 30 tablet 0   clonazePAM  (KLONOPIN ) 0.5 MG tablet Take 1 tablet (0.5 mg total) by mouth 2 (two) times daily. 60 tablet 1   diltiazem  (CARDIZEM  CD) 120 MG 24 hr capsule Take 1 capsule (120 mg total) by mouth daily. 90 capsule 3   diltiazem  (CARDIZEM ) 30 MG tablet TAKE 1 TABLET EVERY 4 HOURS AS NEEDED FOR AFIB HEART RATE >95 AS LONG AS TOP BP >100.  90 tablet 1   diphenhydrAMINE  (BENADRYL  ALLERGY EXTRA STR) 50 MG tablet Take 1 tablet (50 mg total) by mouth once for 1 dose. Take 1 tablet 1 hour prior to CT scan. 1 tablet 0   enoxaparin  (LOVENOX ) 120 MG/0.8ML injection Inject 0.8 mLs (120 mg total) into the skin daily for 3 days. 2.4 mL 0   escitalopram  (LEXAPRO ) 10 MG tablet TAKE 1 TABLET AT BEDTIME 90 tablet 0   fexofenadine (ALLEGRA) 180 MG tablet Take 180 mg by mouth as needed for allergies or rhinitis.     fluticasone  (FLONASE ) 50 MCG/ACT nasal spray Place 1 spray into both nostrils daily as needed for allergies.      gabapentin  (NEURONTIN ) 300 MG capsule Take 1 capsule (300 mg total) by mouth 3 (three) times daily. 90 capsule 3   lamoTRIgine  (LAMICTAL ) 200 MG tablet TAKE 1 TABLET TWICE DAILY 180 tablet 0   LORazepam  (ATIVAN ) 0.5 MG tablet Take 1 tablet (0.5 mg total) by mouth 3 (three) times daily. 90 tablet 1   lumateperone  tosylate (CAPLYTA ) 42 MG capsule Take 1 capsule (42 mg total) by mouth daily. 90 capsule 0   metFORMIN  (GLUCOPHAGE -XR) 500 MG 24 hr tablet Take 1 tablet (500 mg total) by mouth 2 (two) times daily with a meal.     Multiple Vitamin (MULTIVITAMIN WITH MINERALS) TABS tablet Take 1 tablet by mouth daily.     polyethylene glycol (MIRALAX  / GLYCOLAX ) 17 g packet Take 17 g by mouth 2 (two) times daily. 14 each 0   potassium chloride  SA (KLOR-CON  M) 20 MEQ tablet Take 1 tablet (20 mEq total) by mouth daily. 90 tablet 3   QUEtiapine  (SEROQUEL ) 50 MG tablet Take one to two tablets at bedtime. 180 tablet 0   Semaglutide -Weight Management (WEGOVY ) 0.25 MG/0.5ML SOAJ Inject 0.25 mg into the skin once a week. 2 mL 0   warfarin (COUMADIN ) 5 MG tablet TAKE 1 TABLET (5 MG) BY MOUTH DAILY EXCEPT TAKE 1 1/2 TABLETS (7.5 MG) ON SUNDAY OR AS DIRECTED BY ANTICOAGULATION CLINIC 100 tablet 1   No current facility-administered medications for this visit.    Medication Side Effects: None  Allergies:  Allergies  Allergen Reactions    Hydromorphone  Other (See Comments)    Stroke-like symptoms. treated with Narcan; Tolerated Percocet in the past.   Iodinated Contrast Media Other (See Comments)    Reaction not fully known   Prochlorperazine Swelling and Other (See Comments)    Compazine - tongue swelling    Past Medical History:  Diagnosis Date   A-fib (HCC)    Allergy    Anxiety    Bipolar disorder (HCC)    Chondromalacia patellae 12/14/2003   Depression    DVT (deep vein thrombosis) in pregnancy    DVT (deep venous thrombosis) (HCC) 2001 and 2002   DVT (deep venous thrombosis) (HCC)    x 2   Enlarged pancreas 07/02/2023   Fulness of pancreatic head on CT    Esophagitis 06/06/2016   GERD (gastroesophageal reflux disease)    Hemorrhage  Hyperlipidemia    Migraines    Pulmonary embolism (HCC) 2001   S/P cholecystectomy 09/11/2012   Sickle cell anemia (HCC)    Suicidal ideation 12/28/2008   Tremor     Family History  Problem Relation Age of Onset   Hypertension Mother    Stroke Father    Hypertension Father    Atrial fibrillation Father    Heart disease Father    Diabetes Sister    CAD Brother    Anxiety disorder Daughter    ADD / ADHD Son    Depression Son    Hypertension Maternal Uncle    Liver disease Neg Hx    Esophageal cancer Neg Hx    Colon cancer Neg Hx     Social History   Socioeconomic History   Marital status: Married    Spouse name: Marine scientist   Number of children: 2   Years of education: college   Highest education level: Master's degree (e.g., MA, MS, MEng, MEd, MSW, MBA)  Occupational History   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   Occupation: retired  Tobacco Use   Smoking status: Never   Smokeless tobacco: Never   Tobacco comments:    Never smoked 09/20/23  Vaping Use   Vaping status: Never Used  Substance and Sexual Activity   Alcohol use: Never    Alcohol/week: 2.0 standard drinks of alcohol    Types: 2 Standard drinks or equivalent per week    Comment:  occasional   Drug use: Not Currently    Frequency: 7.0 times per week    Types: Benzodiazepines   Sexual activity: Yes    Birth control/protection: Post-menopausal  Other Topics Concern   Not on file  Social History Narrative   ** Merged History Encounter ** Lives with husband.   Right-handed.   No daily use of caffeine .   Lives with husband.-2025      Social Drivers of Health   Financial Resource Strain: Low Risk  (09/10/2023)   Overall Financial Resource Strain (CARDIA)    Difficulty of Paying Living Expenses: Not hard at all  Food Insecurity: No Food Insecurity (09/10/2023)   Hunger Vital Sign    Worried About Running Out of Food in the Last Year: Never true    Ran Out of Food in the Last Year: Never true  Transportation Needs: Unknown (09/10/2023)   PRAPARE - Administrator, Civil Service (Medical): Patient declined    Lack of Transportation (Non-Medical): No  Physical Activity: Sufficiently Active (09/10/2023)   Exercise Vital Sign    Days of Exercise per Week: 4 days    Minutes of Exercise per Session: 60 min  Stress: Stress Concern Present (09/10/2023)   [employer] of Occupational Health - Occupational Stress Questionnaire    Feeling of Stress: Very much  Social Connections: Moderately Integrated (09/10/2023)   Social Connection and Isolation Panel    Frequency of Communication with Friends and Family: More than three times a week    Frequency of Social Gatherings with Friends and Family: More than three times a week    Attends Religious Services: More than 4 times per year    Active Member of [employer] or Organizations: No    Attends Banker Meetings: Not on file    Marital Status: Married  Intimate Partner Violence: Not At Risk (07/24/2023)   Humiliation, Afraid, Rape, and Kick questionnaire    Fear of Current or Ex-Partner: No    Emotionally Abused: No  Physically Abused: No    Sexually Abused: No    Past Medical History, Surgical history,  Social history, and Family history were reviewed and updated as appropriate.   Please see review of systems for further details on the patient's review from today.   Objective:   Physical Exam:  LMP  (LMP Unknown)   Physical Exam Constitutional:      General: She is not in acute distress. Musculoskeletal:        General: No deformity.  Neurological:     Mental Status: She is alert and oriented to person, place, and time.     Coordination: Coordination normal.  Psychiatric:        Attention and Perception: Attention and perception normal. She does not perceive auditory or visual hallucinations.        Mood and Affect: Mood normal. Mood is not anxious or depressed. Affect is not labile, blunt, angry or inappropriate.        Speech: Speech normal.        Behavior: Behavior normal.        Thought Content: Thought content normal. Thought content is not paranoid or delusional. Thought content does not include homicidal or suicidal ideation. Thought content does not include homicidal or suicidal plan.        Cognition and Memory: Cognition and memory normal.        Judgment: Judgment normal.     Comments: Insight intact     Lab Review:     Component Value Date/Time   NA 143 10/01/2023 1058   K 4.2 10/01/2023 1058   CL 104 10/01/2023 1058   CO2 21 10/01/2023 1058   GLUCOSE 80 10/01/2023 1058   GLUCOSE 78 09/04/2023 0851   BUN 10 10/01/2023 1058   CREATININE 1.08 (H) 10/01/2023 1058   CALCIUM  9.2 10/01/2023 1058   PROT 7.2 10/01/2023 1058   ALBUMIN  4.4 10/01/2023 1058   AST 29 10/01/2023 1058   ALT 22 10/01/2023 1058   ALKPHOS 81 10/01/2023 1058   BILITOT 0.3 10/01/2023 1058   GFRNONAA >60 08/15/2023 1432   GFRAA >60 12/10/2019 1600       Component Value Date/Time   WBC 5.9 09/11/2023 1021   WBC 6.7 09/04/2023 0851   RBC 4.86 09/11/2023 1021   RBC 4.76 09/04/2023 0851   HGB 12.9 09/11/2023 1021   HCT 40.5 09/11/2023 1021   PLT 237 09/11/2023 1021   MCV 83 09/11/2023  1021   MCH 26.5 (L) 09/11/2023 1021   MCH 26.9 08/15/2023 1432   MCHC 31.9 09/11/2023 1021   MCHC 32.2 09/04/2023 0851   RDW 16.5 (H) 09/11/2023 1021   LYMPHSABS 2.5 08/15/2023 1432   MONOABS 0.4 08/15/2023 1432   EOSABS 0.1 08/15/2023 1432   BASOSABS 0.0 08/15/2023 1432    No results found for: POCLITH, LITHIUM   No results found for: PHENYTOIN, PHENOBARB, VALPROATE, CBMZ   .res Assessment: Plan:    Plan:  PDMP reviewed  Continue:  Seroquel  100mg  at bedtime   Clonazepam  0.5mg  BID.  Caplyta  42mg  daily - using samples due to cost Lamictal  200mg  twice daily Lexapro  10mg  daily Buspar  30mg  BID for anxiety - dose increase  RTC 6 weeks  30 minutes spent dedicated to the care of this patient on the date of this encounter to include pre-visit review of records, ordering of medication, post visit documentation, and face-to-face time with the patient discussing Bipolar effective disorder, cognitive disorder, GAD, and insomnia. Discussed continuing current medication regimen. Also  spoke with patient's husband about her current medications and treatment.   Seeing therapist - Marval Bunde  Patient advised to contact office with any questions, adverse effects, or acute worsening in signs and symptoms.   Discussed potential benefits, risk, and side effects of benzodiazepines to include potential risk of tolerance and dependence, as well as possible drowsiness. Advised patient not to drive if experiencing drowsiness and to take lowest possible effective dose to minimize risk of dependence and tolerance.   Discussed potential metabolic side effects associated with atypical antipsychotics, as well as potential risk for movement side effects. Advised pt to contact office if movement side effects occur.    There are no diagnoses linked to this encounter.   Please see After Visit Summary for patient specific instructions.  Future Appointments  Date Time Provider Department  Center  10/10/2023  8:30 AM Nasire Reali, Angeline Mattocks, NP [facility] None  10/17/2023  9:40 AM Lendia Nordmann L, NP-C [facility] None  10/25/2023  9:30 AM Fenton, Clint R, PA [facility] H&V  10/30/2023 11:00 AM Francyne Romano, MD [facility] None  11/12/2023  8:30 AM [facility] None  11/13/2023 10:00 AM [facility] CT 1 [facility] H&V  11/19/2023  9:00 AM [facility] None  11/26/2023  9:00 AM [facility] None  02/19/2024  8:40 AM Henson, Vickie L, NP-C [facility] None    No orders of the defined types were placed in this encounter.     -------------------------------

## 2023-10-11 ENCOUNTER — Other Ambulatory Visit (HOSPITAL_COMMUNITY): Payer: Self-pay

## 2023-10-14 ENCOUNTER — Other Ambulatory Visit: Payer: Self-pay | Admitting: Medical Genetics

## 2023-10-15 ENCOUNTER — Ambulatory Visit

## 2023-10-17 ENCOUNTER — Ambulatory Visit: Payer: Self-pay | Admitting: Family Medicine

## 2023-10-17 ENCOUNTER — Encounter: Payer: Self-pay | Admitting: Family Medicine

## 2023-10-17 ENCOUNTER — Ambulatory Visit: Payer: Self-pay

## 2023-10-17 ENCOUNTER — Ambulatory Visit: Admitting: Family Medicine

## 2023-10-17 VITALS — BP 130/84 | HR 58 | Temp 97.6°F | Ht 63.0 in | Wt 170.0 lb

## 2023-10-17 DIAGNOSIS — Z86018 Personal history of other benign neoplasm: Secondary | ICD-10-CM | POA: Diagnosis not present

## 2023-10-17 DIAGNOSIS — Z79899 Other long term (current) drug therapy: Secondary | ICD-10-CM | POA: Diagnosis not present

## 2023-10-17 DIAGNOSIS — I10 Essential (primary) hypertension: Secondary | ICD-10-CM

## 2023-10-17 DIAGNOSIS — I480 Paroxysmal atrial fibrillation: Secondary | ICD-10-CM | POA: Diagnosis not present

## 2023-10-17 DIAGNOSIS — N1831 Chronic kidney disease, stage 3a: Secondary | ICD-10-CM

## 2023-10-17 DIAGNOSIS — G43709 Chronic migraine without aura, not intractable, without status migrainosus: Secondary | ICD-10-CM | POA: Diagnosis not present

## 2023-10-17 DIAGNOSIS — Z7901 Long term (current) use of anticoagulants: Secondary | ICD-10-CM

## 2023-10-17 LAB — PROTIME-INR
INR: 1.8 ratio — ABNORMAL HIGH (ref 0.8–1.0)
Prothrombin Time: 19 s — ABNORMAL HIGH (ref 9.6–13.1)

## 2023-10-17 LAB — PROLACTIN: Prolactin: 22.2 ng/mL

## 2023-10-17 LAB — VITAMIN B12: Vitamin B-12: 511 pg/mL (ref 211–911)

## 2023-10-17 LAB — VITAMIN D 25 HYDROXY (VIT D DEFICIENCY, FRACTURES): VITD: 56.84 ng/mL (ref 30.00–100.00)

## 2023-10-17 NOTE — Patient Instructions (Signed)
Pre visit review using our clinic review tool, if applicable. No additional management support is needed unless otherwise documented below in the visit note. 

## 2023-10-17 NOTE — Progress Notes (Signed)
 Please see PT/INR result. Thanks.

## 2023-10-17 NOTE — Progress Notes (Signed)
 Subjective:     Patient ID: Emily Rice, female    DOB: 02-19-1961, 63 y.o.   MRN: [id number]  Chief Complaint  Patient presents with   Chest Pain    Not sure if its due to a-fib, sometimes radiating to right side of chest. Numbness in both arms going up to neck.    Headache    Gets headaches every day, starts on right temporal area and then radiates to top of head, constant.    Chest Pain  Associated symptoms include headaches. Pertinent negatives include no abdominal pain, dizziness, fever, nausea, palpitations, shortness of breath or vomiting.  Headache  Pertinent negatives include no abdominal pain, dizziness, fever, nausea or vomiting.    Discussed the use of AI scribe software for clinical note transcription with the patient, who gave verbal consent to proceed.  History of Present Illness Emily Rice is a 63 year old female with atrial fibrillation and chronic kidney disease who presents with chest pain and neurological symptoms.  Chest pain and neurological symptoms- chronic and ongoing  - Associated numbness in affected areas - Heart rate has been dropping into the thirties and low forties - Currently taking metoprolol  20 mg and Cardizem , recently reduced to 120 mg - Upcoming cardiac ablation scheduled for September 24th - Anxiety regarding the upcoming procedure  Headache and migraine symptoms - Chronic headaches localized to the right temporal area, radiating behind the eye - Brain MRI in June showed no acute intracranial abnormality - Occasionally takes Excedrin  Migraine and gabapentin  300 mg three times daily  Chronic kidney disease and electrolyte abnormalities - Stage 3A chronic kidney disease - Takes calcium  supplements and maintains hydration   Medication and supplement use - Stable liver function - Takes vitamin D3 and omega-3 supplements   Medication prescribed by psychiatrist Natalie Mozingo, NP at [facility]  for Bipolar effective disorder,  cognitive disorder , insomnia and GAD include: Seroquel  100mg  at bedtime  Clonazepam  0.5mg  BID. Caplyta  42mg  daily - using samples due to cost Lamictal  200mg  twice daily Lexapro  10mg  daily Buspar  30mg  BID for anxiety - dose increase  Seeing therapist Marval Bunde    Health Maintenance Due  Topic Date Due   Cervical Cancer Screening (HPV/Pap Cotest)  Never done   Pneumococcal Vaccine: 19-49 Years (3 of 3 - PCV) 09/03/2013   Pneumococcal Vaccine: 50+ Years (3 of 3 - PCV) 09/03/2013   INFLUENZA VACCINE  10/11/2023    Past Medical History:  Diagnosis Date   A-fib (HCC)    Allergy    Anxiety    Bipolar disorder (HCC)    Chondromalacia patellae 12/14/2003   Depression    DVT (deep vein thrombosis) in pregnancy    DVT (deep venous thrombosis) (HCC) 2001 and 2002   DVT (deep venous thrombosis) (HCC)    x 2   Enlarged pancreas 07/02/2023   Fulness of pancreatic head on CT    Esophagitis 06/06/2016   GERD (gastroesophageal reflux disease)    Hemorrhage    Hyperlipidemia    Migraines    Pulmonary embolism (HCC) 2001   S/P cholecystectomy 09/11/2012   Sickle cell anemia (HCC)    Suicidal ideation 12/28/2008   Tremor     Past Surgical History:  Procedure Laterality Date   CHOLECYSTECTOMY  2014   CHOLECYSTECTOMY     HUMERUS IM NAIL Right 03/03/2020   Procedure: INTRAMEDULLARY (IM) NAIL HUMERAL;  Surgeon: Kendal Franky SQUIBB, MD;  Location: [facility] OR;  Service: Orthopedics;  Laterality: Right;  OVARIAN CYST REMOVAL     TONSILLECTOMY      Family History  Problem Relation Age of Onset   Hypertension Mother    Stroke Father    Hypertension Father    Atrial fibrillation Father    Heart disease Father    Diabetes Sister    CAD Brother    Anxiety disorder Daughter    ADD / ADHD Son    Depression Son    Hypertension Maternal Uncle    Liver disease Neg Hx    Esophageal cancer Neg Hx    Colon cancer Neg Hx     Social History   Socioeconomic History   Marital status:  Married    Spouse name: Marine scientist   Number of children: 2   Years of education: college   Highest education level: Master's degree (e.g., MA, MS, MEng, MEd, MSW, MBA)  Occupational History   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   Occupation: retired  Tobacco Use   Smoking status: Never   Smokeless tobacco: Never   Tobacco comments:    Never smoked 09/20/23  Vaping Use   Vaping status: Never Used  Substance and Sexual Activity   Alcohol use: Never    Alcohol/week: 2.0 standard drinks of alcohol    Types: 2 Standard drinks or equivalent per week    Comment: occasional   Drug use: Not Currently    Frequency: 7.0 times per week    Types: Benzodiazepines   Sexual activity: Yes    Birth control/protection: Post-menopausal  Other Topics Concern   Not on file  Social History Narrative   ** Merged History Encounter ** Lives with husband.   Right-handed.   No daily use of caffeine .   Lives with husband.-2025      Social Drivers of Health   Financial Resource Strain: Low Risk  (09/10/2023)   Overall Financial Resource Strain (CARDIA)    Difficulty of Paying Living Expenses: Not hard at all  Food Insecurity: No Food Insecurity (09/10/2023)   Hunger Vital Sign    Worried About Running Out of Food in the Last Year: Never true    Ran Out of Food in the Last Year: Never true  Transportation Needs: Unknown (09/10/2023)   PRAPARE - Administrator, Civil Service (Medical): Patient declined    Lack of Transportation (Non-Medical): No  Physical Activity: Sufficiently Active (09/10/2023)   Exercise Vital Sign    Days of Exercise per Week: 4 days    Minutes of Exercise per Session: 60 min  Stress: Stress Concern Present (09/10/2023)   [employer] of Occupational Health - Occupational Stress Questionnaire    Feeling of Stress: Very much  Social Connections: Moderately Integrated (09/10/2023)   Social Connection and Isolation Panel    Frequency of Communication with Friends and Family:  More than three times a week    Frequency of Social Gatherings with Friends and Family: More than three times a week    Attends Religious Services: More than 4 times per year    Active Member of [employer] or Organizations: No    Attends Banker Meetings: Not on file    Marital Status: Married  Catering manager Violence: Not At Risk (07/24/2023)   Humiliation, Afraid, Rape, and Kick questionnaire    Fear of Current or Ex-Partner: No    Emotionally Abused: No    Physically Abused: No    Sexually Abused: No    Outpatient Medications Prior to Visit  Medication  Sig Dispense Refill   acetaminophen  (TYLENOL ) 325 MG tablet Take 2 tablets (650 mg total) by mouth every 6 (six) hours as needed for mild pain or moderate pain.     amiodarone  (PACERONE ) 200 MG tablet Take 1 tablet (200 mg total) by mouth daily. 90 tablet 3   aspirin -acetaminophen -caffeine  (EXCEDRIN  MIGRAINE) 250-250-65 MG tablet Take 2 tablets by mouth 2 (two) times daily as needed for migraine.     botulinum toxin Type A  (BOTOX ) 200 units injection Inject 200 Units into the muscle every 3 (three) months. 1 each 3   busPIRone  (BUSPAR ) 30 MG tablet Take 1 tablet (30 mg total) by mouth 2 (two) times daily. 180 tablet 0   calcium  carbonate (TUMS) 500 MG chewable tablet Chew 1 tablet (200 mg of elemental calcium  total) by mouth daily. 90 tablet 3   cetirizine (ZYRTEC) 10 MG tablet Take 10 mg by mouth daily as needed for allergies.     Cholecalciferol  75 MCG (3000 UT) TABS Take 3,000 Units by mouth daily. 30 tablet 0   clonazePAM  (KLONOPIN ) 0.5 MG tablet Take 1 tablet (0.5 mg total) by mouth 2 (two) times daily. 60 tablet 2   diltiazem  (CARDIZEM  CD) 120 MG 24 hr capsule Take 1 capsule (120 mg total) by mouth daily. 90 capsule 3   diltiazem  (CARDIZEM ) 30 MG tablet TAKE 1 TABLET EVERY 4 HOURS AS NEEDED FOR AFIB HEART RATE >95 AS LONG AS TOP BP >100. 90 tablet 1   diphenhydrAMINE  (BENADRYL  ALLERGY EXTRA STR) 50 MG tablet Take 1 tablet  (50 mg total) by mouth once for 1 dose. Take 1 tablet 1 hour prior to CT scan. 1 tablet 0   enoxaparin  (LOVENOX ) 120 MG/0.8ML injection Inject 0.8 mLs (120 mg total) into the skin daily for 3 days. 2.4 mL 0   escitalopram  (LEXAPRO ) 10 MG tablet TAKE 1 TABLET AT BEDTIME 90 tablet 0   fexofenadine (ALLEGRA) 180 MG tablet Take 180 mg by mouth as needed for allergies or rhinitis.     fluticasone  (FLONASE ) 50 MCG/ACT nasal spray Place 1 spray into both nostrils daily as needed for allergies.      gabapentin  (NEURONTIN ) 300 MG capsule Take 1 capsule (300 mg total) by mouth 3 (three) times daily. 90 capsule 3   lamoTRIgine  (LAMICTAL ) 200 MG tablet Take 1 tablet (200 mg total) by mouth 2 (two) times daily. 180 tablet 0   lumateperone  tosylate (CAPLYTA ) 42 MG capsule Take 1 capsule (42 mg total) by mouth daily. 90 capsule 0   metFORMIN  (GLUCOPHAGE -XR) 500 MG 24 hr tablet Take 1 tablet (500 mg total) by mouth 2 (two) times daily with a meal.     Multiple Vitamin (MULTIVITAMIN WITH MINERALS) TABS tablet Take 1 tablet by mouth daily.     polyethylene glycol (MIRALAX  / GLYCOLAX ) 17 g packet Take 17 g by mouth 2 (two) times daily. 14 each 0   potassium chloride  SA (KLOR-CON  M) 20 MEQ tablet Take 1 tablet (20 mEq total) by mouth daily. 90 tablet 3   QUEtiapine  (SEROQUEL ) 50 MG tablet Take one to two tablets at bedtime. 180 tablet 0   warfarin (COUMADIN ) 5 MG tablet TAKE 1 TABLET (5 MG) BY MOUTH DAILY EXCEPT TAKE 1 1/2 TABLETS (7.5 MG) ON SUNDAY OR AS DIRECTED BY ANTICOAGULATION CLINIC 100 tablet 1   Semaglutide -Weight Management (WEGOVY ) 0.25 MG/0.5ML SOAJ Inject 0.25 mg into the skin once a week. (Patient not taking: Reported on 10/17/2023) 2 mL 0   No facility-administered medications prior  to visit.    Allergies  Allergen Reactions   Hydromorphone  Other (See Comments)    Stroke-like symptoms. treated with Narcan; Tolerated Percocet in the past.   Iodinated Contrast Media Other (See Comments)    Reaction  not fully known   Prochlorperazine Swelling and Other (See Comments)    Compazine - tongue swelling    Review of Systems  Constitutional:  Negative for chills and fever.  Respiratory:  Negative for shortness of breath.   Cardiovascular:  Positive for chest pain. Negative for palpitations and leg swelling.  Gastrointestinal:  Negative for abdominal pain, constipation, diarrhea, nausea and vomiting.  Genitourinary:  Negative for dysuria, frequency and urgency.  Neurological:  Positive for headaches. Negative for dizziness and loss of consciousness.  Psychiatric/Behavioral:  Positive for memory loss.        Objective:    Physical Exam Constitutional:      General: She is not in acute distress.    Appearance: She is not ill-appearing.  Eyes:     Extraocular Movements: Extraocular movements intact.     Conjunctiva/sclera: Conjunctivae normal.  Cardiovascular:     Rate and Rhythm: Normal rate and regular rhythm.  Pulmonary:     Effort: Pulmonary effort is normal.  Musculoskeletal:        General: Normal range of motion.     Cervical back: Normal range of motion and neck supple.  Skin:    General: Skin is warm and dry.  Neurological:     General: No focal deficit present.     Mental Status: She is alert and oriented to person, place, and time.  Psychiatric:        Mood and Affect: Mood normal.        Behavior: Behavior normal.        Thought Content: Thought content normal.      BP 130/84 (BP Location: Left Arm, Patient Position: Sitting)   Pulse (!) 58   Temp 97.6 F (36.4 C) (Temporal)   Ht 5' 3 (1.6 m)   Wt 170 lb (77.1 kg)   LMP  (LMP Unknown)   SpO2 98%   BMI 30.11 kg/m  Wt Readings from Last 3 Encounters:  10/17/23 170 lb (77.1 kg)  10/01/23 167 lb (75.8 kg)  09/20/23 168 lb 3.2 oz (76.3 kg)       Assessment & Plan:   Problem List Items Addressed This Visit     Benign essential hypertension - Primary   Chronic kidney disease, stage 3a (HCC)    Chronic migraine w/o aura w/o status migrainosus, not intractable   History of pituitary adenoma   Relevant Orders   Prolactin   Long term current use of anticoagulant therapy   Relevant Orders   Protime-INR (Completed)   Paroxysmal atrial fibrillation (HCC)   Relevant Orders   Protime-INR (Completed)   Other Visit Diagnoses       Medication management       Relevant Orders   VITAMIN D  25 Hydroxy (Vit-D Deficiency, Fractures) (Completed)   Vitamin B12 (Completed)      Assessment and Plan Assessment & Plan Stage 3a chronic kidney disease Stage 3a chronic kidney disease. Recent labs show GFR around 60, with no significant decline. No history of acute kidney injury. - Monitor renal function regularly - Encourage hydration - Avoid nephrotoxic medications - Consider nephrology referral if significant worsening occurs  Paroxysmal atrial fibrillation Paroxysmal atrial fibrillation with scheduled ablation on September 24th. She expresses anxiety about the procedure.  -  Proceed with scheduled ablation on September 24th per cardiology    Chronic migraine without aura Chronic migraine without aura with recent severe headaches and radiating pain, differing from typical migraines. Recent brain MRI showed no acute intracranial abnormalities, ruling out stroke. - Continue current migraine management - Monitor for new or worsening symptoms - Check B12 levels due to neurological symptoms  Benign essential hypertension Benign essential hypertension managed with Diltiazem . Recent dosage adjustment from 360 mg to 120 mg by hospitalist cardiologist. Blood pressure control appears stable. - Continue current antihypertensive regimen  Requests prolactin level. Advised prolactin normal in April but she would still like to have it checked.   I have discontinued Shalaya L. Katen's Wegovy . I am also having her maintain her fluticasone , fexofenadine, acetaminophen , Cholecalciferol , multivitamin with  minerals, polyethylene glycol, cetirizine, diphenhydrAMINE , aspirin -acetaminophen -caffeine , enoxaparin , calcium  carbonate, warfarin, escitalopram , metFORMIN , botulinum toxin Type A , diltiazem , potassium chloride  SA, QUEtiapine , busPIRone , lumateperone  tosylate, gabapentin , amiodarone , diltiazem , lamoTRIgine , and clonazePAM .  No orders of the defined types were placed in this encounter.

## 2023-10-17 NOTE — Patient Instructions (Signed)
 Please go downstairs for labs. Continue close follow up with your specialists.

## 2023-10-17 NOTE — Progress Notes (Signed)
 Ablation scheduled for 9/24. Pt will need 4 weekly INR results before procedure. Dates are 9/2, 9/9, 9/16 in coumadin  clinic and last will be performed before procedure on 9/24. Coumadin  nurse will be out of the office on 9/2. Pt will have a lab INR drawn and this nurse will contact pt that afternoon with dosing instructions. Sent provider a msg concerning lab INR. Pt had OV with PCP today to assess chest pain and chronic HA. Lab INR was drawn and result forwarded to coumadin  clinic. Wegovy  was d/c. No other changes.  Increase dose to take 1 1/2 tablets and then continue 1 tablet daily except take 1 1/2 tablets on Sunday. Recheck in 2 weeks.  Contacted pt by phone and advised of dosing and recheck date. Scheduled pt for next coumadin  clinic apt. Pt verbalized understanding.

## 2023-10-22 ENCOUNTER — Ambulatory Visit

## 2023-10-24 ENCOUNTER — Other Ambulatory Visit: Payer: Self-pay | Admitting: Adult Health

## 2023-10-24 DIAGNOSIS — F310 Bipolar disorder, current episode hypomanic: Secondary | ICD-10-CM

## 2023-10-25 ENCOUNTER — Other Ambulatory Visit (HOSPITAL_COMMUNITY): Payer: Self-pay | Admitting: *Deleted

## 2023-10-25 ENCOUNTER — Ambulatory Visit (HOSPITAL_COMMUNITY)
Admission: RE | Admit: 2023-10-25 | Discharge: 2023-10-25 | Disposition: A | Discharge status: Home / Self Care | Source: Ambulatory Visit | Attending: Physician Assistant | Admitting: Physician Assistant

## 2023-10-25 VITALS — BP 126/80 | HR 50 | Ht 63.0 in | Wt 171.2 lb

## 2023-10-25 DIAGNOSIS — Z79899 Other long term (current) drug therapy: Secondary | ICD-10-CM | POA: Diagnosis not present

## 2023-10-25 DIAGNOSIS — I480 Paroxysmal atrial fibrillation: Secondary | ICD-10-CM | POA: Diagnosis not present

## 2023-10-25 DIAGNOSIS — Z5181 Encounter for therapeutic drug level monitoring: Secondary | ICD-10-CM

## 2023-10-25 MED ORDER — AMIODARONE HCL 200 MG PO TABS: ORAL_TABLET | ORAL | 3 refills | Status: DC

## 2023-10-25 NOTE — Patient Instructions (Addendum)
 Stop Diltiazem    Start Amiodarone  200mg  twice a day until ablation then reduce to once a day

## 2023-10-25 NOTE — Progress Notes (Signed)
 Primary Care Physician: Lendia Boby CROME, NP-C Primary Cardiologist: Madonna Large, DO Electrophysiologist: OLE ONEIDA HOLTS, MD  Referring Physician: Dr HOLTS Wilbert Rice Emily is a 63 y.o. female with a history of HTN sickle cell trait, bipolar disorder, traumatic brain injury/SAH, atrial fibrillation who presents for follow up in the [facility].  The patient is currently on amiodarone  for rhythm control and is scheduled for an ablation on 12/04/23. Patient is on warfarin for stroke prevention. Her diltiazem  was decreased 08/2023 due to bradycardia. She wore a 2 week cardiac monitor which showed 12% afib burden.    Patient returns for follow up for atrial fibrillation and amiodarone  monitoring. She continues to have episodes of tachypalpitations and SOB. No bleeding issues on anticoagulation.   Today, she  denies symptoms of chest pain, orthopnea, PND, lower extremity edema, dizziness, presyncope, syncope, snoring, daytime somnolence, bleeding, or neurologic sequela. The patient is tolerating medications without difficulties and is otherwise without complaint today.    Atrial Fibrillation Risk Factors:  she does not have symptoms or diagnosis of sleep apnea. she does not have a history of rheumatic fever.   Atrial Fibrillation Management history:  Previous antiarrhythmic drugs: amiodarone   Previous cardioversions: none Previous ablations: none Anticoagulation history: warfarin   ROS- All systems are reviewed and negative except as per the HPI above.  Past Medical History:  Diagnosis Date   A-fib [facility])    Allergy    Anxiety    Bipolar disorder (HCC)    Chondromalacia patellae 12/14/2003   Depression    DVT (deep vein thrombosis) in pregnancy    DVT (deep venous thrombosis) (HCC) 2001 and 2002   DVT (deep venous thrombosis) (HCC)    x 2   Enlarged pancreas 07/02/2023   Fulness of pancreatic head on CT    Esophagitis 06/06/2016   GERD  (gastroesophageal reflux disease)    Hemorrhage    Hyperlipidemia    Migraines    Pulmonary embolism (HCC) 2001   S/P cholecystectomy 09/11/2012   Sickle cell anemia (HCC)    Suicidal ideation 12/28/2008   Tremor     Current Outpatient Medications  Medication Sig Dispense Refill   acetaminophen  (TYLENOL ) 325 MG tablet Take 2 tablets (650 mg total) by mouth every 6 (six) hours as needed for mild pain or moderate pain. (Patient taking differently: Take 1,300 mg by mouth as needed for mild pain (pain score 1-3) or moderate pain (pain score 4-6).)     aspirin -acetaminophen -caffeine  (EXCEDRIN  MIGRAINE) 250-250-65 MG tablet Take 2 tablets by mouth 2 (two) times daily as needed for migraine.     busPIRone  (BUSPAR ) 30 MG tablet Take 1 tablet (30 mg total) by mouth 2 (two) times daily. 180 tablet 0   calcium  carbonate (TUMS) 500 MG chewable tablet Chew 1 tablet (200 mg of elemental calcium  total) by mouth daily. 90 tablet 3   cetirizine (ZYRTEC) 10 MG tablet Take 10 mg by mouth daily as needed for allergies.     Cholecalciferol  75 MCG (3000 UT) TABS Take 3,000 Units by mouth daily. 30 tablet 0   clonazePAM  (KLONOPIN ) 0.5 MG tablet Take 1 tablet (0.5 mg total) by mouth 2 (two) times daily. 60 tablet 2   diltiazem  (CARDIZEM ) 30 MG tablet TAKE 1 TABLET EVERY 4 HOURS AS NEEDED FOR AFIB HEART RATE >95 AS LONG AS TOP BP >100. 90 tablet 1   diphenhydrAMINE  (BENADRYL  ALLERGY EXTRA STR) 50 MG tablet Take 1 tablet (50 mg total)  by mouth once for 1 dose. Take 1 tablet 1 hour prior to CT scan. (Patient taking differently: Take 50 mg by mouth as needed. Take 1 tablet 1 hour prior to CT scan.) 1 tablet 0   escitalopram  (LEXAPRO ) 10 MG tablet TAKE 1 TABLET AT BEDTIME 90 tablet 0   fexofenadine (ALLEGRA) 180 MG tablet Take 180 mg by mouth as needed for allergies or rhinitis.     fluticasone  (FLONASE ) 50 MCG/ACT nasal spray Place 1 spray into both nostrils daily as needed for allergies.      gabapentin  (NEURONTIN )  300 MG capsule Take 1 capsule (300 mg total) by mouth 3 (three) times daily. 90 capsule 3   lamoTRIgine  (LAMICTAL ) 200 MG tablet TAKE 1 TABLET TWICE DAILY 180 tablet 0   lumateperone  tosylate (CAPLYTA ) 42 MG capsule Take 1 capsule (42 mg total) by mouth daily. 90 capsule 0   metFORMIN  (GLUCOPHAGE -XR) 500 MG 24 hr tablet Take 1 tablet (500 mg total) by mouth 2 (two) times daily with a meal.     Multiple Vitamin (MULTIVITAMIN WITH MINERALS) TABS tablet Take 1 tablet by mouth daily.     polyethylene glycol (MIRALAX  / GLYCOLAX ) 17 g packet Take 17 g by mouth 2 (two) times daily. (Patient taking differently: Take 17 g by mouth as needed.) 14 each 0   potassium chloride  SA (KLOR-CON  M) 20 MEQ tablet Take 1 tablet (20 mEq total) by mouth daily. 90 tablet 3   QUEtiapine  (SEROQUEL ) 50 MG tablet Take one to two tablets at bedtime. 180 tablet 0   warfarin (COUMADIN ) 5 MG tablet TAKE 1 TABLET (5 MG) BY MOUTH DAILY EXCEPT TAKE 1 1/2 TABLETS (7.5 MG) ON SUNDAY OR AS DIRECTED BY ANTICOAGULATION CLINIC 100 tablet 1   amiodarone  (PACERONE ) 200 MG tablet Take 1 tablet (200 mg total) by mouth 2 (two) times daily for 45 days, THEN 1 tablet (200 mg total) daily. 120 tablet 3   No current facility-administered medications for this encounter.    Physical Exam: BP 126/80   Pulse (!) 50   Ht 5' 3 (1.6 m)   Wt 77.7 kg   LMP  (LMP Unknown)   BMI 30.33 kg/m   GEN: Well nourished, well developed in no acute distress CARDIAC: Regular rate and rhythm, no murmurs, rubs, gallops RESPIRATORY:  Clear to auscultation without rales, wheezing or rhonchi  ABDOMEN: Soft, non-tender, non-distended EXTREMITIES:  No edema; No deformity    Wt Readings from Last 3 Encounters:  10/25/23 77.7 kg  10/17/23 77.1 kg  10/01/23 75.8 kg     EKG today demonstrates  SB Vent. rate 50 BPM PR interval 146 ms QRS duration 80 ms QT/QTcB 458/417 ms   Echo 07/23/23 demonstrated   1. Left ventricular ejection fraction, by estimation,  is 55 to 60%. The  left ventricle has normal function. The left ventricle has no regional  wall motion abnormalities. Left ventricular diastolic parameters were  normal.   2. Right ventricular systolic function is normal. The right ventricular  size is normal. There is normal pulmonary artery systolic pressure.   3. The mitral valve is normal in structure. No evidence of mitral valve  regurgitation. No evidence of mitral stenosis.   4. The aortic valve is tricuspid. Aortic valve regurgitation is not  visualized. No aortic stenosis is present.   5. The inferior vena cava is normal in size with greater than 50%  respiratory variability, suggesting right atrial pressure of 3 mmHg.   6. PACs and PVCs  noted in study.    CHA2DS2-VASc Score = 2  The patient's score is based upon: CHF History: 0 HTN History: 1 Diabetes History: 0 Stroke History: 0 Vascular Disease History: 0 Age Score: 0 Gender Score: 1       ASSESSMENT AND PLAN: Paroxysmal Atrial Fibrillation (ICD10:  I48.0) The patient's CHA2DS2-VASc score is 2, indicating a 2.2% annual risk of stroke.   Monitor showed 12% afib burden Scheduled for afib ablation 12/04/23 Will stop diltiazem  given her intermittent bradycardia and increase amiodarone  to 200 mg BID until ablation to try and optimize her rhythm control.  Continue diltiazem  30 mg PRN q 4 hours for heart racing.  Continue warfarin. Patient will reach out to [facility] to inform them of amiodarone  change.   High Risk Medication Monitoring (ICD 10: J342684) Patient requires ongoing monitoring for anti-arrhythmic medication which has the potential to cause life threatening arrhythmias. Intervals on ECG acceptable for amiodarone  monitoring.   HTN Stable on current regimen    Follow up for afib ablation as scheduled.       [facility] [address] [place], [place] [address] [phone]

## 2023-10-28 ENCOUNTER — Encounter: Payer: Self-pay | Admitting: Neurology

## 2023-10-30 ENCOUNTER — Encounter (INDEPENDENT_AMBULATORY_CARE_PROVIDER_SITE_OTHER): Payer: Self-pay

## 2023-10-30 ENCOUNTER — Other Ambulatory Visit (HOSPITAL_COMMUNITY)
Admission: RE | Admit: 2023-10-30 | Discharge: 2023-10-30 | Disposition: A | Discharge status: Home / Self Care | Payer: Self-pay | Source: Ambulatory Visit | Attending: Oncology | Admitting: Oncology

## 2023-10-30 ENCOUNTER — Ambulatory Visit (INDEPENDENT_AMBULATORY_CARE_PROVIDER_SITE_OTHER): Admitting: Internal Medicine

## 2023-10-31 ENCOUNTER — Telehealth (INDEPENDENT_AMBULATORY_CARE_PROVIDER_SITE_OTHER): Admitting: Neurology

## 2023-10-31 DIAGNOSIS — Z79899 Other long term (current) drug therapy: Secondary | ICD-10-CM | POA: Diagnosis not present

## 2023-10-31 DIAGNOSIS — R413 Other amnesia: Secondary | ICD-10-CM

## 2023-10-31 DIAGNOSIS — G43709 Chronic migraine without aura, not intractable, without status migrainosus: Secondary | ICD-10-CM

## 2023-10-31 DIAGNOSIS — F3160 Bipolar disorder, current episode mixed, unspecified: Secondary | ICD-10-CM | POA: Diagnosis not present

## 2023-10-31 MED ORDER — ONDANSETRON 4 MG PO TBDP: 4.0000 mg | ORAL_TABLET | Freq: Three times a day (TID) | ORAL | 6 refills | Status: AC | PRN

## 2023-10-31 MED ORDER — TIZANIDINE HCL 4 MG PO TABS: 4.0000 mg | ORAL_TABLET | Freq: Four times a day (QID) | ORAL | 6 refills | Status: DC | PRN

## 2023-10-31 MED ORDER — NURTEC 75 MG PO TBDP: ORAL_TABLET | ORAL | 11 refills | Status: DC

## 2023-10-31 MED ORDER — RIZATRIPTAN BENZOATE 10 MG PO TBDP: 10.0000 mg | ORAL_TABLET | ORAL | 6 refills | Status: DC | PRN
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
## 2023-11-01 ENCOUNTER — Ambulatory Visit (INDEPENDENT_AMBULATORY_CARE_PROVIDER_SITE_OTHER)

## 2023-11-01 DIAGNOSIS — Z7901 Long term (current) use of anticoagulants: Secondary | ICD-10-CM

## 2023-11-01 LAB — POCT INR: INR: 2.5 (ref 2.0–3.0)

## 2023-11-01 NOTE — Progress Notes (Addendum)
 Scheduled for ablation on 9/24. Will need 3 weekly INRs before procedure. Scheduled for 9/2 (lab), 9/9, 9/16. Cardiology increased amiodarone  to 200 mg BID on 8/15 until ablation. This has a major interaction with warfarin and will increase INR. Pt has been on 100 mg. Continue 1 tablet daily except take 1 1/2 tablets on Sunday. Recheck in 2 weeks at lab appointment. I will call you with dosing instructions when the result is received.

## 2023-11-01 NOTE — Patient Instructions (Addendum)
 Pre visit review using our clinic review tool, if applicable. No additional management support is needed unless otherwise documented below in the visit note.  Continue 1 tablet daily except take 1 1/2 tablets on Sunday. Recheck in 2 weeks at lab appointment. I will call you with dosing instructions when the result is received.

## 2023-11-03 ENCOUNTER — Other Ambulatory Visit: Payer: Self-pay | Admitting: Adult Health

## 2023-11-03 DIAGNOSIS — F310 Bipolar disorder, current episode hypomanic: Secondary | ICD-10-CM

## 2023-11-05 ENCOUNTER — Other Ambulatory Visit: Payer: Self-pay

## 2023-11-05 DIAGNOSIS — Z7901 Long term (current) use of anticoagulants: Secondary | ICD-10-CM

## 2023-11-05 NOTE — Progress Notes (Signed)
 Adding lab INR for test next week.

## 2023-11-06 ENCOUNTER — Other Ambulatory Visit (HOSPITAL_COMMUNITY): Payer: Self-pay | Admitting: *Deleted

## 2023-11-06 MED ORDER — AMIODARONE HCL 200 MG PO TABS: ORAL_TABLET | ORAL | 3 refills | Status: DC

## 2023-11-08 ENCOUNTER — Telehealth: Payer: Self-pay

## 2023-11-08 ENCOUNTER — Telehealth (HOSPITAL_COMMUNITY): Payer: Self-pay | Admitting: *Deleted

## 2023-11-08 ENCOUNTER — Other Ambulatory Visit: Payer: Self-pay

## 2023-11-08 DIAGNOSIS — I4819 Other persistent atrial fibrillation: Secondary | ICD-10-CM

## 2023-11-08 LAB — GENECONNECT MOLECULAR SCREEN: Genetic Analysis Overall Interpretation: NEGATIVE

## 2023-11-08 MED ORDER — PREDNISONE 50 MG PO TABS
ORAL_TABLET | ORAL | 0 refills | Status: DC
Start: 2023-11-08 — End: 2023-12-03

## 2023-11-08 NOTE — Telephone Encounter (Signed)
 Returning patient's call about her upcoming cardiac imaging study; pt verbalizes understanding of appt date/time, parking situation and where to check in, pre-test NPO status and medications ordered, and verified current allergies; name and call back number provided for further questions should they arise  Chantal Requena RN Navigator Cardiac Imaging [facility] [phone] office [phone] cell  Reviewed 13 hour prep with patient. She verbalized understanding.

## 2023-11-08 NOTE — Telephone Encounter (Signed)
 Pt was to go to [facility] lab on 9/2 for lab INR due to coumadin  clinic nurse out of the office.  Pt requested to have lab performed when she was having cardiology labs completed on 9/3. Advised this is ok and that this nurse will f/u with her that afternoon with dosing instructions. Advised to assure the lab does draw the INR. Pt verbalized understanding.

## 2023-11-12 ENCOUNTER — Other Ambulatory Visit (HOSPITAL_COMMUNITY): Payer: Self-pay

## 2023-11-12 ENCOUNTER — Telehealth: Payer: Self-pay | Admitting: Neurology

## 2023-11-12 ENCOUNTER — Telehealth: Payer: Self-pay

## 2023-11-12 ENCOUNTER — Other Ambulatory Visit

## 2023-11-12 NOTE — Telephone Encounter (Signed)
 Pt called to informed MD that [facility] called PA and stating that  PT need PA for Pt MEDICATION    Rimegepant Sulfate (NURTEC) 75 MG TBDP

## 2023-11-12 NOTE — Telephone Encounter (Signed)
   I will need to outreach pharmacy plan to clarify.

## 2023-11-13 ENCOUNTER — Other Ambulatory Visit: Payer: Self-pay

## 2023-11-13 ENCOUNTER — Ambulatory Visit (HOSPITAL_COMMUNITY)
Admission: RE | Admit: 2023-11-13 | Discharge: 2023-11-13 | Disposition: A | Discharge status: Home / Self Care | Source: Ambulatory Visit | Attending: Internal Medicine | Admitting: Internal Medicine

## 2023-11-13 DIAGNOSIS — I480 Paroxysmal atrial fibrillation: Secondary | ICD-10-CM | POA: Insufficient documentation

## 2023-11-13 DIAGNOSIS — Z7901 Long term (current) use of anticoagulants: Secondary | ICD-10-CM

## 2023-11-13 DIAGNOSIS — Z86718 Personal history of other venous thrombosis and embolism: Secondary | ICD-10-CM | POA: Diagnosis not present

## 2023-11-13 DIAGNOSIS — I4819 Other persistent atrial fibrillation: Secondary | ICD-10-CM | POA: Diagnosis not present

## 2023-11-13 MED ORDER — IOHEXOL 350 MG/ML SOLN
80.0000 mL | Freq: Once | INTRAVENOUS | Status: AC | PRN
  Administered 2023-11-13: 80 mL via INTRAVENOUS

## 2023-11-13 MED ORDER — IOHEXOL 350 MG/ML SOLN: 100.0000 mL | Freq: Once | INTRAVENOUS | Status: DC | PRN

## 2023-11-13 NOTE — Progress Notes (Signed)
 Was contacted by lab, requested labs be released for pt (sitting in chair). Pt has an upcoming procedure. She needs an INR drawn. Spoke with Sherran Louder (supervisor). She gave to okay to reorder the INR under pt's PCP. Order placed and released.

## 2023-11-14 ENCOUNTER — Telehealth: Payer: Self-pay

## 2023-11-14 ENCOUNTER — Ambulatory Visit (INDEPENDENT_AMBULATORY_CARE_PROVIDER_SITE_OTHER): Payer: Self-pay

## 2023-11-14 ENCOUNTER — Telehealth: Payer: Self-pay | Admitting: Adult Health

## 2023-11-14 DIAGNOSIS — Z7901 Long term (current) use of anticoagulants: Secondary | ICD-10-CM | POA: Diagnosis not present

## 2023-11-14 DIAGNOSIS — F411 Generalized anxiety disorder: Secondary | ICD-10-CM

## 2023-11-14 LAB — BASIC METABOLIC PANEL WITH GFR
BUN/Creatinine Ratio: 11 — ABNORMAL LOW (ref 12–28)
BUN: 11 mg/dL (ref 8–27)
CO2: 22 mmol/L (ref 20–29)
Calcium: 9.3 mg/dL (ref 8.7–10.3)
Chloride: 101 mmol/L (ref 96–106)
Creatinine, Ser: 0.98 mg/dL (ref 0.57–1.00)
Glucose: 107 mg/dL — ABNORMAL HIGH (ref 70–99)
Potassium: 4.6 mmol/L (ref 3.5–5.2)
Sodium: 141 mmol/L (ref 134–144)
eGFR: 66 mL/min/1.73 (ref >59)

## 2023-11-14 LAB — CBC
Hematocrit: 41.2 % (ref 34.0–46.6)
Hemoglobin: 12.9 g/dL (ref 11.1–15.9)
MCH: 25.7 pg — ABNORMAL LOW (ref 26.6–33.0)
MCHC: 31.3 g/dL — ABNORMAL LOW (ref 31.5–35.7)
MCV: 82 fL (ref 79–97)
Platelets: 182 x10E3/uL (ref 150–450)
RBC: 5.01 x10E6/uL (ref 3.77–5.28)
RDW: 15.9 % — ABNORMAL HIGH (ref 11.7–15.4)
WBC: 5.6 x10E3/uL (ref 3.4–10.8)

## 2023-11-14 LAB — PROTIME-INR
INR: 2.6 — ABNORMAL HIGH (ref 0.9–1.2)
Prothrombin Time: 27 s — ABNORMAL HIGH (ref 9.1–12.0)

## 2023-11-14 MED ORDER — BUSPIRONE HCL 30 MG PO TABS: 30.0000 mg | ORAL_TABLET | Freq: Two times a day (BID) | ORAL | 0 refills | Status: DC

## 2023-11-14 NOTE — Telephone Encounter (Signed)
 Pt had lab INR yesterday at cardiology. Checked for result last night and it had not returned yet.   Result is in this morning. Pt is in range. Will contact pt this afternoon since out of the office.

## 2023-11-14 NOTE — Patient Instructions (Addendum)
 Pre visit review using our clinic review tool, if applicable. No additional management support is needed unless otherwise documented below in the visit note.  Continue 1 tablet daily except take 1 1/2 tablets on Sunday. Recheck in 1 week.

## 2023-11-14 NOTE — Telephone Encounter (Signed)
 Copied from CRM [id number]. Topic: Clinical - Lab/Test Results >> Nov 14, 2023  9:34 AM Dedra B wrote: Reason for CRM: Pt calling to inform provider that her INR was 2.6 yesterday.

## 2023-11-14 NOTE — Progress Notes (Signed)
 Scheduled for ablation on 9/24. Will need 3 weekly INRs before procedure. Scheduled for 9/2 (lab), 9/9, 9/16. Cardiology increased amiodarone  to 200 mg BID until ablation. This has a major interaction with warfarin and will increase INR. Pt has been on 100 mg. Continue 1 tablet daily except take 1 1/2 tablets on Sunday. Recheck in 1 week. LVM with dosing instructions.

## 2023-11-14 NOTE — Telephone Encounter (Signed)
 Per other encounter, shannon, RN was notified and will contact pt this afternoon

## 2023-11-14 NOTE — Telephone Encounter (Signed)
 Pt called asking for a refill of her buspar  to be sent to [facility]. Next appt 9/18

## 2023-11-14 NOTE — Telephone Encounter (Signed)
 Sent!

## 2023-11-15 ENCOUNTER — Other Ambulatory Visit (HOSPITAL_COMMUNITY): Payer: Self-pay

## 2023-11-15 NOTE — Telephone Encounter (Signed)
 I called [employer] and per plan the patient initiated the prior authorization on her own and it was denied stating [employer] reached out to our office for clarification on DX and add info-I did not receive anything via the PA fax line-unsure if it made it to [facility] fax line either. Unfortunately the only option with this plan is an appeal within 60 days of denial (denied on 10/03/2023). I have asked them to send me the denial letter to the PA fax line. I will send to pharmacist for an appeals review.   Reference number for [id number]

## 2023-11-16 ENCOUNTER — Other Ambulatory Visit: Payer: Self-pay | Admitting: Adult Health

## 2023-11-16 DIAGNOSIS — F310 Bipolar disorder, current episode hypomanic: Secondary | ICD-10-CM

## 2023-11-18 ENCOUNTER — Telehealth: Payer: Self-pay | Admitting: Pharmacist

## 2023-11-18 ENCOUNTER — Other Ambulatory Visit (HOSPITAL_COMMUNITY): Payer: Self-pay

## 2023-11-18 DIAGNOSIS — G43709 Chronic migraine without aura, not intractable, without status migrainosus: Secondary | ICD-10-CM

## 2023-11-18 NOTE — Telephone Encounter (Signed)
 Appeal has been submitted for Nurtec. Will advise when response is received, please be advised that most companies may take 30 days to make a decision. Appeal letter and supporting documentation were faxed to [phone] on 11/18/2023 @1 :11 pm.  Thank you, Devere Pandy, PharmD Clinical Pharmacist  [facility]  Direct Dial: [phone]

## 2023-11-18 NOTE — Telephone Encounter (Signed)
G43.709 

## 2023-11-18 NOTE — Telephone Encounter (Signed)
 Prior Authorization form/request asks a question that requires your assistance. Please see the question below and advise accordingly. The PA will not be submitted until the necessary information is received. Insurance has sent the following questions.  I see it noted in the chart that price was the issue with Emgality and Qulipta. Is there any other additional information we could provided?  Please advise:

## 2023-11-19 ENCOUNTER — Ambulatory Visit

## 2023-11-19 DIAGNOSIS — Z7901 Long term (current) use of anticoagulants: Secondary | ICD-10-CM

## 2023-11-19 LAB — POCT INR: INR: 3.8 — AB (ref 2.0–3.0)

## 2023-11-19 NOTE — Patient Instructions (Addendum)
 Pre visit review using our clinic review tool, if applicable. No additional management support is needed unless otherwise documented below in the visit note.  Hold warfarin today and then change weekly dose to take 1 tablet daily. Recheck in 1 week.

## 2023-11-19 NOTE — Progress Notes (Signed)
 Scheduled for ablation on 9/24. Will need 3 weekly INRs before procedure. Scheduled for 9/2 (lab), 9/9, 9/16. Cardiology increased amiodarone  to 200 mg BID until ablation. This has a major interaction with warfarin and will increase INR. Pt has been on 100 mg. Hold warfarin today and then change weekly dose to take 1 tablet daily. Recheck in 1 week. Sent msg to cardiology with INR result.  Pt requested BP check because she reports she has had elevated BP readings at home, 150s/90s. BP today is 138/72, HR 51, O2 sat 98%. Advised if BP readings continue to be 150s/90s, to send a msg to cardiology to make them aware. Pt verbalized understanding.

## 2023-11-25 ENCOUNTER — Encounter: Payer: Self-pay | Admitting: Cardiology

## 2023-11-25 DIAGNOSIS — I10 Essential (primary) hypertension: Secondary | ICD-10-CM

## 2023-11-25 MED ORDER — QULIPTA 60 MG PO TABS: 60.0000 mg | ORAL_TABLET | Freq: Every day | ORAL | 5 refills | Status: DC

## 2023-11-25 NOTE — Telephone Encounter (Signed)
 Per [employer], Electronics engineer for [employer]	[place] has been denied, full letter uploaded to the media tab:

## 2023-11-25 NOTE — Telephone Encounter (Signed)
 Meds ordered this encounter  Medications   Atogepant  (QULIPTA ) 60 MG TABS    Sig: Take 1 tablet (60 mg total) by mouth daily.    Dispense:  30 tablet    Refill:  5

## 2023-11-25 NOTE — Addendum Note (Signed)
 Addended by: Ludwig Tugwell on: 11/25/2023 04:25 PM   Modules accepted: Orders

## 2023-11-26 ENCOUNTER — Ambulatory Visit (INDEPENDENT_AMBULATORY_CARE_PROVIDER_SITE_OTHER)

## 2023-11-26 DIAGNOSIS — Z7901 Long term (current) use of anticoagulants: Secondary | ICD-10-CM

## 2023-11-26 LAB — POCT INR: INR: 1.9 — AB (ref 2.0–3.0)

## 2023-11-26 NOTE — Patient Instructions (Addendum)
 Pre visit review using our clinic review tool, if applicable. No additional management support is needed unless otherwise documented below in the visit note.  Increase dose today to take 1 1/2 tablets and then continue 1 tablet daily. Recheck in 3 week.

## 2023-11-26 NOTE — Progress Notes (Addendum)
 Scheduled for ablation on 9/24. Will need 3 weekly INRs before procedure. Scheduled for 9/2 (lab), 9/9, 9/16. Cardiology increased amiodarone  to 200 mg BID until ablation. This has a major interaction with warfarin and will increase INR. Pt has been on 100 mg. Increase dose today to take 1 1/2 tablets and then continue 1 tablet daily. Recheck in 3 week. Sent msg to cardiology with INR result.

## 2023-11-27 ENCOUNTER — Ambulatory Visit (INDEPENDENT_AMBULATORY_CARE_PROVIDER_SITE_OTHER): Admitting: Internal Medicine

## 2023-11-28 ENCOUNTER — Encounter: Payer: Self-pay | Admitting: Adult Health

## 2023-11-28 ENCOUNTER — Ambulatory Visit: Admitting: Adult Health

## 2023-11-28 DIAGNOSIS — F411 Generalized anxiety disorder: Secondary | ICD-10-CM

## 2023-11-28 DIAGNOSIS — F310 Bipolar disorder, current episode hypomanic: Secondary | ICD-10-CM | POA: Diagnosis not present

## 2023-11-28 DIAGNOSIS — G4700 Insomnia, unspecified: Secondary | ICD-10-CM

## 2023-11-28 DIAGNOSIS — F09 Unspecified mental disorder due to known physiological condition: Secondary | ICD-10-CM | POA: Diagnosis not present

## 2023-11-28 DIAGNOSIS — F4310 Post-traumatic stress disorder, unspecified: Secondary | ICD-10-CM

## 2023-11-28 MED ORDER — LUMATEPERONE TOSYLATE 42 MG PO CAPS: 42.0000 mg | ORAL_CAPSULE | Freq: Every day | ORAL | 0 refills | Status: AC

## 2023-11-28 NOTE — Progress Notes (Signed)
 Emily Rice [id number] Aug 21, 1960 63 y.o.  Subjective:   Patient ID:  Emily Rice is a 63 y.o. (DOB Sep 04, 1960) female.  Chief Complaint: No chief complaint on file.   HPI Emily Rice presents to the office today for follow-up of Bipolar effective disorder, cognitive disorder, GAD, and insomnia.    Describes mood today as ok. Reports tearfulness at times. Mood symptoms - denies depression and irritability. Reports improved interest and motivation. Reports anxiety. Reports panic attacks. Reports worry, rumination and over thinking. Reports obsessive thoughts and acts. Reports mood is mostly stable - little things. Stating I feel like I'm doing ok. Feels like medications are helpful. Taking medications as prescribed.  Energy levels stable. Active, has a regular exercise routine - 5 days a week. Enjoys some usual interests and activities. Married. Lives with husband. Has 2 grown children. Spending time with family. Appetite adequate. Weight loss - working with [facility] weight loss management. Sleeps better some nights than others. Averages 7 hours. Denies daytime napping. Reports ongoing difficulties with focus and concentration. Completing tasks. Managing aspects of household. 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 Denies SI or HI.  Denies AH or VH. Denies self harm. Denies substance use.   CAGE-AID    Flowsheet Row ED to Hosp-Admission (Discharged) from 03/02/2020 in [facility]  CAGE-AID Score 0   PHQ2-9    Flowsheet Row Office Visit from 06/20/2023 in [facility] Clinical Support from 03/29/2023 in [facility] Office Visit from 10/05/2020 in [facility] Office Visit from 04/21/2020 in [facility]  PHQ-2 Total Score 0 0 4 3  PHQ-9 Total Score -- 0 -- 9   Flowsheet Row ED from  08/15/2023 in [facility] Emergency Department at [facility] ED to Hosp-Admission (Discharged) from 07/22/2023 in [facility] ED from 12/06/2022 in [facility] Emergency Department at [facility]  C-SSRS RISK CATEGORY No Risk No Risk No Risk     Review of Systems:  Review of Systems  Musculoskeletal:  Negative for gait problem.  Neurological:  Negative for tremors.  Psychiatric/Behavioral:         Please refer to HPI    Medications: I have reviewed the patient's current medications.  Current Outpatient Medications  Medication Sig Dispense Refill   escitalopram  (LEXAPRO ) 10 MG tablet TAKE 1 TABLET AT BEDTIME 90 tablet 0   QUEtiapine  (SEROQUEL ) 50 MG tablet TAKE 1 TO 2 TABLETS AT BEDTIME 180 tablet 0   acetaminophen  (TYLENOL ) 325 MG tablet Take 2 tablets (650 mg total) by mouth every 6 (six) hours as needed for mild pain or moderate pain. (Patient taking differently: Take 1,300 mg by mouth as needed for mild pain (pain score 1-3) or moderate pain (pain score 4-6).)     amiodarone  (PACERONE ) 200 MG tablet Take 1 tablet (200 mg total) by mouth 2 (two) times daily for 45 days, THEN 1 tablet (200 mg total) daily. 120 tablet 3   aspirin -acetaminophen -caffeine  (EXCEDRIN  MIGRAINE) 250-250-65 MG tablet Take 2 tablets by mouth 2 (two) times daily as needed for migraine.     Atogepant  (QULIPTA ) 60 MG TABS Take 1 tablet (60 mg total) by mouth daily. 30 tablet 5   busPIRone  (BUSPAR ) 30 MG tablet Take 1 tablet (30 mg total) by mouth 2 (two) times daily. 180 tablet 0   calcium  carbonate (  TUMS) 500 MG chewable tablet Chew 1 tablet (200 mg of elemental calcium  total) by mouth daily. 90 tablet 3   cetirizine (ZYRTEC) 10 MG tablet Take 10 mg by mouth daily as needed for allergies.     Cholecalciferol  75 MCG (3000 UT) TABS Take 3,000 Units by mouth daily. 30 tablet 0   clonazePAM  (KLONOPIN ) 0.5 MG tablet Take 1 tablet (0.5 mg total) by mouth 2 (two) times daily. 60 tablet 2    diltiazem  (CARDIZEM ) 30 MG tablet TAKE 1 TABLET EVERY 4 HOURS AS NEEDED FOR AFIB HEART RATE >95 AS LONG AS TOP BP >100. 90 tablet 1   diphenhydrAMINE  (BENADRYL  ALLERGY EXTRA STR) 50 MG tablet Take 1 tablet (50 mg total) by mouth once for 1 dose. Take 1 tablet 1 hour prior to CT scan. (Patient taking differently: Take 50 mg by mouth as needed. Take 1 tablet 1 hour prior to CT scan.) 1 tablet 0   fexofenadine (ALLEGRA) 180 MG tablet Take 180 mg by mouth as needed for allergies or rhinitis.     fluticasone  (FLONASE ) 50 MCG/ACT nasal spray Place 1 spray into both nostrils daily as needed for allergies.      gabapentin  (NEURONTIN ) 300 MG capsule Take 1 capsule (300 mg total) by mouth 3 (three) times daily. 90 capsule 3   lamoTRIgine  (LAMICTAL ) 200 MG tablet TAKE 1 TABLET TWICE DAILY 180 tablet 0   lumateperone  tosylate (CAPLYTA ) 42 MG capsule Take 1 capsule (42 mg total) by mouth daily. 90 capsule 0   metFORMIN  (GLUCOPHAGE -XR) 500 MG 24 hr tablet Take 1 tablet (500 mg total) by mouth 2 (two) times daily with a meal.     Multiple Vitamin (MULTIVITAMIN WITH MINERALS) TABS tablet Take 1 tablet by mouth daily.     ondansetron  (ZOFRAN -ODT) 4 MG disintegrating tablet Take 1 tablet (4 mg total) by mouth every 8 (eight) hours as needed. 20 tablet 6   polyethylene glycol (MIRALAX  / GLYCOLAX ) 17 g packet Take 17 g by mouth 2 (two) times daily. (Patient taking differently: Take 17 g by mouth as needed.) 14 each 0   potassium chloride  SA (KLOR-CON  M) 20 MEQ tablet Take 1 tablet (20 mEq total) by mouth daily. 90 tablet 3   predniSONE  (DELTASONE ) 50 MG tablet Take 50 mg tablet 13 hours prior to CT - Take 50 mg tablet 7 hours prior - Take 50 mg 1 hour prior - Take Benadryl  50 mg 1 hour prior 3 tablet 0   rizatriptan  (MAXALT -MLT) 10 MG disintegrating tablet Take 1 tablet (10 mg total) by mouth as needed. May repeat in 2 hours if needed 10 tablet 6   tiZANidine  (ZANAFLEX ) 4 MG tablet Take 1 tablet (4 mg total) by mouth  every 6 (six) hours as needed. 30 tablet 6   warfarin (COUMADIN ) 5 MG tablet TAKE 1 TABLET (5 MG) BY MOUTH DAILY EXCEPT TAKE 1 1/2 TABLETS (7.5 MG) ON SUNDAY OR AS DIRECTED BY ANTICOAGULATION CLINIC 100 tablet 1   No current facility-administered medications for this visit.    Medication Side Effects: None  Allergies:  Allergies  Allergen Reactions   Hydromorphone  Other (See Comments)    Stroke-like symptoms. treated with Narcan; Tolerated Percocet in the past.   Iodinated Contrast Media Other (See Comments)    Reaction not fully known   Prochlorperazine Swelling and Other (See Comments)    Compazine - tongue swelling    Past Medical History:  Diagnosis Date   A-fib [facility])    Allergy  Anxiety    Bipolar disorder (HCC)    Chondromalacia patellae 12/14/2003   Depression    DVT (deep vein thrombosis) in pregnancy    DVT (deep venous thrombosis) (HCC) 2001 and 2002   DVT (deep venous thrombosis) (HCC)    x 2   Enlarged pancreas 07/02/2023   Fulness of pancreatic head on CT    Esophagitis 06/06/2016   GERD (gastroesophageal reflux disease)    Hemorrhage    Hyperlipidemia    Migraines    Pulmonary embolism (HCC) 2001   S/P cholecystectomy 09/11/2012   Sickle cell anemia (HCC)    Suicidal ideation 12/28/2008   Tremor     Past Medical History, Surgical history, Social history, and Family history were reviewed and updated as appropriate.   Please see review of systems for further details on the patient's review from today.   Objective:   Physical Exam:  LMP  (LMP Unknown)   Physical Exam Constitutional:      General: She is not in acute distress. Musculoskeletal:        General: No deformity.  Neurological:     Mental Status: She is alert and oriented to person, place, and time.     Coordination: Coordination normal.  Psychiatric:        Attention and Perception: Attention and perception normal. She does not perceive auditory or visual hallucinations.         Mood and Affect: Mood normal. Mood is not anxious or depressed. Affect is not labile, blunt, angry or inappropriate.        Speech: Speech normal.        Behavior: Behavior normal.        Thought Content: Thought content normal. Thought content is not paranoid or delusional. Thought content does not include homicidal or suicidal ideation. Thought content does not include homicidal or suicidal plan.        Cognition and Memory: Cognition and memory normal.        Judgment: Judgment normal.     Comments: Insight intact     Lab Review:     Component Value Date/Time   NA 141 11/13/2023 1037   K 4.6 11/13/2023 1037   CL 101 11/13/2023 1037   CO2 22 11/13/2023 1037   GLUCOSE 107 (H) 11/13/2023 1037   GLUCOSE 78 09/04/2023 0851   BUN 11 11/13/2023 1037   CREATININE 0.98 11/13/2023 1037   CALCIUM  9.3 11/13/2023 1037   PROT 7.2 10/01/2023 1058   ALBUMIN  4.4 10/01/2023 1058   AST 29 10/01/2023 1058   ALT 22 10/01/2023 1058   ALKPHOS 81 10/01/2023 1058   BILITOT 0.3 10/01/2023 1058   GFRNONAA >60 08/15/2023 1432   GFRAA >60 12/10/2019 1600       Component Value Date/Time   WBC 5.6 11/13/2023 1037   WBC 6.7 09/04/2023 0851   RBC 5.01 11/13/2023 1037   RBC 4.76 09/04/2023 0851   HGB 12.9 11/13/2023 1037   HCT 41.2 11/13/2023 1037   PLT 182 11/13/2023 1037   MCV 82 11/13/2023 1037   MCH 25.7 (L) 11/13/2023 1037   MCH 26.9 08/15/2023 1432   MCHC 31.3 (L) 11/13/2023 1037   MCHC 32.2 09/04/2023 0851   RDW 15.9 (H) 11/13/2023 1037   LYMPHSABS 2.5 08/15/2023 1432   MONOABS 0.4 08/15/2023 1432   EOSABS 0.1 08/15/2023 1432   BASOSABS 0.0 08/15/2023 1432    No results found for: POCLITH, LITHIUM   No results found for: PHENYTOIN, PHENOBARB, VALPROATE, CBMZ   .  res Assessment: Plan:    Plan:  PDMP reviewed  Continue:  Seroquel  100mg  at bedtime   Clonazepam  0.5mg  BID  Caplyta  42mg  daily - using samples due to cost Lamictal  200mg  twice daily Lexapro  10mg   daily Buspar  30mg  BID for anxiety - dose increase  RTC 6 weeks  25 minutes spent dedicated to the care of this patient on the date of this encounter to include pre-visit review of records, ordering of medication, post visit documentation, and face-to-face time with the patient discussing Bipolar effective disorder, cognitive disorder, GAD, and insomnia. Discussed continuing current medication regimen. Also spoke with patient's husband about her current medications and treatment.   Seeing therapist - Marval Bunde  Patient advised to contact office with any questions, adverse effects, or acute worsening in signs and symptoms.   Discussed potential benefits, risk, and side effects of benzodiazepines to include potential risk of tolerance and dependence, as well as possible drowsiness. Advised patient not to drive if experiencing drowsiness and to take lowest possible effective dose to minimize risk of dependence and tolerance.   Discussed potential metabolic side effects associated with atypical antipsychotics, as well as potential risk for movement side effects. Advised pt to contact office if movement side effects occur.    There are no diagnoses linked to this encounter.   Please see After Visit Summary for patient specific instructions.  Future Appointments  Date Time Provider Department Center  12/03/2023  8:00 AM Francyne Romano, MD [facility] None  12/17/2023  9:30 AM [facility] COUMADIN  CLINIC [facility]  02/19/2024  8:40 AM Lendia Boby CROME, NP-C [facility]  08/11/2024  8:00 AM Henson, Vickie L, NP-C [facility]    No orders of the defined types were placed in this encounter.   -------------------------------

## 2023-11-29 ENCOUNTER — Other Ambulatory Visit: Payer: Self-pay | Admitting: Neurology

## 2023-11-29 NOTE — Telephone Encounter (Signed)
 Maria A. from [facility] called stating that the pt has called them informing them that [facility] has not received the rizatriptan  (MAXALT -MLT) 10 MG disintegrating tablet Rx from our office and she is needing a refill. Please advise.

## 2023-12-02 MED ORDER — RIZATRIPTAN BENZOATE 10 MG PO TBDP: 10.0000 mg | ORAL_TABLET | ORAL | 6 refills | Status: DC | PRN

## 2023-12-02 NOTE — Addendum Note (Signed)
 Addended by: ONEITA NEVELYN BRAVO on: 12/02/2023 09:12 AM   Modules accepted: Orders

## 2023-12-02 NOTE — Telephone Encounter (Signed)
 Dr. Onita, please calrify: From my understanding we aren't refilling as in providers last note stated: previously tried and failed abortive treatment such as Imitrex , Maxalt , nazatriptan

## 2023-12-02 NOTE — Telephone Encounter (Signed)
 From my understanding we aren't refilling as in providers last note stated: previously tried and failed abortive treatment such as Imitrex , Maxalt , nazatriptan

## 2023-12-03 ENCOUNTER — Encounter (INDEPENDENT_AMBULATORY_CARE_PROVIDER_SITE_OTHER): Payer: Self-pay | Admitting: Internal Medicine

## 2023-12-03 ENCOUNTER — Ambulatory Visit (INDEPENDENT_AMBULATORY_CARE_PROVIDER_SITE_OTHER): Admitting: Internal Medicine

## 2023-12-03 ENCOUNTER — Other Ambulatory Visit: Payer: Self-pay

## 2023-12-03 VITALS — BP 121/73 | HR 55 | Temp 98.1°F | Ht 63.0 in | Wt 170.0 lb

## 2023-12-03 DIAGNOSIS — N1831 Chronic kidney disease, stage 3a: Secondary | ICD-10-CM | POA: Diagnosis not present

## 2023-12-03 DIAGNOSIS — R7303 Prediabetes: Secondary | ICD-10-CM | POA: Diagnosis not present

## 2023-12-03 DIAGNOSIS — I129 Hypertensive chronic kidney disease with stage 1 through stage 4 chronic kidney disease, or unspecified chronic kidney disease: Secondary | ICD-10-CM | POA: Diagnosis not present

## 2023-12-03 DIAGNOSIS — T43595D Adverse effect of other antipsychotics and neuroleptics, subsequent encounter: Secondary | ICD-10-CM

## 2023-12-03 DIAGNOSIS — E66811 Obesity, class 1: Secondary | ICD-10-CM

## 2023-12-03 DIAGNOSIS — Z6831 Body mass index (BMI) 31.0-31.9, adult: Secondary | ICD-10-CM

## 2023-12-03 DIAGNOSIS — T50905A Adverse effect of unspecified drugs, medicaments and biological substances, initial encounter: Secondary | ICD-10-CM

## 2023-12-03 DIAGNOSIS — I10 Essential (primary) hypertension: Secondary | ICD-10-CM

## 2023-12-03 MED ORDER — METFORMIN HCL ER 500 MG PO TB24: 500.0000 mg | ORAL_TABLET | Freq: Two times a day (BID) | ORAL | 0 refills | Status: DC

## 2023-12-03 MED ORDER — OMEPRAZOLE 40 MG PO CPDR: 40.0000 mg | DELAYED_RELEASE_CAPSULE | Freq: Every day | ORAL | 1 refills | Status: DC

## 2023-12-03 NOTE — Assessment & Plan Note (Signed)
 Most recent A1c is  Lab Results  Component Value Date   HGBA1C 5.5 07/25/2023   HGBA1C 5.8 (H) 07/31/2021    Patient aware of disease state and risk of progression. This may contribute to abnormal cravings, fatigue and diabetic complications without having diabetes.   Advised to maintain a diet low on simple and processed carbohydrates.  Continue metformin  XR 500 mg twice a day for pharmacoprophylaxis and weight management

## 2023-12-03 NOTE — Progress Notes (Signed)
 Office: [phone]  /  Fax: [phone]  Weight Summary and Body Composition Analysis (BIA)  Vitals Temp: 98.1 F (36.7 C) BP: 121/73 Pulse Rate: (!) 55 SpO2: 96 %   Anthropometric Measurements Height: 5' 3 (1.6 m) Weight: 170 lb (77.1 kg) BMI (Calculated): 30.12 Weight at Last Visit: 167 lb Weight Lost Since Last Visit: 0 lb Weight Gained Since Last Visit: 3 lb Starting Weight: 179 lb Total Weight Loss (lbs): 7 lb (3.175 kg) Peak Weight: 203 lb   Body Composition  Body Fat %: 40.5 % Fat Mass (lbs): 69.2 lbs Muscle Mass (lbs): 96.4 lbs Total Body Water (lbs): 69.8 lbs Visceral Fat Rating : 10    No data recorded Today's Visit #: 8  Starting Date: 03/14/23   Subjective   Chief Complaint: Obesity  Interval History Discussed the use of AI scribe software for clinical note transcription with the patient, who gave verbal consent to proceed.  History of Present Illness Emily Rice is a 63 year old female who presents for medical weight management.  She has gained three pounds since her last visit despite exercising six days a week for about fifty minutes each session and following a plant-based diet with fair adherence. She consumes two to three meals a day, avoids eating after 7 PM, and her meals typically include seafood, vegetables, and whole grains. She occasionally uses protein shakes with 21 grams of protein and includes [place] yogurt and almond milk in her diet.  She is taking medications including Seroquel , Caplyta , and gabapentin , which she and her clinician have discussed as potentially affecting her weight. She is also on clonazepam  and other medications, leading to polypharmacy. She has limited treatment options for anti-obesity medications due to lack of coverage for GLP-1 and contraindications to sympathomimetics and antiepileptics. She is currently on metformin  for diabetes prevention and weight management, taking 500 mg twice a day.  She feels  anxious about an upcoming ablation procedure. Her amiodarone  dosage was recently increased to 200 mg twice a day from 200 mg once a day. She experiences fatigue and changes in heart rate associated with atrial fibrillation, which sometimes affects her ability to exercise.  She has a history of traumatic brain injury, hypertension, atrial fibrillation, deep vein thrombosis, chronic kidney disease stage 3A, bipolar affective disorder, prediabetes, and pulmonary embolism. She has had three DVTs and is concerned about the impact of her medications on her weight. She is frustrated with her weight gain despite her efforts in diet and exercise.     Challenges affecting patient progress: difficulty maintaining a reduced calorie state, limited food variation or intolerances, medical comorbidities, presence of obesogenic drugs, menopause, inadequate response to nutritional and behavioral strategies, and Limited access to AOM.    Pharmacotherapy for weight management: She is currently taking Metformin  (off label use for weight management and / or insulin  resistance and / or diabetes prevention) with adequate clinical response  and without side effects..   Assessment and Plan   Treatment Plan For Obesity:  Recommended Dietary Goals  Arlinda is currently in the action stage of change. As such, her goal is to continue weight management plan. She has agreed to: follow tailored, multi-day,whole foods, low-carbohydrate, high protein plan targeting 1000 calories and 75 grams of protein per day  Behavioral Health and Counseling  We discussed the following behavioral modification strategies today: work on tracking and journaling calories using tracking application, continue to work on maintaining a reduced calorie state, getting the recommended amount of protein,  incorporating whole foods, making healthy choices, staying well hydrated and practicing mindfulness when eating., increase protein intake, fibrous foods (25  grams per day for women, 30 grams for men) and water to improve satiety and decrease hunger signals. , avoid all or none thinking, and display self-compassion.  Additional education and resources provided today: New meal plan provided  Recommended Physical Activity Goals  Marrietta has been advised to work up to 150 minutes of moderate intensity aerobic activity a week and strengthening exercises 2-3 times per week for cardiovascular health, weight loss maintenance and preservation of muscle mass.  She has agreed to :  Continue current level of physical activity   Medical Interventions and Pharmacotherapy  We discussed various medication options to help Bryella with her weight loss efforts and we both agreed to : Continue metformin  for diabetes prevention and to offset weight gain associated with psychotropic medication.  She does not have coverage for GLP-1, has contraindication to sympathomimetics, we cannot use antiepileptics due to the presence of CNS depressing drugs and problems with memory.  Very limited treatment options due to underlying conditions and polypharmacy.  Associated Conditions Impacted by Obesity Treatment  Assessment & Plan Benign essential hypertension Vitals:   12/03/23 0700  BP: 121/73    Blood pressure is at goal for age and risk category.  On diltiazem  without adverse effects.  Most recent renal parameters reviewed which showed stable electrolytes and kidney function.  Continue with weight loss therapy. Losing 10% may improve blood pressure control. Monitor for symptoms of orthostasis while losing weight. Continue current regimen and home monitoring for a goal blood pressure of < 120/80.   Prediabetes Most recent A1c is  Lab Results  Component Value Date   HGBA1C 5.5 07/25/2023   HGBA1C 5.8 (H) 07/31/2021    Patient aware of disease state and risk of progression. This may contribute to abnormal cravings, fatigue and diabetic complications without having  diabetes.   Advised to maintain a diet low on simple and processed carbohydrates.  Continue metformin  XR 500 mg twice a day for pharmacoprophylaxis and weight management  Weight gain due to medication Patient is on Seroquel  as she also takes several anticholinergic drugs which may contribute to weight gain.  Continue metformin  to offset weight gain associated with psychotropic medication. Chronic kidney disease, stage 3a (HCC) Her kidney function tends to fluctuate most recent GFR is 57 blood pressure is well-controlled.  Patient counseled on maintaining adequate hydration and avoiding nephrotoxins. 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 Despite adherence to a plant-based diet and regular exercise, she has gained three pounds since the last visit. Limited treatment options are available due to contraindications and lack of insurance coverage for GLP-1 agonists. Metformin  is used to help offset weight gain associated with medications. - Provide a seven-day meal plan targeting 1000 calories per day with moderate protein, plant-based diet including eggs and seafood. - Encourage the use of tracking apps like My Fitness Pal or lose it to monitor calorie and protein intake. - Advise on intermittent fasting with a 16:8 schedule, starting with a protein shake or bar and a piece of fruit, followed by lunch and dinner. - Continue metformin  500 mg twice a day. - Encourage continuation of current exercise regimen, aiming for  300 minutes of exercise per week.        Objective   Physical Exam:  Blood pressure 121/73, pulse (!) 55, temperature 98.1 F (36.7 C), height 5' 3 (1.6 m), weight 170 lb (77.1 kg), SpO2 96%. Body  mass index is 30.11 kg/m.  General: She is overweight, cooperative, alert, well developed, and in no acute distress. PSYCH: Has normal mood, affect and thought process.   HEENT: EOMI, sclerae are anicteric. Lungs: Normal breathing effort, no conversational dyspnea. Extremities: No edema.  Neurologic: No gross sensory or motor deficits. No tremors or fasciculations noted.    Diagnostic Data Reviewed:  BMET    Component Value Date/Time   NA 141 11/13/2023 1037   K 4.6 11/13/2023 1037   CL 101 11/13/2023 1037   CO2 22 11/13/2023 1037   GLUCOSE 107 (H) 11/13/2023 1037   GLUCOSE 78 09/04/2023 0851   BUN 11 11/13/2023 1037   CREATININE 0.98 11/13/2023 1037   CALCIUM  9.3 11/13/2023 1037   GFRNONAA >60 08/15/2023 1432   GFRAA >60 12/10/2019 1600   Lab Results  Component Value Date   HGBA1C 5.5 07/25/2023   HGBA1C 5.8 (H) 07/31/2021   Lab Results  Component Value Date   INSULIN  8.7 03/14/2023   Lab Results  Component Value Date   TSH 2.260 08/14/2023   CBC    Component Value Date/Time   WBC 5.6 11/13/2023 1037   WBC 6.7 09/04/2023 0851   RBC 5.01 11/13/2023 1037   RBC 4.76 09/04/2023 0851   HGB 12.9 11/13/2023 1037   HCT 41.2 11/13/2023 1037   PLT 182 11/13/2023 1037   MCV 82 11/13/2023 1037   MCH 25.7 (L) 11/13/2023 1037   MCH 26.9 08/15/2023 1432   MCHC 31.3 (L) 11/13/2023 1037   MCHC 32.2 09/04/2023 0851   RDW 15.9 (H) 11/13/2023 1037   Iron Studies No results found for: IRON, TIBC, FERRITIN, IRONPCTSAT Lipid Panel     Component Value Date/Time   CHOL 132 07/25/2023 0502   CHOL 193 02/27/2021 1013   TRIG 76 07/25/2023 0502   HDL 56 07/25/2023 0502   HDL 60 02/27/2021 1013   CHOLHDL 2.4 07/25/2023 0502   VLDL 15 07/25/2023 0502   LDLCALC 61 07/25/2023 0502   LDLCALC 115 (H) 02/27/2021 1013   LDLDIRECT 111 (H) 02/27/2021 1013   Hepatic Function Panel     Component Value Date/Time   PROT 7.2 10/01/2023 1058   ALBUMIN  4.4 10/01/2023 1058    AST 29 10/01/2023 1058   ALT 22 10/01/2023 1058   ALKPHOS 81 10/01/2023 1058   BILITOT 0.3 10/01/2023 1058   BILIDIR <0.1 07/23/2023 0528   IBILI NOT CALCULATED 07/23/2023 0528      Component Value Date/Time   TSH 2.260 08/14/2023 1341   Nutritional Lab Results  Component Value Date   VD25OH 56.84 10/17/2023   VD25OH 54.08 07/26/2023   VD25OH 59.4 03/14/2023    Medications: Outpatient Encounter Medications as of 12/03/2023  Medication Sig Note   acetaminophen  (TYLENOL ) 325 MG tablet Take 2 tablets (650 mg total) by mouth every 6 (six) hours as needed for mild pain or moderate pain. (Patient not taking: Reported on 12/03/2023)    amiodarone  (PACERONE ) 200 MG tablet Take 1 tablet (200 mg total) by mouth 2 (two) times daily for 45 days, THEN 1 tablet (200 mg total) daily.    aspirin -acetaminophen -caffeine  (EXCEDRIN  MIGRAINE) 250-250-65 MG tablet Take 2 tablets by mouth 2 (two) times daily as needed for migraine.  Atogepant  (QULIPTA ) 60 MG TABS Take 1 tablet (60 mg total) by mouth daily. (Patient not taking: Reported on 12/02/2023)    busPIRone  (BUSPAR ) 30 MG tablet Take 1 tablet (30 mg total) by mouth 2 (two) times daily.    calcium  carbonate (TUMS) 500 MG chewable tablet Chew 1 tablet (200 mg of elemental calcium  total) by mouth daily.    cetirizine (ZYRTEC) 10 MG tablet Take 10 mg by mouth daily as needed for allergies.    Cholecalciferol  75 MCG (3000 UT) TABS Take 3,000 Units by mouth daily.    clonazePAM  (KLONOPIN ) 0.5 MG tablet Take 1 tablet (0.5 mg total) by mouth 2 (two) times daily.    diltiazem  (CARDIZEM ) 30 MG tablet TAKE 1 TABLET EVERY 4 HOURS AS NEEDED FOR AFIB HEART RATE >95 AS LONG AS TOP BP >100.    escitalopram  (LEXAPRO ) 10 MG tablet TAKE 1 TABLET AT BEDTIME    fexofenadine (ALLEGRA) 180 MG tablet Take 180 mg by mouth as needed for allergies or rhinitis.    fluticasone  (FLONASE ) 50 MCG/ACT nasal spray Place 1 spray into both nostrils daily as needed for allergies.      gabapentin  (NEURONTIN ) 300 MG capsule Take 1 capsule (300 mg total) by mouth 3 (three) times daily. (Patient taking differently: Take 300 mg by mouth daily as needed (migraine).)    lamoTRIgine  (LAMICTAL ) 200 MG tablet TAKE 1 TABLET TWICE DAILY    lumateperone  tosylate (CAPLYTA ) 42 MG capsule Take 1 capsule (42 mg total) by mouth daily.    metFORMIN  (GLUCOPHAGE -XR) 500 MG 24 hr tablet Take 1 tablet (500 mg total) by mouth 2 (two) times daily with a meal.    Multiple Vitamin (MULTIVITAMIN WITH MINERALS) TABS tablet Take 1 tablet by mouth daily.    ondansetron  (ZOFRAN -ODT) 4 MG disintegrating tablet Take 1 tablet (4 mg total) by mouth every 8 (eight) hours as needed. 12/02/2023: Has not received yet   polyethylene glycol (MIRALAX  / GLYCOLAX ) 17 g packet Take 17 g by mouth 2 (two) times daily. (Patient taking differently: Take 17 g by mouth daily as needed for moderate constipation.)    potassium chloride  SA (KLOR-CON  M) 20 MEQ tablet Take 1 tablet (20 mEq total) by mouth daily.    QUEtiapine  (SEROQUEL ) 50 MG tablet TAKE 1 TO 2 TABLETS AT BEDTIME    rizatriptan  (MAXALT -MLT) 10 MG disintegrating tablet Take 1 tablet (10 mg total) by mouth as needed. May repeat in 2 hours if needed 12/02/2023: Has not received yet   tiZANidine  (ZANAFLEX ) 4 MG tablet Take 1 tablet (4 mg total) by mouth every 6 (six) hours as needed.    warfarin (COUMADIN ) 5 MG tablet TAKE 1 TABLET (5 MG) BY MOUTH DAILY EXCEPT TAKE 1 1/2 TABLETS (7.5 MG) ON SUNDAY OR AS DIRECTED BY ANTICOAGULATION CLINIC    [DISCONTINUED] diphenhydrAMINE  (BENADRYL  ALLERGY EXTRA STR) 50 MG tablet Take 1 tablet (50 mg total) by mouth once for 1 dose. Take 1 tablet 1 hour prior to CT scan. (Patient not taking: Reported on 12/02/2023)    [DISCONTINUED] metFORMIN  (GLUCOPHAGE -XR) 500 MG 24 hr tablet Take 1 tablet (500 mg total) by mouth 2 (two) times daily with a meal.    [DISCONTINUED] predniSONE  (DELTASONE ) 50 MG tablet Take 50 mg tablet 13 hours prior to CT - Take  50 mg tablet 7 hours prior - Take 50 mg 1 hour prior - Take Benadryl  50 mg 1 hour prior (Patient not taking: Reported on 12/02/2023)    No facility-administered encounter medications on  file as of 12/03/2023.     Follow-Up   Return in about 4 weeks (around 12/31/2023) for For Weight Mangement with Dr. Francyne.SABRA She was informed of the importance of frequent follow up visits to maximize her success with intensive lifestyle modifications for her multiple health conditions.  Attestation Statement   Reviewed by clinician on day of visit: allergies, medications, problem list, medical history, surgical history, family history, social history, and previous encounter notes.     Lucas Francyne, MD

## 2023-12-03 NOTE — Assessment & Plan Note (Signed)
 Vitals:   12/03/23 0700  BP: 121/73    Blood pressure is at goal for age and risk category.  On diltiazem  without adverse effects.  Most recent renal parameters reviewed which showed stable electrolytes and kidney function.  Continue with weight loss therapy. Losing 10% may improve blood pressure control. Monitor for symptoms of orthostasis while losing weight. Continue current regimen and home monitoring for a goal blood pressure of < 120/80.

## 2023-12-03 NOTE — Assessment & Plan Note (Signed)
 Patient is on Seroquel  as she also takes several anticholinergic drugs which may contribute to weight gain.  Continue metformin  to offset weight gain associated with psychotropic medication.

## 2023-12-03 NOTE — Pre-Procedure Instructions (Signed)
 Attempted to call patient regarding procedure instructions.  Left voicemail on the following items: Arrival time 1000 Nothing to eat or drink after midnight No meds AM of procedure Responsible person to drive you home and stay with you for 24 hrs  Have you missed any doses of anti-coagulant Coumadin - should be taken once a day, if you have missed any doses please let us  know.

## 2023-12-03 NOTE — Assessment & Plan Note (Signed)
 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 Despite adherence to a plant-based diet and regular exercise, she has gained three pounds since the last visit. Limited treatment options are available due to contraindications and lack of insurance coverage for GLP-1 agonists. Metformin  is used to help offset weight gain associated with medications. - Provide a seven-day meal plan targeting 1000 calories per day with moderate protein, plant-based diet including eggs and seafood. - Encourage the use of tracking apps like My Fitness Pal or lose it to monitor calorie and protein intake. - Advise on intermittent fasting with a 16:8 schedule, starting with a protein shake or bar and a piece of fruit, followed by lunch and dinner. - Continue metformin  500 mg twice a day. - Encourage continuation of current exercise regimen, aiming for 300 minutes of exercise per week.

## 2023-12-03 NOTE — Assessment & Plan Note (Signed)
 Her kidney function tends to fluctuate most recent GFR is 57 blood pressure is well-controlled.  Patient counseled on maintaining adequate hydration and avoiding nephrotoxins.

## 2023-12-03 NOTE — Telephone Encounter (Signed)
 Looks like she is scheduled for an ablation tomorrow.  Would have her start Losartan  25mg  po qAM - most common side effects coughing (let us  know if that occurs).  BMP in one week.   Zenora Karpel [place], DO, FACC

## 2023-12-04 ENCOUNTER — Other Ambulatory Visit: Payer: Self-pay

## 2023-12-04 ENCOUNTER — Ambulatory Visit (HOSPITAL_COMMUNITY): Payer: Self-pay | Admitting: Certified Registered"

## 2023-12-04 ENCOUNTER — Other Ambulatory Visit (HOSPITAL_COMMUNITY): Payer: Self-pay

## 2023-12-04 ENCOUNTER — Ambulatory Visit (HOSPITAL_COMMUNITY)
Admission: RE | Admit: 2023-12-04 | Discharge: 2023-12-04 | Disposition: A | Discharge status: Home / Self Care | Attending: Cardiology | Admitting: Cardiology

## 2023-12-04 ENCOUNTER — Ambulatory Visit (HOSPITAL_COMMUNITY)
Admission: RE | Disposition: A | Discharge status: Home / Self Care | Payer: Self-pay | Source: Home / Self Care | Attending: Cardiology

## 2023-12-04 DIAGNOSIS — Z86718 Personal history of other venous thrombosis and embolism: Secondary | ICD-10-CM | POA: Insufficient documentation

## 2023-12-04 DIAGNOSIS — Z79899 Other long term (current) drug therapy: Secondary | ICD-10-CM | POA: Diagnosis not present

## 2023-12-04 DIAGNOSIS — D573 Sickle-cell trait: Secondary | ICD-10-CM | POA: Insufficient documentation

## 2023-12-04 DIAGNOSIS — F319 Bipolar disorder, unspecified: Secondary | ICD-10-CM | POA: Insufficient documentation

## 2023-12-04 DIAGNOSIS — I480 Paroxysmal atrial fibrillation: Secondary | ICD-10-CM | POA: Diagnosis not present

## 2023-12-04 DIAGNOSIS — K219 Gastro-esophageal reflux disease without esophagitis: Secondary | ICD-10-CM | POA: Diagnosis not present

## 2023-12-04 DIAGNOSIS — I4891 Unspecified atrial fibrillation: Secondary | ICD-10-CM

## 2023-12-04 DIAGNOSIS — I10 Essential (primary) hypertension: Secondary | ICD-10-CM | POA: Insufficient documentation

## 2023-12-04 HISTORY — PX: ATRIAL FIBRILLATION ABLATION: EP1191

## 2023-12-04 LAB — PROTIME-INR
INR: 2.2 — ABNORMAL HIGH (ref 0.8–1.2)
Prothrombin Time: 25.9 s — ABNORMAL HIGH (ref 11.4–15.2)

## 2023-12-04 LAB — POCT ACTIVATED CLOTTING TIME: Activated Clotting Time: 285 s

## 2023-12-04 SURGERY — ATRIAL FIBRILLATION ABLATION
Anesthesia: General

## 2023-12-04 MED ORDER — PROTAMINE SULFATE 10 MG/ML IV SOLN
INTRAVENOUS | Status: DC | PRN
  Administered 2023-12-04: 35 mg via INTRAVENOUS

## 2023-12-04 MED ORDER — ATROPINE SULFATE 1 MG/10ML IJ SOSY
PREFILLED_SYRINGE | INTRAMUSCULAR | Status: DC | PRN
  Administered 2023-12-04: 1 mg via INTRAVENOUS

## 2023-12-04 MED ORDER — PROPOFOL 10 MG/ML IV BOLUS
INTRAVENOUS | Status: DC | PRN
Start: 2023-12-04 — End: 2023-12-04
  Administered 2023-12-04: 170 mg via INTRAVENOUS

## 2023-12-04 MED ORDER — MIDAZOLAM HCL 5 MG/5ML IJ SOLN
INTRAMUSCULAR | Status: DC | PRN
  Administered 2023-12-04: 2 mg via INTRAVENOUS

## 2023-12-04 MED ORDER — SODIUM CHLORIDE 0.9% FLUSH: 3.0000 mL | INTRAVENOUS | Status: DC | PRN

## 2023-12-04 MED ORDER — ROCURONIUM BROMIDE 10 MG/ML (PF) SYRINGE
PREFILLED_SYRINGE | INTRAVENOUS | Status: DC | PRN
  Administered 2023-12-04 (×2): 10 mg via INTRAVENOUS
  Administered 2023-12-04: 50 mg via INTRAVENOUS

## 2023-12-04 MED ORDER — FENTANYL CITRATE (PF) 100 MCG/2ML IJ SOLN
INTRAMUSCULAR | Status: AC
  Filled 2023-12-04: qty 2

## 2023-12-04 MED ORDER — SODIUM CHLORIDE 0.9% FLUSH: 3.0000 mL | Freq: Two times a day (BID) | INTRAVENOUS | Status: DC

## 2023-12-04 MED ORDER — ONDANSETRON HCL 4 MG/2ML IJ SOLN: 4.0000 mg | Freq: Four times a day (QID) | INTRAMUSCULAR | Status: DC | PRN

## 2023-12-04 MED ORDER — PHENYLEPHRINE HCL-NACL 20-0.9 MG/250ML-% IV SOLN
INTRAVENOUS | Status: DC | PRN
  Administered 2023-12-04: 25 ug/min via INTRAVENOUS

## 2023-12-04 MED ORDER — ONDANSETRON HCL 4 MG/2ML IJ SOLN
INTRAMUSCULAR | Status: DC | PRN
  Administered 2023-12-04: 4 mg via INTRAVENOUS

## 2023-12-04 MED ORDER — LIDOCAINE 2% (20 MG/ML) 5 ML SYRINGE
INTRAMUSCULAR | Status: DC | PRN
  Administered 2023-12-04: 60 mg via INTRAVENOUS

## 2023-12-04 MED ORDER — PANTOPRAZOLE SODIUM 40 MG PO TBEC
40.0000 mg | DELAYED_RELEASE_TABLET | Freq: Every day | ORAL | Status: DC
  Administered 2023-12-04: 40 mg via ORAL
  Filled 2023-12-04: qty 1

## 2023-12-04 MED ORDER — ATROPINE SULFATE 1 MG/10ML IJ SOSY
PREFILLED_SYRINGE | INTRAMUSCULAR | Status: AC
Start: 2023-12-04 — End: 2023-12-04
  Filled 2023-12-04: qty 10

## 2023-12-04 MED ORDER — COLCHICINE 0.6 MG PO TABS
0.6000 mg | ORAL_TABLET | Freq: Two times a day (BID) | ORAL | 0 refills | Status: DC
  Filled 2023-12-04: qty 10, 5d supply, fill #0

## 2023-12-04 MED ORDER — HEPARIN (PORCINE) IN NACL 1000-0.9 UT/500ML-% IV SOLN
INTRAVENOUS | Status: DC | PRN
  Administered 2023-12-04 (×3): 500 mL

## 2023-12-04 MED ORDER — SODIUM CHLORIDE 0.9 % IV SOLN: INTRAVENOUS | Status: DC

## 2023-12-04 MED ORDER — HEPARIN SODIUM (PORCINE) 1000 UNIT/ML IJ SOLN
INTRAMUSCULAR | Status: AC
Start: 2023-12-04 — End: 2023-12-04
  Filled 2023-12-04: qty 20

## 2023-12-04 MED ORDER — SUGAMMADEX SODIUM 200 MG/2ML IV SOLN
INTRAVENOUS | Status: DC | PRN
  Administered 2023-12-04: 50 mg via INTRAVENOUS
  Administered 2023-12-04: 150 mg via INTRAVENOUS

## 2023-12-04 MED ORDER — PROPOFOL 500 MG/50ML IV EMUL
INTRAVENOUS | Status: DC | PRN
  Administered 2023-12-04: 150 ug/kg/min via INTRAVENOUS

## 2023-12-04 MED ORDER — PANTOPRAZOLE SODIUM 40 MG PO TBEC
40.0000 mg | DELAYED_RELEASE_TABLET | Freq: Every day | ORAL | 0 refills | Status: DC
  Filled 2023-12-04: qty 45, 45d supply, fill #0

## 2023-12-04 MED ORDER — COLCHICINE 0.6 MG PO TABS
0.6000 mg | ORAL_TABLET | Freq: Two times a day (BID) | ORAL | Status: DC
  Administered 2023-12-04: 0.6 mg via ORAL
  Filled 2023-12-04: qty 1

## 2023-12-04 MED ORDER — MIDAZOLAM HCL 2 MG/2ML IJ SOLN
INTRAMUSCULAR | Status: AC
  Filled 2023-12-04: qty 2

## 2023-12-04 MED ORDER — HEPARIN SODIUM (PORCINE) 1000 UNIT/ML IJ SOLN
INTRAMUSCULAR | Status: DC | PRN
Start: 2023-12-04 — End: 2023-12-04
  Administered 2023-12-04: 8000 [IU] via INTRAVENOUS
  Administered 2023-12-04: 2000 [IU] via INTRAVENOUS

## 2023-12-04 MED ORDER — ACETAMINOPHEN 325 MG PO TABS: 650.0000 mg | ORAL_TABLET | ORAL | Status: DC | PRN

## 2023-12-04 MED ORDER — FENTANYL CITRATE (PF) 250 MCG/5ML IJ SOLN
INTRAMUSCULAR | Status: DC | PRN
  Administered 2023-12-04: 50 ug via INTRAVENOUS

## 2023-12-04 MED ORDER — DEXAMETHASONE SODIUM PHOSPHATE 10 MG/ML IJ SOLN
INTRAMUSCULAR | Status: DC | PRN
  Administered 2023-12-04: 10 mg via INTRAVENOUS

## 2023-12-04 MED ORDER — SODIUM CHLORIDE 0.9 % IV SOLN: 250.0000 mL | INTRAVENOUS | Status: DC | PRN

## 2023-12-04 SURGICAL SUPPLY — 17 items
CABLE FARASTAR GEN2 SNGL USE (CABLE) IMPLANT
CATH FARAWAVE 2.0 31 (CATHETERS) IMPLANT
CATH GE 8FR SOUNDSTAR (CATHETERS) IMPLANT
CATH OCTARAY 2.0 F 3-3-3-3-3 (CATHETERS) IMPLANT
CATH WEBSTER BI DIR CS D-F CRV (CATHETERS) IMPLANT
CLOSURE PERCLOSE PROSTYLE (Vascular Products) IMPLANT
COVER SWIFTLINK CONNECTOR (BAG) ×1 IMPLANT
DILATOR VESSEL 38 20CM 16FR (INTRODUCER) IMPLANT
GUIDEWIRE INQWIRE 1.5J.035X260 (WIRE) IMPLANT
KIT VERSACROSS CNCT FARADRIVE (KITS) IMPLANT
PACK EP LF (CUSTOM PROCEDURE TRAY) ×1 IMPLANT
PAD DEFIB RADIO PHYSIO CONN (PAD) ×1 IMPLANT
PATCH CARTO3 (PAD) IMPLANT
SHEATH FARADRIVE STEERABLE (SHEATH) IMPLANT
SHEATH PINNACLE 8F 10CM (SHEATH) IMPLANT
SHEATH PINNACLE 9F 10CM (SHEATH) IMPLANT
SHEATH PROBE COVER 6X72 (BAG) IMPLANT

## 2023-12-04 NOTE — Progress Notes (Signed)
 Patient received from Randi. Patient awake and alert, nods backs into sleep but easily aroused upon calling. DP palpated. Groin sites assessed and no hematoma notes, supple to touch.  Portable EKG done. Neuro assessment completed.  Vitals being monitored before heading back to short stay.  Patient complains of irritation in throat, gave some ice water and tolerated well, but still says her throat feels scratchy. Reassured patient that it is normal to have some discomfort or scratchiness due to intubation and extubation.

## 2023-12-04 NOTE — Anesthesia Preprocedure Evaluation (Signed)
 Anesthesia Evaluation  Patient identified by MRN, date of birth, ID band Patient awake    Reviewed: Allergy & Precautions, NPO status , Patient's Chart, lab work & pertinent test results  History of Anesthesia Complications Negative for: history of anesthetic complications  Airway Mallampati: I  TM Distance: >3 FB Neck ROM: Full    Dental  (+) Dental Advisory Given, Teeth Intact,    Pulmonary neg shortness of breath, neg sleep apnea, neg COPD, neg recent URI, PE   breath sounds clear to auscultation       Cardiovascular hypertension, Pt. on medications (-) angina + DVT  (-) Past MI + dysrhythmias Atrial Fibrillation  Rhythm:Regular  1. Left ventricular ejection fraction, by estimation, is 55 to 60%. The  left ventricle has normal function. The left ventricle has no regional  wall motion abnormalities. Left ventricular diastolic parameters were  normal.   2. Right ventricular systolic function is normal. The right ventricular  size is normal. There is normal pulmonary artery systolic pressure.   3. The mitral valve is normal in structure. No evidence of mitral valve  regurgitation. No evidence of mitral stenosis.   4. The aortic valve is tricuspid. Aortic valve regurgitation is not  visualized. No aortic stenosis is present.   5. The inferior vena cava is normal in size with greater than 50%  respiratory variability, suggesting right atrial pressure of 3 mmHg.   6. PACs and PVCs noted in study.     Neuro/Psych  Headaches, neg Seizures PSYCHIATRIC DISORDERS Anxiety Depression Bipolar Disorder      GI/Hepatic Neg liver ROS,GERD  Medicated and Controlled,,  Endo/Other  negative endocrine ROS    Renal/GU CRFRenal diseaseLab Results      Component                Value               Date                      NA                       141                 11/13/2023                K                        4.6                  11/13/2023                CO2                      22                  11/13/2023                GLUCOSE                  107 (H)             11/13/2023                BUN                      11  11/13/2023                CREATININE               0.98                11/13/2023                CALCIUM                   9.3                 11/13/2023                GFR                      57.71 (L)           09/04/2023                EGFR                     66                  11/13/2023                GFRNONAA                 >60                 08/15/2023                Musculoskeletal   Abdominal   Peds  Hematology  (+) Blood dyscrasia Lab Results      Component                Value               Date                      WBC                      5.6                 11/13/2023                HGB                      12.9                11/13/2023                HCT                      41.2                11/13/2023                MCV                      82                  11/13/2023                PLT                      182  11/13/2023             coumadin    Anesthesia Other Findings   Reproductive/Obstetrics                              Anesthesia Physical Anesthesia Plan  ASA: 2  Anesthesia Plan: General   Post-op Pain Management:    Induction: Intravenous  PONV Risk Score and Plan: 3 and Ondansetron , Dexamethasone , TIVA and Propofol  infusion  Airway Management Planned: Oral ETT  Additional Equipment: None  Intra-op Plan:   Post-operative Plan: Extubation in OR  Informed Consent: I have reviewed the patients History and Physical, chart, labs and discussed the procedure including the risks, benefits and alternatives for the proposed anesthesia with the patient or authorized representative who has indicated his/her understanding and acceptance.     Dental advisory given  Plan Discussed with:  CRNA  Anesthesia Plan Comments:          Anesthesia Quick Evaluation

## 2023-12-04 NOTE — Progress Notes (Signed)
 Patient ambulated in hallway, able to void. Tolerated well. Patient and Husband educated and reviewed discharge packet with them. No further questions at this point.

## 2023-12-04 NOTE — Anesthesia Procedure Notes (Signed)
 Procedure Name: Intubation Date/Time: 12/04/2023 12:31 PM  Performed by: Delores Dus, CRNAPre-anesthesia Checklist: Patient identified, Emergency Drugs available, Suction available and Patient being monitored Patient Re-evaluated:Patient Re-evaluated prior to induction Oxygen Delivery Method: Circle system utilized Preoxygenation: Pre-oxygenation with 100% oxygen Induction Type: IV induction Ventilation: Mask ventilation without difficulty Laryngoscope Size: Miller, 2, Glidescope and 3 Grade View: Grade II Tube type: Oral Tube size: 7.0 mm Number of attempts: 2 Airway Equipment and Method: Stylet and Oral airway Placement Confirmation: ETT inserted through vocal cords under direct vision, positive ETCO2 and breath sounds checked- equal and bilateral Secured at: 22 cm Tube secured with: Tape Dental Injury: Teeth and Oropharynx as per pre-operative assessment  Comments: Anterior view and floppy epiglottis, slight overbite

## 2023-12-04 NOTE — H&P (Signed)
 Electrophysiology Office Follow up Visit Note:     Date:  12/04/2023    ID:  Emily Rice, DOB [birth year], MRN [id number]   PCP:  Lendia Boby LITTIE, NP-C            [facility] Cardiologist:  Madonna Large, DO  [facility] Electrophysiologist:  OLE ONEIDA HOLTS, MD      Interval History:       Emily Rice is a 63 y.o. female who presents for a follow up visit.    She last saw Brandi in June 28, 2023.  The patient has a history of atrial fibrillation, hypertension, TBI, recurrent DVT, sickle cell trait, bipolar disorder.  She has a history of symptomatic atrial fibrillation and reported episodes 3-4 times per week at her most recent appointment with Brandi.  She previously expressed interest in pursuing catheter ablation.   I most recently saw the patient in July 2024.  She was previously scheduled for catheter ablation but this had to be canceled because of concern for a periodontal infection.   She was seen in the hospital 11/22/2023 for A-fib with RVR.  She was started on IV amiodarone  and transition to oral upon discharge.   She describes a migraine headache today.  She also describes more frequent episodes of atrial fibrillation that are quite symptomatic.  She feels some GI distress even when she is in A-fib.  She continues to take amiodarone  and Coumadin .  Her INRs have been therapeutic recently.  She is interested in pursuing catheter ablation.  Presents for aF ablation. Procedure reviewed.   Objective Past medical, surgical, social and family history were reviewed.   ROS:   Please see the history of present illness.    All other systems reviewed and are negative.   EKGs/Labs/Other Studies Reviewed:     The following studies were reviewed today:   10/22/2023 EKG shows atrial fibrillation with a ventricular rate of 91 bpm     EKG Interpretation Date/Time:                  Wednesday August 14 2023 13:05:05 EDT Ventricular Rate:         63 PR Interval:                  140 QRS Duration:             80 QT Interval:                 414 QTC Calculation:423 R Axis:                         69   Text Interpretation:Normal sinus rhythm Confirmed by HOLTS OLE [phone]) on 08/14/2023 1:14:19 PM     Physical Exam:     VS:  BP 138/80   Pulse 63   Ht 5' 3 (1.6 m)   Wt 168 lb (76.2 kg)   LMP  (LMP Unknown)   SpO2 93%   BMI 29.76 kg/m         Wt Readings from Last 3 Encounters:  08/14/23 168 lb (76.2 kg)  08/06/23 165 lb (74.8 kg)  07/23/23 169 lb 4.8 oz (76.8 kg)      GEN: no distress CARD: RRR, No MRG RESP: No IWOB. CTAB.     Assessment ASSESSMENT:     1. Paroxysmal atrial fibrillation (HCC)   2. History of DVT (deep vein thrombosis)     PLAN:  In order of problems listed above:   #Atrial fibrillation #High risk med monitoring-amiodarone  Paroxysmal.  Symptomatic.  Previously was scheduled for catheter ablation but this had to be canceled because of dental infection concerns.  She would like to proceed with this treatment strategy.  I discussed the catheter ablation procedure again during today's visit including the risks, recovery and likelihood of success and she wishes to proceed with scheduling.   Update CMP, TSH and free T4 today.   Discussed treatment options today for AF including antiarrhythmic drug therapy and ablation. Discussed risks, recovery and likelihood of success with each treatment strategy. Risk, benefits, and alternatives to EP study and ablation for afib were discussed. These risks include but are not limited to stroke, bleeding, vascular damage, tamponade, perforation, damage to the esophagus, lungs, phrenic nerve and other structures, pulmonary vein stenosis, worsening renal function, coronary vasospasm and death.  Discussed potential need for repeat ablation procedures and antiarrhythmic drugs after an initial ablation. The patient understands these risk and wishes to proceed.  We will therefore proceed with  catheter ablation at the next available time.  Carto, ICE, anesthesia are requested for the procedure.  Will also obtain CT PV protocol prior to the procedure to exclude LAA thrombus and further evaluate atrial anatomy.   #DVT history Continue OAC   Presents for AF ablation. Procedure reviewed. Awaiting INR result today.   Signed, Ole Holts, MD, [facility], [facility] 12/04/2023 Electrophysiology [facility]

## 2023-12-04 NOTE — Anesthesia Postprocedure Evaluation (Signed)
 Anesthesia Post Note  Patient: Emily Rice  Procedure(s) Performed: ATRIAL FIBRILLATION ABLATION     Patient location during evaluation: PACU Anesthesia Type: General Level of consciousness: awake and alert Pain management: pain level controlled Vital Signs Assessment: post-procedure vital signs reviewed and stable Respiratory status: spontaneous breathing, nonlabored ventilation and respiratory function stable Cardiovascular status: blood pressure returned to baseline Postop Assessment: no apparent nausea or vomiting Anesthetic complications: no   No notable events documented.  Last Vitals:  Vitals:   12/04/23 1426 12/04/23 1435  BP: 120/69 121/65  Pulse: (!) 47 (!) 46  Resp: 18 (!) 21  Temp: 37.1 C   SpO2: 99% 98%    Last Pain:  Vitals:   12/04/23 1426  TempSrc: Oral  PainSc: 0-No pain                 Vertell Row

## 2023-12-04 NOTE — Discharge Instructions (Signed)

## 2023-12-04 NOTE — Transfer of Care (Signed)
 Immediate Anesthesia Transfer of Care Note  Patient: Emily Rice  Procedure(s) Performed: ATRIAL FIBRILLATION ABLATION  Patient Location: PACU  Anesthesia Type:General  Level of Consciousness: awake, alert , and oriented  Airway & Oxygen Therapy: Patient Spontanous Breathing and Patient connected to nasal cannula oxygen  Post-op Assessment: Report given to RN and Post -op Vital signs reviewed and stable  Post vital signs: Reviewed and stable  Last Vitals:  Vitals Value Taken Time  BP 123/68 12/04/23 13:50  Temp 36.7 C 12/04/23 13:49  Pulse 49 12/04/23 13:52  Resp 19 12/04/23 13:52  SpO2 98 % 12/04/23 13:52  Vitals shown include unfiled device data.  Last Pain:  Vitals:   12/04/23 1349  TempSrc: Oral  PainSc: 0-No pain         Complications: No notable events documented.

## 2023-12-05 ENCOUNTER — Encounter (HOSPITAL_COMMUNITY): Payer: Self-pay | Admitting: Cardiology

## 2023-12-05 MED FILL — Fentanyl Citrate Preservative Free (PF) Inj 100 MCG/2ML: INTRAMUSCULAR | Qty: 1 | Status: AC

## 2023-12-06 ENCOUNTER — Other Ambulatory Visit: Payer: Self-pay | Admitting: Family Medicine

## 2023-12-06 ENCOUNTER — Telehealth: Payer: Self-pay

## 2023-12-06 DIAGNOSIS — Z7901 Long term (current) use of anticoagulants: Secondary | ICD-10-CM

## 2023-12-06 MED ORDER — LOSARTAN POTASSIUM 25 MG PO TABS: 25.0000 mg | ORAL_TABLET | Freq: Every day | ORAL | 3 refills | Status: DC

## 2023-12-06 MED ORDER — LOSARTAN POTASSIUM 25 MG PO TABS: 25.0000 mg | ORAL_TABLET | Freq: Every day | ORAL | 3 refills | Status: AC

## 2023-12-06 NOTE — Telephone Encounter (Signed)
 Pt reports ablation on 9/24 went well. Cardiology is needing a BMP in two weeks, 10/7, which is the same day she has a coumadin  clinic apt. She is requesting she go to [facility] for that lab and also have an INR drawn. She reports she normally goes to [facility] for cardiology. Advised this would be ok. Advised result will most likely not be back until the next day but that this nurse would f/u when result is received. Pt verbalized understanding.    Add lab INR order

## 2023-12-06 NOTE — Addendum Note (Signed)
 Addended by: GLADIS REENA GAILS on: 12/06/2023 04:22 PM   Modules accepted: Orders

## 2023-12-09 ENCOUNTER — Other Ambulatory Visit: Payer: Self-pay

## 2023-12-09 MED ORDER — RIZATRIPTAN BENZOATE 10 MG PO TBDP: 10.0000 mg | ORAL_TABLET | ORAL | 6 refills | Status: AC | PRN

## 2023-12-12 ENCOUNTER — Other Ambulatory Visit (HOSPITAL_COMMUNITY): Payer: Self-pay

## 2023-12-12 ENCOUNTER — Other Ambulatory Visit: Payer: Self-pay

## 2023-12-12 DIAGNOSIS — Z7901 Long term (current) use of anticoagulants: Secondary | ICD-10-CM

## 2023-12-16 ENCOUNTER — Other Ambulatory Visit (HOSPITAL_COMMUNITY): Payer: Self-pay

## 2023-12-16 ENCOUNTER — Telehealth: Payer: Self-pay

## 2023-12-16 NOTE — Telephone Encounter (Signed)
 LM for pt we do not have this information and it would be best to reach out and speak w someone at her insurance for further info

## 2023-12-16 NOTE — Telephone Encounter (Signed)
 Copied from CRM [id number]. Topic: General - Other >> Dec 16, 2023  9:14 AM Pinkey ORN wrote: Reason for CRM: Insurance Question >> Dec 16, 2023  9:16 AM Pinkey ORN wrote: Patient states she received a message from [facility] stating that her insurance will no longer be in network with [place] come next year and patient is wanting to confirm that information. Please follow up with patient at [phone]

## 2023-12-17 ENCOUNTER — Ambulatory Visit (INDEPENDENT_AMBULATORY_CARE_PROVIDER_SITE_OTHER): Payer: Self-pay

## 2023-12-17 ENCOUNTER — Telehealth: Payer: Self-pay

## 2023-12-17 ENCOUNTER — Other Ambulatory Visit: Payer: Self-pay | Admitting: *Deleted

## 2023-12-17 ENCOUNTER — Ambulatory Visit

## 2023-12-17 ENCOUNTER — Other Ambulatory Visit: Payer: Self-pay

## 2023-12-17 ENCOUNTER — Other Ambulatory Visit (HOSPITAL_COMMUNITY): Payer: Self-pay

## 2023-12-17 DIAGNOSIS — I10 Essential (primary) hypertension: Secondary | ICD-10-CM

## 2023-12-17 DIAGNOSIS — Z7901 Long term (current) use of anticoagulants: Secondary | ICD-10-CM | POA: Diagnosis not present

## 2023-12-17 LAB — PROTIME-INR
INR: 1.9 — ABNORMAL HIGH (ref 0.9–1.2)
Prothrombin Time: 21.1 s — ABNORMAL HIGH (ref 9.1–12.0)

## 2023-12-17 NOTE — Patient Instructions (Addendum)
 Pre visit review using our clinic review tool, if applicable. No additional management support is needed unless otherwise documented below in the visit note.  Change weekly dose to take 1 tablet (5 mg) daily except take 1 1/2 tablets (7.5 mg) on Sunday and Wednesday. Recheck in 3 week.

## 2023-12-17 NOTE — Telephone Encounter (Signed)
 Pt reports she is at [facility] and they are reporting there is no order for INR. They are reporting there is only an order for a PT. She reports she also had to get cardiology to print the BMP order because they could not see that order either.  Tried to contact [facility] on [employer] [address], per pt request, but there was no answer and VM was full.   Advised pt she can give the lab the direct number to the coumadin  clinic. Advised [facility] has consistently had difficulty seeing order in Epic. Advised there is something they have to do in their system to see the order. Although it does not make sense that they are seeing a PT and not the INR order because the order placed was for Protime-INR. This is the same order used for the last lab draw pt requested.   Pt will f/u with coumadin  clinic concerning testing.

## 2023-12-17 NOTE — Telephone Encounter (Signed)
 This has been taken care of. Documentation in other TE in the chart from today.

## 2023-12-17 NOTE — Telephone Encounter (Signed)
 Pt also contacted coumadin  clinic.  Contacted pt again and she is going to talk to them again and have them call the coumadin  clinic directly if anything further is needed.

## 2023-12-17 NOTE — Progress Notes (Addendum)
 Pt had INR lab draw at [facility] today.  Pt is no longer taking amiodarone .  Change weekly dose to take 1 tablet (5 mg) daily except take 1 1/2 tablets (7.5 mg) on Sunday and Wednesday. Recheck in 3 week. Contacted pt with dosing instructions. Pt reports she has not been taking 1 tablet daily. She has still been using the old dosing of 1 tablet daily except take 1 1/2 tablets on Sunday. She did not change the dosing after the INR of 3.8 about 1 month ago. Since pt has been taking this dose and she is still subtherapeutic the dosing calendar was updated to include 1 1/2 tablets on Sunday. Will make a small change to weekly dosing due to pt's last INR in coumadin  clinic being 1.9.  Will add one more day of 1 1/2 tablets to the weekly dosing. Advised pt of new dosing. Pt read back instructions. Made next coumadin  clinic apt. Pt verbalized understanding.   Medical screening examination/treatment/procedure(s) were performed by non-physician practitioner and as supervising physician I was immediately available for consultation/collaboration.  I agree with above. Emily Noel, MD

## 2023-12-17 NOTE — Telephone Encounter (Signed)
 Copied from CRM [id number]. Topic: Clinical - Request for Lab/Test Order >> Dec 17, 2023 10:58 AM Burnard DEL wrote: Reason for CRM: Patient called in regarding PT INR orders not being sent to [facility] the correct way. I called [facility] and was on hold for a while. I tried to inform patient that I was still on hold. Patient hung up. Tried to contact patient ,no answer.

## 2023-12-17 NOTE — Telephone Encounter (Signed)
 Copied from CRM [id number]. Topic: Clinical - Request for Lab/Test Order >> Dec 17, 2023  9:58 AM Rea BROCKS wrote: Reason for CRM: Patient is at [facility] and they dont see the order for the PT-INR. Called [facility]. They said they will fix the order. Patient is currently at the lab.   [phone] (M)

## 2023-12-17 NOTE — Telephone Encounter (Signed)
 Pt called again reporting [facility] is still only seeing the PT and not the INR order. Advised it is all on one order.   She placed a [facility] tech on the phone at the lab she was at and they could not figure out why they were only seeing a PT. Advised to get a Production designer, theatre/television/film to help. She requested help from her manager and they said their order does say PT, but anytime they get a PT order they also complete an INR. The order just looks different in their system.   Manager reported pt can have lab draw there for PT/INR today. Advised pt this nurse will contact her when result is received. Pt verbalized understanding.

## 2023-12-17 NOTE — Telephone Encounter (Signed)
*  [facility]  Pharmacy Patient Advocate Encounter   Received notification from CoverMyMeds that prior authorization for Rizatriptan  Benzoate 10MG  dispersible tablets  is required/requested.   Insurance verification completed.   The patient is insured through [employer].   Per test claim: Refill too soon. PA is not needed at this time. Medication was filled 09/22. Next eligible fill date is N/A.   *quantity limits of 12 tablets per 23 days

## 2023-12-18 ENCOUNTER — Ambulatory Visit: Payer: Self-pay | Admitting: Family Medicine

## 2023-12-18 LAB — BASIC METABOLIC PANEL WITH GFR
BUN/Creatinine Ratio: 14 (ref 12–28)
BUN: 15 mg/dL (ref 8–27)
CO2: 23 mmol/L (ref 20–29)
Calcium: 9.6 mg/dL (ref 8.7–10.3)
Chloride: 101 mmol/L (ref 96–106)
Creatinine, Ser: 1.09 mg/dL — ABNORMAL HIGH (ref 0.57–1.00)
Glucose: 72 mg/dL (ref 70–99)
Potassium: 4.4 mmol/L (ref 3.5–5.2)
Sodium: 141 mmol/L (ref 134–144)
eGFR: 58 mL/min/1.73 — ABNORMAL LOW (ref >59)

## 2023-12-18 NOTE — Progress Notes (Signed)
 FYI

## 2023-12-19 ENCOUNTER — Other Ambulatory Visit: Payer: Self-pay | Admitting: Family Medicine

## 2023-12-19 DIAGNOSIS — Z7901 Long term (current) use of anticoagulants: Secondary | ICD-10-CM

## 2023-12-19 NOTE — Telephone Encounter (Signed)
 Pt is compliant with warfarin management and PCP apts.  Sent in refill of warfarin to requested pharmacy.

## 2023-12-20 ENCOUNTER — Telehealth: Payer: Self-pay | Admitting: Cardiology

## 2023-12-20 ENCOUNTER — Ambulatory Visit: Payer: Self-pay

## 2023-12-20 NOTE — Telephone Encounter (Signed)
 Pt has appt scheduled.
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
## 2023-12-20 NOTE — Telephone Encounter (Signed)
 Spoke with pt regarding her symptoms. Pt had ablation 9/24 and stated she has been having pelvic pain and bloating since about 3 days after ablation. Pt stated it feels like menstrual cramps. Pt was asked if she had had a bowel movement since the ablation. Pt stated she has and is not constipated. Pt was told that her message would be sent to Dr. Cindie but that she should follow up with her PCP. Pt verbalized understanding. All questions if any were answered.

## 2023-12-20 NOTE — Telephone Encounter (Signed)
 Pt is having pelvic pain, bloating on and off  since she had ablation but yesterday was really bad.

## 2023-12-23 ENCOUNTER — Ambulatory Visit: Admitting: Family Medicine

## 2023-12-23 NOTE — Telephone Encounter (Signed)
 Left message for patient advising of Dr. Hiram recommendation. It looks like she has reached out to her PCP. Advised to call back with any questions.

## 2023-12-28 ENCOUNTER — Other Ambulatory Visit: Payer: Self-pay | Admitting: Adult Health

## 2023-12-28 DIAGNOSIS — F411 Generalized anxiety disorder: Secondary | ICD-10-CM

## 2023-12-30 ENCOUNTER — Telehealth: Payer: Self-pay

## 2023-12-30 NOTE — Telephone Encounter (Signed)
 Copied from CRM #[id number]. Topic: Clinical - Medication Question >> Dec 30, 2023 12:47 PM Alfonso HERO wrote: Reason for CRM: Pam from [facility] called because they got a script for omeprazole  (PRILOSEC) 40 MG capsule but they see a script for pantoprazole  (PROTONIX ) 40 MG tablet was filled at a local pharmacy and wanted to know if that's what the patient should be taking instead of the omeprazole . If so please send a new script to [facility] at [phone].

## 2023-12-30 NOTE — Telephone Encounter (Signed)
 Hospital prescribed Protonix  and you prescribe Prilosec, which do you prefer pt take?

## 2024-01-01 ENCOUNTER — Ambulatory Visit (HOSPITAL_COMMUNITY)
Admission: RE | Admit: 2024-01-01 | Discharge: 2024-01-01 | Disposition: A | Discharge status: Home / Self Care | Source: Ambulatory Visit | Attending: Physician Assistant | Admitting: Physician Assistant

## 2024-01-01 VITALS — BP 130/72 | HR 53 | Ht 60.0 in | Wt 173.2 lb

## 2024-01-01 DIAGNOSIS — I480 Paroxysmal atrial fibrillation: Secondary | ICD-10-CM

## 2024-01-01 DIAGNOSIS — I4891 Unspecified atrial fibrillation: Secondary | ICD-10-CM

## 2024-01-01 NOTE — Progress Notes (Signed)
 Primary Care Physician: Lendia Boby CROME, NP-C Primary Cardiologist: Madonna Large, DO Electrophysiologist: OLE ONEIDA HOLTS, MD  Referring Physician: Dr HOLTS Wilbert CROME Emily Rice is a 63 y.o. female with a history of HTN sickle cell trait, bipolar disorder, traumatic brain injury/SAH, atrial fibrillation who presents for follow up in the [facility].  The patient is currently on amiodarone  for rhythm control and is scheduled for an ablation on 12/04/23. Patient is on warfarin for stroke prevention. Her diltiazem  was decreased 08/2023 due to bradycardia. She wore a 2 week cardiac monitor which showed 12% afib burden. She is s/p afib ablation with Dr HOLTS on 12/04/23. Her amiodarone  was discontinued at the time of ablation.    Patient returns for follow up for atrial fibrillation. She reports that she has done well since the ablation. She had one brief episode of afib the morning after but has been in SR since. She denies chest pain or groin issues. No bleeding issues on anticoagulation.   Today, she  denies symptoms of palpitations, chest pain, shortness of breath, orthopnea, PND, lower extremity edema, dizziness, presyncope, syncope, snoring, daytime somnolence, bleeding, or neurologic sequela. The patient is tolerating medications without difficulties and is otherwise without complaint today.    Atrial Fibrillation Risk Factors:  she does not have symptoms or diagnosis of sleep apnea. she does not have a history of rheumatic fever.   Atrial Fibrillation Management history:  Previous antiarrhythmic drugs: amiodarone   Previous cardioversions: none Previous ablations: 12/04/23 Anticoagulation history: warfarin   ROS- All systems are reviewed and negative except as per the HPI above.  Past Medical History:  Diagnosis Date   A-fib [facility])    Allergy    Anxiety    Bipolar disorder (HCC)    Chondromalacia patellae 12/14/2003   Depression    DVT (deep vein  thrombosis) in pregnancy    DVT (deep venous thrombosis) (HCC) 2001 and 2002   DVT (deep venous thrombosis) (HCC)    x 2   Enlarged pancreas 07/02/2023   Fulness of pancreatic head on CT    Esophagitis 06/06/2016   GERD (gastroesophageal reflux disease)    Hemorrhage    Hyperlipidemia    Migraines    Pulmonary embolism (HCC) 2001   S/P cholecystectomy 09/11/2012   Sickle cell anemia (HCC)    Suicidal ideation 12/28/2008   Tremor     Current Outpatient Medications  Medication Sig Dispense Refill   acetaminophen  (TYLENOL ) 325 MG tablet Take 2 tablets (650 mg total) by mouth every 6 (six) hours as needed for mild pain or moderate pain. (Patient taking differently: Take 650 mg by mouth as needed for mild pain (pain score 1-3) or moderate pain (pain score 4-6).)     acetaminophen  (TYLENOL ) 650 MG CR tablet Take 1,300 mg by mouth every 8 (eight) hours as needed for pain. (Patient taking differently: Take 1,300 mg by mouth as needed for pain.)     busPIRone  (BUSPAR ) 30 MG tablet Take 1 tablet (30 mg total) by mouth 2 (two) times daily. 180 tablet 0   calcium  carbonate (TUMS) 500 MG chewable tablet Chew 1 tablet (200 mg of elemental calcium  total) by mouth daily. 90 tablet 3   cetirizine (ZYRTEC) 10 MG tablet Take 10 mg by mouth daily as needed for allergies. (Patient taking differently: Take 10 mg by mouth as needed for allergies.)     Cholecalciferol  75 MCG (3000 UT) TABS Take 3,000 Units by mouth daily. 30 tablet  0   clonazePAM  (KLONOPIN ) 0.5 MG tablet TAKE 1 TABLET TWICE DAILY 60 tablet 0   diltiazem  (CARDIZEM ) 30 MG tablet TAKE 1 TABLET EVERY 4 HOURS AS NEEDED FOR AFIB HEART RATE >95 AS LONG AS TOP BP >100. 90 tablet 1   escitalopram  (LEXAPRO ) 10 MG tablet TAKE 1 TABLET AT BEDTIME 90 tablet 0   fexofenadine (ALLEGRA) 180 MG tablet Take 180 mg by mouth as needed for allergies or rhinitis.     fluticasone  (FLONASE ) 50 MCG/ACT nasal spray Place 1 spray into both nostrils daily as needed for  allergies.      ibuprofen (ADVIL) 200 MG tablet Take 800 mg by mouth as needed.     lamoTRIgine  (LAMICTAL ) 200 MG tablet TAKE 1 TABLET TWICE DAILY 180 tablet 0   losartan  (COZAAR ) 25 MG tablet Take 1 tablet (25 mg total) by mouth daily. 90 tablet 3   losartan  (COZAAR ) 25 MG tablet Take 1 tablet (25 mg total) by mouth daily. 30 tablet 3   lumateperone  tosylate (CAPLYTA ) 42 MG capsule Take 1 capsule (42 mg total) by mouth daily. 90 capsule 0   metFORMIN  (GLUCOPHAGE -XR) 500 MG 24 hr tablet Take 1 tablet (500 mg total) by mouth 2 (two) times daily with a meal. 180 tablet 0   Multiple Vitamin (MULTIVITAMIN WITH MINERALS) TABS tablet Take 1 tablet by mouth daily.     ondansetron  (ZOFRAN -ODT) 4 MG disintegrating tablet Take 1 tablet (4 mg total) by mouth every 8 (eight) hours as needed. 20 tablet 6   pantoprazole  (PROTONIX ) 40 MG tablet Take 1 tablet (40 mg total) by mouth daily. 45 tablet 0   polyethylene glycol (MIRALAX  / GLYCOLAX ) 17 g packet Take 17 g by mouth 2 (two) times daily. (Patient taking differently: Take 17 g by mouth as needed for moderate constipation.) 14 each 0   potassium chloride  SA (KLOR-CON  M) 20 MEQ tablet Take 1 tablet (20 mEq total) by mouth daily. 90 tablet 3   QUEtiapine  (SEROQUEL ) 50 MG tablet TAKE 1 TO 2 TABLETS AT BEDTIME 180 tablet 0   rizatriptan  (MAXALT -MLT) 10 MG disintegrating tablet Take 1 tablet (10 mg total) by mouth as needed. May repeat in 2 hours if needed 10 tablet 6   warfarin (COUMADIN ) 5 MG tablet TAKE 1 TABLET (5 MG) BY MOUTH DAILY EXCEPT TAKE 1 AND 1/2 TABLETS (7.5 MG) ON WEDNESD AND SUNDAY OR AS DIRECTED BY ANTICOAGULATION CLINIC 110 tablet 1   omeprazole  (PRILOSEC) 40 MG capsule Take 1 capsule (40 mg total) by mouth daily. (Patient not taking: Reported on 01/01/2024) 100 capsule 1   No current facility-administered medications for this encounter.    Physical Exam: BP 130/72   Pulse (!) 53   Ht 5' (1.524 m)   Wt 78.6 kg   LMP  (LMP Unknown)   BMI  33.83 kg/m   GEN: Well nourished, well developed in no acute distress CARDIAC: Regular rate and rhythm, no murmurs, rubs, gallops RESPIRATORY:  Clear to auscultation without rales, wheezing or rhonchi  ABDOMEN: Soft, non-tender, non-distended EXTREMITIES:  No edema; No deformity    Wt Readings from Last 3 Encounters:  01/01/24 78.6 kg  12/04/23 76.2 kg  12/03/23 77.1 kg     EKG today demonstrates  SB, NST Vent. rate 53 BPM PR interval 138 ms QRS duration 82 ms QT/QTcB 440/412 ms   Echo 07/23/23 demonstrated   1. Left ventricular ejection fraction, by estimation, is 55 to 60%. The  left ventricle has normal function. The  left ventricle has no regional  wall motion abnormalities. Left ventricular diastolic parameters were  normal.   2. Right ventricular systolic function is normal. The right ventricular  size is normal. There is normal pulmonary artery systolic pressure.   3. The mitral valve is normal in structure. No evidence of mitral valve  regurgitation. No evidence of mitral stenosis.   4. The aortic valve is tricuspid. Aortic valve regurgitation is not  visualized. No aortic stenosis is present.   5. The inferior vena cava is normal in size with greater than 50%  respiratory variability, suggesting right atrial pressure of 3 mmHg.   6. PACs and PVCs noted in study.    CHA2DS2-VASc Score = 2  The patient's score is based upon: CHF History: 0 HTN History: 1 Diabetes History: 0 Stroke History: 0 Vascular Disease History: 0 Age Score: 0 Gender Score: 1       ASSESSMENT AND PLAN: Paroxysmal Atrial Fibrillation (ICD10:  I48.0) The patient's CHA2DS2-VASc score is 2, indicating a 2.2% annual risk of stroke.   S/p afib ablation 12/04/23, now off amiodarone . Patient appears to be maintaining SR Continue warfarin  HTN Stable on current regimen    Follow up with Daphne Barrack as scheduled.      [facility] [address] [place],  [place] [address] [phone]

## 2024-01-02 ENCOUNTER — Encounter: Payer: Self-pay | Admitting: Family Medicine

## 2024-01-02 ENCOUNTER — Telehealth: Payer: Self-pay

## 2024-01-02 ENCOUNTER — Ambulatory Visit (INDEPENDENT_AMBULATORY_CARE_PROVIDER_SITE_OTHER): Admitting: Family Medicine

## 2024-01-02 VITALS — BP 126/82 | HR 52 | Temp 97.6°F | Ht 60.0 in | Wt 172.0 lb

## 2024-01-02 DIAGNOSIS — Z7901 Long term (current) use of anticoagulants: Secondary | ICD-10-CM | POA: Diagnosis not present

## 2024-01-02 DIAGNOSIS — L659 Nonscarring hair loss, unspecified: Secondary | ICD-10-CM

## 2024-01-02 DIAGNOSIS — R10A1 Flank pain, right side: Secondary | ICD-10-CM

## 2024-01-02 DIAGNOSIS — R1024 Suprapubic pain: Secondary | ICD-10-CM

## 2024-01-02 DIAGNOSIS — Z86718 Personal history of other venous thrombosis and embolism: Secondary | ICD-10-CM

## 2024-01-02 DIAGNOSIS — R3915 Urgency of urination: Secondary | ICD-10-CM | POA: Diagnosis not present

## 2024-01-02 DIAGNOSIS — R300 Dysuria: Secondary | ICD-10-CM | POA: Diagnosis not present

## 2024-01-02 DIAGNOSIS — Z86711 Personal history of pulmonary embolism: Secondary | ICD-10-CM

## 2024-01-02 DIAGNOSIS — R102 Pelvic and perineal pain unspecified side: Secondary | ICD-10-CM

## 2024-01-02 DIAGNOSIS — R7303 Prediabetes: Secondary | ICD-10-CM

## 2024-01-02 LAB — CBC WITH DIFFERENTIAL/PLATELET
Basophils Absolute: 0 K/uL (ref 0.0–0.1)
Basophils Relative: 0.7 % (ref 0.0–3.0)
Eosinophils Absolute: 0.2 K/uL (ref 0.0–0.7)
Eosinophils Relative: 3.1 % (ref 0.0–5.0)
HCT: 41.1 % (ref 36.0–46.0)
Hemoglobin: 13 g/dL (ref 12.0–15.0)
Lymphocytes Relative: 37.4 % (ref 12.0–46.0)
Lymphs Abs: 2.2 K/uL (ref 0.7–4.0)
MCHC: 31.6 g/dL (ref 30.0–36.0)
MCV: 81.5 fl (ref 78.0–100.0)
Monocytes Absolute: 0.4 K/uL (ref 0.1–1.0)
Monocytes Relative: 6.1 % (ref 3.0–12.0)
Neutro Abs: 3.1 K/uL (ref 1.4–7.7)
Neutrophils Relative %: 52.7 % (ref 43.0–77.0)
Platelets: 175 K/uL (ref 150.0–400.0)
RBC: 5.04 Mil/uL (ref 3.87–5.11)
RDW: 17.7 % — ABNORMAL HIGH (ref 11.5–15.5)
WBC: 5.8 K/uL (ref 4.0–10.5)

## 2024-01-02 LAB — FERRITIN: Ferritin: 18.9 ng/mL (ref 10.0–291.0)

## 2024-01-02 LAB — POC URINALSYSI DIPSTICK (AUTOMATED)
Bilirubin, UA: NEGATIVE
Blood, UA: NEGATIVE
Glucose, UA: NEGATIVE
Ketones, UA: NEGATIVE
Leukocytes, UA: NEGATIVE
Nitrite, UA: NEGATIVE
Protein, UA: NEGATIVE
Spec Grav, UA: 1.015 (ref 1.010–1.025)
Urobilinogen, UA: 0.2 U/dL
pH, UA: 6 (ref 5.0–8.0)

## 2024-01-02 LAB — COMPREHENSIVE METABOLIC PANEL WITH GFR
ALT: 44 U/L — ABNORMAL HIGH (ref 0–35)
AST: 41 U/L — ABNORMAL HIGH (ref 0–37)
Albumin: 4.5 g/dL (ref 3.5–5.2)
Alkaline Phosphatase: 52 U/L (ref 39–117)
BUN: 19 mg/dL (ref 6–23)
CO2: 29 meq/L (ref 19–32)
Calcium: 9.3 mg/dL (ref 8.4–10.5)
Chloride: 104 meq/L (ref 96–112)
Creatinine, Ser: 1.12 mg/dL (ref 0.40–1.20)
GFR: 53.28 mL/min — ABNORMAL LOW (ref >60.00)
Glucose, Bld: 84 mg/dL (ref 70–99)
Potassium: 4.3 meq/L (ref 3.5–5.1)
Sodium: 142 meq/L (ref 135–145)
Total Bilirubin: 0.5 mg/dL (ref 0.2–1.2)
Total Protein: 7.4 g/dL (ref 6.0–8.3)

## 2024-01-02 LAB — TSH: TSH: 5.66 u[IU]/mL — ABNORMAL HIGH (ref 0.35–5.50)

## 2024-01-02 LAB — VITAMIN B12: Vitamin B-12: 556 pg/mL (ref 211–911)

## 2024-01-02 LAB — HEMOGLOBIN A1C: Hgb A1c MFr Bld: 5.8 % (ref 4.6–6.5)

## 2024-01-02 LAB — FOLATE: Folate: >22.9 ng/mL (ref >5.9)

## 2024-01-02 MED ORDER — PANTOPRAZOLE SODIUM 40 MG PO TBEC: 40.0000 mg | DELAYED_RELEASE_TABLET | Freq: Every day | ORAL | 1 refills | Status: AC

## 2024-01-02 NOTE — Patient Instructions (Signed)
 Please go downstairs for labs before you leave.  I am attempting to get a pelvic ultrasound done for you today.  You should receive a call to get this scheduled.  We will be in touch with your urine culture result  See OB/GYN on Monday as scheduled  If your pain becomes more severe or if you develop any worsening or new symptoms such as fever, chills, vomiting, then please go to the emergency department

## 2024-01-02 NOTE — Telephone Encounter (Signed)
 Copied from CRM [id number]. Topic: Referral - Question >> Jan 02, 2024  3:20 PM Ashley R wrote:  Reason for CRM: Ultrasound ordered to - [facility] has nothing available today/tomorrow. Asking if there are any other locations this can be sent to.

## 2024-01-02 NOTE — Progress Notes (Addendum)
 "  Subjective:     Patient ID: Emily Rice, female    DOB: 1960/09/21, 63 y.o.   MRN: [id number]  Chief Complaint  Patient presents with   Acute Visit    Noticing her eyebrow hairs have been falling out for the last 2 weeks.   Dysuria, urinary frequency and urgency. Right flank pain, taking 800 motrin 2x daily but hasn't really helped.     HPI  Discussed the use of AI scribe software for clinical note transcription with the patient, who gave verbal consent to proceed.  History of Present Illness Emily Rice is a 63 year old female with a history of cardiac ablation who presents with lower abdominal pain and urinary symptoms.  Lower abdominal and flank pain - Significant lower abdominal pain and pressure, primarily in the suprapubic region - Pain intensity rated 7 to 8 out of 10 - Onset a couple of weeks after cardiac ablation on September 25th - Associated right flank pain intermittent  - Pain relieved with 800 mg Motrin  Urinary tract symptoms - Dysuria - Increased urgency and frequency of urination - Symptoms worsening over the past couple of weeks - No fever, chills, or urinary incontinence  Anticoagulation therapy - Currently on warfarin for deep vein thrombosis - Previously trialed Eliquis  but developed a DVT while on it, necessitating return to Coumadin   Bowel habit changes - Change in bowel habits over the past six months - Stools are softer and mushy - Sensation of incomplete evacuation - No constipation or diarrhea  Dermatologic changes - Thinning of eyebrows and hair  Gynecological symptoms - Vaginal dryness - Sexual activity 2 weeks ago  - No dyspareunia - No pelvic exam in the past year - Scheduled for gynecological exam on Monday     Health Maintenance Due  Topic Date Due   Cervical Cancer Screening (HPV/Pap Cotest)  Never done   Pneumococcal Vaccine: 50+ Years (3 of 3 - PCV) 09/03/2013   Influenza Vaccine  10/11/2023   COVID-19  Vaccine (4 - 2025-26 season) 11/11/2023    Past Medical History:  Diagnosis Date   A-fib [facility])    Allergy    Anxiety    Bipolar disorder (HCC)    Chondromalacia patellae 12/14/2003   Depression    DVT (deep vein thrombosis) in pregnancy    DVT (deep venous thrombosis) (HCC) 2001 and 2002   DVT (deep venous thrombosis) (HCC)    x 2   Enlarged pancreas 07/02/2023   Fulness of pancreatic head on CT    Esophagitis 06/06/2016   GERD (gastroesophageal reflux disease)    Hemorrhage    Hyperlipidemia    Migraines    Pulmonary embolism (HCC) 2001   S/P cholecystectomy 09/11/2012   Sickle cell anemia (HCC)    Suicidal ideation 12/28/2008   Tremor     Past Surgical History:  Procedure Laterality Date   ATRIAL FIBRILLATION ABLATION N/A 12/04/2023   Procedure: ATRIAL FIBRILLATION ABLATION;  Surgeon: Cindie Ole DASEN, MD;  Location: [facility] INVASIVE CV LAB;  Service: Cardiovascular;  Laterality: N/A;   CHOLECYSTECTOMY  2014   CHOLECYSTECTOMY     HUMERUS IM NAIL Right 03/03/2020   Procedure: INTRAMEDULLARY (IM) NAIL HUMERAL;  Surgeon: Kendal Franky SQUIBB, MD;  Location: [facility] OR;  Service: Orthopedics;  Laterality: Right;   OVARIAN CYST REMOVAL     TONSILLECTOMY      Family History  Problem Relation Age of Onset   Hypertension Mother    Stroke Father  Hypertension Father    Atrial fibrillation Father    Heart disease Father    Diabetes Sister    CAD Brother    Anxiety disorder Daughter    ADD / ADHD Son    Depression Son    Hypertension Maternal Uncle    Liver disease Neg Hx    Esophageal cancer Neg Hx    Colon cancer Neg Hx     Social History   Socioeconomic History   Marital status: Married    Spouse name: Marine Scientist   Number of children: 2   Years of education: college   Highest education level: Master's degree (e.g., MA, MS, MEng, MEd, MSW, MBA)  Occupational History   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   Occupation: retired  Tobacco Use   Smoking status: Never    Smokeless tobacco: Never   Tobacco comments:    Never smoked 09/20/23  Vaping Use   Vaping status: Never Used  Substance and Sexual Activity   Alcohol use: Never    Alcohol/week: 2.0 standard drinks of alcohol    Types: 2 Standard drinks or equivalent per week    Comment: occasional   Drug use: Not Currently    Frequency: 7.0 times per week    Types: Benzodiazepines   Sexual activity: Yes    Birth control/protection: Post-menopausal  Other Topics Concern   Not on file  Social History Narrative   ** Merged History Encounter ** Lives with husband.   Right-handed.   No daily use of caffeine .   Lives with husband.-2025      Social Drivers of Health   Financial Resource Strain: Low Risk  (12/21/2023)   Overall Financial Resource Strain (CARDIA)    Difficulty of Paying Living Expenses: Not very hard  Food Insecurity: Food Insecurity Present (12/21/2023)   Hunger Vital Sign    Worried About Running Out of Food in the Last Year: Never true    Ran Out of Food in the Last Year: Sometimes true  Transportation Needs: No Transportation Needs (12/21/2023)   PRAPARE - Administrator, Civil Service (Medical): No    Lack of Transportation (Non-Medical): No  Physical Activity: Insufficiently Active (12/21/2023)   Exercise Vital Sign    Days of Exercise per Week: 4 days    Minutes of Exercise per Session: 30 min  Stress: No Stress Concern Present (12/21/2023)   [employer] of Occupational Health - Occupational Stress Questionnaire    Feeling of Stress: Only a little  Social Connections: Moderately Integrated (12/21/2023)   Social Connection and Isolation Panel    Frequency of Communication with Friends and Family: More than three times a week    Frequency of Social Gatherings with Friends and Family: Never    Attends Religious Services: More than 4 times per year    Active Member of [employer] or Organizations: No    Attends Engineer, Structural: Not on file     Marital Status: Married  Catering Manager Violence: Not At Risk (07/24/2023)   Humiliation, Afraid, Rape, and Kick questionnaire    Fear of Current or Ex-Partner: No    Emotionally Abused: No    Physically Abused: No    Sexually Abused: No    Outpatient Medications Prior to Visit  Medication Sig Dispense Refill   acetaminophen  (TYLENOL ) 325 MG tablet Take 2 tablets (650 mg total) by mouth every 6 (six) hours as needed for mild pain or moderate pain. (Patient taking differently: Take 650 mg  by mouth as needed for mild pain (pain score 1-3) or moderate pain (pain score 4-6).)     acetaminophen  (TYLENOL ) 650 MG CR tablet Take 1,300 mg by mouth every 8 (eight) hours as needed for pain. (Patient taking differently: Take 1,300 mg by mouth as needed for pain.)     busPIRone  (BUSPAR ) 30 MG tablet Take 1 tablet (30 mg total) by mouth 2 (two) times daily. 180 tablet 0   calcium  carbonate (TUMS) 500 MG chewable tablet Chew 1 tablet (200 mg of elemental calcium  total) by mouth daily. 90 tablet 3   cetirizine (ZYRTEC) 10 MG tablet Take 10 mg by mouth daily as needed for allergies. (Patient taking differently: Take 10 mg by mouth as needed for allergies.)     Cholecalciferol  75 MCG (3000 UT) TABS Take 3,000 Units by mouth daily. 30 tablet 0   clonazePAM  (KLONOPIN ) 0.5 MG tablet TAKE 1 TABLET TWICE DAILY 60 tablet 0   diltiazem  (CARDIZEM ) 30 MG tablet TAKE 1 TABLET EVERY 4 HOURS AS NEEDED FOR AFIB HEART RATE >95 AS LONG AS TOP BP >100. 90 tablet 1   escitalopram  (LEXAPRO ) 10 MG tablet TAKE 1 TABLET AT BEDTIME 90 tablet 0   fexofenadine (ALLEGRA) 180 MG tablet Take 180 mg by mouth as needed for allergies or rhinitis.     fluticasone  (FLONASE ) 50 MCG/ACT nasal spray Place 1 spray into both nostrils daily as needed for allergies.      ibuprofen (ADVIL) 200 MG tablet Take 800 mg by mouth as needed.     lamoTRIgine  (LAMICTAL ) 200 MG tablet TAKE 1 TABLET TWICE DAILY 180 tablet 0   losartan  (COZAAR ) 25 MG tablet  Take 1 tablet (25 mg total) by mouth daily. 90 tablet 3   losartan  (COZAAR ) 25 MG tablet Take 1 tablet (25 mg total) by mouth daily. 30 tablet 3   lumateperone  tosylate (CAPLYTA ) 42 MG capsule Take 1 capsule (42 mg total) by mouth daily. 90 capsule 0   metFORMIN  (GLUCOPHAGE -XR) 500 MG 24 hr tablet Take 1 tablet (500 mg total) by mouth 2 (two) times daily with a meal. 180 tablet 0   Multiple Vitamin (MULTIVITAMIN WITH MINERALS) TABS tablet Take 1 tablet by mouth daily.     ondansetron  (ZOFRAN -ODT) 4 MG disintegrating tablet Take 1 tablet (4 mg total) by mouth every 8 (eight) hours as needed. 20 tablet 6   polyethylene glycol (MIRALAX  / GLYCOLAX ) 17 g packet Take 17 g by mouth 2 (two) times daily. (Patient taking differently: Take 17 g by mouth as needed for moderate constipation.) 14 each 0   potassium chloride  SA (KLOR-CON  M) 20 MEQ tablet Take 1 tablet (20 mEq total) by mouth daily. 90 tablet 3   QUEtiapine  (SEROQUEL ) 50 MG tablet TAKE 1 TO 2 TABLETS AT BEDTIME 180 tablet 0   rizatriptan  (MAXALT -MLT) 10 MG disintegrating tablet Take 1 tablet (10 mg total) by mouth as needed. May repeat in 2 hours if needed 10 tablet 6   warfarin (COUMADIN ) 5 MG tablet TAKE 1 TABLET (5 MG) BY MOUTH DAILY EXCEPT TAKE 1 AND 1/2 TABLETS (7.5 MG) ON WEDNESD AND SUNDAY OR AS DIRECTED BY ANTICOAGULATION CLINIC 110 tablet 1   omeprazole  (PRILOSEC) 40 MG capsule Take 1 capsule (40 mg total) by mouth daily. (Patient not taking: Reported on 01/01/2024) 100 capsule 1   pantoprazole  (PROTONIX ) 40 MG tablet Take 1 tablet (40 mg total) by mouth daily. 45 tablet 0   No facility-administered medications prior to visit.  Allergies  Allergen Reactions   Hydromorphone  Other (See Comments)    Stroke-like symptoms. treated with Narcan; Tolerated Percocet in the past.   Iodinated Contrast Media Other (See Comments)    Reaction not fully known   Prochlorperazine Swelling and Other (See Comments)    Compazine - tongue swelling     Review of Systems  Constitutional:  Negative for chills and fever.  Respiratory:  Negative for shortness of breath.   Cardiovascular:  Negative for chest pain, palpitations and leg swelling.  Gastrointestinal:  Positive for diarrhea. Negative for abdominal pain, constipation, nausea and vomiting.       Loose stool  Genitourinary:  Positive for dysuria, frequency and urgency.  Musculoskeletal:  Positive for back pain and myalgias.  Neurological:  Negative for dizziness, focal weakness and headaches.       Objective:    Physical Exam Constitutional:      General: She is not in acute distress.    Appearance: She is not ill-appearing.  HENT:     Mouth/Throat:     Mouth: Mucous membranes are moist.     Pharynx: Oropharynx is clear.  Eyes:     Extraocular Movements: Extraocular movements intact.     Conjunctiva/sclera: Conjunctivae normal.  Cardiovascular:     Rate and Rhythm: Normal rate and regular rhythm.  Pulmonary:     Effort: Pulmonary effort is normal.     Breath sounds: Normal breath sounds.  Abdominal:     General: Abdomen is flat. Bowel sounds are normal.     Palpations: Abdomen is soft.     Tenderness: There is abdominal tenderness in the right lower quadrant and suprapubic area. There is no right CVA tenderness, left CVA tenderness, guarding or rebound. Negative signs include Murphy's sign and McBurney's sign.  Musculoskeletal:        General: Normal range of motion.     Cervical back: Normal range of motion and neck supple.  Skin:    General: Skin is warm and dry.  Neurological:     General: No focal deficit present.     Mental Status: She is alert and oriented to person, place, and time.     Motor: No weakness.     Coordination: Coordination normal.     Gait: Gait normal.  Psychiatric:        Mood and Affect: Mood normal.        Behavior: Behavior normal.        Thought Content: Thought content normal.      BP 126/82   Pulse (!) 52   Temp 97.6 F  (36.4 C) (Temporal)   Ht 5' (1.524 m)   Wt 172 lb (78 kg)   LMP  (LMP Unknown)   SpO2 99%   BMI 33.59 kg/m  Wt Readings from Last 3 Encounters:  01/02/24 172 lb (78 kg)  01/01/24 173 lb 3.2 oz (78.6 kg)  12/04/23 168 lb (76.2 kg)       Assessment & Plan:   Problem List Items Addressed This Visit     Dysuria   Relevant Orders   POCT Urinalysis Dipstick (Automated) (Completed)   Urine Culture   History of DVT (deep vein thrombosis)   History of pulmonary embolism   Long term current use of anticoagulant therapy   Relevant Orders   CBC with Differential/Platelet (Completed)   Comprehensive metabolic panel with GFR (Completed)   Prediabetes   Relevant Orders   CBC with Differential/Platelet (Completed)   Comprehensive metabolic panel  with GFR (Completed)   TSH (Completed)   Hemoglobin A1c (Completed)   Other Visit Diagnoses       Pelvic pain    -  Primary   Relevant Orders   CBC with Differential/Platelet (Completed)   Comprehensive metabolic panel with GFR (Completed)   US  PELVIC COMPLETE WITH TRANSVAGINAL     Suprapubic pressure       Relevant Orders   US  PELVIC COMPLETE WITH TRANSVAGINAL     Urinary urgency         Right flank pain         Hair thinning       Relevant Orders   CBC with Differential/Platelet (Completed)   Comprehensive metabolic panel with GFR (Completed)   Ferritin (Completed)   Zinc    Vitamin B12 (Completed)   Folate (Completed)       Assessment and Plan Assessment & Plan Pelvic pain and dysuria Pelvic pain and dysuria ongoing for three weeks, with suprapubic and right flank pain, burning sensation during urination, and hematuria. Urinalysis dipstick negative for infection, urine culture pending. Differential includes UTI and possible gynecological issues. No fever or chills. Pain severity varies from 2 to 7 out of 10. Recent cardiac ablation noted, and gynecological exam scheduled for Monday. - Send urine for culture - Prefers to wait  for urine culture results before starting antibiotics - Instruct to notify if symptoms worsen - Advise to use acetaminophen  instead of NSAIDs due to warfarin use - Consider pelvic ultrasound ordered today.   Thinning eyebrows and hair loss Thinning eyebrows and hair loss with no recent medication changes or known deficiencies. Possible causes include autoimmune conditions, medication side effects, or nutritional deficiencies such as zinc . No signs of infection or skin irritation. - Check zinc  levels - Consider biotin supplementation if appropriate  Altered bowel habits Altered bowel habits with soft, mushy stools for the past six months, more noticeable recently. No constipation or diarrhea, but incomplete evacuation sensation present.  Pre-diabetes - Check A1c today   History of DVT and PE on warfarin DVT and PE, currently on warfarin. Previous attempts to switch to Eliquis  resulted in another DVT. Continues on warfarin due to history of multiple DVTs. - Continue warfarin therapy - Avoid NSAIDs due to warfarin use     I have discontinued Emily Rice's omeprazole . I am also having her start on cephALEXin . Additionally, I am having her maintain her fluticasone , fexofenadine, acetaminophen , Cholecalciferol , multivitamin with minerals, polyethylene glycol, cetirizine, calcium  carbonate, potassium chloride  SA, diltiazem , lamoTRIgine , ondansetron , escitalopram , busPIRone , QUEtiapine , lumateperone  tosylate, acetaminophen , metFORMIN , losartan , losartan , rizatriptan , warfarin, clonazePAM , ibuprofen, and pantoprazole .  Meds ordered this encounter  Medications   pantoprazole  (PROTONIX ) 40 MG tablet    Sig: Take 1 tablet (40 mg total) by mouth daily.    Dispense:  90 tablet    Refill:  1   cephALEXin  (KEFLEX ) 500 MG capsule    Sig: Take 1 capsule (500 mg total) by mouth 2 (two) times daily.    Dispense:  10 capsule    Refill:  0    Supervising Provider:   ROLLENE NORRIS A [[id number]]     "

## 2024-01-02 NOTE — Telephone Encounter (Signed)
 Pt only taking protonix , med list updated

## 2024-01-03 ENCOUNTER — Telehealth: Payer: Self-pay

## 2024-01-03 ENCOUNTER — Ambulatory Visit: Payer: Self-pay | Admitting: Family Medicine

## 2024-01-03 ENCOUNTER — Other Ambulatory Visit: Payer: Self-pay | Admitting: Family Medicine

## 2024-01-03 ENCOUNTER — Ambulatory Visit: Payer: Medicare PPO | Admitting: Hematology and Oncology

## 2024-01-03 DIAGNOSIS — R748 Abnormal levels of other serum enzymes: Secondary | ICD-10-CM

## 2024-01-03 DIAGNOSIS — R944 Abnormal results of kidney function studies: Secondary | ICD-10-CM

## 2024-01-03 DIAGNOSIS — R7989 Other specified abnormal findings of blood chemistry: Secondary | ICD-10-CM

## 2024-01-03 LAB — URINE CULTURE

## 2024-01-03 MED ORDER — CEPHALEXIN 500 MG PO CAPS: 500.0000 mg | ORAL_CAPSULE | Freq: Two times a day (BID) | ORAL | 0 refills | Status: DC

## 2024-01-03 NOTE — Telephone Encounter (Signed)
 Copied from CRM [id number]. Topic: Clinical - Medication Question >> Jan 03, 2024  8:52 AM Viola F wrote: Reason for CRM: Patient seen Emily Rice yesterday and wants to let her know that she would like the antibiotic that they discussed, sent to the [id number] - [place], [place] - [address]  She requested a call to let her know when it's sent to the pharmacy because she is in pain

## 2024-01-03 NOTE — Addendum Note (Signed)
 Addended by: Hanna Ra L on: 01/03/2024 12:04 PM   Modules accepted: Orders

## 2024-01-03 NOTE — Telephone Encounter (Signed)
 Order changed.

## 2024-01-03 NOTE — Progress Notes (Signed)
 Please ask her to follow-up for lab visit in 4 weeks to recheck her thyroid  functionl and liver enzymes.  These labs are slightly above normal range and need to be reassessed.  No other concerns.  Her A1c is stable at 5.8%.  I am still waiting for urine culture and zinc  level.

## 2024-01-03 NOTE — Telephone Encounter (Signed)
 Called pt and informed her rx was sent in

## 2024-01-06 ENCOUNTER — Ambulatory Visit (INDEPENDENT_AMBULATORY_CARE_PROVIDER_SITE_OTHER): Admitting: Internal Medicine

## 2024-01-06 ENCOUNTER — Other Ambulatory Visit (HOSPITAL_COMMUNITY)
Admission: RE | Admit: 2024-01-06 | Discharge: 2024-01-06 | Disposition: A | Discharge status: Home / Self Care | Source: Ambulatory Visit | Attending: Obstetrics and Gynecology | Admitting: Obstetrics and Gynecology

## 2024-01-06 ENCOUNTER — Encounter: Payer: Self-pay | Admitting: Obstetrics and Gynecology

## 2024-01-06 ENCOUNTER — Ambulatory Visit: Admitting: Obstetrics and Gynecology

## 2024-01-06 VITALS — BP 135/79 | HR 54 | Ht 63.0 in | Wt 173.0 lb

## 2024-01-06 DIAGNOSIS — Z780 Asymptomatic menopausal state: Secondary | ICD-10-CM | POA: Insufficient documentation

## 2024-01-06 DIAGNOSIS — Z1151 Encounter for screening for human papillomavirus (HPV): Secondary | ICD-10-CM | POA: Insufficient documentation

## 2024-01-06 DIAGNOSIS — Z01419 Encounter for gynecological examination (general) (routine) without abnormal findings: Secondary | ICD-10-CM

## 2024-01-06 DIAGNOSIS — N950 Postmenopausal bleeding: Secondary | ICD-10-CM

## 2024-01-06 LAB — ZINC: Zinc: 92 ug/dL (ref 60–130)

## 2024-01-06 NOTE — Progress Notes (Signed)
 Subjective:     Emily Rice is a 63 y.o. female P2 postmenopausal with BMI 30 who is here for a comprehensive physical exam. The patient reports intermittent episodes of vaginal bleeding which she describes as spotting and noted when wiping. This has been ongoing since 2023. Patient was planning a cardiac ablation and opted to defer the D&C. She had the ablation a month ago and is ready to proceed. Patient is on lifelong anticoagulation secondary to unprovoked DVT and PE. Patient also reports significant vaginal dryness with intercourse. She has tried water-based vaginal lubricant and did not like it. Patient is without any other complaints. She denies urinary incontinence, pelvic pain or abnormal discharge  Past Medical History:  Diagnosis Date   A-fib (HCC)    Allergy    Anxiety    Bipolar disorder (HCC)    Chondromalacia patellae 12/14/2003   Depression    DVT (deep vein thrombosis) in pregnancy    DVT (deep venous thrombosis) (HCC) 2001 and 2002   DVT (deep venous thrombosis) (HCC)    x 2   Enlarged pancreas 07/02/2023   Fulness of pancreatic head on CT    Esophagitis 06/06/2016   GERD (gastroesophageal reflux disease)    Hemorrhage    Hyperlipidemia    Migraines    Pulmonary embolism (HCC) 2001   S/P cholecystectomy 09/11/2012   Sickle cell anemia (HCC)    Suicidal ideation 12/28/2008   Tremor    Past Surgical History:  Procedure Laterality Date   ATRIAL FIBRILLATION ABLATION N/A 12/04/2023   Procedure: ATRIAL FIBRILLATION ABLATION;  Surgeon: Cindie Ole DASEN, MD;  Location: [facility];  Service: Cardiovascular;  Laterality: N/A;   CHOLECYSTECTOMY  2014   CHOLECYSTECTOMY     HUMERUS IM NAIL Right 03/03/2020   Procedure: INTRAMEDULLARY (IM) NAIL HUMERAL;  Surgeon: Kendal Franky SQUIBB, MD;  Location: [facility] OR;  Service: Orthopedics;  Laterality: Right;   OVARIAN CYST REMOVAL     TONSILLECTOMY     Family History  Problem Relation Age of Onset   Hypertension Mother     Stroke Father    Hypertension Father    Atrial fibrillation Father    Heart disease Father    Diabetes Sister    CAD Brother    Anxiety disorder Daughter    ADD / ADHD Son    Depression Son    Hypertension Maternal Uncle    Liver disease Neg Hx    Esophageal cancer Neg Hx    Colon cancer Neg Hx     Social History   Socioeconomic History   Marital status: Married    Spouse name: Marine Scientist   Number of children: 2   Years of education: college   Highest education level: Master's degree (e.g., MA, MS, MEng, MEd, MSW, MBA)  Occupational History   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   Occupation: retired  Tobacco Use   Smoking status: Never   Smokeless tobacco: Never   Tobacco comments:    Never smoked 09/20/23  Vaping Use   Vaping status: Never Used  Substance and Sexual Activity   Alcohol use: Never    Alcohol/week: 2.0 standard drinks of alcohol    Types: 2 Standard drinks or equivalent per week    Comment: occasional   Drug use: Not Currently    Frequency: 7.0 times per week    Types: Benzodiazepines   Sexual activity: Yes    Birth control/protection: Post-menopausal  Other Topics Concern   Not on  file  Social History Narrative   ** Merged History Encounter ** Lives with husband.   Right-handed.   No daily use of caffeine .   Lives with husband.-2025      Social Drivers of Health   Financial Resource Strain: Low Risk  (12/21/2023)   Overall Financial Resource Strain (CARDIA)    Difficulty of Paying Living Expenses: Not very hard  Food Insecurity: Food Insecurity Present (12/21/2023)   Hunger Vital Sign    Worried About Running Out of Food in the Last Year: Never true    Ran Out of Food in the Last Year: Sometimes true  Transportation Needs: No Transportation Needs (12/21/2023)   PRAPARE - Administrator, Civil Service (Medical): No    Lack of Transportation (Non-Medical): No  Physical Activity: Insufficiently Active (12/21/2023)   Exercise Vital  Sign    Days of Exercise per Week: 4 days    Minutes of Exercise per Session: 30 min  Stress: No Stress Concern Present (12/21/2023)   [employer] of Occupational Health - Occupational Stress Questionnaire    Feeling of Stress: Only a little  Social Connections: Moderately Integrated (12/21/2023)   Social Connection and Isolation Panel    Frequency of Communication with Friends and Family: More than three times a week    Frequency of Social Gatherings with Friends and Family: Never    Attends Religious Services: More than 4 times per year    Active Member of [employer] or Organizations: No    Attends Banker Meetings: Not on file    Marital Status: Married  Catering Manager Violence: Not At Risk (07/24/2023)   Humiliation, Afraid, Rape, and Kick questionnaire    Fear of Current or Ex-Partner: No    Emotionally Abused: No    Physically Abused: No    Sexually Abused: No   Health Maintenance  Topic Date Due   Cervical Cancer Screening (HPV/Pap Cotest)  Never done   Pneumococcal Vaccine: 50+ Years (3 of 3 - PCV) 09/03/2013   Influenza Vaccine  10/11/2023   COVID-19 Vaccine (4 - 2025-26 season) 11/11/2023   Medicare Annual Wellness (AWV)  03/28/2024   Mammogram  02/05/2025   Colonoscopy  06/30/2026   DTaP/Tdap/Td (3 - Td or Tdap) 03/02/2030   Hepatitis B Vaccines 19-59 Average Risk  Completed   Hepatitis C Screening  Completed   HIV Screening  Completed   HPV VACCINES  Aged Out   Meningococcal B Vaccine  Aged Out   Zoster Vaccines- Shingrix  Discontinued       Review of Systems Pertinent items noted in HPI and remainder of comprehensive ROS otherwise negative.   Objective:    GENERAL: Well-developed, well-nourished female in no acute distress.  HEENT: Normocephalic, atraumatic. Sclerae anicteric.  NECK: Supple. Normal thyroid .  LUNGS: Clear to auscultation bilaterally.  HEART: Regular rate and rhythm. BREASTS: Symmetric in size. No palpable masses or  lymphadenopathy, skin changes, or nipple drainage. ABDOMEN: Soft, nontender, nondistended. No organomegaly. PELVIC: Normal external female genitalia. Vagina is pale and atrophic.  Normal discharge. Normal appearing cervix. Uterus is normal in size. No adnexal mass or tenderness. Chaperone present during the pelvic exam EXTREMITIES: No cyanosis, clubbing, or edema, 2+ distal pulses.     Assessment:    Healthy female exam.      Plan:    Pap smear collected Screening mammogram ordered Patient is current on colonoscopy Patient will be contacted with abnormal results Pelvic ultrasound ordered Patient will be scheduled for D&C  with hysteroscopy See After Visit Summary for Counseling Recommendations

## 2024-01-06 NOTE — Progress Notes (Signed)
 Pt is new to office, needing routine exam.  Pt states her last gyn provider was going to possibly have an EMB under anesthesia -  pt never had done as she had other health issues.  Pt recently had cardiac ablation a month ago.  Pt complains of vaginal dryness, getting worse over time.

## 2024-01-07 ENCOUNTER — Other Ambulatory Visit (INDEPENDENT_AMBULATORY_CARE_PROVIDER_SITE_OTHER): Payer: Self-pay | Admitting: Internal Medicine

## 2024-01-07 ENCOUNTER — Encounter (INDEPENDENT_AMBULATORY_CARE_PROVIDER_SITE_OTHER): Payer: Self-pay | Admitting: Internal Medicine

## 2024-01-07 ENCOUNTER — Ambulatory Visit (INDEPENDENT_AMBULATORY_CARE_PROVIDER_SITE_OTHER)

## 2024-01-07 ENCOUNTER — Ambulatory Visit: Payer: Self-pay | Admitting: Obstetrics and Gynecology

## 2024-01-07 ENCOUNTER — Ambulatory Visit (INDEPENDENT_AMBULATORY_CARE_PROVIDER_SITE_OTHER): Admitting: Internal Medicine

## 2024-01-07 VITALS — BP 133/86 | HR 71 | Temp 98.0°F | Ht 63.0 in | Wt 169.0 lb

## 2024-01-07 DIAGNOSIS — Z6829 Body mass index (BMI) 29.0-29.9, adult: Secondary | ICD-10-CM

## 2024-01-07 DIAGNOSIS — R7401 Elevation of levels of liver transaminase levels: Secondary | ICD-10-CM | POA: Diagnosis not present

## 2024-01-07 DIAGNOSIS — R7303 Prediabetes: Secondary | ICD-10-CM

## 2024-01-07 DIAGNOSIS — R944 Abnormal results of kidney function studies: Secondary | ICD-10-CM

## 2024-01-07 DIAGNOSIS — E66811 Obesity, class 1: Secondary | ICD-10-CM

## 2024-01-07 DIAGNOSIS — Z7901 Long term (current) use of anticoagulants: Secondary | ICD-10-CM

## 2024-01-07 DIAGNOSIS — R638 Other symptoms and signs concerning food and fluid intake: Secondary | ICD-10-CM

## 2024-01-07 LAB — POCT INR: INR: 2.4 (ref 2.0–3.0)

## 2024-01-07 LAB — CYTOLOGY - PAP
Comment: NEGATIVE
Comment: NEGATIVE
Comment: NEGATIVE
Diagnosis: NEGATIVE
HPV 16: NEGATIVE
HPV 18 / 45: NEGATIVE
High risk HPV: POSITIVE — AB

## 2024-01-07 MED ORDER — LIRAGLUTIDE -WEIGHT MANAGEMENT 18 MG/3ML ~~LOC~~ SOPN: PEN_INJECTOR | SUBCUTANEOUS | 0 refills | Status: DC

## 2024-01-07 NOTE — Progress Notes (Signed)
 Office: [phone]  /  Fax: [phone]  Weight Summary and Body Composition Analysis (BIA)  Vitals Temp: 98 F (36.7 C) BP: 133/86 Pulse Rate: 71   Anthropometric Measurements Height: 5' 3 (1.6 m) Weight: 169 lb (76.7 kg) BMI (Calculated): 29.94 Weight at Last Visit: 170 lb Weight Lost Since Last Visit: 1 lb Weight Gained Since Last Visit: 0 lb Starting Weight: 179 lb Total Weight Loss (lbs): 10 lb (4.536 kg) Peak Weight: 203 lb   Body Composition  Body Fat %: 40.2 % Fat Mass (lbs): 68 lbs Muscle Mass (lbs): 96.2 lbs Total Body Water (lbs): 68.6 lbs Visceral Fat Rating : 10    No data recorded Today's Visit #: 9  Starting Date: 03/14/23   Subjective   Chief Complaint: Obesity  Interval History Discussed the use of AI scribe software for clinical note transcription with the Emily Rice, who gave verbal consent to proceed.  History of Present Illness Emily Rice is a 63 year old female who presents for obesity and weight management.  Emily Rice underwent an ablation procedure on September 25th, which was successful. Post-procedure, Emily Rice experienced suprapubic pain, for which Emily Rice has been taking 800 mg of Motrin. Emily Rice had a single episode of atrial fibrillation since the procedure.  Emily Rice current medications include Lexapro  at bedtime, Lamictal , Caplyta , Seroquel , Metformin  for diabetes prevention, Klonopin , and Buspirone . Emily Rice is no longer taking Neurontin .  Emily Rice is maintaining a daily caloric intake of 1000 calories and practices intermittent fasting for 16 hours, two to three days a week. Emily Rice incorporates protein shakes into Emily Rice diet and uses a scale and tracking apps to monitor Emily Rice caloric intake.  Emily Rice has difficulty maintaining Emily Rice weight loss, unable to break into the 170s despite reaching 165 lbs in May. Emily Rice weight has plateaued over the last three to four months. Emily Rice currently walks three times a week for half an hour and focuses on maintaining Emily Rice protein  intake and portion control.  Emily Rice has a history of elevated liver enzymes and concerns about Emily Rice kidney function, with a GFR noted to be below 60. Recent lab results have shown changes in Emily Rice liver and kidney function.     Challenges affecting Emily Rice progress: presence of obesogenic drugs, slow metabolism for age, and menopause.    Pharmacotherapy for weight management: Emily Rice is currently taking Metformin  (off label use for weight management and / or insulin  resistance and / or diabetes prevention) with adequate clinical response  and without side effects..   Assessment and Plan   Treatment Plan For Obesity:  Recommended Dietary Goals  Emily Rice is currently in the action stage of change. As such, Emily Rice goal is to continue weight management plan. Emily Rice has agreed to: continue current plan  Behavioral Health and Counseling  We discussed the following behavioral modification strategies today: continue to work on maintaining a reduced calorie state, getting the recommended amount of protein, incorporating whole foods, making healthy choices, staying well hydrated and practicing mindfulness when eating. and increase protein intake, fibrous foods (25 grams per day for women, 30 grams for men) and water to improve satiety and decrease hunger signals. .  Additional education and resources provided today: None  Recommended Physical Activity Goals  Emily Rice has been advised to work up to 150 minutes of moderate intensity aerobic activity a week and strengthening exercises 2-3 times per week for cardiovascular health, weight loss maintenance and preservation of muscle mass.  Emily Rice has agreed to :  Think about enjoyable ways to increase daily  physical activity and overcoming barriers to exercise, Increase physical activity in their day and reduce sedentary time (increase NEAT)., Increase volume of physical activity to a goal of 240 minutes a week, and Combine aerobic and strengthening exercises for efficiency  and improved cardiometabolic health.  Medical Interventions and Pharmacotherapy  We discussed various medication options to help Emily Rice with Emily Rice weight loss efforts and we both agreed to : We discussed various medication options to help Emily Rice with Emily Rice weight loss efforts and we both agreed to : start anti-obesity medication.  In addition to reduced calorie nutrition plan (RCNP), behavioral strategies and physical activity, Emily Rice would benefit from pharmacotherapy to assist with hunger signals, satiety and cravings. This will reduce obesity-related health risks by inducing weight loss, and help reduce food consumption and adherence to [facility]). It may also improve QOL by improving self-confidence and reduce the setbacks associated with metabolic adaptations.  He also has high risk comorbidities associated with Emily Rice obesity including: History of DVT, atrial fibrillation, hypertension, chronic kidney disease stage III AA, weight gain associated with psychotropic medication, prediabetes.  All of these conditions increase Emily Rice risk for future complications and are either improved or potentially reverse through sustained weight loss.  GLP-1 receptor agonists have been shown in robust clinical trials to: Enhance satiety and delay gastric emptying, resulting in reduced caloric intake Improve insulin  sensitivity and glycemic control Promote clinically meaningful weight loss (>=5-22%) Reduce cardiovascular risk markers, including blood pressure, lipids, and inflammation Improved sleep apnea and associated complications by helping Emily Rice potentially lose more than 15% of total body weight.  This is usually what is required to reduce AHI.  Given Emily Rice's clinical profile--GLP-1 therapy is both indicated and expected to provide multifactorial benefit. The medication's mechanism aligns precisely with the Emily Rice's needs and addresses the physiologic underpinnings of weight gain.   After a detailed discussion covering  treatment rationale, mechanism of action, expected outcomes, risks, and long-term use considerations, shared decision-making was used to initiate liraglutide as Emily Rice does not have coverage for semaglutide  or tirzepatide. The importance of ongoing lifestyle management and long-term adherence was emphasized, as was the potential for weight regain following discontinuation.  Ongoing monitoring and follow-up are planned to assess tolerability, clinical response, and reinforce behavioral strategies.   Associated Conditions Impacted by Obesity Treatment  Assessment & Plan Class 1 obesity with serious comorbidity and body mass index (BMI) of 31.0 to 31.9 in adult, unspecified obesity type Abnormal food appetite Weight: decrease of 16.4 lb (8.7%) over 1 year, 3 months  Start: 09/20/2022 189 lb 6.4 oz (85.9 kg)  End: 01/06/2024 173 lb (78.5 kg)  Obesity and weight management Obesity management is ongoing with a slight weight decrease of one pound despite reduced physical activity post-ablation. Current BMI is 29, down from 32. Medications may contribute to weight gain, complicating management. Intermittent fasting and calorie tracking are in use. Meal replacements are being considered to manage calorie intake. Liraglutide (Victoza) is under consideration for weight management, pending insurance approval. Liraglutide is a GLP-1 receptor agonist that aids in weight loss but requires daily injections and may not be covered by insurance unless specific conditions are met. - Continue metformin  1000 mg for diabetes prevention and weight management. - Incorporate one meal replacement (protein shake) daily to manage calorie intake. - Maintain daily calorie intake around 1000 calories. - Submit prescription for liraglutide (Victoza) to test insurance coverage. - If insurance covers liraglutide, schedule appointment to teach injection technique. Prediabetes Most recent A1c is  Lab Results  Component Value Date    HGBA1C 5.8 01/02/2024   HGBA1C 5.8 (H) 07/31/2021    Emily Rice aware of disease state and risk of progression. This may contribute to abnormal cravings, fatigue and diabetic complications without having diabetes.   Advised to maintain a diet low on simple and processed carbohydrates.  Continue metformin  XR 500 mg twice a day for pharmacoprophylaxis and weight management Started liraglutide for pharmacoprophylaxis. Transaminitis I reviewed Emily Rice history Emily Rice had mild elevations in AST ALT sometime last year this apparently improved with some medication changes Emily Rice is not really clear what Emily Rice was taking at the time.  Emily Rice denies any alcohol risk factors for viral hepatitis.  I also reviewed abdominal imaging has been completed and there is no signs of fatty liver.  Emily Rice most recent liver enzymes are again elevated.  Because of time restriction today this will be further addressed at the next office visit. Decreased GFR On recent labs Emily Rice would like to discuss this further due to time restrictions again Emily Rice will be scheduled in a few weeks to address elevated liver enzymes and decreased GFR.  These labs were ordered by another provider but has a few unanswered questions these will be addressed to the best of our ability at the next office visit.          Objective   Physical Exam:  Blood pressure 133/86, pulse 71, temperature 98 F (36.7 C), height 5' 3 (1.6 m), weight 169 lb (76.7 kg). Body mass index is 29.94 kg/m.  General: Emily Rice is overweight, cooperative, alert, well developed, and in no acute distress. PSYCH: Has normal mood, affect and thought process.   HEENT: EOMI, sclerae are anicteric. Lungs: Normal breathing effort, no conversational dyspnea. Extremities: No edema.  Neurologic: No gross sensory or motor deficits. No tremors or fasciculations noted.    Diagnostic Data Reviewed:  BMET    Component Value Date/Time   NA 142 01/02/2024 0946   NA 141 12/17/2023 1042   K  4.3 01/02/2024 0946   CL 104 01/02/2024 0946   CO2 29 01/02/2024 0946   GLUCOSE 84 01/02/2024 0946   BUN 19 01/02/2024 0946   BUN 15 12/17/2023 1042   CREATININE 1.12 01/02/2024 0946   CALCIUM  9.3 01/02/2024 0946   GFRNONAA >60 08/15/2023 1432   GFRAA >60 12/10/2019 1600   Lab Results  Component Value Date   HGBA1C 5.8 01/02/2024   HGBA1C 5.8 (H) 07/31/2021   Lab Results  Component Value Date   INSULIN  8.7 03/14/2023   Lab Results  Component Value Date   TSH 5.66 (H) 01/02/2024   CBC    Component Value Date/Time   WBC 5.8 01/02/2024 0946   RBC 5.04 01/02/2024 0946   HGB 13.0 01/02/2024 0946   HGB 12.9 11/13/2023 1037   HCT 41.1 01/02/2024 0946   HCT 41.2 11/13/2023 1037   PLT 175.0 01/02/2024 0946   PLT 182 11/13/2023 1037   MCV 81.5 01/02/2024 0946   MCV 82 11/13/2023 1037   MCH 25.7 (L) 11/13/2023 1037   MCH 26.9 08/15/2023 1432   MCHC 31.6 01/02/2024 0946   RDW 17.7 (H) 01/02/2024 0946   RDW 15.9 (H) 11/13/2023 1037   Iron Studies    Component Value Date/Time   FERRITIN 18.9 01/02/2024 0946   Lipid Panel     Component Value Date/Time   CHOL 132 07/25/2023 0502   CHOL 193 02/27/2021 1013   TRIG 76 07/25/2023 0502   HDL 56 07/25/2023 0502  HDL 60 02/27/2021 1013   CHOLHDL 2.4 07/25/2023 0502   VLDL 15 07/25/2023 0502   LDLCALC 61 07/25/2023 0502   LDLCALC 115 (H) 02/27/2021 1013   LDLDIRECT 111 (H) 02/27/2021 1013   Hepatic Function Panel     Component Value Date/Time   PROT 7.4 01/02/2024 0946   PROT 7.2 10/01/2023 1058   ALBUMIN  4.5 01/02/2024 0946   ALBUMIN  4.4 10/01/2023 1058   AST 41 (H) 01/02/2024 0946   ALT 44 (H) 01/02/2024 0946   ALKPHOS 52 01/02/2024 0946   BILITOT 0.5 01/02/2024 0946   BILITOT 0.3 10/01/2023 1058   BILIDIR <0.1 07/23/2023 0528   IBILI NOT CALCULATED 07/23/2023 0528      Component Value Date/Time   TSH 5.66 (H) 01/02/2024 0946   Nutritional Lab Results  Component Value Date   VD25OH 56.84 10/17/2023    VD25OH 54.08 07/26/2023   VD25OH 59.4 03/14/2023    Medications: Outpatient Encounter Medications as of 01/07/2024  Medication Sig Note   acetaminophen  (TYLENOL ) 325 MG tablet Take 2 tablets (650 mg total) by mouth every 6 (six) hours as needed for mild pain or moderate pain. (Emily Rice taking differently: Take 650 mg by mouth as needed for mild pain (pain score 1-3) or moderate pain (pain score 4-6).)    acetaminophen  (TYLENOL ) 650 MG CR tablet Take 1,300 mg by mouth every 8 (eight) hours as needed for pain. (Emily Rice taking differently: Take 1,300 mg by mouth as needed for pain.)    busPIRone  (BUSPAR ) 30 MG tablet Take 1 tablet (30 mg total) by mouth 2 (two) times daily.    calcium  carbonate (TUMS) 500 MG chewable tablet Chew 1 tablet (200 mg of elemental calcium  total) by mouth daily.    cephALEXin (KEFLEX) 500 MG capsule Take 1 capsule (500 mg total) by mouth 2 (two) times daily.    cetirizine (ZYRTEC) 10 MG tablet Take 10 mg by mouth daily as needed for allergies. (Emily Rice taking differently: Take 10 mg by mouth as needed for allergies.)    Cholecalciferol  75 MCG (3000 UT) TABS Take 3,000 Units by mouth daily.    clonazePAM  (KLONOPIN ) 0.5 MG tablet TAKE 1 TABLET TWICE DAILY    diltiazem  (CARDIZEM ) 30 MG tablet TAKE 1 TABLET EVERY 4 HOURS AS NEEDED FOR AFIB HEART RATE >95 AS LONG AS TOP BP >100.    escitalopram  (LEXAPRO ) 10 MG tablet TAKE 1 TABLET AT BEDTIME    fexofenadine (ALLEGRA) 180 MG tablet Take 180 mg by mouth as needed for allergies or rhinitis.    fluticasone  (FLONASE ) 50 MCG/ACT nasal spray Place 1 spray into both nostrils daily as needed for allergies.     ibuprofen (ADVIL) 200 MG tablet Take 800 mg by mouth as needed.    lamoTRIgine  (LAMICTAL ) 200 MG tablet TAKE 1 TABLET TWICE DAILY    losartan  (COZAAR ) 25 MG tablet Take 1 tablet (25 mg total) by mouth daily.    losartan  (COZAAR ) 25 MG tablet Take 1 tablet (25 mg total) by mouth daily.    lumateperone  tosylate (CAPLYTA ) 42 MG  capsule Take 1 capsule (42 mg total) by mouth daily.    metFORMIN  (GLUCOPHAGE -XR) 500 MG 24 hr tablet Take 1 tablet (500 mg total) by mouth 2 (two) times daily with a meal.    Multiple Vitamin (MULTIVITAMIN WITH MINERALS) TABS tablet Take 1 tablet by mouth daily.    ondansetron  (ZOFRAN -ODT) 4 MG disintegrating tablet Take 1 tablet (4 mg total) by mouth every 8 (eight) hours as needed. 12/02/2023:  Has not received yet   pantoprazole  (PROTONIX ) 40 MG tablet Take 1 tablet (40 mg total) by mouth daily.    polyethylene glycol (MIRALAX  / GLYCOLAX ) 17 g packet Take 17 g by mouth 2 (two) times daily. (Emily Rice taking differently: Take 17 g by mouth as needed for moderate constipation.)    potassium chloride  SA (KLOR-CON  M) 20 MEQ tablet Take 1 tablet (20 mEq total) by mouth daily.    QUEtiapine  (SEROQUEL ) 50 MG tablet TAKE 1 TO 2 TABLETS AT BEDTIME    rizatriptan  (MAXALT -MLT) 10 MG disintegrating tablet Take 1 tablet (10 mg total) by mouth as needed. May repeat in 2 hours if needed    warfarin (COUMADIN ) 5 MG tablet TAKE 1 TABLET (5 MG) BY MOUTH DAILY EXCEPT TAKE 1 AND 1/2 TABLETS (7.5 MG) ON WEDNESD AND SUNDAY OR AS DIRECTED BY ANTICOAGULATION CLINIC    [DISCONTINUED] Liraglutide -Weight Management 18 MG/3ML SOPN Inject 0.6 mg into the skin daily for 7 days, THEN 1.2 mg daily for 7 days, THEN 1.8 mg daily for 14 days.    No facility-administered encounter medications on file as of 01/07/2024.     Follow-Up   Return in about 3 weeks (around 01/28/2024) for For liver anzymes and kidney fucntion.SABRA Emily Rice was informed of the importance of frequent follow up visits to maximize Emily Rice success with intensive lifestyle modifications for Emily Rice multiple health conditions.  Attestation Statement   Reviewed by clinician on day of visit: allergies, medications, problem list, medical history, surgical history, family history, social history, and previous encounter notes.     Lucas Parker, MD

## 2024-01-07 NOTE — Assessment & Plan Note (Signed)
 Weight: decrease of 16.4 lb (8.7%) over 1 year, 3 months  Start: 09/20/2022 189 lb 6.4 oz (85.9 kg)  End: 01/06/2024 173 lb (78.5 kg)  Obesity and weight management Obesity management is ongoing with a slight weight decrease of one pound despite reduced physical activity post-ablation. Current BMI is 29, down from 32. Medications may contribute to weight gain, complicating management. Intermittent fasting and calorie tracking are in use. Meal replacements are being considered to manage calorie intake. Liraglutide (Victoza) is under consideration for weight management, pending insurance approval. Liraglutide is a GLP-1 receptor agonist that aids in weight loss but requires daily injections and may not be covered by insurance unless specific conditions are met. - Continue metformin  1000 mg for diabetes prevention and weight management. - Incorporate one meal replacement (protein shake) daily to manage calorie intake. - Maintain daily calorie intake around 1000 calories. - Submit prescription for liraglutide (Victoza) to test insurance coverage. - If insurance covers liraglutide, schedule appointment to teach injection technique.

## 2024-01-07 NOTE — Patient Instructions (Addendum)
 Pre visit review using our clinic review tool, if applicable. No additional management support is needed unless otherwise documented below in the visit note.  Continue 1 tablet (5 mg) daily except take 1 1/2 tablets (7.5 mg) on Sunday and Wednesday. Recheck in 5 week.

## 2024-01-07 NOTE — Assessment & Plan Note (Signed)
 I reviewed her history she had mild elevations in AST ALT sometime last year this apparently improved with some medication changes she is not really clear what she was taking at the time.  She denies any alcohol risk factors for viral hepatitis.  I also reviewed abdominal imaging has been completed and there is no signs of fatty liver.  Her most recent liver enzymes are again elevated.  Because of time restriction today this will be further addressed at the next office visit.

## 2024-01-07 NOTE — Progress Notes (Addendum)
 Indication: Afib, recurrent DVT, PE Pt will have upcoming d&c and hysterectomy. Surgery is not scheduled yet. Continue 1 tablet (5 mg) daily except take 1 1/2 tablets (7.5 mg) on Sunday and Wednesday. Recheck in 5 week.

## 2024-01-07 NOTE — Assessment & Plan Note (Signed)
 Most recent A1c is  Lab Results  Component Value Date   HGBA1C 5.8 01/02/2024   HGBA1C 5.8 (H) 07/31/2021    Patient aware of disease state and risk of progression. This may contribute to abnormal cravings, fatigue and diabetic complications without having diabetes.   Advised to maintain a diet low on simple and processed carbohydrates.  Continue metformin  XR 500 mg twice a day for pharmacoprophylaxis and weight management Started liraglutide for pharmacoprophylaxis.

## 2024-01-08 ENCOUNTER — Encounter (INDEPENDENT_AMBULATORY_CARE_PROVIDER_SITE_OTHER): Payer: Self-pay

## 2024-01-08 ENCOUNTER — Ambulatory Visit (INDEPENDENT_AMBULATORY_CARE_PROVIDER_SITE_OTHER): Admitting: Internal Medicine

## 2024-01-09 ENCOUNTER — Telehealth: Admitting: Adult Health

## 2024-01-09 ENCOUNTER — Encounter: Payer: Self-pay | Admitting: Adult Health

## 2024-01-09 DIAGNOSIS — F09 Unspecified mental disorder due to known physiological condition: Secondary | ICD-10-CM

## 2024-01-09 DIAGNOSIS — G4700 Insomnia, unspecified: Secondary | ICD-10-CM | POA: Diagnosis not present

## 2024-01-09 DIAGNOSIS — F310 Bipolar disorder, current episode hypomanic: Secondary | ICD-10-CM

## 2024-01-09 DIAGNOSIS — F411 Generalized anxiety disorder: Secondary | ICD-10-CM | POA: Diagnosis not present

## 2024-01-09 MED ORDER — CLONAZEPAM 0.5 MG PO TABS: 0.5000 mg | ORAL_TABLET | Freq: Two times a day (BID) | ORAL | 0 refills | Status: DC

## 2024-01-09 NOTE — Progress Notes (Signed)
 Emily Rice [id number] [birth year] 63 y.o.  Virtual Visit via Video Note  I connected with pt @ on 01/09/24 at  9:00 AM EDT by a video enabled telemedicine application and verified that I am speaking with the correct person using two identifiers.   I discussed the limitations of evaluation and management by telemedicine and the availability of in person appointments. The patient expressed understanding and agreed to proceed.  I discussed the assessment and treatment plan with the patient. The patient was provided an opportunity to ask questions and all were answered. The patient agreed with the plan and demonstrated an understanding of the instructions.   The patient was advised to call back or seek an in-person evaluation if the symptoms worsen or if the condition fails to improve as anticipated.  I provided 25 minutes of non-face-to-face time during this encounter.  The patient was located at home.  The provider was located at [facility].   Emily LOISE Sayers, NP   Subjective:   Patient ID:  Emily Rice is a 63 y.o. (DOB 12-31-1960) female.  Chief Complaint: No chief complaint on file.   HPI Emily Rice presents for follow-up of Bipolar effective disorder, cognitive disorder, GAD, and insomnia.    Describes mood today as ok. Reports tearfulness at times. Mood symptoms - denies depression and anxiety. Reports improved interest and motivation. Reports irritability at times. Denies panic attacks. Reports some worry - children. Denies worry rumination and over thinking. Reports obsessive thoughts and acts. Reports mood is mostly stable. Stating I feel like I'm doing alright. Feels like medications are helpful. Taking medications as prescribed.  Energy levels stable. Active, has a regular exercise routine. Enjoys some usual interests and activities. Married. Lives with husband. Has 2 grown children. Spending time with family. Appetite adequate. Weight loss - 10+  pounds working with [facility] weight loss [facility]. Sleeps better some nights than others. Averages 7 to 8hours. Denies daytime napping. Reports ongoing difficulties with focus and concentration. Completing tasks. Managing aspects of household. 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 Denies SI or HI.  Denies AH or VH. Denies self harm. Denies substance use.   Review of Systems:  Review of Systems  Musculoskeletal:  Negative for gait problem.  Neurological:  Negative for tremors.  Psychiatric/Behavioral:         Please refer to HPI    Medications: I have reviewed the patient's current medications.  Current Outpatient Medications  Medication Sig Dispense Refill   acetaminophen  (TYLENOL ) 325 MG tablet Take 2 tablets (650 mg total) by mouth every 6 (six) hours as needed for mild pain or moderate pain. (Patient taking differently: Take 650 mg by mouth as needed for mild pain (pain score 1-3) or moderate pain (pain score 4-6).)     acetaminophen  (TYLENOL ) 650 MG CR tablet Take 1,300 mg by mouth every 8 (eight) hours as needed for pain. (Patient taking differently: Take 1,300 mg by mouth as needed for pain.)     busPIRone  (BUSPAR ) 30 MG tablet Take 1 tablet (30 mg total) by mouth 2 (two) times daily. 180 tablet 0   calcium  carbonate (TUMS) 500 MG chewable tablet Chew 1 tablet (200 mg of elemental calcium  total) by mouth daily. 90 tablet 3   cephALEXin (KEFLEX) 500 MG capsule Take 1 capsule (500 mg total) by mouth 2 (two) times daily. 10 capsule 0   cetirizine (ZYRTEC) 10 MG tablet Take 10 mg by mouth daily as needed for allergies. (  Patient taking differently: Take 10 mg by mouth as needed for allergies.)     Cholecalciferol  75 MCG (3000 UT) TABS Take 3,000 Units by mouth daily. 30 tablet 0   clonazePAM  (KLONOPIN ) 0.5 MG tablet TAKE 1 TABLET TWICE DAILY 60 tablet 0   diltiazem  (CARDIZEM ) 30 MG tablet TAKE 1 TABLET EVERY 4 HOURS AS NEEDED FOR AFIB HEART RATE >95 AS LONG AS TOP BP >100.  90 tablet 1   escitalopram  (LEXAPRO ) 10 MG tablet TAKE 1 TABLET AT BEDTIME 90 tablet 0   fexofenadine (ALLEGRA) 180 MG tablet Take 180 mg by mouth as needed for allergies or rhinitis.     fluticasone  (FLONASE ) 50 MCG/ACT nasal spray Place 1 spray into both nostrils daily as needed for allergies.      ibuprofen (ADVIL) 200 MG tablet Take 800 mg by mouth as needed.     lamoTRIgine  (LAMICTAL ) 200 MG tablet TAKE 1 TABLET TWICE DAILY 180 tablet 0   Liraglutide -Weight Management 18 MG/3ML SOPN INJECT 0.6 MG INTO THE SKIN DAILY FOR 7 DAYS, THEN 1.2 MG DAILY FOR 7 DAYS, THEN 1.8 MG DAILY FOR 14 DAYS. 6 mL 0   losartan  (COZAAR ) 25 MG tablet Take 1 tablet (25 mg total) by mouth daily. 90 tablet 3   losartan  (COZAAR ) 25 MG tablet Take 1 tablet (25 mg total) by mouth daily. 30 tablet 3   lumateperone  tosylate (CAPLYTA ) 42 MG capsule Take 1 capsule (42 mg total) by mouth daily. 90 capsule 0   metFORMIN  (GLUCOPHAGE -XR) 500 MG 24 hr tablet Take 1 tablet (500 mg total) by mouth 2 (two) times daily with a meal. 180 tablet 0   Multiple Vitamin (MULTIVITAMIN WITH MINERALS) TABS tablet Take 1 tablet by mouth daily.     ondansetron  (ZOFRAN -ODT) 4 MG disintegrating tablet Take 1 tablet (4 mg total) by mouth every 8 (eight) hours as needed. 20 tablet 6   pantoprazole  (PROTONIX ) 40 MG tablet Take 1 tablet (40 mg total) by mouth daily. 90 tablet 1   polyethylene glycol (MIRALAX  / GLYCOLAX ) 17 g packet Take 17 g by mouth 2 (two) times daily. (Patient taking differently: Take 17 g by mouth as needed for moderate constipation.) 14 each 0   potassium chloride  SA (KLOR-CON  M) 20 MEQ tablet Take 1 tablet (20 mEq total) by mouth daily. 90 tablet 3   QUEtiapine  (SEROQUEL ) 50 MG tablet TAKE 1 TO 2 TABLETS AT BEDTIME 180 tablet 0   rizatriptan  (MAXALT -MLT) 10 MG disintegrating tablet Take 1 tablet (10 mg total) by mouth as needed. May repeat in 2 hours if needed 10 tablet 6   warfarin (COUMADIN ) 5 MG tablet TAKE 1 TABLET (5 MG) BY  MOUTH DAILY EXCEPT TAKE 1 AND 1/2 TABLETS (7.5 MG) ON WEDNESD AND SUNDAY OR AS DIRECTED BY ANTICOAGULATION CLINIC 110 tablet 1   No current facility-administered medications for this visit.    Medication Side Effects: None  Allergies:  Allergies  Allergen Reactions   Hydromorphone  Other (See Comments)    Stroke-like symptoms. treated with Narcan; Tolerated Percocet in the past.   Iodinated Contrast Media Other (See Comments)    Reaction not fully known   Prochlorperazine Swelling and Other (See Comments)    Compazine - tongue swelling    Past Medical History:  Diagnosis Date   A-fib (HCC)    Allergy    Anxiety    Bipolar disorder (HCC)    Chondromalacia patellae 12/14/2003   Depression    DVT (deep vein thrombosis) in  pregnancy    DVT (deep venous thrombosis) (HCC) 2001 and 2002   DVT (deep venous thrombosis) (HCC)    x 2   Enlarged pancreas 07/02/2023   Fulness of pancreatic head on CT    Esophagitis 06/06/2016   GERD (gastroesophageal reflux disease)    Hemorrhage    Hyperlipidemia    Migraines    Pulmonary embolism (HCC) 2001   S/P cholecystectomy 09/11/2012   Sickle cell anemia (HCC)    Suicidal ideation 12/28/2008   Tremor     Family History  Problem Relation Age of Onset   Hypertension Mother    Stroke Father    Hypertension Father    Atrial fibrillation Father    Heart disease Father    Diabetes Sister    CAD Brother    Anxiety disorder Daughter    ADD / ADHD Son    Depression Son    Hypertension Maternal Uncle    Liver disease Neg Hx    Esophageal cancer Neg Hx    Colon cancer Neg Hx     Social History   Socioeconomic History   Marital status: Married    Spouse name: Marine Scientist   Number of children: 2   Years of education: college   Highest education level: Master's degree (e.g., MA, MS, MEng, MEd, MSW, MBA)  Occupational History   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   Occupation: retired  Tobacco Use   Smoking status: Never   Smokeless  tobacco: Never   Tobacco comments:    Never smoked 09/20/23  Vaping Use   Vaping status: Never Used  Substance and Sexual Activity   Alcohol use: Never    Alcohol/week: 2.0 standard drinks of alcohol    Types: 2 Standard drinks or equivalent per week    Comment: occasional   Drug use: Not Currently    Frequency: 7.0 times per week    Types: Benzodiazepines   Sexual activity: Yes    Birth control/protection: Post-menopausal  Other Topics Concern   Not on file  Social History Narrative   ** Merged History Encounter ** Lives with husband.   Right-handed.   No daily use of caffeine .   Lives with husband.-2025      Social Drivers of Health   Financial Resource Strain: Low Risk  (12/21/2023)   Overall Financial Resource Strain (CARDIA)    Difficulty of Paying Living Expenses: Not very hard  Food Insecurity: Food Insecurity Present (12/21/2023)   Hunger Vital Sign    Worried About Running Out of Food in the Last Year: Never true    Ran Out of Food in the Last Year: Sometimes true  Transportation Needs: No Transportation Needs (12/21/2023)   PRAPARE - Administrator, Civil Service (Medical): No    Lack of Transportation (Non-Medical): No  Physical Activity: Insufficiently Active (12/21/2023)   Exercise Vital Sign    Days of Exercise per Week: 4 days    Minutes of Exercise per Session: 30 min  Stress: No Stress Concern Present (12/21/2023)   [employer] of Occupational Health - Occupational Stress Questionnaire    Feeling of Stress: Only a little  Social Connections: Moderately Integrated (12/21/2023)   Social Connection and Isolation Panel    Frequency of Communication with Friends and Family: More than three times a week    Frequency of Social Gatherings with Friends and Family: Never    Attends Religious Services: More than 4 times per year    Active Member of  Clubs or Organizations: No    Attends Banker Meetings: Not on file    Marital  Status: Married  Intimate Partner Violence: Not At Risk (07/24/2023)   Humiliation, Afraid, Rape, and Kick questionnaire    Fear of Current or Ex-Partner: No    Emotionally Abused: No    Physically Abused: No    Sexually Abused: No    Past Medical History, Surgical history, Social history, and Family history were reviewed and updated as appropriate.   Please see review of systems for further details on the patient's review from today.   Objective:   Physical Exam:  LMP  (LMP Unknown)   Physical Exam Constitutional:      General: She is not in acute distress. Musculoskeletal:        General: No deformity.  Neurological:     Mental Status: She is alert and oriented to person, place, and time.     Coordination: Coordination normal.  Psychiatric:        Attention and Perception: Attention and perception normal. She does not perceive auditory or visual hallucinations.        Mood and Affect: Mood normal. Mood is not anxious or depressed. Affect is not labile, blunt, angry or inappropriate.        Speech: Speech normal.        Behavior: Behavior normal.        Thought Content: Thought content normal. Thought content is not paranoid or delusional. Thought content does not include homicidal or suicidal ideation. Thought content does not include homicidal or suicidal plan.        Cognition and Memory: Cognition and memory normal.        Judgment: Judgment normal.     Comments: Insight intact     Lab Review:     Component Value Date/Time   NA 142 01/02/2024 0946   NA 141 12/17/2023 1042   K 4.3 01/02/2024 0946   CL 104 01/02/2024 0946   CO2 29 01/02/2024 0946   GLUCOSE 84 01/02/2024 0946   BUN 19 01/02/2024 0946   BUN 15 12/17/2023 1042   CREATININE 1.12 01/02/2024 0946   CALCIUM  9.3 01/02/2024 0946   PROT 7.4 01/02/2024 0946   PROT 7.2 10/01/2023 1058   ALBUMIN  4.5 01/02/2024 0946   ALBUMIN  4.4 10/01/2023 1058   AST 41 (H) 01/02/2024 0946   ALT 44 (H) 01/02/2024 0946    ALKPHOS 52 01/02/2024 0946   BILITOT 0.5 01/02/2024 0946   BILITOT 0.3 10/01/2023 1058   GFRNONAA >60 08/15/2023 1432   GFRAA >60 12/10/2019 1600       Component Value Date/Time   WBC 5.8 01/02/2024 0946   RBC 5.04 01/02/2024 0946   HGB 13.0 01/02/2024 0946   HGB 12.9 11/13/2023 1037   HCT 41.1 01/02/2024 0946   HCT 41.2 11/13/2023 1037   PLT 175.0 01/02/2024 0946   PLT 182 11/13/2023 1037   MCV 81.5 01/02/2024 0946   MCV 82 11/13/2023 1037   MCH 25.7 (L) 11/13/2023 1037   MCH 26.9 08/15/2023 1432   MCHC 31.6 01/02/2024 0946   RDW 17.7 (H) 01/02/2024 0946   RDW 15.9 (H) 11/13/2023 1037   LYMPHSABS 2.2 01/02/2024 0946   MONOABS 0.4 01/02/2024 0946   EOSABS 0.2 01/02/2024 0946   BASOSABS 0.0 01/02/2024 0946    No results found for: POCLITH, LITHIUM   No results found for: PHENYTOIN, PHENOBARB, VALPROATE, CBMZ   .res Assessment: Plan:    Plan:  PDMP reviewed  Continue:  Seroquel  100mg  at bedtime   Clonazepam  0.5mg  BID  Caplyta  42mg  daily - using samples due to cost Lamictal  200mg  twice daily Lexapro  10mg  daily Buspar  30mg  BID for anxiety - dose increase  RTC 4 weeks  25 minutes spent dedicated to the care of this patient on the date of this encounter to include pre-visit review of records, ordering of medication, post visit documentation, and face-to-face time with the patient discussing Bipolar effective disorder, cognitive disorder, GAD, and insomnia. Discussed continuing current medication regimen. Also spoke with patient's husband about her current medications and treatment.   Seeing therapist - Marval Bunde  Patient advised to contact office with any questions, adverse effects, or acute worsening in signs and symptoms.   Discussed potential benefits, risk, and side effects of benzodiazepines to include potential risk of tolerance and dependence, as well as possible drowsiness. Advised patient not to drive if experiencing drowsiness and to take  lowest possible effective dose to minimize risk of dependence and tolerance.   Discussed potential metabolic side effects associated with atypical antipsychotics, as well as potential risk for movement side effects. Advised pt to contact office if movement side effects occur.    There are no diagnoses linked to this encounter.   Please see After Visit Summary for patient specific instructions.  Future Appointments  Date Time Provider Department Center  01/09/2024  9:00 AM Liandra Mendia, Emily Mattocks, NP CP-CP None  01/14/2024  8:00 AM [facility]  1 [facility]-US  [facility]  01/27/2024  3:40 PM Francyne Romano, MD MWM-MWM None  02/11/2024  8:10 AM GI-[facility]G MM 3 GI-BCGMM GI-BREAST CE  02/11/2024 10:00 AM [facility]  02/28/2024  8:20 AM Henson, Vickie L, NP-C [facility]  02/28/2024 11:20 AM Aniceto Daphne CROME, NP CVD-MAGST H&V  08/11/2024  8:00 AM Henson, Vickie L, NP-C [facility]    No orders of the defined types were placed in this encounter.     -------------------------------

## 2024-01-14 ENCOUNTER — Ambulatory Visit (HOSPITAL_COMMUNITY)
Admission: RE | Admit: 2024-01-14 | Discharge: 2024-01-14 | Disposition: A | Discharge status: Home / Self Care | Source: Ambulatory Visit | Attending: Obstetrics and Gynecology | Admitting: Obstetrics and Gynecology

## 2024-01-14 DIAGNOSIS — N950 Postmenopausal bleeding: Secondary | ICD-10-CM | POA: Insufficient documentation

## 2024-01-16 ENCOUNTER — Encounter (INDEPENDENT_AMBULATORY_CARE_PROVIDER_SITE_OTHER): Payer: Self-pay

## 2024-01-16 ENCOUNTER — Telehealth (INDEPENDENT_AMBULATORY_CARE_PROVIDER_SITE_OTHER): Payer: Self-pay | Admitting: Internal Medicine

## 2024-01-16 ENCOUNTER — Other Ambulatory Visit (INDEPENDENT_AMBULATORY_CARE_PROVIDER_SITE_OTHER): Payer: Self-pay

## 2024-01-16 DIAGNOSIS — Z6831 Body mass index (BMI) 31.0-31.9, adult: Secondary | ICD-10-CM

## 2024-01-16 DIAGNOSIS — R7303 Prediabetes: Secondary | ICD-10-CM

## 2024-01-16 MED ORDER — METFORMIN HCL ER 500 MG PO TB24: 500.0000 mg | ORAL_TABLET | Freq: Two times a day (BID) | ORAL | 0 refills | Status: AC

## 2024-01-16 NOTE — Telephone Encounter (Signed)
 Left pt a message, Metformin  #180 was sent on  12/03/23, to check with pharmacy and to have a happy bithday

## 2024-01-16 NOTE — Telephone Encounter (Signed)
 Pt called and was to have metformin  sent to the pharmacy and they have not gotten it yet. Ok to send my chart message.

## 2024-01-17 ENCOUNTER — Other Ambulatory Visit: Payer: Self-pay | Admitting: Adult Health

## 2024-01-17 ENCOUNTER — Telehealth: Payer: Self-pay

## 2024-01-17 DIAGNOSIS — F310 Bipolar disorder, current episode hypomanic: Secondary | ICD-10-CM

## 2024-01-17 NOTE — Telephone Encounter (Signed)
 I received a message from the office stating the patient called them to schedule surgery. I called the patient to let her know Dr. Keven first available will be 04/01/23. I left a detailed message and requested a call back.

## 2024-01-22 ENCOUNTER — Telehealth: Payer: Self-pay

## 2024-01-22 NOTE — Telephone Encounter (Signed)
 Patient called back to confirm she's available for surgery on 03/31/24 at 2:15 pm w/ Dr. Alger at [facility]. Surgery details and pre-op instructions were provided by phone.

## 2024-01-27 ENCOUNTER — Other Ambulatory Visit: Payer: Self-pay | Admitting: Adult Health

## 2024-01-27 ENCOUNTER — Ambulatory Visit (INDEPENDENT_AMBULATORY_CARE_PROVIDER_SITE_OTHER): Payer: Self-pay | Admitting: Internal Medicine

## 2024-01-27 DIAGNOSIS — F411 Generalized anxiety disorder: Secondary | ICD-10-CM

## 2024-01-27 DIAGNOSIS — F310 Bipolar disorder, current episode hypomanic: Secondary | ICD-10-CM

## 2024-01-28 ENCOUNTER — Encounter: Admitting: Obstetrics and Gynecology

## 2024-01-30 ENCOUNTER — Ambulatory Visit (INDEPENDENT_AMBULATORY_CARE_PROVIDER_SITE_OTHER): Admitting: Internal Medicine

## 2024-01-30 ENCOUNTER — Encounter (INDEPENDENT_AMBULATORY_CARE_PROVIDER_SITE_OTHER): Payer: Self-pay | Admitting: Internal Medicine

## 2024-01-30 VITALS — BP 125/79 | HR 54 | Temp 97.6°F | Ht 63.0 in | Wt 170.0 lb

## 2024-01-30 DIAGNOSIS — T50905A Adverse effect of unspecified drugs, medicaments and biological substances, initial encounter: Secondary | ICD-10-CM

## 2024-01-30 DIAGNOSIS — Z6831 Body mass index (BMI) 31.0-31.9, adult: Secondary | ICD-10-CM | POA: Diagnosis not present

## 2024-01-30 DIAGNOSIS — R748 Abnormal levels of other serum enzymes: Secondary | ICD-10-CM

## 2024-01-30 DIAGNOSIS — E66811 Obesity, class 1: Secondary | ICD-10-CM | POA: Diagnosis not present

## 2024-01-30 DIAGNOSIS — T4395XA Adverse effect of unspecified psychotropic drug, initial encounter: Secondary | ICD-10-CM | POA: Diagnosis not present

## 2024-01-30 DIAGNOSIS — R7303 Prediabetes: Secondary | ICD-10-CM

## 2024-01-30 MED ORDER — LIRAGLUTIDE -WEIGHT MANAGEMENT 18 MG/3ML ~~LOC~~ SOPN: PEN_INJECTOR | SUBCUTANEOUS | 0 refills | Status: DC

## 2024-01-30 MED ORDER — BD PEN NEEDLE MINI U/F 31G X 5 MM MISC: 1.0000 | Freq: Every day | 0 refills | Status: AC

## 2024-01-30 NOTE — Assessment & Plan Note (Signed)
 I reviewed recent labs done by primary care team that shows elevation in liver enzymes.Mild elevation in liver enzymes noted. Possible causes include medication burden or fatty liver.  She does not drink alcohol and denies risk factors for viral hepatitis.  Recent ferritin was normal.  She is on warfarin but it looks like synthetic function of the liver is normal.  Further evaluation is planned to determine the cause.Patient will be undergoing further evaluation by primary care team.  We will follow along and review test results.  Patient has multiple risk factors for MASLD.

## 2024-01-30 NOTE — Assessment & Plan Note (Signed)
 Obesity is associated with psychotropic medication use and prediabetes. She has gained one pound since the last visit despite a plant-based diet and regular exercise. Metformin  is used to offset weight gain and manage prediabetes. Liraglutide  is considered for weight management due to its benefits in appetite control and reducing the risk of weight gain related to psychiatric medications, prediabetes, and cardiovascular events. The cost of liraglutide  is a concern, but it is less expensive than alternatives like Wegovy . A one-month trial of liraglutide  is recommended to assess its effectiveness and tolerability. The medication does not interact with her other medications and underlying comorbidities, making it a safe option given her medical conditions. - Prescribed liraglutide  with a one-month trial. - Instructed to start at 0.6 mg for one week, then increase to 1.2 mg for one week, and finally to 1.8 mg. - Advised to use GoodRx for cost management. - Provided instructions on using the Victoza  pen and rotating injection sites. - Continue metformin  for prediabetes management. - Maintain a diet of 1000 calories per day with 75-80 grams of protein. - Provided a handout on staying on track during the holidays. -She has contraindications to older generations antiobesity medications

## 2024-01-30 NOTE — Progress Notes (Signed)
 Office: [phone]  /  Fax: [phone]  Weight Summary and Body Composition Analysis (BIA)  Vitals Temp: 97.6 F (36.4 C) BP: 125/79 Pulse Rate: (!) 54 SpO2: 98 %   Anthropometric Measurements Height: 5' 3 (1.6 m) Weight: 170 lb (77.1 kg) BMI (Calculated): 30.12 Weight at Last Visit: 169 lb Weight Lost Since Last Visit: 0 lb Weight Gained Since Last Visit: 1 lb Starting Weight: 179 lb Total Weight Loss (lbs): 9 lb (4.082 kg) Peak Weight: 203 lb   Body Composition  Body Fat %: 39.5 % Fat Mass (lbs): 67.4 lbs Muscle Mass (lbs): 98 lbs Total Body Water (lbs): 68.2 lbs Visceral Fat Rating : 10    RMR: 1325  Today's Visit #: 10  Starting Date: 03/14/23   Subjective   Chief Complaint: Obesity  Interval History Discussed the use of AI scribe software for clinical note transcription with the patient, who gave verbal consent to proceed.  History of Present Illness Jerriann Schrom Headrick is a 63 year old female with obesity who presents for medical weight management.  She is experiencing challenges with weight management, exacerbated by her psychotropic medications. Despite adhering to a plant-based diet 90% of the time and engaging in 45 minutes of cardio and strength training five days a week, she has gained one pound since her last visit.  Her current medications include Klonopin , Seroquel , Caplyta , buspirone , Lexapro , Lamictal , and metformin . She was previously prescribed generic liraglutide  but is unsure if she followed through with it.  She is frustrated with her slow metabolism and difficulty losing weight despite her efforts. She has a history of brain injury and is currently taking multiple medications.  Recent lab work showed mild elevation in liver enzymes, which is concerning to her. She has no alcohol consumption and is scheduled for repeat testing. She has a history of gallbladder removal and is concerned about potential liver issues related to her  medications.  She has difficulty sleeping due to worry about her health and weight gain, stating 'it's been hard to sleep as far as, you know, just toss and turn, just worry.' She maintains a diet of approximately 1000 calories per day, with 75-80 grams of protein, and uses meal replacements like protein shakes.     Challenges affecting patient progress: medical comorbidities, presence of obesogenic drugs, menopause, and having difficulties with GLP-1 or AOM coverage.    Pharmacotherapy for weight management: She is currently taking Metformin  (off label use for weight management and / or insulin  resistance and / or diabetes prevention) with adequate clinical response  and without side effects..   Assessment and Plan   Treatment Plan For Obesity:  Recommended Dietary Goals  Elisia is currently in the action stage of change. As such, her goal is to continue weight management plan. She has agreed to: follow the Category 1 plan - 1000 kcal per day and incorporate 1-2 meal replacements a day for convenience   Behavioral Health and Counseling  We discussed the following behavioral modification strategies today: work on tracking and journaling calories using tracking application and display self-compassion.  Additional education and resources provided today: Handout on traveling and holiday eating strategies  Recommended Physical Activity Goals  Patt has been advised to work up to 150 minutes of moderate intensity aerobic activity a week and strengthening exercises 2-3 times per week for cardiovascular health, weight loss maintenance and preservation of muscle mass.  She has agreed to :  Think about enjoyable ways to increase daily physical activity and  overcoming barriers to exercise, Increase physical activity in their day and reduce sedentary time (increase NEAT)., Increase volume of physical activity to a goal of 240 minutes a week, and Combine aerobic and strengthening exercises for  efficiency and improved cardiometabolic health.  Medical Interventions and Pharmacotherapy  We discussed various medication options to help Ashlyn with her weight loss efforts and we both agreed to : Last office visit she was prescribed liraglutide but never filled medication there was some misinformation about the cost this medication is now generic she [facility].  I feel that considering lack of response to lifestyle changes and underlying comorbidities and presence of obesogenic drugs she would benefit from GLP-1 if not cost prohibitive over time.  Her insurance does not cover GLP-1 based on current clinical context.  Associated Conditions Impacted by Obesity Treatment  Assessment & Plan Class 1 obesity with serious comorbidity and body mass index (BMI) of 31.0 to 31.9 in adult, unspecified obesity type Weight gain due to medication Obesity is associated with psychotropic medication use and prediabetes. She has gained one pound since the last visit despite a plant-based diet and regular exercise. Metformin  is used to offset weight gain and manage prediabetes. Liraglutide is considered for weight management due to its benefits in appetite control and reducing the risk of weight gain related to psychiatric medications, prediabetes, and cardiovascular events. The cost of liraglutide is a concern, but it is less expensive than alternatives like Wegovy . A one-month trial of liraglutide is recommended to assess its effectiveness and tolerability. The medication does not interact with her other medications and underlying comorbidities, making it a safe option given her medical conditions. - Prescribed liraglutide with a one-month trial. - Instructed to start at 0.6 mg for one week, then increase to 1.2 mg for one week, and finally to 1.8 mg. - Advised to use GoodRx for cost management. - Provided instructions on using the Victoza pen and rotating injection  sites. - Continue metformin  for prediabetes management. - Maintain a diet of 1000 calories per day with 75-80 grams of protein. - Provided a handout on staying on track during the holidays. -She has contraindications to older generations antiobesity medications Prediabetes Most recent A1c is  Lab Results  Component Value Date   HGBA1C 5.8 01/02/2024   HGBA1C 5.8 (H) 07/31/2021    Patient aware of disease state and risk of progression. This may contribute to abnormal cravings, fatigue and diabetic complications without having diabetes.   Advised to maintain a diet low on simple and processed carbohydrates.  Continue metformin  XR 500 mg twice a day for pharmacoprophylaxis and weight management Started liraglutide for pharmacoprophylaxis. Elevated liver enzymes I reviewed recent labs done by primary care team that shows elevation in liver enzymes.Mild elevation in liver enzymes noted. Possible causes include medication burden or fatty liver.  She does not drink alcohol and denies risk factors for viral hepatitis.  Recent ferritin was normal.  She is on warfarin but it looks like synthetic function of the liver is normal.  Further evaluation is planned to determine the cause.Patient will be undergoing further evaluation by primary care team.  We will follow along and review test results.  Patient has multiple risk factors for MASLD.          Objective   Physical Exam:  Blood pressure 125/79, pulse (!) 54, temperature 97.6 F (36.4 C), height 5' 3 (1.6 m), weight 170 lb (77.1 kg), SpO2 98%. Body  mass index is 30.11 kg/m.  General: She is overweight, cooperative, alert, well developed, and in no acute distress. PSYCH: Has normal mood, affect and thought process.   HEENT: EOMI, sclerae are anicteric. Lungs: Normal breathing effort, no conversational dyspnea. Extremities: No edema.  Neurologic: No gross sensory or motor deficits. No tremors or fasciculations noted.    Diagnostic  Data Reviewed:  BMET    Component Value Date/Time   NA 142 01/02/2024 0946   NA 141 12/17/2023 1042   K 4.3 01/02/2024 0946   CL 104 01/02/2024 0946   CO2 29 01/02/2024 0946   GLUCOSE 84 01/02/2024 0946   BUN 19 01/02/2024 0946   BUN 15 12/17/2023 1042   CREATININE 1.12 01/02/2024 0946   CALCIUM  9.3 01/02/2024 0946   GFRNONAA >60 08/15/2023 1432   GFRAA >60 12/10/2019 1600   Lab Results  Component Value Date   HGBA1C 5.8 01/02/2024   HGBA1C 5.8 (H) 07/31/2021   Lab Results  Component Value Date   INSULIN  8.7 03/14/2023   Lab Results  Component Value Date   TSH 5.66 (H) 01/02/2024   CBC    Component Value Date/Time   WBC 5.8 01/02/2024 0946   RBC 5.04 01/02/2024 0946   HGB 13.0 01/02/2024 0946   HGB 12.9 11/13/2023 1037   HCT 41.1 01/02/2024 0946   HCT 41.2 11/13/2023 1037   PLT 175.0 01/02/2024 0946   PLT 182 11/13/2023 1037   MCV 81.5 01/02/2024 0946   MCV 82 11/13/2023 1037   MCH 25.7 (L) 11/13/2023 1037   MCH 26.9 08/15/2023 1432   MCHC 31.6 01/02/2024 0946   RDW 17.7 (H) 01/02/2024 0946   RDW 15.9 (H) 11/13/2023 1037   Iron Studies    Component Value Date/Time   FERRITIN 18.9 01/02/2024 0946   Lipid Panel     Component Value Date/Time   CHOL 132 07/25/2023 0502   CHOL 193 02/27/2021 1013   TRIG 76 07/25/2023 0502   HDL 56 07/25/2023 0502   HDL 60 02/27/2021 1013   CHOLHDL 2.4 07/25/2023 0502   VLDL 15 07/25/2023 0502   LDLCALC 61 07/25/2023 0502   LDLCALC 115 (H) 02/27/2021 1013   LDLDIRECT 111 (H) 02/27/2021 1013   Hepatic Function Panel     Component Value Date/Time   PROT 7.4 01/02/2024 0946   PROT 7.2 10/01/2023 1058   ALBUMIN  4.5 01/02/2024 0946   ALBUMIN  4.4 10/01/2023 1058   AST 41 (H) 01/02/2024 0946   ALT 44 (H) 01/02/2024 0946   ALKPHOS 52 01/02/2024 0946   BILITOT 0.5 01/02/2024 0946   BILITOT 0.3 10/01/2023 1058   BILIDIR <0.1 07/23/2023 0528   IBILI NOT CALCULATED 07/23/2023 0528      Component Value Date/Time    TSH 5.66 (H) 01/02/2024 0946   Nutritional Lab Results  Component Value Date   VD25OH 56.84 10/17/2023   VD25OH 54.08 07/26/2023   VD25OH 59.4 03/14/2023    Medications: Outpatient Encounter Medications as of 01/30/2024  Medication Sig Note   acetaminophen  (TYLENOL ) 325 MG tablet Take 2 tablets (650 mg total) by mouth every 6 (six) hours as needed for mild pain or moderate pain. (Patient taking differently: Take 650 mg by mouth as needed for mild pain (pain score 1-3) or moderate pain (pain score 4-6).)    acetaminophen  (TYLENOL ) 650 MG CR tablet Take 1,300 mg by mouth every 8 (eight) hours as needed for pain. (Patient taking differently: Take 1,300 mg by mouth as needed for pain.)  busPIRone  (BUSPAR ) 30 MG tablet TAKE 1 TABLET TWICE DAILY    calcium  carbonate (TUMS) 500 MG chewable tablet Chew 1 tablet (200 mg of elemental calcium  total) by mouth daily.    Cholecalciferol  75 MCG (3000 UT) TABS Take 3,000 Units by mouth daily.    escitalopram  (LEXAPRO ) 10 MG tablet TAKE 1 TABLET AT BEDTIME    fexofenadine (ALLEGRA) 180 MG tablet Take 180 mg by mouth as needed for allergies or rhinitis.    fluticasone  (FLONASE ) 50 MCG/ACT nasal spray Place 1 spray into both nostrils daily as needed for allergies.     ibuprofen (ADVIL) 200 MG tablet Take 800 mg by mouth as needed.    Insulin  Pen Needle (B-D UF III MINI PEN NEEDLES) 31G X 5 MM MISC 1 Pen by Does not apply route daily.    lamoTRIgine  (LAMICTAL ) 200 MG tablet TAKE 1 TABLET TWICE DAILY    losartan  (COZAAR ) 25 MG tablet Take 1 tablet (25 mg total) by mouth daily.    losartan  (COZAAR ) 25 MG tablet Take 1 tablet (25 mg total) by mouth daily.    lumateperone  tosylate (CAPLYTA ) 42 MG capsule Take 1 capsule (42 mg total) by mouth daily.    metFORMIN  (GLUCOPHAGE -XR) 500 MG 24 hr tablet Take 1 tablet (500 mg total) by mouth 2 (two) times daily with a meal.    Multiple Vitamin (MULTIVITAMIN WITH MINERALS) TABS tablet Take 1 tablet by mouth daily.     ondansetron  (ZOFRAN -ODT) 4 MG disintegrating tablet Take 1 tablet (4 mg total) by mouth every 8 (eight) hours as needed. 12/02/2023: Has not received yet   pantoprazole  (PROTONIX ) 40 MG tablet Take 1 tablet (40 mg total) by mouth daily.    polyethylene glycol (MIRALAX  / GLYCOLAX ) 17 g packet Take 17 g by mouth 2 (two) times daily. (Patient taking differently: Take 17 g by mouth as needed for moderate constipation.)    potassium chloride  SA (KLOR-CON  M) 20 MEQ tablet Take 1 tablet (20 mEq total) by mouth daily.    QUEtiapine  (SEROQUEL ) 50 MG tablet TAKE 1 TO 2 TABLETS AT BEDTIME    rizatriptan  (MAXALT -MLT) 10 MG disintegrating tablet Take 1 tablet (10 mg total) by mouth as needed. May repeat in 2 hours if needed    warfarin (COUMADIN ) 5 MG tablet TAKE 1 TABLET (5 MG) BY MOUTH DAILY EXCEPT TAKE 1 AND 1/2 TABLETS (7.5 MG) ON WEDNESD AND SUNDAY OR AS DIRECTED BY ANTICOAGULATION CLINIC    [DISCONTINUED] cephALEXin (KEFLEX) 500 MG capsule Take 1 capsule (500 mg total) by mouth 2 (two) times daily.    [DISCONTINUED] Liraglutide -Weight Management 18 MG/3ML SOPN INJECT 0.6 MG INTO THE SKIN DAILY FOR 7 DAYS, THEN 1.2 MG DAILY FOR 7 DAYS, THEN 1.8 MG DAILY FOR 14 DAYS.    cetirizine (ZYRTEC) 10 MG tablet Take 10 mg by mouth daily as needed for allergies. (Patient not taking: Reported on 01/30/2024)    clonazePAM  (KLONOPIN ) 0.5 MG tablet Take 1 tablet (0.5 mg total) by mouth 2 (two) times daily. (Patient not taking: Reported on 01/30/2024)    diltiazem  (CARDIZEM ) 30 MG tablet TAKE 1 TABLET EVERY 4 HOURS AS NEEDED FOR AFIB HEART RATE >95 AS LONG AS TOP BP >100. (Patient not taking: Reported on 01/30/2024)    Liraglutide -Weight Management 18 MG/3ML SOPN Inject 0.6 mg into the skin daily for 7 days, THEN 1.2 mg daily for 7 days, THEN 1.8 mg daily for 14 days.    No facility-administered encounter medications on file as  of 01/30/2024.     Follow-Up   Return in about 4 weeks (around 02/27/2024) for For Weight  Mangement with Dr. Francyne.SABRA She was informed of the importance of frequent follow up visits to maximize her success with intensive lifestyle modifications for her multiple health conditions.  Attestation Statement   Reviewed by clinician on day of visit: allergies, medications, problem list, medical history, surgical history, family history, social history, and previous encounter notes.   I have spent 42 minutes in the care of the patient today including: 3 minutes before the visit reviewing and preparing the chart. 33 minutes face-to-face assessing and reviewing listed medical problems as outlined in obesity care plan, providing nutritional and behavioral counseling on topics outlined in the obesity care plan, counseling regarding anti-obesity medication as outlined in obesity care plan, independently interpreting test results and goals of care, as described in assessment and plan, reviewing and discussing biometric information and progress, reviewing latest PCP notes and specialist consultations, and ordering medications - see orders 6 minutes after the visit updating chart and documentation of encounter.    Lucas Francyne, MD

## 2024-01-30 NOTE — Assessment & Plan Note (Signed)
 Most recent A1c is  Lab Results  Component Value Date   HGBA1C 5.8 01/02/2024   HGBA1C 5.8 (H) 07/31/2021    Patient aware of disease state and risk of progression. This may contribute to abnormal cravings, fatigue and diabetic complications without having diabetes.   Advised to maintain a diet low on simple and processed carbohydrates.  Continue metformin  XR 500 mg twice a day for pharmacoprophylaxis and weight management Started liraglutide for pharmacoprophylaxis.

## 2024-01-31 ENCOUNTER — Other Ambulatory Visit (INDEPENDENT_AMBULATORY_CARE_PROVIDER_SITE_OTHER)

## 2024-01-31 DIAGNOSIS — R7989 Other specified abnormal findings of blood chemistry: Secondary | ICD-10-CM

## 2024-01-31 DIAGNOSIS — R944 Abnormal results of kidney function studies: Secondary | ICD-10-CM | POA: Diagnosis not present

## 2024-01-31 DIAGNOSIS — R748 Abnormal levels of other serum enzymes: Secondary | ICD-10-CM

## 2024-01-31 LAB — HEPATIC FUNCTION PANEL
ALT: 29 U/L (ref 0–35)
AST: 29 U/L (ref 0–37)
Albumin: 4.4 g/dL (ref 3.5–5.2)
Alkaline Phosphatase: 51 U/L (ref 39–117)
Bilirubin, Direct: 0.1 mg/dL (ref 0.0–0.3)
Total Bilirubin: 0.4 mg/dL (ref 0.2–1.2)
Total Protein: 7.2 g/dL (ref 6.0–8.3)

## 2024-01-31 LAB — BASIC METABOLIC PANEL WITH GFR
BUN: 18 mg/dL (ref 6–23)
CO2: 29 meq/L (ref 19–32)
Calcium: 9 mg/dL (ref 8.4–10.5)
Chloride: 103 meq/L (ref 96–112)
Creatinine, Ser: 1.08 mg/dL (ref 0.40–1.20)
GFR: 55.63 mL/min — ABNORMAL LOW (ref >60.00)
Glucose, Bld: 76 mg/dL (ref 70–99)
Potassium: 4.2 meq/L (ref 3.5–5.1)
Sodium: 141 meq/L (ref 135–145)

## 2024-01-31 LAB — TSH: TSH: 6.33 u[IU]/mL — ABNORMAL HIGH (ref 0.35–5.50)

## 2024-01-31 LAB — T4, FREE: Free T4: 0.66 ng/dL (ref 0.60–1.60)

## 2024-02-03 ENCOUNTER — Other Ambulatory Visit: Payer: Self-pay | Admitting: Family Medicine

## 2024-02-03 ENCOUNTER — Ambulatory Visit: Payer: Self-pay | Admitting: Family Medicine

## 2024-02-03 MED ORDER — LEVOTHYROXINE SODIUM 25 MCG PO TABS: 25.0000 ug | ORAL_TABLET | Freq: Every day | ORAL | 1 refills | Status: DC

## 2024-02-03 NOTE — Progress Notes (Signed)
 She will need to follow up with me in 4-6 weeks for recheck of her thyroid . I sent levothyroxine  to her pharmacy today. Please let her know that she must take this medication on an empty stomach, first thing in the morning at least 30-45 minutes before coffee or food.

## 2024-02-04 ENCOUNTER — Telehealth (INDEPENDENT_AMBULATORY_CARE_PROVIDER_SITE_OTHER): Payer: Self-pay | Admitting: *Deleted

## 2024-02-04 NOTE — Telephone Encounter (Signed)
 [facility]ceived [facility] from [facility] in reference to the prescription Liraglutide . Per pharmacy, the patient insurance will not cover this medication, however they will cover Ozempic . Pharmacy stated the prescription is written for Liraglutide , which could be Victoza  or Saxenda . However the way the prescription was written, strength and dosage, inidicates Saxenda  but the patient insisted it is for Victoza  but either way her insurance will not cover it. She can get Ozempic  for $47, if appropriate please send in new prescription. Pharmacy is aware provider is not in the office this week and return on Monday as patient does not want to do anything until Dr. Francyne returns.

## 2024-02-10 ENCOUNTER — Telehealth (INDEPENDENT_AMBULATORY_CARE_PROVIDER_SITE_OTHER): Payer: Self-pay | Admitting: Internal Medicine

## 2024-02-10 ENCOUNTER — Telehealth: Payer: Self-pay | Admitting: Adult Health

## 2024-02-10 ENCOUNTER — Other Ambulatory Visit (INDEPENDENT_AMBULATORY_CARE_PROVIDER_SITE_OTHER): Payer: Self-pay

## 2024-02-10 ENCOUNTER — Encounter (INDEPENDENT_AMBULATORY_CARE_PROVIDER_SITE_OTHER): Payer: Self-pay | Admitting: Internal Medicine

## 2024-02-10 ENCOUNTER — Other Ambulatory Visit (INDEPENDENT_AMBULATORY_CARE_PROVIDER_SITE_OTHER): Payer: Self-pay | Admitting: Internal Medicine

## 2024-02-10 DIAGNOSIS — R7303 Prediabetes: Secondary | ICD-10-CM

## 2024-02-10 MED ORDER — OZEMPIC (0.25 OR 0.5 MG/DOSE) 2 MG/1.5ML ~~LOC~~ SOPN: 0.2500 mg | PEN_INJECTOR | SUBCUTANEOUS | 0 refills | Status: DC

## 2024-02-10 NOTE — Telephone Encounter (Signed)
 Good morning!  [facility]  and they need the vitoza   Patient has been having a lot of cramping in her arms and legs

## 2024-02-10 NOTE — Telephone Encounter (Signed)
 Contacted pt to RS her coumadin  clinic apt from tomorrow to Friday, 12/5. Pt also requested an apt with Corean Ku, NP the same day due to transportation difficulties. Advised pt if leg/arm cramps worsen she should contact the office for a sooner apt. Pt verbalized understanding.  Pt was also scheduled with Corean for 12/5.

## 2024-02-10 NOTE — Telephone Encounter (Signed)
 Pt is needing samples of Caplyta  42mg  . Will come get them this afternoon

## 2024-02-10 NOTE — Telephone Encounter (Signed)
 Pulled samples.

## 2024-02-11 ENCOUNTER — Ambulatory Visit

## 2024-02-14 ENCOUNTER — Ambulatory Visit: Admitting: Family Medicine

## 2024-02-14 ENCOUNTER — Ambulatory Visit

## 2024-02-14 DIAGNOSIS — Z7901 Long term (current) use of anticoagulants: Secondary | ICD-10-CM | POA: Diagnosis not present

## 2024-02-14 LAB — POCT INR: INR: 1.8 — AB (ref 2.0–3.0)

## 2024-02-14 NOTE — Progress Notes (Addendum)
 Indication: Afib, recurrent DVT, PE Pt will have upcoming d&c and hysteroscopy. Surgery is 1/20. Will discuss with PCP for hold on warfarin.  Pt had collard greens over the holiday.  Increase dose today to take 1 1/2 tablet and then continue 1 tablet (5 mg) daily except take 1 1/2 tablets (7.5 mg) on Sunday and Wednesday. Recheck in 5 week per pt request due to she also has apt with PCP that day.

## 2024-02-14 NOTE — Patient Instructions (Addendum)
 Pre visit review using our clinic review tool, if applicable. No additional management support is needed unless otherwise documented below in the visit note.  Increase dose today to take 1 1/2 tablet and then continue 1 tablet (5 mg) daily except take 1 1/2 tablets (7.5 mg) on Sunday and Wednesday. Recheck in 5 week.

## 2024-02-16 NOTE — Progress Notes (Unsigned)
  Electrophysiology Office Follow up Visit Note:    Date:  02/18/2024   ID:  Emily Rice, DOB April 05, 1960, MRN [id number]  PCP:  Lendia Boby LITTIE, NP-C  [facility] Cardiologist:  Madonna Large, DO  [facility] Electrophysiologist:  OLE ONEIDA HOLTS, MD    Interval History:     Emily Rice is a 63 y.o. female who presents for a follow up visit.   The patient had an A-fib ablation December 04, 2023. The patient saw Emily Rice January 01, 2024.  At that appointment she was maintaining normal rhythm.  She takes warfarin for stroke prophylaxis.  She is doing really well.  No complaints.  Her heart rhythm has remained stable after the ablation.       Past medical, surgical, social and family history were reviewed.  ROS:   Please see the history of present illness.    All other systems reviewed and are negative.  EKGs/Labs/Other Studies Reviewed:    The following studies were reviewed today:     EKG Interpretation Date/Time:  Tuesday February 18 2024 10:56:33 EST Ventricular Rate:  72 PR Interval:  138 QRS Duration:  92 QT Interval:  400 QTC Calculation: 438 R Axis:   72  Text Interpretation: Normal sinus rhythm Normal ECG Confirmed by Holts Ole [id number]) on 02/18/2024 11:02:03 AM    Physical Exam:    VS:  BP 132/82   Pulse 72   Ht 5' 3 (1.6 m)   Wt 174 lb 12.8 oz (79.3 kg)   LMP  (LMP Unknown)   SpO2 95%   BMI 30.96 kg/m     Wt Readings from Last 3 Encounters:  02/18/24 174 lb 12.8 oz (79.3 kg)  01/30/24 170 lb (77.1 kg)  01/07/24 169 lb (76.7 kg)     GEN: no distress CARD: RRR, No MRG RESP: No IWOB. CTAB.      ASSESSMENT:    1. Paroxysmal atrial fibrillation (HCC)   2. Primary hypertension    PLAN:    In order of problems listed above:  #Paroxysmal atrial fibrillation Maintaining sinus rhythm after September 2025 catheter ablation.  Off amiodarone . Continue Coumadin  for stroke prophylaxis  #Hypertension at goal today.   Recommend checking blood pressures 1-2 times per week at home and recording the values.  Recommend bringing these recordings to the primary care physician.  I discussed my upcoming departure from [facility] during today's clinic appointment.  The patient will continue to follow-up with one of my EP partners moving forward.  Follow-up 2 year with EP APP Follow-up 1 year with general cardiology   Signed, Ole Holts, MD, [facility], [facility] 02/18/2024 11:02 AM    Electrophysiology [facility]

## 2024-02-18 ENCOUNTER — Ambulatory Visit: Attending: Cardiology | Admitting: Cardiology

## 2024-02-18 ENCOUNTER — Encounter: Payer: Self-pay | Admitting: Cardiology

## 2024-02-18 VITALS — BP 132/82 | HR 72 | Ht 63.0 in | Wt 174.8 lb

## 2024-02-18 DIAGNOSIS — I480 Paroxysmal atrial fibrillation: Secondary | ICD-10-CM | POA: Diagnosis not present

## 2024-02-18 DIAGNOSIS — I10 Essential (primary) hypertension: Secondary | ICD-10-CM | POA: Diagnosis not present

## 2024-02-19 ENCOUNTER — Ambulatory Visit: Admitting: Family Medicine

## 2024-02-20 ENCOUNTER — Telehealth: Payer: Self-pay

## 2024-02-20 NOTE — Telephone Encounter (Unsigned)
 At last coumadin  clinic apt pt reported she is scheduled for d&c and hysteroscopy for 1/20. Advised would review her chart and discuss with PCP if Lovenox  bridge is needed.  Next coumadin  clinic apt is 1/6.  Indication: paroxysmal Afib, DVT x 2 (2001 & 2002), PE 2001  Recommendation is for a Lovenox  bridge around surgery. CHA2DS2-VASc Score = 2   Actual wt: 79.3 kg (>151% ideal wt; use adjusted wt for CrCl calculation) Ideal wt: 52.4 kg Adjusted wt: 63.1 kg CrCl: 54.49 mL/min  Recommended Lovenox  dose 120 mg QD  Lovenox  bridge schedule;  1/15: Take last dose of warfarin 1/16: NO warfarin, NO Lovenox  1/17: NO warfarin, inject Lovenox  once in the AM 1/18: NO warfarin, inject Lovenox  once in the AM 1/19: NO  warfarin, inject Lovenox  once in the AM BEFORE 7 AM  1/20: SURGERY; NO WARFARIN, NO LOVENOX   1/21: Take 2 tablets (10  mg) warfarin, inject Lovenox  once in the AM 1/22: Take 1 1/2 tablets (7.5 mg) warfarin, inject Lovenox  once in the AM 1/23: Take 1 1/2 tablets (7.5 mg) warfarin, inject Lovenox  once in the AM 1/24: Take 1 1/2 tablets (7.5 mg) warfarin, inject Lovenox  once in the AM 1/25: Take 1 1/2 tablets (7.5 mg) warfarin, inject Lovenox  once in the AM 1/26: Take 1 tablet (5 mg) warfarin, inject Lovenox  once in the AM 1/27: RECHECK INR; NO WARFARIN AND NO LOVENOX  UNTIL INR CHECK

## 2024-02-24 ENCOUNTER — Ambulatory Visit: Admitting: Adult Health

## 2024-02-24 ENCOUNTER — Encounter: Payer: Self-pay | Admitting: Adult Health

## 2024-02-24 DIAGNOSIS — F411 Generalized anxiety disorder: Secondary | ICD-10-CM | POA: Diagnosis not present

## 2024-02-24 DIAGNOSIS — F319 Bipolar disorder, unspecified: Secondary | ICD-10-CM | POA: Diagnosis not present

## 2024-02-24 DIAGNOSIS — F09 Unspecified mental disorder due to known physiological condition: Secondary | ICD-10-CM

## 2024-02-24 DIAGNOSIS — F310 Bipolar disorder, current episode hypomanic: Secondary | ICD-10-CM

## 2024-02-24 DIAGNOSIS — G4700 Insomnia, unspecified: Secondary | ICD-10-CM

## 2024-02-24 DIAGNOSIS — F4310 Post-traumatic stress disorder, unspecified: Secondary | ICD-10-CM

## 2024-02-24 MED ORDER — LAMOTRIGINE 200 MG PO TABS: 200.0000 mg | ORAL_TABLET | Freq: Two times a day (BID) | ORAL | 1 refills | Status: DC

## 2024-02-24 MED ORDER — CLONAZEPAM 0.5 MG PO TABS: 0.5000 mg | ORAL_TABLET | Freq: Two times a day (BID) | ORAL | 0 refills | Status: DC

## 2024-02-24 NOTE — Progress Notes (Signed)
 Emily Rice [id number] 04/27/1960 63 y.o.  Subjective:   Patient ID:  Emily Rice is a 63 y.o. (DOB [birth year]) female.  Chief Complaint: No chief complaint on file.   HPI Emily Rice presents to the office today for follow-up of Bipolar effective disorder, cognitive disorder, GAD, and insomnia.    Describes mood today as ok. Reports tearfulness at times. Mood symptoms - reports more times of depression and anxiety. Reports improved interest and motivation. Reports irritability at times - overwhelmed at times. Denies recent panic attacks. Reports some worry, rumination and over thinking - more so at night. Reports some obsessive thoughts and acts. Reports recent gynecological issues. Reports mood is more variable - not as stable as it had been. Stating I feel like I'm doing alright. Feels like medications are helpful. Taking medications as prescribed.  Energy levels stable. Active, struggling with a regular exercise routine due to tiredness - joined [employer]. Enjoys some usual interests and activities. Married. Lives with husband. Has 2 grown children. Spending time with family. Appetite adequate. Weight loss - 10+ pounds working with [facility] weight loss management. Recently started on Ozempic . Sleeps better some nights than others - it's been so bad. Averages at least 12 hours. Denies daytime napping. Reports difficulties with focus and concentration (chronic issue). Completing tasks. Managing aspects of household. 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 Denies SI or HI.  Denies AH or VH. Denies self harm. Denies substance use.   CAGE-AID    Flowsheet Row ED to Hosp-Admission (Discharged) from 03/02/2020 in [facility]  CAGE-AID Score 0   GAD-7    Flowsheet Row Office Visit from 01/06/2024 in [facility] for [facility] [place]  Total GAD-7 Score 2   PHQ2-9    Flowsheet Row Office Visit from  01/06/2024 in [facility] for [facility] [place] Office Visit from 01/02/2024 in [facility] [place] Office Visit from 06/20/2023 in [facility] Clinical Support from 03/29/2023 in [facility] at [place] Office Visit from 10/05/2020 in [facility]  PHQ-2 Total Score 4 0 0 0 4  PHQ-9 Total Score 13 -- -- 0 --   Flowsheet Row Admission (Discharged) from 12/04/2023 in [facility] CARDIAC CATH LAB ED from 08/15/2023 in [facility] Emergency Department at [facility] ED to Hosp-Admission (Discharged) from 07/22/2023 in [facility]  C-SSRS RISK CATEGORY No Risk No Risk No Risk     Review of Systems:  Review of Systems  Musculoskeletal:  Negative for gait problem.  Neurological:  Negative for tremors.  Psychiatric/Behavioral:         Please refer to HPI    Medications: I have reviewed the patient's current medications.  Current Outpatient Medications  Medication Sig Dispense Refill   acetaminophen  (TYLENOL ) 325 MG tablet Take 2 tablets (650 mg total) by mouth every 6 (six) hours as needed for mild pain or moderate pain. (Patient taking differently: Take 650 mg by mouth as needed for mild pain (pain score 1-3) or moderate pain (pain score 4-6).)     acetaminophen  (TYLENOL ) 650 MG CR tablet Take 1,300 mg by mouth every 8 (eight) hours as needed for pain. (Patient taking differently: Take 1,300 mg by mouth as needed for pain.)     busPIRone  (BUSPAR ) 30 MG tablet TAKE 1 TABLET TWICE DAILY  180 tablet 1   calcium  carbonate (TUMS) 500 MG chewable tablet Chew 1 tablet (200 mg of elemental calcium  total) by mouth daily. 90 tablet 3   cetirizine (ZYRTEC) 10 MG tablet Take 10 mg by mouth daily as needed for allergies.     Cholecalciferol  75 MCG (3000 UT) TABS Take 3,000 Units by mouth daily. 30 tablet 0   clonazePAM  (KLONOPIN ) 0.5 MG  tablet Take 1 tablet (0.5 mg total) by mouth 2 (two) times daily. 60 tablet 0   diltiazem  (CARDIZEM ) 30 MG tablet TAKE 1 TABLET EVERY 4 HOURS AS NEEDED FOR AFIB HEART RATE >95 AS LONG AS TOP BP >100. (Patient not taking: Reported on 02/18/2024) 90 tablet 1   escitalopram  (LEXAPRO ) 10 MG tablet TAKE 1 TABLET AT BEDTIME 90 tablet 1   fexofenadine (ALLEGRA) 180 MG tablet Take 180 mg by mouth as needed for allergies or rhinitis.     fluticasone  (FLONASE ) 50 MCG/ACT nasal spray Place 1 spray into both nostrils daily as needed for allergies.      ibuprofen (ADVIL) 200 MG tablet Take 800 mg by mouth as needed.     Insulin  Pen Needle (B-D UF III MINI PEN NEEDLES) 31G X 5 MM MISC 1 Pen by Does not apply route daily. 30 each 0   lamoTRIgine  (LAMICTAL ) 200 MG tablet TAKE 1 TABLET TWICE DAILY 180 tablet 0   levothyroxine  (SYNTHROID ) 25 MCG tablet Take 1 tablet (25 mcg total) by mouth daily. 30 tablet 1   losartan  (COZAAR ) 25 MG tablet Take 1 tablet (25 mg total) by mouth daily. 90 tablet 3   losartan  (COZAAR ) 25 MG tablet Take 1 tablet (25 mg total) by mouth daily. 30 tablet 3   lumateperone  tosylate (CAPLYTA ) 42 MG capsule Take 1 capsule (42 mg total) by mouth daily. 90 capsule 0   metFORMIN  (GLUCOPHAGE -XR) 500 MG 24 hr tablet Take 1 tablet (500 mg total) by mouth 2 (two) times daily with a meal. 180 tablet 0   Multiple Vitamin (MULTIVITAMIN WITH MINERALS) TABS tablet Take 1 tablet by mouth daily.     ondansetron  (ZOFRAN -ODT) 4 MG disintegrating tablet Take 1 tablet (4 mg total) by mouth every 8 (eight) hours as needed. 20 tablet 6   pantoprazole  (PROTONIX ) 40 MG tablet Take 1 tablet (40 mg total) by mouth daily. 90 tablet 1   polyethylene glycol (MIRALAX  / GLYCOLAX ) 17 g packet Take 17 g by mouth 2 (two) times daily. (Patient taking differently: Take 17 g by mouth as needed for moderate constipation.) 14 each 0   potassium chloride  SA (KLOR-CON  M) 20 MEQ tablet Take 1 tablet (20 mEq total) by mouth daily. 90  tablet 3   QUEtiapine  (SEROQUEL ) 50 MG tablet TAKE 1 TO 2 TABLETS AT BEDTIME 180 tablet 1   rizatriptan  (MAXALT -MLT) 10 MG disintegrating tablet Take 1 tablet (10 mg total) by mouth as needed. May repeat in 2 hours if needed 10 tablet 6   Semaglutide ,0.25 or 0.5MG /DOS, (OZEMPIC , 0.25 OR 0.5 MG/DOSE,) 2 MG/1.5ML SOPN Inject 0.25 mg into the skin once a week. 0.75 mL 0   warfarin (COUMADIN ) 5 MG tablet TAKE 1 TABLET (5 MG) BY MOUTH DAILY EXCEPT TAKE 1 AND 1/2 TABLETS (7.5 MG) ON WEDNESD AND SUNDAY OR AS DIRECTED BY ANTICOAGULATION CLINIC 110 tablet 1   No current facility-administered medications for this visit.    Medication Side Effects: None  Allergies: Allergies[1]  Past Medical History:  Diagnosis Date   A-fib [facility])    Allergy  Anxiety    Bipolar disorder (HCC)    Chondromalacia patellae 12/14/2003   Depression    DVT (deep vein thrombosis) in pregnancy    DVT (deep venous thrombosis) (HCC) 2001 and 2002   DVT (deep venous thrombosis) (HCC)    x 2   Enlarged pancreas 07/02/2023   Fulness of pancreatic head on CT    Esophagitis 06/06/2016   GERD (gastroesophageal reflux disease)    Hemorrhage    Hyperlipidemia    Migraines    Pulmonary embolism (HCC) 2001   S/P cholecystectomy 09/11/2012   Sickle cell anemia (HCC)    Suicidal ideation 12/28/2008   Tremor     Past Medical History, Surgical history, Social history, and Family history were reviewed and updated as appropriate.   Please see review of systems for further details on the patient's review from today.   Objective:   Physical Exam:  LMP  (LMP Unknown)   Physical Exam Constitutional:      General: She is not in acute distress. Musculoskeletal:        General: No deformity.  Neurological:     Mental Status: She is alert and oriented to person, place, and time.     Coordination: Coordination normal.  Psychiatric:        Attention and Perception: Attention and perception normal. She does not perceive  auditory or visual hallucinations.        Mood and Affect: Mood normal. Mood is not anxious or depressed. Affect is not labile, blunt, angry or inappropriate.        Speech: Speech normal.        Behavior: Behavior normal.        Thought Content: Thought content normal. Thought content is not paranoid or delusional. Thought content does not include homicidal or suicidal ideation. Thought content does not include homicidal or suicidal plan.        Cognition and Memory: Cognition and memory normal.        Judgment: Judgment normal.     Comments: Insight intact     Lab Review:     Component Value Date/Time   NA 141 01/31/2024 0807   NA 141 12/17/2023 1042   K 4.2 01/31/2024 0807   CL 103 01/31/2024 0807   CO2 29 01/31/2024 0807   GLUCOSE 76 01/31/2024 0807   BUN 18 01/31/2024 0807   BUN 15 12/17/2023 1042   CREATININE 1.08 01/31/2024 0807   CALCIUM  9.0 01/31/2024 0807   PROT 7.2 01/31/2024 0807   PROT 7.2 10/01/2023 1058   ALBUMIN  4.4 01/31/2024 0807   ALBUMIN  4.4 10/01/2023 1058   AST 29 01/31/2024 0807   ALT 29 01/31/2024 0807   ALKPHOS 51 01/31/2024 0807   BILITOT 0.4 01/31/2024 0807   BILITOT 0.3 10/01/2023 1058   GFRNONAA >60 08/15/2023 1432   GFRAA >60 12/10/2019 1600       Component Value Date/Time   WBC 5.8 01/02/2024 0946   RBC 5.04 01/02/2024 0946   HGB 13.0 01/02/2024 0946   HGB 12.9 11/13/2023 1037   HCT 41.1 01/02/2024 0946   HCT 41.2 11/13/2023 1037   PLT 175.0 01/02/2024 0946   PLT 182 11/13/2023 1037   MCV 81.5 01/02/2024 0946   MCV 82 11/13/2023 1037   MCH 25.7 (L) 11/13/2023 1037   MCH 26.9 08/15/2023 1432   MCHC 31.6 01/02/2024 0946   RDW 17.7 (H) 01/02/2024 0946   RDW 15.9 (H) 11/13/2023 1037   LYMPHSABS 2.2 01/02/2024 0946  MONOABS 0.4 01/02/2024 0946   EOSABS 0.2 01/02/2024 0946   BASOSABS 0.0 01/02/2024 0946    No results found for: POCLITH, LITHIUM   No results found for: PHENYTOIN, PHENOBARB, VALPROATE, CBMZ    .res Assessment: Plan:    Plan:  PDMP reviewed  Continue:  Seroquel  100mg  at bedtime   Clonazepam  0.5mg  BID  Caplyta  42mg  daily - using samples due to cost Lamictal  200mg  twice daily Lexapro  10mg  daily Buspar  30mg  BID for anxiety - dose increase  RTC 4 weeks  25 minutes spent dedicated to the care of this patient on the date of this encounter to include pre-visit review of records, ordering of medication, post visit documentation, and face-to-face time with the patient discussing Bipolar effective disorder, cognitive disorder, GAD, and insomnia. Discussed continuing current medication regimen. Also spoke with patient's husband about her current medications and treatment.   Seeing therapist - Marval Bunde  Patient advised to contact office with any questions, adverse effects, or acute worsening in signs and symptoms.   Discussed potential benefits, risk, and side effects of benzodiazepines to include potential risk of tolerance and dependence, as well as possible drowsiness. Advised patient not to drive if experiencing drowsiness and to take lowest possible effective dose to minimize risk of dependence and tolerance.   Discussed potential metabolic side effects associated with atypical antipsychotics, as well as potential risk for movement side effects. Advised pt to contact office if movement side effects occur.    There are no diagnoses linked to this encounter.   Please see After Visit Summary for patient specific instructions.  Future Appointments  Date Time Provider Department Center  02/24/2024  8:30 AM Jonan Seufert, Angeline Mattocks, NP CP-CP None  03/17/2024 10:30 AM [facility] COUMADIN  CLINIC [facility]  03/17/2024 11:20 AM Lendia, Vickie L, NP-C [facility]  03/19/2024  9:00 AM Francyne Romano, MD [facility] None  03/26/2024  8:30 AM [facility] MM 2 GI-BCGMM GI-BREAST CE  03/26/2024  9:30 AM [facility]-ANNUAL WELLNESS VISIT [facility]  08/11/2024   8:00 AM Henson, Vickie L, NP-C [facility]    No orders of the defined types were placed in this encounter.   -------------------------------     [1]  Allergies Allergen Reactions   Hydromorphone  Other (See Comments)    Stroke-like symptoms. treated with Narcan; Tolerated Percocet in the past.   Iodinated Contrast Media Other (See Comments)    Reaction not fully known   Prochlorperazine Swelling and Other (See Comments)    Compazine - tongue swelling

## 2024-02-27 ENCOUNTER — Other Ambulatory Visit: Payer: Self-pay | Admitting: Family Medicine

## 2024-02-28 ENCOUNTER — Encounter: Admitting: Family Medicine

## 2024-02-28 ENCOUNTER — Ambulatory Visit: Admitting: Pulmonary Disease

## 2024-03-03 ENCOUNTER — Other Ambulatory Visit: Payer: Self-pay | Admitting: Cardiology

## 2024-03-06 ENCOUNTER — Ambulatory Visit: Admitting: Physician Assistant

## 2024-03-06 ENCOUNTER — Ambulatory Visit

## 2024-03-08 ENCOUNTER — Other Ambulatory Visit (INDEPENDENT_AMBULATORY_CARE_PROVIDER_SITE_OTHER): Payer: Self-pay | Admitting: Internal Medicine

## 2024-03-08 DIAGNOSIS — R7303 Prediabetes: Secondary | ICD-10-CM

## 2024-03-09 ENCOUNTER — Ambulatory Visit (INDEPENDENT_AMBULATORY_CARE_PROVIDER_SITE_OTHER): Admitting: Internal Medicine

## 2024-03-11 ENCOUNTER — Other Ambulatory Visit: Payer: Self-pay | Admitting: Adult Health

## 2024-03-11 DIAGNOSIS — F310 Bipolar disorder, current episode hypomanic: Secondary | ICD-10-CM

## 2024-03-13 ENCOUNTER — Other Ambulatory Visit

## 2024-03-16 ENCOUNTER — Telehealth: Payer: Self-pay

## 2024-03-16 ENCOUNTER — Ambulatory Visit: Payer: Self-pay

## 2024-03-16 ENCOUNTER — Telehealth: Payer: Self-pay | Admitting: Adult Health

## 2024-03-16 NOTE — Telephone Encounter (Signed)
 Called pt and she would prefer just labs done for TSH- you have one note to me on last labs that she needs to f/u w you but then your message to her just says she needs repeat labs. Please clarify if this require ov?

## 2024-03-16 NOTE — Telephone Encounter (Signed)
 FYI Only or Action Required?: Action required by provider: Refusing further triage, canceled physical appt for tomorrow morning, pt requesting to cancel Coumadin  Clinic appt for tomorrow morning. Pt awaiting call back from psychiatrist.  Patient was last seen in primary care on 01/30/2024 by Francyne Romano, MD.  Called Nurse Triage reporting Anxiety.  Symptoms began several days ago.  Interventions attempted: Other: called psychiatrist.  Symptoms are: gradually worsening.  Triage Disposition: See PCP When Office is Open (Within 3 Days)  Patient/caregiver understands and will follow disposition?: No, refuses disposition    Copied from CRM [id number]. Topic: Clinical - Red Word Triage >> Mar 16, 2024  1:13 PM Thersia BROCKS wrote: [facility] that prompted transfer to Nurse Triage: Patient called in stated she isnt doing well, having racing thoughts, having a lot of anxiety Had appt scheduled for tomorrow but needs to cancel appt, PAS did not cancel appt yet Reason for Disposition  MODERATE anxiety (e.g., persistent or frequent anxiety symptoms; interferes with sleep, school, or work)  Answer Assessment - Initial Assessment Questions Pt refused further triage, pt requesting to cancel appts for tomorrow for physical and coumadin  clinic. This RN canceled physical appt for tomorrow, sending message to PCP office for canceling the coumadin  clinic. Pt states she will call back when she feels like she can handle making it to appts. Pt states that she has a call out to her psychiatrist and is waiting for a call back. Advised pt call back or go to ED if feel like she cannot calm down/cope/function after her usual interventions or if she feels SOB, chest pain, or feels unsafe. Pt verbalized understanding.    Was calling to cancel appt for tomorrow Physical scheduled for tomorrow, won't be able to make appt due to issues I'm calling about Not feeling able to make appt Have to take transportation service  to get to appt Don't feel organized in my thinking enough to get to that appt, to catch that fleeta, wait in that waiting room, all too overwhelming right now Off and on for the past week or so It's gotten worse this weekend since Friday Where not able to sleep When do sleep I'm not able to, hard for me to awaken then when I am up, then it's scattered thinking all the other symptoms come back Panic attacks No problems breathing Headaches/migraines, no disorientation Oriented to person place time fine in that way Not in danger of hurting self or others Have a call in to psychiatrist, waiting for call back, waiting to hear from them Not calling to explain, just calling to let her know that can't make appt tomorrow Not wanting to continue with triage Both of those appts are so important, just can't make it tomorrow, will call back as soon  Protocols used: Anxiety and Panic Attack-A-AH

## 2024-03-16 NOTE — Telephone Encounter (Signed)
 Pt stating she is having a hard time and needs to speak with [place]

## 2024-03-16 NOTE — Telephone Encounter (Signed)
 Pt seen 12/15: Seroquel  100mg  at bedtime    Clonazepam  0.5mg  BID   Caplyta  42mg  daily - using samples due to cost Lamictal  200mg  twice daily Lexapro  10mg  daily Buspar  30mg  BID for anxiety - dose increase   RTC 4 weeks  She reports taking all medications as prescribed. Feels like she is on so much medication and she is worried about what there is to offer her going forward.  She is reporting she feels out of my mind. Having PA, unorganized thinking, racing thoughts. She is irritable and irate. Rates anxiety as 8/10, depression at 5/10. Denied new stressors, but is having a GYN procedure in a couple of weeks and said it is causing some anxiety. Trouble sleeping - both getting to sleep and staying asleep. Estimates getting 3-4 hours a night. Denies SI/HI. Thoughts are scattered, speech a little slow, but she isn't demonstrating irritability as she has had in the past.

## 2024-03-16 NOTE — Telephone Encounter (Signed)
 Copied from CRM #[id number]. Topic: Clinical - Request for Lab/Test Order >> Mar 16, 2024  2:36 PM Berneda FALCON wrote: Reason for CRM: Patient would like labs to recheck numbers after taking synthroid  for her thyroid  levels. Prefers next Tuesday for lab if possible

## 2024-03-16 NOTE — Telephone Encounter (Signed)
 Pt requested to RS her coumadin  clinic apt until next week due to elevated anxiety and thought racing. She reported she has contacted her behavioral health team with her concerns today. They have not responded yet. RS coumadin  clinic apt for pt. Advised she will be given instructions next week for medication management around her surgery. Pt verbalized understanding.

## 2024-03-17 ENCOUNTER — Encounter: Admitting: Family Medicine

## 2024-03-17 ENCOUNTER — Ambulatory Visit

## 2024-03-17 ENCOUNTER — Other Ambulatory Visit: Payer: Self-pay | Admitting: Family Medicine

## 2024-03-17 DIAGNOSIS — R7989 Other specified abnormal findings of blood chemistry: Secondary | ICD-10-CM

## 2024-03-17 NOTE — Telephone Encounter (Signed)
 LM for pt lab orders placed and no ov needed. Provided office number for any questions

## 2024-03-18 ENCOUNTER — Encounter (INDEPENDENT_AMBULATORY_CARE_PROVIDER_SITE_OTHER): Payer: Self-pay | Admitting: Internal Medicine

## 2024-03-18 NOTE — Telephone Encounter (Signed)
 Pt has called twice today. She lists off all of the previously described sx.  She denied they are related to lack of sleep.  She talks over me when I try to ask questions.  Has FU 1/12 and said she can't wait until then.

## 2024-03-19 ENCOUNTER — Ambulatory Visit (INDEPENDENT_AMBULATORY_CARE_PROVIDER_SITE_OTHER): Admitting: Internal Medicine

## 2024-03-19 NOTE — Telephone Encounter (Signed)
 Emily Rice reporting no earlier appt available and when she called patient to let her know she did not answer.

## 2024-03-23 ENCOUNTER — Other Ambulatory Visit (INDEPENDENT_AMBULATORY_CARE_PROVIDER_SITE_OTHER): Payer: Self-pay

## 2024-03-23 ENCOUNTER — Telehealth: Admitting: Adult Health

## 2024-03-23 ENCOUNTER — Encounter: Payer: Self-pay | Admitting: Adult Health

## 2024-03-23 ENCOUNTER — Ambulatory Visit: Admitting: Adult Health

## 2024-03-23 DIAGNOSIS — F09 Unspecified mental disorder due to known physiological condition: Secondary | ICD-10-CM

## 2024-03-23 DIAGNOSIS — G4700 Insomnia, unspecified: Secondary | ICD-10-CM | POA: Diagnosis not present

## 2024-03-23 DIAGNOSIS — R7303 Prediabetes: Secondary | ICD-10-CM

## 2024-03-23 DIAGNOSIS — F310 Bipolar disorder, current episode hypomanic: Secondary | ICD-10-CM

## 2024-03-23 DIAGNOSIS — F411 Generalized anxiety disorder: Secondary | ICD-10-CM | POA: Diagnosis not present

## 2024-03-23 MED ORDER — TRAZODONE HCL 50 MG PO TABS: ORAL_TABLET | ORAL | 2 refills | Status: AC

## 2024-03-23 MED ORDER — CLONAZEPAM 0.5 MG PO TABS: 0.5000 mg | ORAL_TABLET | Freq: Two times a day (BID) | ORAL | 0 refills | Status: AC

## 2024-03-23 NOTE — Progress Notes (Signed)
 Emily Rice [id number] Jun 16, 1960 64 y.o.  Virtual Visit via Video Note  I connected with pt @ on 03/23/2024 at  9:00 AM EST by a video enabled telemedicine application and verified that I am speaking with the correct person using two identifiers.   I discussed the limitations of evaluation and management by telemedicine and the availability of in person appointments. The patient expressed understanding and agreed to proceed.  I discussed the assessment and treatment plan with the patient. The patient was provided an opportunity to ask questions and all were answered. The patient agreed with the plan and demonstrated an understanding of the instructions.   The patient was advised to call back or seek an in-person evaluation if the symptoms worsen or if the condition fails to improve as anticipated.  I provided 25 minutes of non-face-to-face time during this encounter.  The patient was located at home.  The provider was located at [facility].   Angeline LOISE Sayers, NP   Subjective:   Patient ID:  Emily Rice is a 64 y.o. (DOB [birth year]) female.  Chief Complaint: No chief complaint on file.   HPI Emily Rice presents for follow-up of Bipolar effective disorder, cognitive disorder, GAD, and insomnia.    Describes mood today as ok. Reports tearfulness at times. Mood symptoms - reports periods of depression - some of the time. Reports anxiety and irritability - more than usual - angering quickly.  Reports lower interest and motivation - no desire to get out. Reports feeling overwhelmed at times. Reports racing thoughts. Reports panic attacks - almost daily. Reports some worry, rumination and over thinking - any and everything I can think of. Reports some obsessive thoughts and acts. Reports recent gynecological issues - surgical procedure upcoming. Reports mood is variable - it's up and down. Stating I feel like I have no control over things. Feels like  medications are helpful. Taking medications as prescribed.  Energy levels lower. Active, struggling with a regular exercise routine - joined [employer]. Enjoys some usual interests and activities. Married. Lives with husband. Has 2 grown children. Spending time with family. Appetite decreased. Weight loss - working with [facility] weight loss management. Recently started on Ozempic . Sleeps better some nights than others - watch show poor quality - reports getting 3 to 4 hours for the past few nights - but 5 to 6 hours prior to that. Reports daytime napping - a few hours or so. Reports difficulties with focus and concentration (chronic issue). Completing tasks. Managing some aspects of household - not as much as usual. 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 Denies SI or HI.  Denies AH or VH. Denies self harm. Denies substance use.  Review of Systems:  Review of Systems  Musculoskeletal:  Negative for gait problem.  Neurological:  Negative for tremors.  Psychiatric/Behavioral:         Please refer to HPI   Medications: I have reviewed the patient's current medications.  Current Outpatient Medications  Medication Sig Dispense Refill   traZODone  (DESYREL ) 50 MG tablet Take one to two tablets at bedtime. 60 tablet 2   acetaminophen  (TYLENOL ) 325 MG tablet Take 2 tablets (650 mg total) by mouth every 6 (six) hours as needed for mild pain or moderate pain. (Patient taking differently: Take 650 mg by mouth as needed for mild pain (pain score 1-3) or moderate pain (pain score 4-6).)     acetaminophen  (TYLENOL ) 650 MG CR tablet Take 1,300 mg by  mouth every 8 (eight) hours as needed for pain. (Patient taking differently: Take 1,300 mg by mouth as needed for pain.)     busPIRone  (BUSPAR ) 30 MG tablet TAKE 1 TABLET TWICE DAILY 180 tablet 1   calcium  carbonate (TUMS) 500 MG chewable tablet Chew 1 tablet (200 mg of elemental calcium  total) by mouth daily. 90 tablet 3   cetirizine (ZYRTEC) 10  MG tablet Take 10 mg by mouth daily as needed for allergies.     Cholecalciferol  75 MCG (3000 UT) TABS Take 3,000 Units by mouth daily. 30 tablet 0   clonazePAM  (KLONOPIN ) 0.5 MG tablet Take 1 tablet (0.5 mg total) by mouth 2 (two) times daily. 60 tablet 0   diltiazem  (CARDIZEM ) 30 MG tablet TAKE 1 TABLET EVERY 4 HOURS AS NEEDED FOR AFIB HEART RATE >95 AS LONG AS TOP BP >100. (Patient not taking: Reported on 02/18/2024) 90 tablet 1   escitalopram  (LEXAPRO ) 10 MG tablet TAKE 1 TABLET AT BEDTIME 90 tablet 1   fexofenadine (ALLEGRA) 180 MG tablet Take 180 mg by mouth as needed for allergies or rhinitis.     fluticasone  (FLONASE ) 50 MCG/ACT nasal spray Place 1 spray into both nostrils daily as needed for allergies.      ibuprofen (ADVIL) 200 MG tablet Take 800 mg by mouth as needed.     Insulin  Pen Needle (B-D UF III MINI PEN NEEDLES) 31G X 5 MM MISC 1 Pen by Does not apply route daily. 30 each 0   lamoTRIgine  (LAMICTAL ) 200 MG tablet TAKE 1 TABLET TWICE DAILY 180 tablet 1   levothyroxine  (SYNTHROID ) 25 MCG tablet TAKE 1 TABLET BY MOUTH EVERY DAY 90 tablet 1   losartan  (COZAAR ) 25 MG tablet Take 1 tablet (25 mg total) by mouth daily. 90 tablet 3   losartan  (COZAAR ) 25 MG tablet Take 1 tablet (25 mg total) by mouth daily. 90 tablet 3   lumateperone  tosylate (CAPLYTA ) 42 MG capsule Take 1 capsule (42 mg total) by mouth daily. 90 capsule 0   metFORMIN  (GLUCOPHAGE -XR) 500 MG 24 hr tablet Take 1 tablet (500 mg total) by mouth 2 (two) times daily with a meal. 180 tablet 0   Multiple Vitamin (MULTIVITAMIN WITH MINERALS) TABS tablet Take 1 tablet by mouth daily.     ondansetron  (ZOFRAN -ODT) 4 MG disintegrating tablet Take 1 tablet (4 mg total) by mouth every 8 (eight) hours as needed. 20 tablet 6   pantoprazole  (PROTONIX ) 40 MG tablet Take 1 tablet (40 mg total) by mouth daily. 90 tablet 1   polyethylene glycol (MIRALAX  / GLYCOLAX ) 17 g packet Take 17 g by mouth 2 (two) times daily. (Patient taking differently:  Take 17 g by mouth as needed for moderate constipation.) 14 each 0   potassium chloride  SA (KLOR-CON  M) 20 MEQ tablet Take 1 tablet (20 mEq total) by mouth daily. 90 tablet 3   QUEtiapine  (SEROQUEL ) 50 MG tablet TAKE 1 TO 2 TABLETS AT BEDTIME 180 tablet 1   rizatriptan  (MAXALT -MLT) 10 MG disintegrating tablet Take 1 tablet (10 mg total) by mouth as needed. May repeat in 2 hours if needed 10 tablet 6   Semaglutide ,0.25 or 0.5MG /DOS, (OZEMPIC , 0.25 OR 0.5 MG/DOSE,) 2 MG/1.5ML SOPN Inject 0.25 mg into the skin once a week. 0.75 mL 0   warfarin (COUMADIN ) 5 MG tablet TAKE 1 TABLET (5 MG) BY MOUTH DAILY EXCEPT TAKE 1 AND 1/2 TABLETS (7.5 MG) ON WEDNESD AND SUNDAY OR AS DIRECTED BY ANTICOAGULATION CLINIC 110 tablet 1  No current facility-administered medications for this visit.    Medication Side Effects: None  Allergies: Allergies[1]  Past Medical History:  Diagnosis Date   A-fib (HCC)    Allergy    Anxiety    Bipolar disorder (HCC)    Chondromalacia patellae 12/14/2003   Depression    DVT (deep vein thrombosis) in pregnancy    DVT (deep venous thrombosis) (HCC) 2001 and 2002   DVT (deep venous thrombosis) (HCC)    x 2   Enlarged pancreas 07/02/2023   Fulness of pancreatic head on CT    Esophagitis 06/06/2016   GERD (gastroesophageal reflux disease)    Hemorrhage    Hyperlipidemia    Migraines    Pulmonary embolism (HCC) 2001   S/P cholecystectomy 09/11/2012   Sickle cell anemia (HCC)    Suicidal ideation 12/28/2008   Tremor     Family History  Problem Relation Age of Onset   Hypertension Mother    Stroke Father    Hypertension Father    Atrial fibrillation Father    Heart disease Father    Diabetes Sister    CAD Brother    Anxiety disorder Daughter    ADD / ADHD Son    Depression Son    Hypertension Maternal Uncle    Liver disease Neg Hx    Esophageal cancer Neg Hx    Colon cancer Neg Hx     Social History   Socioeconomic History   Marital status: Married     Spouse name: Marine Scientist   Number of children: 2   Years of education: college   Highest education level: Master's degree (e.g., MA, MS, MEng, MEd, MSW, MBA)  Occupational History   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   Occupation: retired  Tobacco Use   Smoking status: Never   Smokeless tobacco: Never   Tobacco comments:    Never smoked 09/20/23  Vaping Use   Vaping status: Never Used  Substance and Sexual Activity   Alcohol use: Never    Alcohol/week: 2.0 standard drinks of alcohol    Types: 2 Standard drinks or equivalent per week    Comment: occasional   Drug use: Not Currently    Frequency: 7.0 times per week    Types: Benzodiazepines   Sexual activity: Yes    Birth control/protection: Post-menopausal  Other Topics Concern   Not on file  Social History Narrative   ** Merged History Encounter ** Lives with husband.   Right-handed.   No daily use of caffeine .   Lives with husband.-2025      Social Drivers of Health   Tobacco Use: Low Risk (03/23/2024)   Patient History    Smoking Tobacco Use: Never    Smokeless Tobacco Use: Never    Passive Exposure: Not on file  Financial Resource Strain: Low Risk (12/21/2023)   Overall Financial Resource Strain (CARDIA)    Difficulty of Paying Living Expenses: Not very hard  Food Insecurity: Food Insecurity Present (12/21/2023)   Epic    Worried About Programme Researcher, Broadcasting/film/video in the Last Year: Never true    Ran Out of Food in the Last Year: Sometimes true  Transportation Needs: No Transportation Needs (12/21/2023)   Epic    Lack of Transportation (Medical): No    Lack of Transportation (Non-Medical): No  Physical Activity: Insufficiently Active (12/21/2023)   Exercise Vital Sign    Days of Exercise per Week: 4 days    Minutes of Exercise per Session: 30 min  Stress: No Stress Concern Present (12/21/2023)   [employer] of Occupational Health - Occupational Stress Questionnaire    Feeling of Stress: Only a little  Social  Connections: Moderately Integrated (12/21/2023)   Social Connection and Isolation Panel    Frequency of Communication with Friends and Family: More than three times a week    Frequency of Social Gatherings with Friends and Family: Never    Attends Religious Services: More than 4 times per year    Active Member of [employer] or Organizations: No    Attends Banker Meetings: Not on file    Marital Status: Married  Catering Manager Violence: Not At Risk (07/24/2023)   Humiliation, Afraid, Rape, and Kick questionnaire    Fear of Current or Ex-Partner: No    Emotionally Abused: No    Physically Abused: No    Sexually Abused: No  Depression (PHQ2-9): High Risk (01/06/2024)   Depression (PHQ2-9)    PHQ-2 Score: 13  Alcohol Screen: Low Risk (12/21/2023)   Alcohol Screen    Last Alcohol Screening Score (AUDIT): 0  Housing: Low Risk (12/21/2023)   Epic    Unable to Pay for Housing in the Last Year: No    Number of Times Moved in the Last Year: 0    Homeless in the Last Year: No  Utilities: Not At Risk (07/24/2023)   AHC Utilities    Threatened with loss of utilities: No  Health Literacy: Adequate Health Literacy (03/29/2023)   B1300 Health Literacy    Frequency of need for help with medical instructions: Never    Past Medical History, Surgical history, Social history, and Family history were reviewed and updated as appropriate.   Please see review of systems for further details on the patient's review from today.   Objective:   Physical Exam:  LMP  (LMP Unknown)   Physical Exam Constitutional:      General: She is not in acute distress. Musculoskeletal:        General: No deformity.  Neurological:     Mental Status: She is alert and oriented to person, place, and time.     Coordination: Coordination normal.  Psychiatric:        Attention and Perception: Attention and perception normal. She does not perceive auditory or visual hallucinations.        Mood and Affect: Mood  normal. Mood is not anxious or depressed. Affect is not labile, blunt, angry or inappropriate.        Speech: Speech normal.        Behavior: Behavior normal.        Thought Content: Thought content normal. Thought content is not paranoid or delusional. Thought content does not include homicidal or suicidal ideation. Thought content does not include homicidal or suicidal plan.        Cognition and Memory: Cognition and memory normal.        Judgment: Judgment normal.     Comments: Insight intact     Lab Review:     Component Value Date/Time   NA 141 01/31/2024 0807   NA 141 12/17/2023 1042   K 4.2 01/31/2024 0807   CL 103 01/31/2024 0807   CO2 29 01/31/2024 0807   GLUCOSE 76 01/31/2024 0807   BUN 18 01/31/2024 0807   BUN 15 12/17/2023 1042   CREATININE 1.08 01/31/2024 0807   CALCIUM  9.0 01/31/2024 0807   PROT 7.2 01/31/2024 0807   PROT 7.2 10/01/2023 1058   ALBUMIN  4.4 01/31/2024 9192  ALBUMIN  4.4 10/01/2023 1058   AST 29 01/31/2024 0807   ALT 29 01/31/2024 0807   ALKPHOS 51 01/31/2024 0807   BILITOT 0.4 01/31/2024 0807   BILITOT 0.3 10/01/2023 1058   GFRNONAA >60 08/15/2023 1432   GFRAA >60 12/10/2019 1600       Component Value Date/Time   WBC 5.8 01/02/2024 0946   RBC 5.04 01/02/2024 0946   HGB 13.0 01/02/2024 0946   HGB 12.9 11/13/2023 1037   HCT 41.1 01/02/2024 0946   HCT 41.2 11/13/2023 1037   PLT 175.0 01/02/2024 0946   PLT 182 11/13/2023 1037   MCV 81.5 01/02/2024 0946   MCV 82 11/13/2023 1037   MCH 25.7 (L) 11/13/2023 1037   MCH 26.9 08/15/2023 1432   MCHC 31.6 01/02/2024 0946   RDW 17.7 (H) 01/02/2024 0946   RDW 15.9 (H) 11/13/2023 1037   LYMPHSABS 2.2 01/02/2024 0946   MONOABS 0.4 01/02/2024 0946   EOSABS 0.2 01/02/2024 0946   BASOSABS 0.0 01/02/2024 0946    No results found for: POCLITH, LITHIUM   No results found for: PHENYTOIN, PHENOBARB, VALPROATE, CBMZ   .res Assessment: Plan:    Plan:  PDMP reviewed  Change: D/C  Seroquel  100mg  at bedtime  Add Trazadone 50mg  to 100mg  at hs  Continue: Clonazepam  0.5mg  BID Caplyta  42mg  daily - using samples due to cost Lamictal  200mg  twice daily Lexapro  10mg  daily Buspar  30mg  BID for anxiety   RTC 4 weeks  25 minutes spent dedicated to the care of this patient on the date of this encounter to include pre-visit review of records, ordering of medication, post visit documentation, and face-to-face time with the patient discussing Bipolar effective disorder, cognitive disorder, GAD, and insomnia. Discussed continuing current medication regimen. Also spoke with patient's husband about her current medications and treatment.   Seeing therapist - Marval Bunde  Patient advised to contact office with any questions, adverse effects, or acute worsening in signs and symptoms.   Discussed potential benefits, risk, and side effects of benzodiazepines to include potential risk of tolerance and dependence, as well as possible drowsiness. Advised patient not to drive if experiencing drowsiness and to take lowest possible effective dose to minimize risk of dependence and tolerance.   Discussed potential metabolic side effects associated with atypical antipsychotics, as well as potential risk for movement side effects. Advised pt to contact office if movement side effects occur.    Diagnoses and all orders for this visit:  Bipolar affective disorder, current episode hypomanic (HCC)  Insomnia, unspecified type -     traZODone  (DESYREL ) 50 MG tablet; Take one to two tablets at bedtime.  Generalized anxiety disorder -     clonazePAM  (KLONOPIN ) 0.5 MG tablet; Take 1 tablet (0.5 mg total) by mouth 2 (two) times daily.  Cognitive disorder     Please see After Visit Summary for patient specific instructions.  Future Appointments  Date Time Provider Department Center  03/24/2024 10:00 AM [facility] COUMADIN  CLINIC [facility]  03/26/2024  8:30 AM [facility] MM 2 [facility]  [facility]  03/26/2024  9:30 AM [facility]-ANNUAL WELLNESS VISIT [facility]  04/02/2024 12:20 PM Francyne Romano, MD [facility] None  08/11/2024  8:00 AM Lendia Boby CROME, NP-C [facility]  01/06/2025  8:20 AM Lendia, Boby CROME, NP-C [facility]    No orders of the defined types were placed in this encounter.     -------------------------------      [1]  Allergies Allergen Reactions  Hydromorphone  Other (See Comments)    Stroke-like symptoms. treated with Narcan; Tolerated Percocet in the past.   Iodinated Contrast Media Other (See Comments)    Reaction not fully known   Prochlorperazine Swelling and Other (See Comments)    Compazine - tongue swelling

## 2024-03-24 ENCOUNTER — Other Ambulatory Visit (INDEPENDENT_AMBULATORY_CARE_PROVIDER_SITE_OTHER): Payer: Self-pay

## 2024-03-24 ENCOUNTER — Ambulatory Visit

## 2024-03-24 ENCOUNTER — Telehealth: Payer: Self-pay

## 2024-03-24 DIAGNOSIS — Z6831 Body mass index (BMI) 31.0-31.9, adult: Secondary | ICD-10-CM

## 2024-03-24 DIAGNOSIS — R7303 Prediabetes: Secondary | ICD-10-CM

## 2024-03-24 MED ORDER — SEMAGLUTIDE(0.25 OR 0.5MG/DOS) 2 MG/3ML ~~LOC~~ SOPN: 0.5000 mg | PEN_INJECTOR | SUBCUTANEOUS | 0 refills | Status: AC

## 2024-03-24 NOTE — Telephone Encounter (Signed)
 Pt cancelled her coumadin  clinic apt today. She is requesting to RS for Friday, 1/16. She is scheduled for surgery on 1/20 and will be placed on a Lovenox  bridge.   Advised of Lovenox  bridge and not to take warfarin on Friday, 1/16. Advised instructions for Lovenox  will be given at that apt. Pt verbalized understanding.

## 2024-03-25 ENCOUNTER — Encounter (INDEPENDENT_AMBULATORY_CARE_PROVIDER_SITE_OTHER): Payer: Self-pay

## 2024-03-25 ENCOUNTER — Encounter: Payer: Self-pay | Admitting: Family Medicine

## 2024-03-25 ENCOUNTER — Telehealth (INDEPENDENT_AMBULATORY_CARE_PROVIDER_SITE_OTHER): Payer: Self-pay | Admitting: Internal Medicine

## 2024-03-25 NOTE — Telephone Encounter (Signed)
 Received call from Octaviano at [facility] prior auth dept and they were calling about the ozempic  prescription. Please call them back

## 2024-03-25 NOTE — Telephone Encounter (Signed)
 Emily Rice called for me, on the phone for 28 min and three different people. Pts deductible is 662.00 and then her medication is covered. Sent her a message.

## 2024-03-26 ENCOUNTER — Ambulatory Visit
Admission: RE | Admit: 2024-03-26 | Discharge: 2024-03-26 | Disposition: A | Source: Ambulatory Visit | Attending: Obstetrics and Gynecology | Admitting: Obstetrics and Gynecology

## 2024-03-26 ENCOUNTER — Ambulatory Visit

## 2024-03-26 VITALS — BP 116/62 | HR 64 | Temp 97.6°F | Ht 63.0 in | Wt 172.8 lb

## 2024-03-26 DIAGNOSIS — Z0000 Encounter for general adult medical examination without abnormal findings: Secondary | ICD-10-CM | POA: Diagnosis not present

## 2024-03-26 DIAGNOSIS — Z01419 Encounter for gynecological examination (general) (routine) without abnormal findings: Secondary | ICD-10-CM

## 2024-03-26 NOTE — Patient Instructions (Signed)
 Ms. Babineau,  Thank you for taking the time for your Medicare Wellness Visit. I appreciate your continued commitment to your health goals. Please review the care plan we discussed, and feel free to reach out if I can assist you further.  Please note that Annual Wellness Visits do not include a physical exam. Some assessments may be limited, especially if the visit was conducted virtually. If needed, we may recommend an in-person follow-up with your provider.  Ongoing Care Seeing your primary care provider every 3 to 6 months helps us  monitor your health and provide consistent, personalized care.   Referrals If a referral was made during today's visit and you haven't received any updates within two weeks, please contact the referred provider directly to check on the status.  Recommended Screenings:  Health Maintenance  Topic Date Due   Pneumococcal Vaccine for age over 32 (3 of 3 - PCV) 09/03/2013   Medicare Annual Wellness Visit  03/28/2024   COVID-19 Vaccine (5 - Pfizer risk 2025-26 season) 07/03/2024   Breast Cancer Screening  02/05/2025   Colon Cancer Screening  06/30/2026   Pap with HPV screening  01/05/2029   DTaP/Tdap/Td vaccine (3 - Td or Tdap) 03/02/2030   Flu Shot  Completed   Hepatitis B Vaccine  Completed   HPV Vaccine (No Doses Required) Completed   Hepatitis C Screening  Completed   HIV Screening  Completed   Meningitis B Vaccine  Aged Out   Zoster (Shingles) Vaccine  Discontinued       03/23/2024   12:22 PM  Advanced Directives  Does Patient Have a Medical Advance Directive? No  Would patient like information on creating a medical advance directive? No - Patient declined    Vision: Annual vision screenings are recommended for early detection of glaucoma, cataracts, and diabetic retinopathy. These exams can also reveal signs of chronic conditions such as diabetes and high blood pressure.  Dental: Annual dental screenings help detect early signs of oral cancer,  gum disease, and other conditions linked to overall health, including heart disease and diabetes.  Please see the attached documents for additional preventive care recommendations.

## 2024-03-26 NOTE — Progress Notes (Signed)
 "  Chief Complaint  Patient presents with   Medicare Wellness     Subjective:   Emily Rice is a 64 y.o. female who presents for a Medicare Annual Wellness Visit.  Visit info / Clinical Intake: Medicare Wellness Visit Type:: Subsequent Annual Wellness Visit Persons participating in visit and providing information:: patient Medicare Wellness Visit Mode:: In-person (required for WTM) Interpreter Needed?: No Pre-visit prep was completed: yes AWV questionnaire completed by patient prior to visit?: yes Date:: 03/23/24 Living arrangements:: (Patient-Rptd) lives with spouse/significant other Patient's Overall Health Status Rating: (Patient-Rptd) good Typical amount of pain: (Patient-Rptd) some Does pain affect daily life?: (!) (Patient-Rptd) yes Are you currently prescribed opioids?: no  Dietary Habits and Nutritional Risks How many meals a day?: (Patient-Rptd) 3 Eats fruit and vegetables daily?: (Patient-Rptd) yes Most meals are obtained by: (Patient-Rptd) preparing own meals In the last 2 weeks, have you had any of the following?: (!) nausea, vomiting, diarrhea (nausea past couple weeks)  Functional Status Activities of Daily Living (to include ambulation/medication): (Patient-Rptd) Independent Ambulation: (Patient-Rptd) Independent Home Assistive Devices/Equipment: Eyeglasses Medication Administration: (Patient-Rptd) Independent Home Management (perform basic housework or laundry): (Patient-Rptd) Independent Manage your own finances?: (!) (Patient-Rptd) no Primary transportation is: (Patient-Rptd) family / friends Concerns about vision?: no *vision screening is required for WTM* Concerns about hearing?: no  Fall Screening Falls in the past year?: (Patient-Rptd) 0 Number of falls in past year: 0 Was there an injury with Fall?: 0 Fall Risk Category Calculator: 0 Patient Fall Risk Level: Low Fall Risk  Fall Risk Patient at Risk for Falls Due to: Medication side  effect Fall risk Follow up: Falls prevention discussed; Falls evaluation completed  Home and Transportation Safety: All rugs have non-skid backing?: (Patient-Rptd) yes All stairs or steps have railings?: (Patient-Rptd) yes Grab bars in the bathtub or shower?: (Patient-Rptd) yes Have non-skid surface in bathtub or shower?: (Patient-Rptd) yes Good home lighting?: (Patient-Rptd) yes Regular seat belt use?: (Patient-Rptd) yes Hospital stays in the last year:: (!) (Patient-Rptd) yes How many hospital stays:: 3 (cardiac issues)  Cognitive Assessment Difficulty concentrating, remembering, or making decisions? : (Patient-Rptd) yes Will 6CIT or Mini Cog be Completed: yes What year is it?: 0 points What month is it?: 0 points Give patient an address phrase to remember (5 components): [address] [place] About what time is it?: 0 points Count backwards from 20 to 1: 0 points Say the months of the year in reverse: 0 points Repeat the address phrase from earlier: 0 points 6 CIT Score: 0 points  Advance Directives (For Healthcare) Does Patient Have a Medical Advance Directive?: No Would patient like information on creating a medical advance directive?: No - Patient declined  Reviewed/Updated  Reviewed/Updated: Reviewed All (Medical, Surgical, Family, Medications, Allergies, Care Teams, Patient Goals)    Allergies (verified) Hydromorphone , Iodinated contrast media, and Prochlorperazine   Current Medications (verified) Outpatient Encounter Medications as of 03/26/2024  Medication Sig   acetaminophen  (TYLENOL ) 325 MG tablet Take 2 tablets (650 mg total) by mouth every 6 (six) hours as needed for mild pain or moderate pain. (Patient taking differently: Take 650 mg by mouth as needed for mild pain (pain score 1-3) or moderate pain (pain score 4-6).)   acetaminophen  (TYLENOL ) 650 MG CR tablet Take 1,300 mg by mouth every 8 (eight) hours as needed for pain. (Patient taking differently: Take  1,300 mg by mouth as needed for pain.)   busPIRone  (BUSPAR ) 30 MG tablet TAKE 1 TABLET TWICE DAILY  calcium  carbonate (TUMS) 500 MG chewable tablet Chew 1 tablet (200 mg of elemental calcium  total) by mouth daily.   cetirizine (ZYRTEC) 10 MG tablet Take 10 mg by mouth daily as needed for allergies.   Cholecalciferol  75 MCG (3000 UT) TABS Take 3,000 Units by mouth daily.   clonazePAM  (KLONOPIN ) 0.5 MG tablet Take 1 tablet (0.5 mg total) by mouth 2 (two) times daily.   escitalopram  (LEXAPRO ) 10 MG tablet TAKE 1 TABLET AT BEDTIME   fexofenadine (ALLEGRA) 180 MG tablet Take 180 mg by mouth as needed for allergies or rhinitis.   fluticasone  (FLONASE ) 50 MCG/ACT nasal spray Place 1 spray into both nostrils daily as needed for allergies.    ibuprofen (ADVIL) 200 MG tablet Take 800 mg by mouth as needed.   Insulin  Pen Needle (B-D UF III MINI PEN NEEDLES) 31G X 5 MM MISC 1 Pen by Does not apply route daily.   lamoTRIgine  (LAMICTAL ) 200 MG tablet TAKE 1 TABLET TWICE DAILY   levothyroxine  (SYNTHROID ) 25 MCG tablet TAKE 1 TABLET BY MOUTH EVERY DAY   losartan  (COZAAR ) 25 MG tablet Take 1 tablet (25 mg total) by mouth daily.   lumateperone  tosylate (CAPLYTA ) 42 MG capsule Take 1 capsule (42 mg total) by mouth daily.   metFORMIN  (GLUCOPHAGE -XR) 500 MG 24 hr tablet Take 1 tablet (500 mg total) by mouth 2 (two) times daily with a meal.   Multiple Vitamin (MULTIVITAMIN WITH MINERALS) TABS tablet Take 1 tablet by mouth daily.   ondansetron  (ZOFRAN -ODT) 4 MG disintegrating tablet Take 1 tablet (4 mg total) by mouth every 8 (eight) hours as needed.   pantoprazole  (PROTONIX ) 40 MG tablet Take 1 tablet (40 mg total) by mouth daily.   polyethylene glycol (MIRALAX  / GLYCOLAX ) 17 g packet Take 17 g by mouth 2 (two) times daily. (Patient taking differently: Take 17 g by mouth as needed for moderate constipation.)   rizatriptan  (MAXALT -MLT) 10 MG disintegrating tablet Take 1 tablet (10 mg total) by mouth as needed. May  repeat in 2 hours if needed   Semaglutide ,0.25 or 0.5MG /DOS, 2 MG/3ML SOPN Inject 0.5 mg into the skin once a week.   traZODone  (DESYREL ) 50 MG tablet Take one to two tablets at bedtime.   warfarin (COUMADIN ) 5 MG tablet TAKE 1 TABLET (5 MG) BY MOUTH DAILY EXCEPT TAKE 1 AND 1/2 TABLETS (7.5 MG) ON WEDNESD AND SUNDAY OR AS DIRECTED BY ANTICOAGULATION CLINIC   diltiazem  (CARDIZEM ) 30 MG tablet TAKE 1 TABLET EVERY 4 HOURS AS NEEDED FOR AFIB HEART RATE >95 AS LONG AS TOP BP >100. (Patient not taking: Reported on 03/26/2024)   losartan  (COZAAR ) 25 MG tablet Take 1 tablet (25 mg total) by mouth daily. (Patient not taking: Reported on 03/26/2024)   potassium chloride  SA (KLOR-CON  M) 20 MEQ tablet Take 1 tablet (20 mEq total) by mouth daily.   QUEtiapine  (SEROQUEL ) 50 MG tablet TAKE 1 TO 2 TABLETS AT BEDTIME (Patient not taking: Reported on 03/26/2024)   No facility-administered encounter medications on file as of 03/26/2024.    History: Past Medical History:  Diagnosis Date   A-fib (HCC)    Allergy    Anxiety    Bipolar disorder (HCC)    Chondromalacia patellae 12/14/2003   Depression    DVT (deep vein thrombosis) in pregnancy    DVT (deep venous thrombosis) (HCC) 2001 and 2002   DVT (deep venous thrombosis) (HCC)    x 2   Enlarged pancreas 07/02/2023   Fulness of pancreatic head on  CT    Esophagitis 06/06/2016   GERD (gastroesophageal reflux disease)    Hemorrhage    Hyperlipidemia    Migraines    Pulmonary embolism (HCC) 2001   S/P cholecystectomy 09/11/2012   Sickle cell anemia (HCC)    Suicidal ideation 12/28/2008   Tremor    Past Surgical History:  Procedure Laterality Date   ATRIAL FIBRILLATION ABLATION N/A 12/04/2023   Procedure: ATRIAL FIBRILLATION ABLATION;  Surgeon: Cindie Ole DASEN, MD;  Location: [facility];  Service: Cardiovascular;  Laterality: N/A;   CHOLECYSTECTOMY  2014   CHOLECYSTECTOMY     HUMERUS IM NAIL Right 03/03/2020   Procedure: INTRAMEDULLARY (IM)  NAIL HUMERAL;  Surgeon: Kendal Franky SQUIBB, MD;  Location: [facility] OR;  Service: Orthopedics;  Laterality: Right;   OVARIAN CYST REMOVAL     TONSILLECTOMY     Family History  Problem Relation Age of Onset   Hypertension Mother    Arthritis Mother    Stroke Father    Hypertension Father    Atrial fibrillation Father    Heart disease Father    Diabetes Sister    CAD Brother    Asthma Brother    Anxiety disorder Daughter    ADD / ADHD Son    Depression Son    Asthma Son    Hypertension Maternal Uncle    Liver disease Neg Hx    Esophageal cancer Neg Hx    Colon cancer Neg Hx    Social History   Occupational History   Occupation: [employer] - stays at home now   Occupation: retired  Tobacco Use   Smoking status: Never   Smokeless tobacco: Never   Tobacco comments:    Never smoked 09/20/23  Vaping Use   Vaping status: Never Used  Substance and Sexual Activity   Alcohol use: Not Currently    Alcohol/week: 1.0 standard drink of alcohol    Types: 1 Standard drinks or equivalent per week    Comment: NO   Drug use: Not Currently    Frequency: 1.0 times per week    Types: Benzodiazepines, Hydrocodone     Comment: NO   Sexual activity: Yes    Birth control/protection: Post-menopausal    Comment: NO   Tobacco Counseling Counseling given: Not Answered Tobacco comments: Never smoked 09/20/23  SDOH Screenings   Food Insecurity: No Food Insecurity (03/26/2024)  Housing: Unknown (03/26/2024)  Transportation Needs: No Transportation Needs (03/26/2024)  Utilities: Not At Risk (03/26/2024)  Alcohol Screen: Low Risk (03/26/2024)  Depression (PHQ2-9): High Risk (03/26/2024)  Financial Resource Strain: Low Risk (03/26/2024)  Physical Activity: Inactive (03/26/2024)  Social Connections: Moderately Integrated (03/26/2024)  Stress: Stress Concern Present (03/26/2024)  Tobacco Use: Low Risk (03/26/2024)  Health Literacy: Adequate Health Literacy (03/26/2024)   See flowsheets for full screening  details  Depression Screen PHQ 2 & 9 Depression Scale- Over the past 2 weeks, how often have you been bothered by any of the following problems? Little interest or pleasure in doing things: 1 Feeling down, depressed, or hopeless (PHQ Adolescent also includes...irritable): 2 PHQ-2 Total Score: 3 Trouble falling or staying asleep, or sleeping too much: 2 Feeling tired or having little energy: 2 Poor appetite or overeating (PHQ Adolescent also includes...weight loss): 2 Feeling bad about yourself - or that you are a failure or have let yourself or your family down: 2 Trouble concentrating on things, such as reading the newspaper or watching television (PHQ Adolescent also includes...like school work): 0 Moving or speaking so slowly  that other people could have noticed. Or the opposite - being so fidgety or restless that you have been moving around a lot more than usual: 0 Thoughts that you would be better off dead, or of hurting yourself in some way: 0 PHQ-9 Total Score: 11 If you checked off any problems, how difficult have these problems made it for you to do your work, take care of things at home, or get along with other people?: Somewhat difficult  Depression Treatment Depression Interventions/Treatment : Currently on Treatment     Goals Addressed             This Visit's Progress    Weight (lb) < 200 lb (90.7 kg)   172 lb 12.8 oz (78.4 kg)            Objective:    Today's Vitals   03/26/24 0934  BP: 116/62  Pulse: 64  Temp: 97.6 F (36.4 C)  TempSrc: Oral  SpO2: 97%  Weight: 172 lb 12.8 oz (78.4 kg)  Height: 5' 3 (1.6 m)   Body mass index is 30.61 kg/m.  Hearing/Vision screen Vision Screening - Comments:: Regular eye exams, [facility] Immunizations and Health Maintenance Health Maintenance  Topic Date Due   Pneumococcal Vaccine: 50+ Years (3 of 3 - PCV) 09/03/2013   COVID-19 Vaccine (5 - Pfizer risk 2025-26 season) 07/03/2024   Mammogram  02/05/2025    Medicare Annual Wellness (AWV)  03/26/2025   Colonoscopy  06/30/2026   Cervical Cancer Screening (HPV/Pap Cotest)  01/05/2029   DTaP/Tdap/Td (3 - Td or Tdap) 03/02/2030   Influenza Vaccine  Completed   Hepatitis B Vaccines 19-59 Average Risk  Completed   HPV VACCINES (No Doses Required) Completed   Hepatitis C Screening  Completed   HIV Screening  Completed   Meningococcal B Vaccine  Aged Out   Zoster Vaccines- Shingrix  Discontinued        Assessment/Plan:  This is a routine wellness examination for Ellison.  Patient Care Team: Lendia Boby CROME, NP-C as PCP - General (Family Medicine) Cindie Ole DASEN, MD (Inactive) as PCP - Electrophysiology (Cardiology) Michele Richardson, DO as PCP - Cardiology (Cardiology) Francyne Romano, MD as Referring Physician Mozingo, [name] Nattalie, NP as Nurse Practitioner (Psychiatry) Group, [facility]  I have personally reviewed and noted the following in the patients chart:   Medical and social history Use of alcohol, tobacco or illicit drugs  Current medications and supplements including opioid prescriptions. Functional ability and status Nutritional status Physical activity Advanced directives List of other physicians Hospitalizations, surgeries, and ER visits in previous 12 months Vitals Screenings to include cognitive, depression, and falls Referrals and appointments  No orders of the defined types were placed in this encounter.  In addition, I have reviewed and discussed with patient certain preventive protocols, quality metrics, and best practice recommendations. A written personalized care plan for preventive services as well as general preventive health recommendations were provided to patient.   Ardella FORBES Dawn, LPN   8/84/7973   Return in 1 year (on 03/26/2025).  After Visit Summary: (In Person-Printed) AVS printed and given to the patient  Nurse Notes: HM Addressed: Vaccines Due: pneumonia vaccine  "

## 2024-03-27 ENCOUNTER — Ambulatory Visit: Payer: Self-pay | Admitting: Family Medicine

## 2024-03-27 ENCOUNTER — Ambulatory Visit (INDEPENDENT_AMBULATORY_CARE_PROVIDER_SITE_OTHER)

## 2024-03-27 ENCOUNTER — Other Ambulatory Visit: Payer: Self-pay | Admitting: Family Medicine

## 2024-03-27 ENCOUNTER — Ambulatory Visit

## 2024-03-27 ENCOUNTER — Other Ambulatory Visit

## 2024-03-27 ENCOUNTER — Ambulatory Visit
Admission: RE | Admit: 2024-03-27 | Discharge: 2024-03-27 | Disposition: A | Discharge status: Home / Self Care | Source: Ambulatory Visit | Attending: Obstetrics and Gynecology | Admitting: Obstetrics and Gynecology

## 2024-03-27 DIAGNOSIS — Z7901 Long term (current) use of anticoagulants: Secondary | ICD-10-CM

## 2024-03-27 DIAGNOSIS — R7989 Other specified abnormal findings of blood chemistry: Secondary | ICD-10-CM

## 2024-03-27 LAB — POCT INR: INR: 2 (ref 2.0–3.0)

## 2024-03-27 LAB — T4, FREE: Free T4: 1.12 ng/dL (ref 0.60–1.60)

## 2024-03-27 LAB — TSH: TSH: 6.84 u[IU]/mL — ABNORMAL HIGH (ref 0.35–5.50)

## 2024-03-27 MED ORDER — ENOXAPARIN SODIUM 120 MG/0.8ML IJ SOSY: 120.0000 mg | PREFILLED_SYRINGE | INTRAMUSCULAR | 0 refills | Status: AC

## 2024-03-27 MED ORDER — LEVOTHYROXINE SODIUM 50 MCG PO TABS: 50.0000 ug | ORAL_TABLET | Freq: Every day | ORAL | 1 refills | Status: AC

## 2024-03-27 NOTE — Patient Instructions (Addendum)
 Pre visit review using our clinic review tool, if applicable. No additional management support is needed unless otherwise documented below in the visit note.  1/15: Take last dose of warfarin 1/16: NO warfarin, NO Lovenox  1/17: NO warfarin, inject Lovenox  once in the AM 1/18: NO warfarin, inject Lovenox  once in the AM 1/19: NO  warfarin, inject Lovenox  once in the AM BEFORE 7 AM   1/20: SURGERY; NO WARFARIN, NO LOVENOX    1/21: Take 2 tablets (10  mg) warfarin, inject Lovenox  once in the AM 1/22: Take 1 1/2 tablets (7.5 mg) warfarin, inject Lovenox  once in the AM 1/23: Take 1 1/2 tablets (7.5 mg) warfarin, inject Lovenox  once in the AM 1/24: Take 1 1/2 tablets (7.5 mg) warfarin, inject Lovenox  once in the AM 1/25: Take 1 1/2 tablets (7.5 mg) warfarin, inject Lovenox  once in the AM 1/26: Take 1 tablet (5 mg) warfarin, inject Lovenox  once in the AM 1/27: RECHECK INR; NO WARFARIN AND NO LOVENOX  UNTIL INR CHECK

## 2024-03-27 NOTE — Progress Notes (Signed)
 Indication: Afib, recurrent DVT, PE Pt will have upcoming d&c and hysteroscopy on 1/20 and is being placed on a Lovenox  bridge. Pt is in today for INR check and instructions.  1/15: Take last dose of warfarin 1/16: NO warfarin, NO Lovenox  1/17: NO warfarin, inject Lovenox  once in the AM 1/18: NO warfarin, inject Lovenox  once in the AM 1/19: NO  warfarin, inject Lovenox  once in the AM BEFORE 7 AM   1/20: SURGERY; NO WARFARIN, NO LOVENOX    1/21: Take 2 tablets (10  mg) warfarin, inject Lovenox  once in the AM 1/22: Take 1 1/2 tablets (7.5 mg) warfarin, inject Lovenox  once in the AM 1/23: Take 1 1/2 tablets (7.5 mg) warfarin, inject Lovenox  once in the AM 1/24: Take 1 1/2 tablets (7.5 mg) warfarin, inject Lovenox  once in the AM 1/25: Take 1 1/2 tablets (7.5 mg) warfarin, inject Lovenox  once in the AM 1/26: Take 1 tablet (5 mg) warfarin, inject Lovenox  once in the AM 1/27: RECHECK INR; NO WARFARIN AND NO LOVENOX  UNTIL INR CHECK  Sent in script for Lovenox  to preferred pharmacy.

## 2024-03-27 NOTE — Progress Notes (Signed)
 Spoke w/ via phone for pre-op interview---Emily Rice needs dos---- cbc, T&S        Rice results------ COVID test -----patient states asymptomatic no test needed Arrive at -------1200 NPO after MN NO Solid Food.  Clear liquids from MN until---1100 Pre-Surgery Ensure or G2:  Med rec completed Medications to take morning of surgery -----buspar , klonopin ,lamictal , lexapro ,protonix  and synthroid  Diabetic medication -----  GLP1 agonist last dose:03/22/24 GLP1 instructions:  Patient instructed no nail polish to be worn day of surgery Patient instructed to bring photo id and insurance card day of surgery Patient aware to have Driver (ride ) / caregiver    for 24 hours after surgery - Keeon (Spouse) Patient Special Instructions -----follow guidelines for lovenox  bridge for warfarin Pre-Op special Instructions -----  Patient verbalized understanding of instructions that were given at this phone interview. Patient denies chest pain, sob, fever, cough at the interview.

## 2024-03-27 NOTE — Progress Notes (Signed)
 Please see my note to her.  I increased her levothyroxine  from 25 mcg to 50 mcg daily.  Remember to take this on empty stomach.  She will need to have her TSH rechecked in 6 to 8 weeks

## 2024-03-31 ENCOUNTER — Ambulatory Visit (HOSPITAL_COMMUNITY): Admitting: Certified Registered"

## 2024-03-31 ENCOUNTER — Encounter (HOSPITAL_COMMUNITY): Payer: Self-pay | Admitting: Obstetrics and Gynecology

## 2024-03-31 ENCOUNTER — Other Ambulatory Visit: Payer: Self-pay

## 2024-03-31 ENCOUNTER — Ambulatory Visit (HOSPITAL_COMMUNITY)
Admission: RE | Admit: 2024-03-31 | Discharge: 2024-03-31 | Disposition: A | Discharge status: Home / Self Care | Attending: Obstetrics and Gynecology | Admitting: Obstetrics and Gynecology

## 2024-03-31 ENCOUNTER — Encounter (HOSPITAL_COMMUNITY)
Admission: RE | Disposition: A | Discharge status: Home / Self Care | Payer: Self-pay | Source: Home / Self Care | Attending: Obstetrics and Gynecology

## 2024-03-31 DIAGNOSIS — F319 Bipolar disorder, unspecified: Secondary | ICD-10-CM | POA: Diagnosis not present

## 2024-03-31 DIAGNOSIS — Z86718 Personal history of other venous thrombosis and embolism: Secondary | ICD-10-CM | POA: Diagnosis not present

## 2024-03-31 DIAGNOSIS — Z01818 Encounter for other preprocedural examination: Secondary | ICD-10-CM

## 2024-03-31 DIAGNOSIS — F418 Other specified anxiety disorders: Secondary | ICD-10-CM | POA: Diagnosis not present

## 2024-03-31 DIAGNOSIS — Z7901 Long term (current) use of anticoagulants: Secondary | ICD-10-CM | POA: Diagnosis not present

## 2024-03-31 DIAGNOSIS — Z780 Asymptomatic menopausal state: Secondary | ICD-10-CM | POA: Diagnosis not present

## 2024-03-31 DIAGNOSIS — K219 Gastro-esophageal reflux disease without esophagitis: Secondary | ICD-10-CM | POA: Diagnosis not present

## 2024-03-31 DIAGNOSIS — Z8249 Family history of ischemic heart disease and other diseases of the circulatory system: Secondary | ICD-10-CM | POA: Diagnosis not present

## 2024-03-31 DIAGNOSIS — Z8673 Personal history of transient ischemic attack (TIA), and cerebral infarction without residual deficits: Secondary | ICD-10-CM | POA: Diagnosis not present

## 2024-03-31 DIAGNOSIS — I10 Essential (primary) hypertension: Secondary | ICD-10-CM | POA: Insufficient documentation

## 2024-03-31 DIAGNOSIS — N289 Disorder of kidney and ureter, unspecified: Secondary | ICD-10-CM | POA: Insufficient documentation

## 2024-03-31 DIAGNOSIS — D250 Submucous leiomyoma of uterus: Secondary | ICD-10-CM | POA: Insufficient documentation

## 2024-03-31 DIAGNOSIS — N858 Other specified noninflammatory disorders of uterus: Secondary | ICD-10-CM | POA: Insufficient documentation

## 2024-03-31 DIAGNOSIS — Z823 Family history of stroke: Secondary | ICD-10-CM | POA: Insufficient documentation

## 2024-03-31 DIAGNOSIS — N950 Postmenopausal bleeding: Secondary | ICD-10-CM

## 2024-03-31 DIAGNOSIS — I480 Paroxysmal atrial fibrillation: Secondary | ICD-10-CM

## 2024-03-31 DIAGNOSIS — Z86711 Personal history of pulmonary embolism: Secondary | ICD-10-CM | POA: Diagnosis not present

## 2024-03-31 DIAGNOSIS — Z818 Family history of other mental and behavioral disorders: Secondary | ICD-10-CM | POA: Diagnosis not present

## 2024-03-31 DIAGNOSIS — Z01812 Encounter for preprocedural laboratory examination: Secondary | ICD-10-CM

## 2024-03-31 HISTORY — PX: HYSTEROSCOPY WITH D & C: SHX1775

## 2024-03-31 LAB — CBC
HCT: 42.1 % (ref 36.0–46.0)
Hemoglobin: 13.9 g/dL (ref 12.0–15.0)
MCH: 26.9 pg (ref 26.0–34.0)
MCHC: 33 g/dL (ref 30.0–36.0)
MCV: 81.4 fL (ref 80.0–100.0)
Platelets: 200 K/uL (ref 150–400)
RBC: 5.17 MIL/uL — ABNORMAL HIGH (ref 3.87–5.11)
RDW: 14.6 % (ref 11.5–15.5)
WBC: 7.5 K/uL (ref 4.0–10.5)
nRBC: 0 % (ref 0.0–0.2)

## 2024-03-31 LAB — TYPE AND SCREEN
ABO/RH(D): A POS
Antibody Screen: NEGATIVE

## 2024-03-31 MED ORDER — ORAL CARE MOUTH RINSE: 15.0000 mL | Freq: Once | OROMUCOSAL | Status: AC

## 2024-03-31 MED ORDER — LIDOCAINE 2% (20 MG/ML) 5 ML SYRINGE
INTRAMUSCULAR | Status: AC
  Filled 2024-03-31: qty 5

## 2024-03-31 MED ORDER — FENTANYL CITRATE (PF) 100 MCG/2ML IJ SOLN
INTRAMUSCULAR | Status: AC
  Filled 2024-03-31: qty 2

## 2024-03-31 MED ORDER — ONDANSETRON HCL 4 MG/2ML IJ SOLN
INTRAMUSCULAR | Status: AC
  Filled 2024-03-31: qty 2

## 2024-03-31 MED ORDER — SODIUM CHLORIDE 0.9 % IR SOLN
Status: DC | PRN
  Administered 2024-03-31: 635 mL

## 2024-03-31 MED ORDER — LIDOCAINE 2% (20 MG/ML) 5 ML SYRINGE
INTRAMUSCULAR | Status: DC | PRN
  Administered 2024-03-31: 60 mg via INTRAVENOUS

## 2024-03-31 MED ORDER — FENTANYL CITRATE (PF) 100 MCG/2ML IJ SOLN
25.0000 ug | INTRAMUSCULAR | Status: DC | PRN
  Administered 2024-03-31 (×2): 50 ug via INTRAVENOUS

## 2024-03-31 MED ORDER — OXYCODONE HCL 5 MG PO TABS
5.0000 mg | ORAL_TABLET | Freq: Once | ORAL | Status: AC | PRN
  Administered 2024-03-31: 5 mg via ORAL

## 2024-03-31 MED ORDER — AMISULPRIDE (ANTIEMETIC) 5 MG/2ML IV SOLN
10.0000 mg | Freq: Once | INTRAVENOUS | Status: AC | PRN
  Administered 2024-03-31: 10 mg via INTRAVENOUS

## 2024-03-31 MED ORDER — PROPOFOL 10 MG/ML IV BOLUS
INTRAVENOUS | Status: DC | PRN
  Administered 2024-03-31: 200 mg via INTRAVENOUS

## 2024-03-31 MED ORDER — POVIDONE-IODINE 10 % EX SWAB: 2.0000 | Freq: Once | CUTANEOUS | Status: DC

## 2024-03-31 MED ORDER — OXYCODONE-ACETAMINOPHEN 5-325 MG PO TABS: 1.0000 | ORAL_TABLET | Freq: Four times a day (QID) | ORAL | 0 refills | Status: AC | PRN

## 2024-03-31 MED ORDER — SODIUM CHLORIDE 0.9 % IV SOLN: INTRAVENOUS | Status: DC

## 2024-03-31 MED ORDER — BUPIVACAINE HCL (PF) 0.25 % IJ SOLN
INTRAMUSCULAR | Status: AC
  Filled 2024-03-31: qty 30

## 2024-03-31 MED ORDER — PROPOFOL 10 MG/ML IV BOLUS
INTRAVENOUS | Status: AC
  Filled 2024-03-31: qty 20

## 2024-03-31 MED ORDER — DEXAMETHASONE SOD PHOSPHATE PF 10 MG/ML IJ SOLN
INTRAMUSCULAR | Status: AC
  Filled 2024-03-31: qty 1

## 2024-03-31 MED ORDER — OXYCODONE HCL 5 MG/5ML PO SOLN: 5.0000 mg | Freq: Once | ORAL | Status: AC | PRN

## 2024-03-31 MED ORDER — EPHEDRINE SULFATE-NACL 50-0.9 MG/10ML-% IV SOSY
PREFILLED_SYRINGE | INTRAVENOUS | Status: DC | PRN
  Administered 2024-03-31 (×2): 10 mg via INTRAVENOUS

## 2024-03-31 MED ORDER — CHLORHEXIDINE GLUCONATE 0.12 % MT SOLN
OROMUCOSAL | Status: AC
  Filled 2024-03-31: qty 15

## 2024-03-31 MED ORDER — CHLORHEXIDINE GLUCONATE 0.12 % MT SOLN
15.0000 mL | Freq: Once | OROMUCOSAL | Status: AC
  Administered 2024-03-31: 15 mL via OROMUCOSAL

## 2024-03-31 MED ORDER — MIDAZOLAM HCL (PF) 2 MG/2ML IJ SOLN
INTRAMUSCULAR | Status: DC | PRN
  Administered 2024-03-31: 2 mg via INTRAVENOUS

## 2024-03-31 MED ORDER — ONDANSETRON HCL 4 MG/2ML IJ SOLN
INTRAMUSCULAR | Status: DC | PRN
  Administered 2024-03-31: 4 mg via INTRAVENOUS

## 2024-03-31 MED ORDER — DEXAMETHASONE SOD PHOSPHATE PF 10 MG/ML IJ SOLN
INTRAMUSCULAR | Status: DC | PRN
  Administered 2024-03-31: 10 mg via INTRAVENOUS

## 2024-03-31 MED ORDER — FENTANYL CITRATE (PF) 100 MCG/2ML IJ SOLN
INTRAMUSCULAR | Status: DC | PRN
  Administered 2024-03-31 (×2): 50 ug via INTRAVENOUS

## 2024-03-31 MED ORDER — OXYCODONE HCL 5 MG PO TABS
ORAL_TABLET | ORAL | Status: AC
  Filled 2024-03-31: qty 1

## 2024-03-31 MED ORDER — MIDAZOLAM HCL 2 MG/2ML IJ SOLN
INTRAMUSCULAR | Status: AC
  Filled 2024-03-31: qty 2

## 2024-03-31 MED ORDER — AMISULPRIDE (ANTIEMETIC) 5 MG/2ML IV SOLN
INTRAVENOUS | Status: AC
  Filled 2024-03-31: qty 4

## 2024-03-31 NOTE — Anesthesia Procedure Notes (Signed)
 Procedure Name: LMA Insertion Date/Time: 03/31/2024 12:15 PM  Performed by: Dianne Burnard RAMAN, CRNAPre-anesthesia Checklist: Patient identified, Patient being monitored, Timeout performed, Emergency Drugs available and Suction available Patient Re-evaluated:Patient Re-evaluated prior to induction Oxygen Delivery Method: Circle system utilized Preoxygenation: Pre-oxygenation with 100% oxygen Induction Type: IV induction Ventilation: Mask ventilation without difficulty LMA: LMA inserted LMA Size: 4.0 Tube type: Oral Number of attempts: 1 Placement Confirmation: positive ETCO2 and breath sounds checked- equal and bilateral Tube secured with: Tape Dental Injury: Teeth and Oropharynx as per pre-operative assessment

## 2024-03-31 NOTE — H&P (Signed)
 Emily Rice is an 64 y.o. female P2 postmenopausal with a postmenopausal vaginal bleeding here for scheduled D&C with hysteroscopy. Patient with intermittent vaginal bleeding since 2023. She describes the bleeding as spotting and mostly occurs when wiping. Patient is without any other complaints. Past medical history is also significant for need for lifelong anticoagulation due to unprovoked DVT and PE. Patient continues to experience pain with intercourse  Pertinent Gynecological History: Menses: post-menopausal Bleeding: post menopausal bleeding Contraception: none DES exposure: denies Blood transfusions: none Last mammogram:  Date: 03/27/2024 report pending Last pap: normal Date: 01/06/2024    Menstrual History: No LMP recorded (lmp unknown). Patient is postmenopausal.    Past Medical History:  Diagnosis Date   A-fib (HCC)    Allergy    Anxiety    Bipolar disorder (HCC)    Chondromalacia patellae 12/14/2003   Depression    DVT (deep vein thrombosis) in pregnancy    DVT (deep venous thrombosis) (HCC) 2001 and 2002   DVT (deep venous thrombosis) (HCC)    x 2   Enlarged pancreas 07/02/2023   Fulness of pancreatic head on CT    Esophagitis 06/06/2016   GERD (gastroesophageal reflux disease)    Hemorrhage    Hyperlipidemia    Migraines    Pulmonary embolism (HCC) 2001   S/P cholecystectomy 09/11/2012   Sickle cell anemia (HCC)    Suicidal ideation 12/28/2008   Tremor     Past Surgical History:  Procedure Laterality Date   ATRIAL FIBRILLATION ABLATION N/A 12/04/2023   Procedure: ATRIAL FIBRILLATION ABLATION;  Surgeon: Cindie Ole DASEN, MD;  Location: [facility];  Service: Cardiovascular;  Laterality: N/A;   CHOLECYSTECTOMY  2014   CHOLECYSTECTOMY     HUMERUS IM NAIL Right 03/03/2020   Procedure: INTRAMEDULLARY (IM) NAIL HUMERAL;  Surgeon: Kendal Franky SQUIBB, MD;  Location: [facility] OR;  Service: Orthopedics;  Laterality: Right;   OVARIAN CYST REMOVAL      TONSILLECTOMY      Family History  Problem Relation Age of Onset   Hypertension Mother    Arthritis Mother    Stroke Father    Hypertension Father    Atrial fibrillation Father    Heart disease Father    Diabetes Sister    CAD Brother    Asthma Brother    Anxiety disorder Daughter    ADD / ADHD Son    Depression Son    Asthma Son    Hypertension Maternal Uncle    Liver disease Neg Hx    Esophageal cancer Neg Hx    Colon cancer Neg Hx     Social History:  reports that she has never smoked. She has never used smokeless tobacco. She reports that she does not currently use alcohol after a past usage of about 1.0 standard drink of alcohol per week. She reports that she does not currently use drugs after having used the following drugs: Benzodiazepines and Hydrocodone . Frequency: 1.00 time per week.  Allergies: Allergies[1]  Medications Prior to Admission  Medication Sig Dispense Refill Last Dose/Taking   acetaminophen  (TYLENOL ) 325 MG tablet Take 2 tablets (650 mg total) by mouth every 6 (six) hours as needed for mild pain or moderate pain. (Patient taking differently: Take 650 mg by mouth as needed for mild pain (pain score 1-3) or moderate pain (pain score 4-6).)   Past Week   busPIRone  (BUSPAR ) 30 MG tablet TAKE 1 TABLET TWICE DAILY 180 tablet 1 03/30/2024   calcium  carbonate (TUMS) 500 MG  chewable tablet Chew 1 tablet (200 mg of elemental calcium  total) by mouth daily. 90 tablet 3 03/30/2024   cetirizine (ZYRTEC) 10 MG tablet Take 10 mg by mouth daily as needed for allergies.   Past Week   Cholecalciferol  75 MCG (3000 UT) TABS Take 3,000 Units by mouth daily. 30 tablet 0 03/30/2024   clonazePAM  (KLONOPIN ) 0.5 MG tablet Take 1 tablet (0.5 mg total) by mouth 2 (two) times daily. 60 tablet 0 03/30/2024   enoxaparin  (LOVENOX ) 120 MG/0.8ML injection Inject 0.8 mLs (120 mg total) into the skin daily. TAKE AS DIRECTED BY ANTICOAGULATION CLINIC 7.2 mL 0 03/30/2024   escitalopram  (LEXAPRO ) 10  MG tablet TAKE 1 TABLET AT BEDTIME 90 tablet 1 03/30/2024   fluticasone  (FLONASE ) 50 MCG/ACT nasal spray Place 1 spray into both nostrils daily as needed for allergies.    03/30/2024   lamoTRIgine  (LAMICTAL ) 200 MG tablet TAKE 1 TABLET TWICE DAILY 180 tablet 1 03/30/2024   levothyroxine  (SYNTHROID ) 50 MCG tablet Take 1 tablet (50 mcg total) by mouth daily. 30 tablet 1 03/30/2024   losartan  (COZAAR ) 25 MG tablet Take 1 tablet (25 mg total) by mouth daily. 90 tablet 3 03/30/2024   lumateperone  tosylate (CAPLYTA ) 42 MG capsule Take 1 capsule (42 mg total) by mouth daily. 90 capsule 0 03/30/2024   metFORMIN  (GLUCOPHAGE -XR) 500 MG 24 hr tablet Take 1 tablet (500 mg total) by mouth 2 (two) times daily with a meal. 180 tablet 0 03/30/2024   Multiple Vitamin (MULTIVITAMIN WITH MINERALS) TABS tablet Take 1 tablet by mouth daily.   03/30/2024   pantoprazole  (PROTONIX ) 40 MG tablet Take 1 tablet (40 mg total) by mouth daily. 90 tablet 1 03/30/2024   potassium chloride  SA (KLOR-CON  M) 20 MEQ tablet Take 1 tablet (20 mEq total) by mouth daily. 90 tablet 3 03/30/2024   rizatriptan  (MAXALT -MLT) 10 MG disintegrating tablet Take 1 tablet (10 mg total) by mouth as needed. May repeat in 2 hours if needed 10 tablet 6 03/30/2024   traZODone  (DESYREL ) 50 MG tablet Take one to two tablets at bedtime. 60 tablet 2 03/30/2024   warfarin (COUMADIN ) 5 MG tablet TAKE 1 TABLET (5 MG) BY MOUTH DAILY EXCEPT TAKE 1 AND 1/2 TABLETS (7.5 MG) ON WEDNESD AND SUNDAY OR AS DIRECTED BY ANTICOAGULATION CLINIC 110 tablet 1 03/30/2024   acetaminophen  (TYLENOL ) 650 MG CR tablet Take 1,300 mg by mouth every 8 (eight) hours as needed for pain. (Patient taking differently: Take 1,300 mg by mouth as needed for pain.)      diltiazem  (CARDIZEM ) 30 MG tablet TAKE 1 TABLET EVERY 4 HOURS AS NEEDED FOR AFIB HEART RATE >95 AS LONG AS TOP BP >100. (Patient not taking: No sig reported) 90 tablet 1    fexofenadine (ALLEGRA) 180 MG tablet Take 180 mg by mouth as needed  for allergies or rhinitis.   More than a month   ibuprofen (ADVIL) 200 MG tablet Take 800 mg by mouth as needed.   More than a month   Insulin  Pen Needle (B-D UF III MINI PEN NEEDLES) 31G X 5 MM MISC 1 Pen by Does not apply route daily. 30 each 0    losartan  (COZAAR ) 25 MG tablet Take 1 tablet (25 mg total) by mouth daily. 90 tablet 3    ondansetron  (ZOFRAN -ODT) 4 MG disintegrating tablet Take 1 tablet (4 mg total) by mouth every 8 (eight) hours as needed. 20 tablet 6    polyethylene glycol (MIRALAX  / GLYCOLAX ) 17 g packet Take 17  g by mouth 2 (two) times daily. (Patient taking differently: Take 17 g by mouth as needed for moderate constipation.) 14 each 0 More than a month   QUEtiapine  (SEROQUEL ) 50 MG tablet TAKE 1 TO 2 TABLETS AT BEDTIME (Patient not taking: No sig reported) 180 tablet 1 Not Taking   Semaglutide ,0.25 or 0.5MG /DOS, 2 MG/3ML SOPN Inject 0.5 mg into the skin once a week. 3 mL 0 03/22/2024    Review of Systems See pertinent in HPI. All other systems reviewed and non contributory Blood pressure 124/86, pulse 61, temperature 98 F (36.7 C), temperature source Oral, resp. rate 17, height 5' 3 (1.6 m), weight 76.2 kg, SpO2 99%. Physical Exam GENERAL: Well-developed, well-nourished female in no acute distress.  LUNGS: Clear to auscultation bilaterally.  HEART: Regular rate and rhythm. ABDOMEN: Soft, nontender, nondistended. No organomegaly. PELVIC: Deferred to OR EXTREMITIES: No cyanosis, clubbing, or edema, 2+ distal pulses.  No results found. However, due to the size of the patient record, not all encounters were searched. Please check Results Review for a complete set of results.  No results found.  Assessment/Plan: 64 yo P2 with postmenopausal vaginal bleeding here for scheduled dilatation and curretage with hysteroscopy - Risks, benefits and alternatives were reviewed including but not limited to risks of bleeding, infection, uterine perforation and damage to adjacent  organs.  - Patient verbalized understanding and all questions were answered  Holiday Mcmenamin 03/31/2024, 11:55 AM     [1]  Allergies Allergen Reactions   Hydromorphone  Other (See Comments)    Stroke-like symptoms. treated with Narcan; Tolerated Percocet in the past.   Iodinated Contrast Media Other (See Comments)    Reaction not fully known   Prochlorperazine Swelling and Other (See Comments)    Compazine - tongue swelling

## 2024-03-31 NOTE — Anesthesia Preprocedure Evaluation (Signed)
"                                    Anesthesia Evaluation  Patient identified by MRN, date of birth, ID band Patient awake    Reviewed: Allergy & Precautions, H&P , NPO status , Patient's Chart, lab work & pertinent test results  Airway Mallampati: II  TM Distance: >3 FB Neck ROM: Full    Dental  (+) Dental Advisory Given   Pulmonary neg pulmonary ROS   Pulmonary exam normal breath sounds clear to auscultation       Cardiovascular hypertension, Normal cardiovascular exam Rhythm:Regular Rate:Normal     Neuro/Psych  Headaches  Anxiety Depression Bipolar Disorder   CVA  negative psych ROS   GI/Hepatic Neg liver ROS,GERD  ,,  Endo/Other  negative endocrine ROS    Renal/GU Renal InsufficiencyRenal disease  negative genitourinary   Musculoskeletal negative musculoskeletal ROS (+)    Abdominal   Peds negative pediatric ROS (+)  Hematology negative hematology ROS (+)   Anesthesia Other Findings   Reproductive/Obstetrics negative OB ROS                              Anesthesia Physical Anesthesia Plan  ASA: 3  Anesthesia Plan: General   Post-op Pain Management:    Induction: Intravenous  PONV Risk Score and Plan: 3 and Ondansetron , Dexamethasone , Midazolam  and Treatment may vary due to age or medical condition  Airway Management Planned: LMA  Additional Equipment:   Intra-op Plan:   Post-operative Plan: Extubation in OR  Informed Consent: I have reviewed the patients History and Physical, chart, labs and discussed the procedure including the risks, benefits and alternatives for the proposed anesthesia with the patient or authorized representative who has indicated his/her understanding and acceptance.     Dental advisory given  Plan Discussed with: CRNA  Anesthesia Plan Comments:         Anesthesia Quick Evaluation  "

## 2024-03-31 NOTE — Anesthesia Postprocedure Evaluation (Signed)
"   Anesthesia Post Note  Patient: Emily Rice  Procedure(s) Performed: DILATATION AND CURETTAGE /HYSTEROSCOPY (Uterus)     Patient location during evaluation: PACU Anesthesia Type: General Level of consciousness: awake and alert Pain management: pain level controlled Vital Signs Assessment: post-procedure vital signs reviewed and stable Respiratory status: spontaneous breathing, nonlabored ventilation, respiratory function stable and patient connected to nasal cannula oxygen Cardiovascular status: blood pressure returned to baseline and stable Postop Assessment: no apparent nausea or vomiting Anesthetic complications: no   No notable events documented.  Last Vitals:  Vitals:   03/31/24 1315 03/31/24 1330  BP: (!) 100/53 (!) 103/50  Pulse: (!) 56 63  Resp: 15 20  Temp:  (!) 36.3 C  SpO2: 98% 98%    Last Pain:  Vitals:   03/31/24 1322  TempSrc:   PainSc: 4                  Yesmin Mutch S      "

## 2024-03-31 NOTE — Transfer of Care (Signed)
 Immediate Anesthesia Transfer of Care Note  Patient: Emily Rice  Procedure(s) Performed: DILATATION AND CURETTAGE /HYSTEROSCOPY (Uterus)  Patient Location: PACU  Anesthesia Type:General  Level of Consciousness: awake, alert , and oriented  Airway & Oxygen Therapy: Patient Spontanous Breathing  Post-op Assessment: Report given to RN and Post -op Vital signs reviewed and stable  Post vital signs: Reviewed and stable  Last Vitals:  Vitals Value Taken Time  BP 102/51 03/31/24 12:47  Temp 36.3 C 03/31/24 12:45  Pulse 67 03/31/24 12:48  Resp 22 03/31/24 12:50  SpO2 100 % 03/31/24 12:48  Vitals shown include unfiled device data.  Last Pain:  Vitals:   03/31/24 1245  TempSrc:   PainSc: Asleep      Patients Stated Pain Goal: 3 (03/31/24 1149)  Complications: No notable events documented.

## 2024-03-31 NOTE — Op Note (Signed)
 PREOPERATIVE DIAGNOSIS:  Postmenopausal uterine bleeding. POSTOPERATIVE DIAGNOSIS: The same PROCEDURE: Hysteroscopy, Dilation and Curettage. SURGEON:  Dr. Winton Felt   INDICATIONS: 64 y.o. G2P0  here for scheduled surgery for postmenopausal vaginal bleeding.   Risks of surgery were discussed with the patient including but not limited to: bleeding which may require transfusion; infection which may require antibiotics; injury to uterus or surrounding organs; intrauterine scarring which may impair future fertility; need for additional procedures including laparotomy or laparoscopy; and other postoperative/anesthesia complications. Written informed consent was obtained.    FINDINGS:  An 8- week size uterus.  Atrophic endometrium.  Normal ostia bilaterally with small submucosal fibroid measuring less than 1 cm  ANESTHESIA:   General INTRAVENOUS FLUIDS:  500 ml of LR FLUID DEFICITS:  40 ml of Normal saline ESTIMATED BLOOD LOSS:  Less than 20 ml SPECIMENS: Endometrial curettings sent to pathology COMPLICATIONS:  None immediate.  PROCEDURE DETAILS:  The patient was taken to the operating room where general anesthesia was administered and was found to be adequate.  After an adequate timeout was performed, she was placed in the dorsal lithotomy position and examined; then prepped and draped in the sterile manner.   A speculum was then placed in the patient's vagina and a single tooth tenaculum was applied to the anterior lip of the cervix.   The cervix was sounded to 8 cm and dilated manually with Hagar dilators to accommodate the 5 mm diagnostic hysteroscope.  Once the cervix was dilated, the hysteroscope was inserted under direct visualization using normal saline as a suspension medium.  The uterine cavity was carefully examined, both ostia were recognized along with the above mentioned findings.   After further careful visualization of the uterine cavity, the hysteroscope was removed under direct  visualization.  A sharp curettage was then performed to obtain a moderate amount of endometrial curettings.  The tenaculum was removed from the anterior lip of the cervix and the vaginal speculum was removed after noting good hemostasis.  The patient tolerated the procedure well and was taken to the recovery area awake, extubated and in stable condition.

## 2024-04-01 ENCOUNTER — Telehealth: Admitting: Adult Health

## 2024-04-01 ENCOUNTER — Encounter (HOSPITAL_COMMUNITY): Payer: Self-pay | Admitting: Obstetrics and Gynecology

## 2024-04-01 LAB — SURGICAL PATHOLOGY

## 2024-04-02 ENCOUNTER — Ambulatory Visit (INDEPENDENT_AMBULATORY_CARE_PROVIDER_SITE_OTHER): Admitting: Internal Medicine

## 2024-04-02 ENCOUNTER — Ambulatory Visit: Admitting: Family Medicine

## 2024-04-02 ENCOUNTER — Ambulatory Visit: Payer: Self-pay | Admitting: Obstetrics and Gynecology

## 2024-04-02 MED ORDER — ESTRADIOL 0.01 % VA CREA: TOPICAL_CREAM | VAGINAL | 12 refills | Status: AC

## 2024-04-06 ENCOUNTER — Ambulatory Visit (INDEPENDENT_AMBULATORY_CARE_PROVIDER_SITE_OTHER): Admitting: Internal Medicine

## 2024-04-07 ENCOUNTER — Ambulatory Visit

## 2024-04-07 DIAGNOSIS — Z7901 Long term (current) use of anticoagulants: Secondary | ICD-10-CM | POA: Diagnosis not present

## 2024-04-07 LAB — POCT INR: INR: 1.8 — AB (ref 2.0–3.0)

## 2024-04-07 NOTE — Patient Instructions (Addendum)
 Pre visit review using our clinic review tool, if applicable. No additional management support is needed unless otherwise documented below in the visit note.  Stop Lovenox  injections. Increase dose today to take 1 1/2 tablets and then change weekly dose to take 1 tablet daily except take 1 1/2 tablets on Sunday, Wednesday and Friday. Recheck in 2 weeks.

## 2024-04-07 NOTE — Progress Notes (Signed)
 Indication: Afib, recurrent DVT, PE Pt had DILATATION AND CURETTAGE /HYSTEROSCOPY on 1/20 and was placed on a Lovenox  bridge. Pt reports she was advised by the surgeon's office to take 800 mg ibuprofen in between the percocet they prescribed for pain. Ibuprofen should not be taken with warfarin due to interaction and can cause increase in INR. Pt reports for the last 3 days she has been taking ibuprofen, 800 mg, TID, but she is still having pain. She reports she will contact the surgeon's office today. Advised pt to stop using ibuprofen and use Tylenol  instead. Pt verbalized understanding. Pt reports she has not been eating much either and not really eating any vegetables.  Pt was at the lowest end of her INR range at last INR check and the time before that she was subtherapeutic. Due to these results and other factors mentioned above there will be a small change to weekly dosing to move INR closer to goal of 2.5. Will recheck INR in 1 1/2 weeks to coincide with her next f/u with surgeon.  Stop Lovenox  injections. Increase dose today to take 1 1/2 tablets and then change weekly dose to take 1 tablet daily except take 1 1/2 tablets on Sunday, Wednesday and Friday. Recheck in 2 weeks.

## 2024-04-14 ENCOUNTER — Encounter: Payer: Self-pay | Admitting: Adult Health

## 2024-04-14 ENCOUNTER — Telehealth: Admitting: Adult Health

## 2024-04-14 DIAGNOSIS — G4700 Insomnia, unspecified: Secondary | ICD-10-CM

## 2024-04-14 DIAGNOSIS — F4310 Post-traumatic stress disorder, unspecified: Secondary | ICD-10-CM

## 2024-04-14 DIAGNOSIS — F411 Generalized anxiety disorder: Secondary | ICD-10-CM

## 2024-04-14 DIAGNOSIS — F310 Bipolar disorder, current episode hypomanic: Secondary | ICD-10-CM

## 2024-04-14 MED ORDER — LUMATEPERONE TOSYLATE 42 MG PO CAPS: 42.0000 mg | ORAL_CAPSULE | Freq: Every day | ORAL | 2 refills | Status: AC

## 2024-04-15 ENCOUNTER — Telehealth: Payer: Self-pay

## 2024-04-15 ENCOUNTER — Ambulatory Visit (INDEPENDENT_AMBULATORY_CARE_PROVIDER_SITE_OTHER): Admitting: Internal Medicine

## 2024-04-15 ENCOUNTER — Encounter (INDEPENDENT_AMBULATORY_CARE_PROVIDER_SITE_OTHER): Payer: Self-pay

## 2024-04-15 NOTE — Telephone Encounter (Signed)
 Called pharmacy, they don't have it stock, should be in by tomorrow. They can't run it yet to see how much it will be until medication arrives. Pharmacy will call us  tomorrow once delivery is made. Hoping it comes in today, but shooting for tomorrow.

## 2024-04-15 NOTE — Telephone Encounter (Signed)
 PA approved 03/12/24-03/11/25 Caplyta  42 mg  Emily Rice, please check with her pharmacy on cost of Caplyta . Pt does have Medicare so she is not able to use a savings card.

## 2024-04-15 NOTE — Telephone Encounter (Signed)
 Pt reports she is requesting to RS her coumadin  clinic apt for 2/6 to 2/10.  RS apt per pt request.

## 2024-04-16 ENCOUNTER — Telehealth: Payer: Self-pay | Admitting: Adult Health

## 2024-04-16 ENCOUNTER — Other Ambulatory Visit: Payer: Self-pay | Admitting: Adult Health

## 2024-04-16 DIAGNOSIS — G4700 Insomnia, unspecified: Secondary | ICD-10-CM

## 2024-04-16 NOTE — Telephone Encounter (Signed)
 Please see message from patient. She was seen 2 days ago.

## 2024-04-16 NOTE — Telephone Encounter (Signed)
 Next visit is 04/14/24. Emily Rice states she is having a lot of anxiety and can't sit still. She has been taking 1-2 day and needs to take extra. She would like to take one more Klonopin  with the other ones she is taking. Phone number is [phone].

## 2024-04-17 ENCOUNTER — Encounter: Payer: Self-pay | Admitting: Obstetrics and Gynecology

## 2024-04-17 ENCOUNTER — Ambulatory Visit

## 2024-04-21 ENCOUNTER — Ambulatory Visit

## 2024-05-04 ENCOUNTER — Encounter: Admitting: Obstetrics and Gynecology

## 2024-05-11 ENCOUNTER — Other Ambulatory Visit

## 2024-08-11 ENCOUNTER — Ambulatory Visit: Admitting: Family Medicine

## 2025-01-06 ENCOUNTER — Encounter: Admitting: Family Medicine

## 2025-03-29 ENCOUNTER — Ambulatory Visit
